# Patient Record
Sex: Female | Born: 1975
Health system: Southern US, Community
[De-identification: ages and names within clinical notes are randomized; demographics above are authoritative.]

## PROBLEM LIST (undated history)

## (undated) DIAGNOSIS — D573 Sickle-cell trait: Secondary | ICD-10-CM

## (undated) DIAGNOSIS — J45909 Unspecified asthma, uncomplicated: Secondary | ICD-10-CM

## (undated) DIAGNOSIS — D500 Iron deficiency anemia secondary to blood loss (chronic): Secondary | ICD-10-CM

## (undated) DIAGNOSIS — D649 Anemia, unspecified: Secondary | ICD-10-CM

## (undated) DIAGNOSIS — M1990 Unspecified osteoarthritis, unspecified site: Secondary | ICD-10-CM

## (undated) DIAGNOSIS — I10 Essential (primary) hypertension: Secondary | ICD-10-CM

## (undated) DIAGNOSIS — T7840XA Allergy, unspecified, initial encounter: Secondary | ICD-10-CM

## (undated) DIAGNOSIS — E669 Obesity, unspecified: Secondary | ICD-10-CM

## (undated) DIAGNOSIS — E88819 Insulin resistance, unspecified: Secondary | ICD-10-CM

## (undated) DIAGNOSIS — D219 Benign neoplasm of connective and other soft tissue, unspecified: Secondary | ICD-10-CM

## (undated) DIAGNOSIS — F32A Depression, unspecified: Secondary | ICD-10-CM

## (undated) DIAGNOSIS — R7303 Prediabetes: Secondary | ICD-10-CM

## (undated) HISTORY — DX: Unspecified osteoarthritis, unspecified site: M19.90

## (undated) HISTORY — DX: Benign neoplasm of connective and other soft tissue, unspecified: D21.9

## (undated) HISTORY — DX: Iron deficiency anemia secondary to blood loss (chronic): D50.0

## (undated) HISTORY — DX: Allergy, unspecified, initial encounter: T78.40XA

## (undated) HISTORY — DX: Unspecified asthma, uncomplicated: J45.909

## (undated) HISTORY — DX: Prediabetes: R73.03

## (undated) HISTORY — DX: Obesity, unspecified: E66.9

## (undated) HISTORY — DX: Anemia, unspecified: D64.9

## (undated) HISTORY — DX: Depression, unspecified: F32.A

## (undated) HISTORY — DX: Essential (primary) hypertension: I10

## (undated) HISTORY — DX: Insulin resistance, unspecified: E88.819

## (undated) HISTORY — PX: OTHER SURGICAL HISTORY: SHX169

## (undated) HISTORY — DX: Sickle-cell trait: D57.3

---

## 1995-11-10 HISTORY — PX: TONSILLECTOMY: SUR1361

## 2000-06-04 ENCOUNTER — Other Ambulatory Visit: Admission: RE | Admit: 2000-06-04 | Discharge: 2000-06-04 | Payer: Self-pay | Admitting: Internal Medicine

## 2001-12-26 ENCOUNTER — Other Ambulatory Visit: Admission: RE | Admit: 2001-12-26 | Discharge: 2001-12-26 | Payer: Self-pay | Admitting: Obstetrics and Gynecology

## 2003-01-12 ENCOUNTER — Other Ambulatory Visit: Admission: RE | Admit: 2003-01-12 | Discharge: 2003-01-12 | Payer: Self-pay | Admitting: Internal Medicine

## 2003-08-10 ENCOUNTER — Other Ambulatory Visit: Admission: RE | Admit: 2003-08-10 | Discharge: 2003-08-10 | Payer: Self-pay | Admitting: Obstetrics and Gynecology

## 2004-01-30 ENCOUNTER — Inpatient Hospital Stay (HOSPITAL_COMMUNITY): Admission: AD | Admit: 2004-01-30 | Discharge: 2004-02-01 | Payer: Self-pay | Admitting: Obstetrics and Gynecology

## 2004-02-02 ENCOUNTER — Encounter: Admission: RE | Admit: 2004-02-02 | Discharge: 2004-03-03 | Payer: Self-pay | Admitting: Obstetrics and Gynecology

## 2004-03-04 ENCOUNTER — Encounter: Admission: RE | Admit: 2004-03-04 | Discharge: 2004-04-03 | Payer: Self-pay | Admitting: Obstetrics and Gynecology

## 2004-03-06 ENCOUNTER — Other Ambulatory Visit: Admission: RE | Admit: 2004-03-06 | Discharge: 2004-03-06 | Payer: Self-pay | Admitting: Obstetrics and Gynecology

## 2009-03-25 ENCOUNTER — Encounter: Payer: Self-pay | Admitting: Gastroenterology

## 2009-11-27 ENCOUNTER — Encounter (INDEPENDENT_AMBULATORY_CARE_PROVIDER_SITE_OTHER): Payer: Self-pay | Admitting: *Deleted

## 2009-11-27 ENCOUNTER — Telehealth: Payer: Self-pay | Admitting: Gastroenterology

## 2009-12-19 ENCOUNTER — Telehealth: Payer: Self-pay | Admitting: Gastroenterology

## 2009-12-19 ENCOUNTER — Ambulatory Visit: Payer: Self-pay | Admitting: Gastroenterology

## 2009-12-19 DIAGNOSIS — R1013 Epigastric pain: Secondary | ICD-10-CM | POA: Insufficient documentation

## 2009-12-20 ENCOUNTER — Ambulatory Visit: Payer: Self-pay | Admitting: Gastroenterology

## 2009-12-25 ENCOUNTER — Encounter: Payer: Self-pay | Admitting: Gastroenterology

## 2009-12-25 ENCOUNTER — Telehealth: Payer: Self-pay | Admitting: Gastroenterology

## 2009-12-25 DIAGNOSIS — R112 Nausea with vomiting, unspecified: Secondary | ICD-10-CM | POA: Insufficient documentation

## 2009-12-25 DIAGNOSIS — R141 Gas pain: Secondary | ICD-10-CM | POA: Insufficient documentation

## 2009-12-25 DIAGNOSIS — R142 Eructation: Secondary | ICD-10-CM

## 2009-12-25 DIAGNOSIS — R143 Flatulence: Secondary | ICD-10-CM

## 2009-12-27 ENCOUNTER — Ambulatory Visit (HOSPITAL_COMMUNITY): Admission: RE | Admit: 2009-12-27 | Discharge: 2009-12-27 | Payer: Self-pay | Admitting: Gastroenterology

## 2009-12-27 ENCOUNTER — Telehealth: Payer: Self-pay | Admitting: Gastroenterology

## 2010-01-14 ENCOUNTER — Ambulatory Visit: Payer: Self-pay | Admitting: Gastroenterology

## 2010-04-16 ENCOUNTER — Emergency Department (HOSPITAL_COMMUNITY): Admission: EM | Admit: 2010-04-16 | Discharge: 2010-04-16 | Payer: Self-pay | Admitting: Emergency Medicine

## 2010-12-09 NOTE — Letter (Signed)
Summary: [facility]  [facility]   Imported By: Sherian Rein 12/23/2009 14:56:38  _____________________________________________________________________  External Attachment:    Type:   Image     Comment:   External Document

## 2010-12-09 NOTE — Procedures (Signed)
Summary: Upper Endoscopy  Patient: Heidi Robinson Note: All result statuses are Final unless otherwise noted.  Tests: (1) Upper Endoscopy (EGD)   EGD Upper Endoscopy       DONE     [facility]     [address]     [place], [place]  [address]           ENDOSCOPY PROCEDURE REPORT           PATIENT:  Heidi, Robinson  MR#:  [id number]     BIRTHDATE:  07/29/1976, 33 yrs. old  GENDER:  female           ENDOSCOPIST:  Barbette Hair. Arlyce Dice, MD     Referred by:           PROCEDURE DATE:  12/20/2009     PROCEDURE:  EGD, diagnostic     ASA CLASS:  Class I     INDICATIONS:  abdominal pain           MEDICATIONS:   Fentanyl 50 mcg IV, Versed 7 mg IV, glycopyrrolate     (Robinal) 0.2 mg IV, 0.6cc simethancone 0.6 cc PO     TOPICAL ANESTHETIC:  Exactacain Spray           DESCRIPTION OF PROCEDURE:   After the risks benefits and     alternatives of the procedure were thoroughly explained, informed     consent was obtained.  The LB GIF-H180 [id number] endoscope was     introduced through the mouth and advanced to the third portion of     the duodenum, without limitations.  The instrument was slowly     withdrawn as the mucosa was fully examined.     <<PROCEDUREIMAGES>>           The upper, middle, and distal third of the esophagus were     carefully inspected and no abnormalities were noted. The z-line     was well seen at the GEJ. The endoscope was pushed into the fundus     which was normal including a retroflexed view. The antrum,gastric     body, first and second part of the duodenum were unremarkable (see     image1, image2, image3, image4, image5, image6, and image7).     Retroflexed views revealed no abnormalities.    The scope was then     withdrawn from the patient and the procedure completed.     COMPLICATIONS:  None           ENDOSCOPIC IMPRESSION:     1) Normal EGD     RECOMMENDATIONS:     1) My office will arrange for you to have an abdominal     ultrasound performed.     2) Call  office next 2-3 days to schedule an office appointment     for 3 weeks     3)  Hyomax as needed for pain           REPEAT EXAM:  No           ______________________________     Barbette Hair. Arlyce Dice, MD           cc: [facility]           n.     eSIGNED:   Barbette Hair. Keela Rubert at 12/20/2009 04:54 PM           Heidi Robinson, [id number]  Note: An exclamation mark (!) indicates a result that was not dispersed into  the flowsheet. Document Creation Date: 12/20/2009 4:54 PM _______________________________________________________________________  (1) Order result status: Final Collection or observation date-time: 12/20/2009 16:50 Requested date-time:  Receipt date-time:  Reported date-time:  Referring Physician:   Ordering Physician: Melvia Heaps [phone]) Specimen Source:  Source: Launa Grill Order Number: [id number] Lab site:

## 2010-12-09 NOTE — Progress Notes (Signed)
Summary: ? about EGD  Phone Note Call from Patient Call back at Home Phone [phone]   Summary of Call: Pt contacted me with concerns of why she has to have an upper endoscopy before the U/S. Explained to her that Dr Arlyce Dice has a concern with her having black stools and her previous hx of asprin use. An EGD will detect if she has an ulcer. If EGD is neg then Dr Arlyce Dice will proceed with the abdominal U/S to rull out gallstones. Told pt to return call if she has any further questions Initial call taken by: Merri Ray CMA Duncan Dull),  December 19, 2009 4:13 PM

## 2010-12-09 NOTE — Letter (Signed)
Summary: Appt Reminder 2  [facility]  [address] [place], [place] [address]   Phone: [phone]  Fax: [phone]        December 25, 2009 MRN: [id number]    [facility] [address] [place], [place]  [address]    Dear Ms. Heidi Robinson,   You have a return appointment with Dr.Robert Arlyce Dice on 01-14-10 at 4:15pm, please arrive by 4pm to register. Please remember to bring a complete list of the medicines you are taking, your insurance card and your co-pay.  If you have to cancel or reschedule this appointment, please call before 5:00 pm the evening before to avoid a cancellation fee.  If you have any questions or concerns, please call [phone].    Sincerely,    Laureen Ochs LPN  Appended Document: Appt Reminder 2 Letter mailed to patient.

## 2010-12-09 NOTE — Letter (Signed)
Summary: EGD Instructions  [facility]  [address] [place], [place] [address]   Phone: [phone]  Fax: [phone]       Heidi Robinson    [birth year]    MRN: [id number]       Procedure Day /Date:FRIDAY 12/20/2009     Arrival Time: 3PM     Procedure Time:4PM     Location of Procedure:                    X  [facility] (4th Floor)    PREPARATION FOR ENDOSCOPY   On 12/20/2009  THE DAY OF THE PROCEDURE:  1.   No solid foods, milk or milk products are allowed after midnight the night before your procedure.  2.   Do not drink anything colored red or purple.  Avoid juices with pulp.  No orange juice.  3.  You may drink clear liquids until 2PM, which is 2 hours before your procedure.                                                                                                CLEAR LIQUIDS INCLUDE: Water Jello Ice Popsicles Tea (sugar ok, no milk/cream) Powdered fruit flavored drinks Coffee (sugar ok, no milk/cream) Gatorade Juice: apple, white grape, white cranberry  Lemonade Clear bullion, consomm, broth Carbonated beverages (any kind) Strained chicken noodle soup Hard Candy   MEDICATION INSTRUCTIONS  Unless otherwise instructed, you should take regular prescription medications with a small sip of water as early as possible the morning of your procedure.            OTHER INSTRUCTIONS  You will need a responsible adult at least 35 years of age to accompany you and drive you home.   This person must remain in the waiting room during your procedure.  Wear loose fitting clothing that is easily removed.  Leave jewelry and other valuables at home.  However, you may wish to bring a book to read or an iPod/MP3 player to listen to music as you wait for your procedure to start.  Remove all body piercing jewelry and leave at home.  Total time from sign-in until discharge is approximately 2-3 hours.  You should go home directly after your  procedure and rest.  You can resume normal activities the day after your procedure.  The day of your procedure you should not:   Drive   Make legal decisions   Operate machinery   Drink alcohol   Return to work  You will receive specific instructions about eating, activities and medications before you leave.    The above instructions have been reviewed and explained to me by   _______________________    I fully understand and can verbalize these instructions _____________________________ Date _________

## 2010-12-09 NOTE — Progress Notes (Signed)
Summary: Ultrasound/REV Scheduled  Phone Note Outgoing Call Call back at [facility] Phone [phone] Call back at [phone]   Call placed by: Laureen Ochs LPN,  December 25, 2009 12:26 PM Call placed to: Patient Summary of Call: Pt. is scheduled for an Ultrasound at [facility] on 12-27-09 at 9am (NPO after 12mn) and an REV on 01-14-10 at 4:15pm. All instructions reviewed w/pt. by phone. Pt. instructed to call back as needed.    Initial call taken by: Laureen Ochs LPN,  December 25, 2009 12:31 PM  New Problems: ABDOMINAL BLOATING (ICD-787.3) NAUSEA WITH VOMITING (ICD-787.01)   New Problems: ABDOMINAL BLOATING (ICD-787.3) NAUSEA WITH VOMITING (ICD-787.01)

## 2010-12-09 NOTE — Letter (Signed)
Summary: New Patient letter  [facility]  [address] [place], [place] [address]   Phone: [phone]  Fax: [phone]       11/27/2009 MRN: [id number]  Heidi Robinson [address] [place], [place]  [address]  Dear Ms. Jerene Pitch,  Welcome to the Gastroenterology Division at [facility].    You are scheduled to see Dr.  Melvia Heaps on December 19, 2009 at 11:00am on the 3rd floor at [facility], [address]  We ask that you try to arrive at our office 15 minutes prior to your appointment time to allow for check-in.  We would like you to complete the enclosed self-administered evaluation form prior to your visit and bring it with you on the day of your appointment.  We will review it with you.  Also, please bring a complete list of all your medications or, if you prefer, bring the medication bottles and we will list them.  Please bring your insurance card so that we may make a copy of it.  If your insurance requires a referral to see a specialist, please bring your referral form from your primary care physician.  Co-payments are due at the time of your visit and may be paid by cash, check or credit card.     Your office visit will consist of a consult with your physician (includes a physical exam), any laboratory testing he/she may order, scheduling of any necessary diagnostic testing (e.g. x-ray, ultrasound, CT-scan), and scheduling of a procedure (e.g. Endoscopy, Colonoscopy) if required.  Please allow enough time on your schedule to allow for any/all of these possibilities.    If you cannot keep your appointment, please call [phone] to cancel or reschedule prior to your appointment date.  This allows [place] the opportunity to schedule an appointment for another patient in need of care.  If you do not cancel or reschedule by 5 p.m. the business day prior to your appointment date, you will be charged a $50.00 late cancellation/no-show fee.    Thank you for choosing  [facility] for your medical needs.  We appreciate the opportunity to care for you.  Please visit [place] at our website  to learn more about our practice.                     Sincerely,                                                             The Gastroenterology Division

## 2010-12-09 NOTE — Progress Notes (Signed)
Summary: Previous GI hx  Phone Note Call from Patient Call back at [facility] Phone [phone]   Summary of Call: Wants appointment, has GI HX with Dr Elnoria Howard. Pt not happy with care at [facility] Initial call taken by: Merri Ray CMA Duncan Dull),  November 27, 2009 3:47 PM  Follow-up for Phone Call        She needs an office appointment  ---- 11/27/2009 12:30 PM, Merri Ray CMA (AAMA) wrote: Dr Arlyce Dice, There is a pt who has not been registered yet. She has seen Dr Elnoria Howard in the office  on May 2010,he wanted to schedule her a EGD with [facility] but she didnt go back. She stated she was not happy with her care there. Do you want her scheduled with you??  Or is it Ok??  Additional Follow-up for Phone Call Additional follow up Details #1::        Ok pt scheduled for 12/19/2009 Additional Follow-up by: Merri Ray CMA Duncan Dull),  November 27, 2009 3:48 PM

## 2010-12-09 NOTE — Progress Notes (Signed)
Summary: Ultrasound Results  ---- Converted from flag ---- ---- 12/27/2009 10:49 AM, Louis Meckel MD wrote: Please tell pt ulrasound is negative.  She should use hyomax aggressively for her abdominal discomfort and ROV in 3-4 weeks unless she's no better. ------------------------------  Phone Note Outgoing Call   Call placed by: Laureen Ochs LPN,  December 27, 2009 11:53 AM Call placed to: Patient Summary of Call: Above MD orders reviewed with patient. Pt. to keep scheduled office visit 01-14-10 at 4:15pm. Pt. instructed to call back as needed.  Initial call taken by: Laureen Ochs LPN,  December 27, 2009 11:54 AM

## 2010-12-09 NOTE — Assessment & Plan Note (Signed)
Summary: F/U FROM ENDO & ULTRASOUND      4:00appt      DEBORAH   History of Present Illness Primary GI MD: Melvia Heaps MD [facility] Primary Provider: [facility] Chief Complaint: Rondell Reams and abd pain still ongoing with constipation. F/u from u/s and EGD. History of Present Illness:   Ms. Offner has returned for followup of her  abdominal complaints including bloating, fullness and excess belching.  Hyomax did not impact on the symptoms.  She tends to be constipated and may have a bowel movement every 3 days.  She has a sense of incomplete evacuation.  With a complete bowel movement her bloating nausea and fullness may subside.  ]Upper endoscopy and ultrasound negative.   GI Review of Systems    Reports abdominal pain and  nausea.     Location of  Abdominal pain: generalized.    Denies acid reflux, belching, bloating, chest pain, dysphagia with liquids, dysphagia with solids, heartburn, loss of appetite, vomiting, vomiting blood, weight loss, and  weight gain.      Reports constipation.     Denies anal fissure, black tarry stools, change in bowel habit, diarrhea, diverticulosis, fecal incontinence, heme positive stool, hemorrhoids, irritable bowel syndrome, jaundice, light color stool, liver problems, rectal bleeding, and  rectal pain.    Current Medications (verified): 1)  Yasmin 28 3-0.03 Mg Tabs (Drospirenone-Ethinyl Estradiol) .... Take One By Mouth Once Daily 2)  Amlodipine Besylate 5 Mg Tabs (Amlodipine Besylate) .... Take One By Mouth Once Daily 3)  Prune X .... Take One By Mouth As Needed 4)  Multivitamins  Tabs (Multiple Vitamin) .... Take One By Mouth Once Daily 5)  Prilosec Otc 20 Mg Tbec (Omeprazole Magnesium) .... Take One By Mouth Once Daily 6)  Hyomax-Sl 0.125 Mg Subl (Hyoscyamine Sulfate) .... Take 2 Tablets Sublingual Q.4 H. P.r.n.  Allergies (verified): 1)  ! Pcn  Past History:  Past Medical History: Reviewed history from 12/19/2009 and no changes  required. GERD migraines Asthma hx of chicken pox Hypertension  Past Surgical History: Reviewed history from 12/17/2009 and no changes required. Tonsillectomy  Family History: Reviewed history from 12/19/2009 and no changes required. Family History of Prostate : Cancer: paternal grandfather Family History of Leukemia: maternal Great Grandmother Family History of Lung Cancer: Maternal Aunt (smoker)  Social History: Reviewed history from 12/19/2009 and no changes required.  married Illicit Drug Use - no 2 children Occupation: Nurse, children's  Patient has never smoked.  Alcohol Use - no Daily Caffeine Use one per day  Review of Systems  The patient denies allergy/sinus, anemia, anxiety-new, arthritis/joint pain, back pain, blood in urine, breast changes/lumps, change in vision, confusion, cough, coughing up blood, depression-new, fainting, fatigue, fever, headaches-new, hearing problems, heart murmur, heart rhythm changes, itching, menstrual pain, muscle pains/cramps, night sweats, nosebleeds, pregnancy symptoms, shortness of breath, skin rash, sleeping problems, sore throat, swelling of feet/legs, swollen lymph glands, thirst - excessive , urination - excessive , urination changes/pain, urine leakage, vision changes, and voice change.    Vital Signs:  Patient profile:   35 year old female Height:      62 inches Weight:      167.25 pounds BMI:     30.70 Pulse rate:   80 / minute Pulse rhythm:   regular BP sitting:   124 / 78  (left arm) Cuff size:   regular  Vitals Entered By: Christie Nottingham CMA Duncan Dull) (January 14, 2010 4:10 PM)   Impression & Recommendations:  Problem #  1:  ABDOMINAL BLOATING (ICD-787.3) Constellation of symptoms including bloating, fullness and nausea may be due to underlying constipation.  GERD maybe operative as well.  Recommendations #1 trial of AcipHex 20 mg a day #2 patient was instructed to take MiraLax every 3 days as needed  Patient  Instructions: 1)  We are giving you Aciphex samples today 2)  The medication list was reviewed and reconciled.  All changed / newly prescribed medications were explained.  A complete medication list was provided to the patient / caregiver.

## 2010-12-09 NOTE — Assessment & Plan Note (Signed)
Summary: nausea,reflux ,vomitting....em   History of Present Illness Visit Type: Initial Visit Primary GI MD: Melvia Heaps MD [facility] Primary Provider: [facility] Chief Complaint: Patient having some epigastric pain and nausea. She does complain of vomiting from time to time. She complains that she has dark tarry stool when she s constipated, she has had them for about a year.  History of Present Illness:   Heidi Robinson is a 35 year old Afro-American female referred from the  urgent care center for evaluation of abdominal pain.  Over the past 2 years she has been suffering from intermittent severe with episodes of pain, nausea and vomiting.  She describes a feeling of an intense desire to belch or pass gas.  She has bloating and fullness in her upper abdomen which can last up to a couple of days.  It is nonspecific to any particular foods.  She has occasional pyrosis.  She takes over-the-counter Prilosec which has decreased the frequency of these episodes though they remain.  She denies dysphagia.  She claims that she has had black solid stools.  She had been taking Excedrin migraine tablets but none in the last couple of weeks.   GI Review of Systems    Reports abdominal pain, acid reflux, belching, bloating, chest pain, nausea, and  vomiting.     Location of  Abdominal pain: generalized.    Denies dysphagia with liquids, dysphagia with solids, heartburn, loss of appetite, vomiting blood, weight loss, and  weight gain.      Reports black tarry stools and  constipation.     Denies anal fissure, change in bowel habit, diarrhea, diverticulosis, fecal incontinence, heme positive stool, hemorrhoids, irritable bowel syndrome, jaundice, light color stool, liver problems, rectal bleeding, and  rectal pain.    Current Medications (verified): 1)  Yasmin 28 3-0.03 Mg Tabs (Drospirenone-Ethinyl Estradiol) .... Take One By Mouth Once Daily 2)  Amlodipine Besylate 5 Mg Tabs (Amlodipine Besylate) .... Take  One By Mouth Once Daily 3)  Prune X .... Take One By Mouth As Needed 4)  Multivitamins  Tabs (Multiple Vitamin) .... Take One By Mouth Once Daily 5)  Prilosec Otc 20 Mg Tbec (Omeprazole Magnesium) .... Take One By Mouth Once Daily  Allergies (verified): 1)  ! Pcn  Past History:  Past Medical History: GERD migraines Asthma hx of chicken pox Hypertension  Past Surgical History: Reviewed history from 12/17/2009 and no changes required. Tonsillectomy  Family History: Family History of Prostate : Cancer: paternal grandfather Family History of Leukemia: maternal Great Grandmother Family History of Lung Cancer: Maternal Aunt (smoker)  Social History:  married Illicit Drug Use - no 2 children Occupation: Nurse, children's  Patient has never smoked.  Alcohol Use - no Daily Caffeine Use one per day  Review of Systems       The patient complains of headaches-new.  The patient denies allergy/sinus, anemia, anxiety-new, arthritis/joint pain, back pain, blood in urine, breast changes/lumps, change in vision, confusion, cough, coughing up blood, depression-new, fainting, fatigue, fever, hearing problems, heart murmur, heart rhythm changes, itching, menstrual pain, muscle pains/cramps, night sweats, nosebleeds, pregnancy symptoms, shortness of breath, skin rash, sleeping problems, sore throat, swelling of feet/legs, swollen lymph glands, thirst - excessive , urination - excessive , urination changes/pain, urine leakage, vision changes, and voice change.         All other systems were reviewed and were negative   Vital Signs:  Patient profile:   35 year old female Height:  62 inches Weight:      166.4 pounds BMI:     30.54 Pulse rate:   84 / minute Pulse rhythm:   regular BP sitting:   130 / 86  (left arm) Cuff size:   regular  Vitals Entered By: Harlow Mares CMA Duncan Dull) (December 19, 2009 11:10 AM)  Physical Exam  Additional Exam:  She is a healthy-appearing female  skin:  anicteric HEENT: normocephalic; PEERLA; no nasal or pharyngeal abnormalities neck: supple nodes: no cervical lymphadenopathy chest: clear to ausculatation and percussion heart: no murmurs, gallops, or rubs abd: soft, nontender; BS normoactive; no abdominal masses, tenderness, organomegaly rectal: deferred ext: no cynanosis, clubbing, edema skeletal: no deformities neuro: oriented x 3; no focal abnormalities    Impression & Recommendations:  Problem # 1:  ABDOMINAL PAIN, EPIGASTRIC (ICD-789.06)  Her episodes of pain could be due to ulcer or nonulcer dyspepsia.  History of black stools raises the question of active ulcer disease, especially in view of her history of aspirin use.  Pain from acute cholecystitis is also a consideration.  Recommendations #1 trial hyomax sublingual #2 upper endoscopy #3 abdominal ultrasound if endoscopy is negative   Risks, alternatives, and complications of the procedure, including bleeding, perforation, and possible need for surgery, were explained to the patient.  Patient's questions were answered.  Orders: EGD (EGD)  Patient Instructions: 1)  Conscious Sedation brochure given.  2)  Upper Endoscopy brochure given.  3)  Your EGD is scheduled for 12/20/2009 at 4pm 4)  Your new rx is being sent to your pharmacy today 5)  The medication list was reviewed and reconciled.  All changed / newly prescribed medications were explained.  A complete medication list was provided to the patient / caregiver. Prescriptions: HYOMAX-SL 0.125 MG SUBL (HYOSCYAMINE SULFATE) take 2 tablets sublingual q.4 h. p.r.n.  #15 x 2   Entered and Authorized by:   Louis Meckel MD   Signed by:   Louis Meckel MD on 12/19/2009   Method used:   Electronically to        [facility] (retail)       [address]       [place], [place]  [address]       Ph: [phone]       Fax: [phone]   RxID:   [id number]

## 2010-12-09 NOTE — Letter (Signed)
Summary: Results Letter  [facility]  [address] [place], [place] [address]   Phone: [phone]  Fax: [phone]        December 19, 2009 MRN: [id number]    [facility] [address] [place], [place]  [address]    Dear Ms. COZBY,  It is my pleasure to have treated you recently as a new patient in my office. I appreciate your confidence and the opportunity to participate in your care.  Since I do have a busy inpatient endoscopy schedule and office schedule, my office hours vary weekly. I am, however, available for emergency calls everyday through my office. If I am not available for an urgent office appointment, another one of our gastroenterologist will be able to assist you.  My well-trained staff are prepared to help you at all times. For emergencies after office hours, a physician from our Gastroenterology section is always available through my 24 hour answering service  Once again I welcome you as a new patient and I look forward to a happy and healthy relationship             Sincerely,  Louis Meckel MD  This letter has been electronically signed by your physician.  Appended Document: Results Letter letter mailed

## 2011-02-24 ENCOUNTER — Encounter: Payer: 59 | Attending: Family Medicine | Admitting: *Deleted

## 2011-02-24 DIAGNOSIS — Z713 Dietary counseling and surveillance: Secondary | ICD-10-CM | POA: Insufficient documentation

## 2011-02-24 DIAGNOSIS — E669 Obesity, unspecified: Secondary | ICD-10-CM | POA: Insufficient documentation
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
## 2012-01-11 ENCOUNTER — Other Ambulatory Visit: Payer: Self-pay | Admitting: Family Medicine

## 2012-02-02 ENCOUNTER — Ambulatory Visit (INDEPENDENT_AMBULATORY_CARE_PROVIDER_SITE_OTHER): Payer: 59 | Admitting: Family Medicine

## 2012-02-02 ENCOUNTER — Encounter: Payer: Self-pay | Admitting: Family Medicine

## 2012-02-02 VITALS — BP 104/78 | HR 70 | Temp 98.1°F | Resp 16 | Ht 62.0 in | Wt 192.2 lb

## 2012-02-02 DIAGNOSIS — E010 Iodine-deficiency related diffuse (endemic) goiter: Secondary | ICD-10-CM

## 2012-02-02 DIAGNOSIS — E669 Obesity, unspecified: Secondary | ICD-10-CM

## 2012-02-02 DIAGNOSIS — E049 Nontoxic goiter, unspecified: Secondary | ICD-10-CM

## 2012-02-02 DIAGNOSIS — Z0000 Encounter for general adult medical examination without abnormal findings: Secondary | ICD-10-CM

## 2012-02-02 DIAGNOSIS — I10 Essential (primary) hypertension: Secondary | ICD-10-CM

## 2012-02-02 HISTORY — DX: Obesity, unspecified: E66.9

## 2012-02-02 LAB — POCT URINALYSIS DIPSTICK
Glucose, UA: NEGATIVE
Urobilinogen, UA: 0.2
pH, UA: 7

## 2012-02-02 MED ORDER — LISINOPRIL-HYDROCHLOROTHIAZIDE 10-12.5 MG PO TABS: 1.0000 | ORAL_TABLET | Freq: Every day | ORAL | Status: DC

## 2012-02-02 NOTE — Progress Notes (Signed)
Subjective:    Patient ID: Heidi Robinson, female    DOB: Feb 03, 1976, 36 y.o.   MRN: [id number]  HPI This 36 y.o. AA female presents for annual CPE; she had her GYN exam with Dr. Rosemary Holms in Oct 2012.  Her main focus is weight reduction;  She is using Herb-A-Life shakes for several months and has had some   weight loss. She exercises twice a week for 1 hour and plans to increase that to 3x weekly. She did receive  [facility] about 1 year ago.      She was last seen in Aug 2012 by Dr. Neva Seat because of left shoulder pain which has not completely resolved  She is managing the discomfort by limiting ROM and taking Ibuprofen as needed. The area is aggravated by   the tension of her bra strap. Topical analgesic [facility]) seems to help.      She has HTN which is well controlled with Lisinopril-HCTZ taken daily.    Review of Systems  Constitutional: Negative.   Eyes: Negative for visual disturbance.  Respiratory: Negative for cough, chest tightness and shortness of breath.   Cardiovascular: Positive for leg swelling. Negative for chest pain and palpitations.  Neurological: Negative.   All other systems reviewed and are negative.       Objective:   Physical Exam  Nursing note and vitals reviewed. Constitutional: She is oriented to person, place, and time. She appears well-developed and well-nourished. No distress.  HENT:  Head: Normocephalic and atraumatic.  Right Ear: External ear normal.  Left Ear: External ear normal.  Nose: Nose normal.  Mouth/Throat: Oropharynx is clear and moist.  Eyes: Conjunctivae and EOM are normal. Pupils are equal, round, and reactive to light. No scleral icterus.  Neck: Normal range of motion. Neck supple. Thyromegaly present.  Cardiovascular: Normal rate, regular rhythm and normal heart sounds.  Exam reveals no gallop and no friction rub.   No murmur heard. Pulmonary/Chest: Effort normal and breath sounds  normal. No respiratory distress.       Diminished BS at bases  Abdominal: Soft. Bowel sounds are normal. She exhibits no mass. There is no tenderness.  Musculoskeletal: Normal range of motion. She exhibits no edema.       Left shoulder tenderness over medial aspect of supraspinatous muscle; normal muscle strength in deltoid muscles  Lymphadenopathy:    She has no cervical adenopathy.  Neurological: She is alert and oriented to person, place, and time. She has normal reflexes. No cranial nerve deficit. Coordination normal.  Skin: Skin is warm and dry.  Psychiatric: She has a normal mood and affect. Her behavior is normal. Judgment and thought content normal.     Results for orders placed in visit on 02/02/12  POCT URINALYSIS DIPSTICK      Component Value Range   Color, UA yellow     Clarity, UA clear     Glucose, UA negative     Bilirubin, UA negative     Ketones, UA negative     Spec Grav, UA 1.015     Blood, UA negative     pH, UA 7.0     Protein, UA negative     Urobilinogen, UA 0.2     Nitrite, UA negative     Leukocytes, UA small (1+)            Assessment & Plan:   1. HTN (hypertension)  RF: Lisinopril-HCTZ  10-12.5 mg  1 tab daily   #90/ 3RF Check BMET  2. Obesity  Encouraged adequate caloric intake (do not skip meals!) and increase exercise to 3x weekly consistently  3. Routine general medical examination at a health care facility    4. Thyromegaly  TSH, Free T4; Vit D level pending

## 2012-02-02 NOTE — Patient Instructions (Signed)
For advise about weight reduction and changing what foods you are consuming- JJ Virgin has some info that may be available through [facility] website.Keeping You Healthy  Get These Tests 1. Blood Pressure- Have your blood pressure checked once a year by your health care provider.  Normal blood pressure is 120/80. 2. Weight- Have your body mass index (BMI) calculated to screen for obesity.  BMI is measure of body fat based on height and weight.  You can also calculate your own BMI at https://www.west-esparza.com/. 3. Cholesterol- Have your cholesterol checked every 5 years starting at age 83 then yearly starting at age 46. 4. Chlamydia, HIV, and other sexually transmitted diseases- Get screened every year until age 68, then within three months of each new sexual provider. 5. Pap Smear- Every 1-3 years; discuss with your health care provider. 6. Mammogram- Every year starting at age 13  Take these medicines  Calcium with Vitamin D-Your body needs 1200 mg of Calcium each day and [phone] IU of Vitamin D daily.  Your body can only absorb 500 mg of Calcium at a time so Calcium must be taken in 2 or 3 divided doses throughout the day.  Multivitamin with folic acid- Once daily if it is possible for you to become pregnant.  Get these Immunizations  Gardasil-Series of three doses; prevents HPV related illness such as genital warts and cervical cancer.  Menactra-Single dose; prevents meningitis.  Tetanus shot- Every 10 years.  Flu shot-Every year.  Take these steps 1. Do not smoke-Your healthcare provider can help you quit.  For tips on how to quit go to www.smokefree.gov or call 1-800 QUITNOW. 2. Be physically active- Exercise 5 days a week for at least 30 minutes.  If you are not already physically active, start slow and gradually work up to 30 minutes of moderate physical activity.  Examples of moderate activity include walking briskly, dancing, swimming, bicycling, etc. 3. Breast Cancer- A self breast  exam every month is important for early detection of breast cancer.  For more information and instruction on self breast exams, ask your healthcare provider or [email or web address].[email or web address]. 4. Eat a healthy diet- Eat a variety of healthy foods such as fruits, vegetables, whole grains, low fat milk, low fat cheeses, yogurt, lean meats, poultry and fish, beans, nuts, tofu, etc.  For more information go to www. Thenutritionsource.org 5. Drink alcohol in moderation- Limit alcohol intake to one drink or less per day. Never drink and drive. 6. Depression- Your emotional health is as important as your physical health.  If you're feeling down or losing interest in things you normally enjoy please talk to your healthcare provider about being screened for depression. 7. Dental visit- Brush and floss your teeth twice daily; visit your dentist twice a year. 8. Eye doctor- Get an eye exam at least every 2 years. 9. Helmet use- Always wear a helmet when riding a bicycle, motorcycle, rollerblading or skateboarding. 10. Safe sex- If you may be exposed to sexually transmitted infections, use a condom. 11. Seat belts- Seat belts can save your live; always wear one. 12. Smoke/Carbon Monoxide detectors- These detectors need to be installed on the appropriate level of your home. Replace batteries at least once a year. 13. Skin cancer- When out in the sun please cover up and use sunscreen 15 SPF or higher. 14. Violence- If anyone is threatening or hurting you, please tell your healthcare provider.

## 2012-02-03 LAB — TSH: TSH: 1.592 u[IU]/mL (ref 0.350–4.500)

## 2012-02-03 LAB — BASIC METABOLIC PANEL
CO2: 26 mEq/L (ref 19–32)
Creat: 0.8 mg/dL (ref 0.50–1.10)
Glucose, Bld: 80 mg/dL (ref 70–99)

## 2012-02-10 NOTE — Progress Notes (Signed)
Quick Note:  Please notify pt that results are normal.   Provide pt with copy of labs. ______ 

## 2012-06-07 ENCOUNTER — Telehealth: Payer: Self-pay

## 2012-06-07 DIAGNOSIS — Z13220 Encounter for screening for lipoid disorders: Secondary | ICD-10-CM

## 2012-06-07 NOTE — Telephone Encounter (Signed)
LMOM for pt to CB about her form (at [employer] desk). Form is complete except it looks like no cholesterol test was done in March. Does pt want [place] to fax it in as is, or does she want to RTC for chol test so that it can be filled in?

## 2012-06-07 NOTE — Telephone Encounter (Signed)
Pt CB and stated that the cholesterol results are needed on her Biometric form, but she was surprised that this was not run at time of PE. Dr Audria Nine, can you order the lab to be done w/out pt being seen?

## 2012-06-08 NOTE — Telephone Encounter (Signed)
Yes, I will order it and pt can come in within next 2 weeks.

## 2012-06-08 NOTE — Telephone Encounter (Signed)
LMOM for pt that Dr Audria Nine has put order in for lab so that she can come in for "lab only" visit and will not have to wait to see provider. Chart w/form is still at [facility] desk so that it can be completed w/cholesterol results after done.

## 2012-06-14 NOTE — Telephone Encounter (Signed)
Pt has not come in yet for lab so will place form in her paper chart [phone] and file back so that it can be easily found when pt comes in.

## 2012-06-19 ENCOUNTER — Other Ambulatory Visit (INDEPENDENT_AMBULATORY_CARE_PROVIDER_SITE_OTHER): Payer: 59 | Admitting: Family Medicine

## 2012-06-19 VITALS — BP 119/78 | HR 78 | Temp 98.7°F | Resp 16 | Ht 62.25 in | Wt 213.6 lb

## 2012-06-19 DIAGNOSIS — Z13220 Encounter for screening for lipoid disorders: Secondary | ICD-10-CM

## 2012-06-19 DIAGNOSIS — E782 Mixed hyperlipidemia: Secondary | ICD-10-CM

## 2012-06-19 LAB — LIPID PANEL
Cholesterol: 142 mg/dL (ref 0–200)
HDL: 42 mg/dL (ref >39)
LDL Cholesterol: 81 mg/dL (ref 0–99)
Total CHOL/HDL Ratio: 3.4 Ratio
Triglycerides: 94 mg/dL (ref <150)
VLDL: 19 mg/dL (ref 0–40)

## 2012-06-22 NOTE — Progress Notes (Signed)
Quick Note:  Please notify pt that results are normal.   Provide pt with copy of labs. ______ 

## 2012-06-23 ENCOUNTER — Encounter: Payer: Self-pay | Admitting: Radiology

## 2012-06-27 ENCOUNTER — Encounter: Payer: Self-pay | Admitting: Family Medicine

## 2012-06-27 ENCOUNTER — Telehealth: Payer: Self-pay

## 2012-06-27 NOTE — Telephone Encounter (Signed)
Britta Mccreedy,  I don't see where form was ever sent in on this patient, correct?  Did we just need to put in LDL?  I put chart on your desk for you to advise.

## 2012-06-27 NOTE — Telephone Encounter (Signed)
Pt asking for status on biometric form. Pt seen in early august and came back for a lab she was told was needed to fill out form. Pt states she asked for it to be faxed and the fax number is on back of the form.  Pt best: [phone]  bf

## 2012-06-28 NOTE — Telephone Encounter (Signed)
Filled in LDL on form so that it is complete, and faxed biometric form. Received confirmation form went through and notified pt.

## 2013-02-03 ENCOUNTER — Other Ambulatory Visit: Payer: Self-pay | Admitting: Family Medicine

## 2013-03-22 ENCOUNTER — Other Ambulatory Visit: Payer: Self-pay | Admitting: Family Medicine

## 2013-03-22 NOTE — Telephone Encounter (Signed)
2nd notice - overdue for OV, labs

## 2013-05-01 ENCOUNTER — Telehealth: Payer: Self-pay

## 2013-05-01 NOTE — Telephone Encounter (Signed)
Pt states that [facility] on [address] sent over a request for lisinopril a few days ago but they have not yet received a response. I did let pt know that she had not been in the office in over a year and may be due for an OV pt states that she has an appt on May 19, 2013.  BEST# [phone]

## 2013-05-02 MED ORDER — LISINOPRIL-HYDROCHLOROTHIAZIDE 10-12.5 MG PO TABS: 1.0000 | ORAL_TABLET | Freq: Every day | ORAL | Status: DC

## 2013-05-02 NOTE — Telephone Encounter (Signed)
faxed

## 2013-05-19 ENCOUNTER — Encounter: Payer: Self-pay | Admitting: Family Medicine

## 2013-05-19 ENCOUNTER — Ambulatory Visit (INDEPENDENT_AMBULATORY_CARE_PROVIDER_SITE_OTHER): Payer: 59 | Admitting: Family Medicine

## 2013-05-19 VITALS — BP 120/84 | HR 82 | Temp 98.8°F | Resp 16 | Ht 62.0 in | Wt 216.8 lb

## 2013-05-19 DIAGNOSIS — Z0000 Encounter for general adult medical examination without abnormal findings: Secondary | ICD-10-CM

## 2013-05-19 DIAGNOSIS — N644 Mastodynia: Secondary | ICD-10-CM

## 2013-05-19 DIAGNOSIS — I10 Essential (primary) hypertension: Secondary | ICD-10-CM

## 2013-05-19 DIAGNOSIS — Z23 Encounter for immunization: Secondary | ICD-10-CM

## 2013-05-19 DIAGNOSIS — E669 Obesity, unspecified: Secondary | ICD-10-CM

## 2013-05-19 DIAGNOSIS — Z79899 Other long term (current) drug therapy: Secondary | ICD-10-CM

## 2013-05-19 LAB — GLUCOSE, POCT (MANUAL RESULT ENTRY): POC Glucose: 86 mg/dl (ref 70–99)

## 2013-05-19 MED ORDER — ALBUTEROL SULFATE HFA 108 (90 BASE) MCG/ACT IN AERS: 2.0000 | INHALATION_SPRAY | RESPIRATORY_TRACT | Status: DC | PRN

## 2013-05-19 MED ORDER — LISINOPRIL-HYDROCHLOROTHIAZIDE 10-12.5 MG PO TABS: 1.0000 | ORAL_TABLET | Freq: Every day | ORAL | Status: DC

## 2013-05-19 NOTE — Patient Instructions (Addendum)
Exercise to Lose Weight Exercise and a healthy diet may help you lose weight. Your doctor may suggest specific exercises. EXERCISE IDEAS AND TIPS  Choose low-cost things you enjoy doing, such as walking, bicycling, or exercising to workout videos.  Take stairs instead of the elevator.  Walk during your lunch break.  Park your car further away from work or school.  Go to a gym or an exercise class.  Start with 5 to 10 minutes of exercise each day. Build up to 30 minutes of exercise 4 to 6 days a week.  Wear shoes with good support and comfortable clothes.  Stretch before and after working out.  Work out until you breathe harder and your heart beats faster.  Drink extra water when you exercise.  Do not do so much that you hurt yourself, feel dizzy, or get very short of breath. Exercises that burn about 150 calories:  Running 1  miles in 15 minutes.  Playing volleyball for 45 to 60 minutes.  Washing and waxing a car for 45 to 60 minutes.  Playing touch football for 45 minutes.  Walking 1  miles in 35 minutes.  Pushing a stroller 1  miles in 30 minutes.  Playing basketball for 30 minutes.  Raking leaves for 30 minutes.  Bicycling 5 miles in 30 minutes.  Walking 2 miles in 30 minutes.  Dancing for 30 minutes.  Shoveling snow for 15 minutes.  Swimming laps for 20 minutes.  Walking up stairs for 15 minutes.  Bicycling 4 miles in 15 minutes.  Gardening for 30 to 45 minutes.  Jumping rope for 15 minutes.  Washing windows or floors for 45 to 60 minutes. Document Released: 11/28/2010 Document Revised: 01/18/2012 Document Reviewed: 11/28/2010 ExitCare Patient Information 2014 [employer]. Calorie Counting Diet A calorie counting diet requires you to eat the number of calories that are right for you in a day. Calories are the measurement of how much energy you get from the food you eat. Eating the right amount of calories is important for staying at a  healthy weight. If you eat too many calories, your body will store them as fat and you may gain weight. If you eat too few calories, you may lose weight. Counting the number of calories you eat during a day will help you know if you are eating the right amount. A Registered Dietitian can determine how many calories you need in a day. The amount of calories needed varies from person to person. If your goal is to lose weight, you will need to eat fewer calories. Losing weight can benefit you if you are overweight or have health problems such as heart disease, high blood pressure, or diabetes. If your goal is to gain weight, you will need to eat more calories. Gaining weight may be necessary if you have a certain health problem that causes your body to need more energy. TIPS Whether you are increasing or decreasing the number of calories you eat during a day, it may be hard to get used to changes in what you eat and drink. The following are tips to help you keep track of the number of calories you eat.  Measure foods at home with measuring cups. This helps you know the amount of food and number of calories you are eating.  Restaurants often serve food in amounts that are larger than 1 serving. While eating out, estimate how many servings of a food you are given. For example, a serving of cooked rice   is  cup or about the size of half of a fist. Knowing serving sizes will help you be aware of how much food you are eating at restaurants.  Ask for smaller portion sizes or child-size portions at restaurants.  Plan to eat half of a meal at a restaurant. Take the rest home or share the other half with a friend.  Read the Nutrition Facts panel on food labels for calorie content and serving size. You can find out how many servings are in a package, the size of a serving, and the number of calories each serving has.  For example, a package might contain 3 cookies. The Nutrition Facts panel on that package says  that 1 serving is 1 cookie. Below that, it will say there are 3 servings in the container. The calories section of the Nutrition Facts label says there are 90 calories. This means there are 90 calories in 1 cookie (1 serving). If you eat 1 cookie you have eaten 90 calories. If you eat all 3 cookies, you have eaten 270 calories (3 servings x 90 calories = 270 calories). The list below tells you how big or small some common portion sizes are.  1 oz.........4 stacked dice.  3 oz.........Deck of cards.  1 tsp........Tip of little finger.  1 tbs........Thumb.  2 tbs........Golf ball.   cup.......Half of a fist.  1 cup........A fist. KEEP A FOOD LOG Write down every food item you eat, the amount you eat, and the number of calories in each food you eat during the day. At the end of the day, you can add up the total number of calories you have eaten. It may help to keep a list like the one below. Find out the calorie information by reading the Nutrition Facts panel on food labels. Breakfast  Bran cereal (1 cup, 110 calories).  Fat-free milk ( cup, 45 calories). Snack  Apple (1 medium, 80 calories). Lunch  Spinach (1 cup, 20 calories).  Tomato ( medium, 20 calories).  Chicken breast strips (3 oz, 165 calories).  Shredded cheddar cheese ( cup, 110 calories).  Light Italian dressing (2 tbs, 60 calories).  Whole-wheat bread (1 slice, 80 calories).  Tub margarine (1 tsp, 35 calories).  Vegetable soup (1 cup, 160 calories). Dinner  Pork chop (3 oz, 190 calories).  Brown rice (1 cup, 215 calories).  Steamed broccoli ( cup, 20 calories).  Strawberries (1  cup, 65 calories).  Whipped cream (1 tbs, 50 calories). Daily Calorie Total: 1425 Document Released: 10/26/2005 Document Revised: 01/18/2012 Document Reviewed: 04/22/2007 ExitCare Patient Information 2014 [employer].  

## 2013-05-19 NOTE — Progress Notes (Signed)
Subjective:    Patient ID: Heidi Robinson, female    DOB: Apr 28, 1976, 37 y.o.   MRN: [id number]  HPI  Will drop off biometric screening form for completion.  She ate breakfast at 8 a.m. But has been fasting since and it is now 5:30 p.m.  Periods regular, not heavy, painful.   Does not check BP outside of the office.  Asthma only flairs during allergy season.   Working out 2-3x/wk 30 min/d - but has gained 50 lbs in the past 3 yrs.  Before was exercising more - is a member at [employer] and watching diet but never w/ any benefit.  Still gains weight. Tried phentermine once from her gynecologist but made her feel really anxious, palpitations - felt horrible.  Breasts keep growing. Occ tender - more often.  No nipple discharge.   Getting back pain - 44 DD.  Dr. Rosemary Holms is gynecologist - had nml pap on Nov 13 - had abnormal about 5 yrs ago but no colpo.   Pt has a 37 yo and a 37 yo.  Doesn't think she wants anymore children but hasn't completely ruled it out yet.  Using condoms for birthcontrol.  Past Medical History  Diagnosis Date  . Asthma    Current Outpatient Prescriptions on File Prior to Visit  Medication Sig Dispense Refill  . ALBUTEROL IN Inhale into the lungs as needed.      [name] lisinopril-hydrochlorothiazide (PRINZIDE,ZESTORETIC) 10-12.5 MG per tablet Take 1 tablet by mouth daily. NEED VISIT, LABS!! - 3rd /final notice  30 tablet  0  . Multiple Vitamin (MULTIVITAMINS PO) Take by mouth.       No current facility-administered medications on file prior to visit.   Allergies  Allergen Reactions  . Penicillins Anaphylaxis   Family History  Problem Relation Age of Onset  . Hypertension Mother   . Kidney disease Brother   . Hypertension Maternal Grandmother    Past Surgical History  Procedure Laterality Date  . Tonsillecomy    . Tonsillectomy     History   Social History  . Marital Status: Married    Spouse Name: N/A    Number of Children: N/A  . Years of Education:  N/A   Occupational History  . Project Mgr    Social History Main Topics  . Smoking status: Former Smoker -- 2 years    Types: Cigarettes    Quit date: 07/11/2003  . Smokeless tobacco: None  . Alcohol Use: No  . Drug Use: No  . Sexually Active: None   Other Topics Concern  . None   Social History Narrative   Married with 2 children. Patient has a Naval architect. Exercise: Cardio 2-3 times a week for 30 minutes.     Review of Systems  Constitutional: Positive for unexpected weight change.  HENT: Negative.   Eyes: Negative.   Respiratory: Negative.   Cardiovascular: Negative.   Gastrointestinal: Negative.   Endocrine: Negative.   Genitourinary: Negative.   Musculoskeletal: Positive for myalgias and back pain.  Skin: Negative.   Allergic/Immunologic: Negative.   Neurological: Negative.   Hematological: Negative.   Psychiatric/Behavioral: Negative.       BP 120/84  Pulse 82  Temp(Src) 98.8 F (37.1 C) (Oral)  Resp 16  Ht 5\' 2"  (1.575 m)  Wt 216 lb 12.8 oz (98.34 kg)  BMI 39.64 kg/m2  SpO2 100%  LMP 05/01/2013  Waist circumference 39 Objective:   Physical Exam  Constitutional: She is oriented to person,  place, and time. She appears well-developed and well-nourished. No distress.  HENT:  Head: Normocephalic and atraumatic.  Right Ear: Tympanic membrane, external ear and ear canal normal.  Left Ear: Tympanic membrane, external ear and ear canal normal.  Nose: Nose normal. No mucosal edema or rhinorrhea.  Mouth/Throat: Uvula is midline, oropharynx is clear and moist and mucous membranes are normal. No posterior oropharyngeal erythema.  Eyes: Conjunctivae and EOM are normal. Pupils are equal, round, and reactive to light. Right eye exhibits no discharge. Left eye exhibits no discharge. No scleral icterus.  Neck: Normal range of motion. Neck supple. No thyromegaly present.  Cardiovascular: Normal rate, regular rhythm, normal heart sounds and intact distal pulses.    Pulmonary/Chest: Effort normal and breath sounds normal. No respiratory distress.  Abdominal: Soft. Bowel sounds are normal. There is no tenderness.  Genitourinary: There is breast tenderness. No breast swelling, discharge or bleeding.  Fibrocystic breasts  Musculoskeletal: She exhibits no edema.  Lymphadenopathy:    She has no cervical adenopathy.  Neurological: She is alert and oriented to person, place, and time. She has normal reflexes.  Skin: Skin is warm and dry. She is not diaphoretic. No erythema.  Psychiatric: She has a normal mood and affect. Her behavior is normal.      Results for orders placed in visit on 05/19/13  GLUCOSE, POCT (MANUAL RESULT ENTRY)      Result Value Range   POC Glucose 86  70 - 99 mg/dl   EKG: NSR, no ischemic changes. Assessment & Plan:  Routine general medical examination at a health care facility - Plan: CBC with Differential, Lipid panel, TSH, Comprehensive metabolic panel, Pneumococcal polysaccharide vaccine 23-valent greater than or equal to 2yo subcutaneous/IM, EKG 12-Lead, POCT glucose (manual entry)  Need for prophylactic vaccination against Streptococcus pneumoniae (pneumococcus) - Plan: Pneumococcal polysaccharide vaccine 23-valent greater than or equal to 2yo subcutaneous/IM- needs x 1 only due to asthma.  Obesity (BMI 35.0-39.9 without comorbidity) - restart diet and exercise - info on bootcamp/groupons given. Discussed metformin or topamax.  Consider surgical options if this doesn't work.  Breast pain - Plan: MM Digital Screening- suspect fibrocystic breasts causing pain - rec decreasing caffeine. Pt would like to go ahead and check a mammogram. Suspect continuing breast enlargement.   HTN - refill lisinopril-hctz - discussed teratogenicity of lisinopril and consider changing med - consider trying on just hctz 25 (but pt thinks she might have failed this in the past - old chart reviewed and no record of pt being on just hctz prior.)  Pt will  stay on lisinopril for now but understands implications if accidental pregnancy occurs. Cont to use condoms everytime. Meds ordered this encounter  Medications  . lisinopril-hydrochlorothiazide (PRINZIDE,ZESTORETIC) 10-12.5 MG per tablet    Sig: Take 1 tablet by mouth daily.    Dispense:  90 tablet    Refill:  3  . albuterol (PROVENTIL HFA;VENTOLIN HFA) 108 (90 BASE) MCG/ACT inhaler    Sig: Inhale 2 puffs into the lungs every 4 (four) hours as needed for wheezing (cough, shortness of breath or wheezing.).    Dispense:  1 Inhaler    Refill:  1

## 2013-05-20 LAB — CBC WITH DIFFERENTIAL/PLATELET
Basophils Relative: 0 % (ref 0–1)
Eosinophils Absolute: 0.3 10*3/uL (ref 0.0–0.7)
Eosinophils Relative: 2 % (ref 0–5)
HCT: 33.9 % — ABNORMAL LOW (ref 36.0–46.0)
Hemoglobin: 11.7 g/dL — ABNORMAL LOW (ref 12.0–15.0)
Lymphocytes Relative: 17 % (ref 12–46)
MCHC: 34.5 g/dL (ref 30.0–36.0)
MCV: 60.8 fL — ABNORMAL LOW (ref 78.0–100.0)
Monocytes Relative: 4 % (ref 3–12)
Platelets: 374 10*3/uL (ref 150–400)
RBC: 5.58 MIL/uL — ABNORMAL HIGH (ref 3.87–5.11)
RDW: 17.1 % — ABNORMAL HIGH (ref 11.5–15.5)

## 2013-05-21 LAB — COMPREHENSIVE METABOLIC PANEL
Albumin: 4.3 g/dL (ref 3.5–5.2)
BUN: 11 mg/dL (ref 6–23)
CO2: 22 mEq/L (ref 19–32)
Calcium: 9.4 mg/dL (ref 8.4–10.5)
Chloride: 100 mEq/L (ref 96–112)
Glucose, Bld: 78 mg/dL (ref 70–99)
Potassium: 4 mEq/L (ref 3.5–5.3)
Sodium: 134 mEq/L — ABNORMAL LOW (ref 135–145)
Total Protein: 7.3 g/dL (ref 6.0–8.3)

## 2013-05-21 LAB — LIPID PANEL
Cholesterol: 180 mg/dL (ref 0–200)
Triglycerides: 82 mg/dL (ref <150)
VLDL: 16 mg/dL (ref 0–40)

## 2013-05-23 DIAGNOSIS — E669 Obesity, unspecified: Secondary | ICD-10-CM | POA: Insufficient documentation

## 2013-05-24 ENCOUNTER — Other Ambulatory Visit: Payer: Self-pay | Admitting: Family Medicine

## 2013-05-24 DIAGNOSIS — N644 Mastodynia: Secondary | ICD-10-CM

## 2013-06-02 ENCOUNTER — Ambulatory Visit
Admission: RE | Admit: 2013-06-02 | Discharge: 2013-06-02 | Disposition: A | Discharge status: Home / Self Care | Payer: 59 | Source: Ambulatory Visit | Attending: Family Medicine | Admitting: Family Medicine

## 2013-06-02 ENCOUNTER — Other Ambulatory Visit: Payer: Self-pay | Admitting: Family Medicine

## 2013-06-02 DIAGNOSIS — N644 Mastodynia: Secondary | ICD-10-CM

## 2013-06-16 ENCOUNTER — Telehealth: Payer: Self-pay

## 2013-06-16 NOTE — Telephone Encounter (Signed)
Pt dropped off biometric screening form. I have put it in Dr Alver Fisher box for completion.

## 2013-06-21 NOTE — Telephone Encounter (Signed)
Form completed and returned for faxing.

## 2014-06-04 ENCOUNTER — Other Ambulatory Visit: Payer: Self-pay | Admitting: Family Medicine

## 2014-06-04 ENCOUNTER — Encounter: Payer: Self-pay | Admitting: Family Medicine

## 2014-06-04 ENCOUNTER — Ambulatory Visit (INDEPENDENT_AMBULATORY_CARE_PROVIDER_SITE_OTHER): Payer: 59 | Admitting: Family Medicine

## 2014-06-04 VITALS — BP 132/74 | HR 70 | Temp 98.2°F | Resp 16 | Ht 62.0 in | Wt 235.0 lb

## 2014-06-04 DIAGNOSIS — D573 Sickle-cell trait: Secondary | ICD-10-CM

## 2014-06-04 DIAGNOSIS — J4521 Mild intermittent asthma with (acute) exacerbation: Secondary | ICD-10-CM

## 2014-06-04 DIAGNOSIS — R635 Abnormal weight gain: Secondary | ICD-10-CM

## 2014-06-04 DIAGNOSIS — M1710 Unilateral primary osteoarthritis, unspecified knee: Secondary | ICD-10-CM

## 2014-06-04 DIAGNOSIS — I10 Essential (primary) hypertension: Secondary | ICD-10-CM

## 2014-06-04 DIAGNOSIS — M1711 Unilateral primary osteoarthritis, right knee: Secondary | ICD-10-CM

## 2014-06-04 DIAGNOSIS — Z0000 Encounter for general adult medical examination without abnormal findings: Secondary | ICD-10-CM

## 2014-06-04 DIAGNOSIS — J45901 Unspecified asthma with (acute) exacerbation: Secondary | ICD-10-CM

## 2014-06-04 DIAGNOSIS — Z13220 Encounter for screening for lipoid disorders: Secondary | ICD-10-CM

## 2014-06-04 HISTORY — DX: Sickle-cell trait: D57.3

## 2014-06-04 LAB — LIPID PANEL
CHOLESTEROL: 159 mg/dL (ref 0–200)
HDL: 48 mg/dL (ref >39)
LDL CALC: 94 mg/dL (ref 0–99)
Total CHOL/HDL Ratio: 3.3 Ratio
Triglycerides: 83 mg/dL (ref <150)
VLDL: 17 mg/dL (ref 0–40)

## 2014-06-04 LAB — TSH: TSH: 1.351 u[IU]/mL (ref 0.350–4.500)

## 2014-06-04 LAB — COMPREHENSIVE METABOLIC PANEL
ALK PHOS: 68 U/L (ref 39–117)
ALT: 12 U/L (ref 0–35)
AST: 14 U/L (ref 0–37)
Albumin: 4.3 g/dL (ref 3.5–5.2)
BUN: 11 mg/dL (ref 6–23)
CALCIUM: 9.4 mg/dL (ref 8.4–10.5)
CHLORIDE: 100 meq/L (ref 96–112)
CO2: 23 meq/L (ref 19–32)
Creat: 0.78 mg/dL (ref 0.50–1.10)
Glucose, Bld: 86 mg/dL (ref 70–99)
POTASSIUM: 4.3 meq/L (ref 3.5–5.3)
Sodium: 134 mEq/L — ABNORMAL LOW (ref 135–145)
Total Bilirubin: 0.4 mg/dL (ref 0.2–1.2)
Total Protein: 7.4 g/dL (ref 6.0–8.3)

## 2014-06-04 LAB — CBC WITH DIFFERENTIAL/PLATELET
BASOS ABS: 0 10*3/uL (ref 0.0–0.1)
Basophils Relative: 0 % (ref 0–1)
Eosinophils Absolute: 0.3 10*3/uL (ref 0.0–0.7)
Eosinophils Relative: 3 % (ref 0–5)
HCT: 37.2 % (ref 36.0–46.0)
Hemoglobin: 12.5 g/dL (ref 12.0–15.0)
Lymphocytes Relative: 21 % (ref 12–46)
Lymphs Abs: 2.2 10*3/uL (ref 0.7–4.0)
MCH: 22.2 pg — AB (ref 26.0–34.0)
MCHC: 33.6 g/dL (ref 30.0–36.0)
MCV: 66 fL — AB (ref 78.0–100.0)
MONOS PCT: 5 % (ref 3–12)
Monocytes Absolute: 0.5 10*3/uL (ref 0.1–1.0)
NEUTROS ABS: 7.4 10*3/uL (ref 1.7–7.7)
NEUTROS PCT: 71 % (ref 43–77)
Platelets: 353 10*3/uL (ref 150–400)
RBC: 5.64 MIL/uL — AB (ref 3.87–5.11)
RDW: 16.6 % — AB (ref 11.5–15.5)
WBC: 10.4 10*3/uL (ref 4.0–10.5)

## 2014-06-04 LAB — POCT URINE PREGNANCY: PREG TEST UR: NEGATIVE

## 2014-06-04 LAB — T4, FREE: Free T4: 1.03 ng/dL (ref 0.80–1.80)

## 2014-06-04 LAB — T3, FREE: T3, Free: 3 pg/mL (ref 2.3–4.2)

## 2014-06-04 MED ORDER — LISINOPRIL-HYDROCHLOROTHIAZIDE 10-12.5 MG PO TABS: 1.0000 | ORAL_TABLET | Freq: Every day | ORAL | Status: DC

## 2014-06-04 MED ORDER — ALBUTEROL SULFATE HFA 108 (90 BASE) MCG/ACT IN AERS: 2.0000 | INHALATION_SPRAY | RESPIRATORY_TRACT | Status: DC | PRN

## 2014-06-04 MED ORDER — MELOXICAM 7.5 MG PO TABS: 7.5000 mg | ORAL_TABLET | Freq: Every day | ORAL | Status: DC

## 2014-06-04 NOTE — Patient Instructions (Signed)
Good to see you today!  I will be in touch with your labs asap; I will send you a copy in the mail.   If you like, look into the bariatric program through [facility].  Sign up for the preliminary seminar to see if you are interested in going further.  If you decide not to go through with surgery, consider Weight Wat[employer] or [employer], and group exercise is also a great way to stay motivated

## 2014-06-04 NOTE — Progress Notes (Signed)
[facility] [address] [place] [address] [phone]  Date:  06/04/2014   Name:  Heidi Robinson   DOB:  1976-01-07   MRN:  [id number]  PCP:  No primary provider on file.    Chief Complaint: Annual Exam   History of Present Illness:  Heidi Robinson is a 38 y.o. very pleasant female patient who presents with the following:  Here today for a CPE without pap (pap and mammogram per her OBG Dr. Ronita Hipps).  Last labs about one week ago. I have not seen her in the past.  She is fasting today for labs.  HTN- controlled Asthma is controlled with albuterol- she uses this every few months.    Wt Readings from Last 3 Encounters:  06/04/14 235 lb (106.595 kg)  05/19/13 216 lb 12.8 oz (98.34 kg)  06/19/12 213 lb 9.6 oz (96.888 kg)   Her menses are regular generally.  She did have some irregular bleeding this month.   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   She uses condoms regularly for contraception.  She has 2 children- ages 52 and 48 years old.  She is married and SA with her husband.    She is frustrated by difficulty with weight loss. She has tried a lot of diets, and might be interested in weight loss surgery.  She was seen at another clinic a few months ago and dx with OA of her right knee; she has noted some pain in this knee.  This also motivates her to lose weight to help preserve her joints    Patient Active Problem List   Diagnosis Date Noted  . Obesity (BMI 35.0-39.9 without comorbidity) 05/23/2013  . HTN (hypertension) 02/02/2012  . Obesity (BMI 30-39.9) 02/02/2012  . NAUSEA WITH VOMITING 12/25/2009  . ABDOMINAL BLOATING 12/25/2009  . ABDOMINAL PAIN, EPIGASTRIC 12/19/2009    Past Medical History  Diagnosis Date  . Asthma     Past Surgical History  Procedure Laterality Date  . Tonsillecomy    . Tonsillectomy      History  Substance Use Topics  . Smoking status: Former Smoker -- 2 years    Types: Cigarettes    Quit date: 07/11/2003   . Smokeless tobacco: Not on file  . Alcohol Use: No    Family History  Problem Relation Age of Onset  . Hypertension Mother   . Kidney disease Brother   . Hypertension Maternal Grandmother     Allergies  Allergen Reactions  . Penicillins Anaphylaxis    Medication list has been reviewed and updated.  Current Outpatient Prescriptions on File Prior to Visit  Medication Sig Dispense Refill  . albuterol (PROVENTIL HFA;VENTOLIN HFA) 108 (90 BASE) MCG/ACT inhaler Inhale 2 puffs into the lungs every 4 (four) hours as needed for wheezing (cough, shortness of breath or wheezing.).  1 Inhaler  1  . lisinopril-hydrochlorothiazide (PRINZIDE,ZESTORETIC) 10-12.5 MG per tablet Take 1 tablet by mouth daily.  90 tablet  3  . Multiple Vitamin (MULTIVITAMINS PO) Take by mouth.       No current facility-administered medications on file prior to visit.    Review of Systems:  As per HPI- otherwise negative.   Physical Examination: Filed Vitals:   06/04/14 0806  BP: 132/74  Pulse: 100  Temp: 98.2 F (36.8 C)  Resp: 16   Filed Vitals:   06/04/14 0806  Height: 5\' 2"  (1.575 m)  Weight: 235 lb (106.595 kg)  Body mass index is 42.97 kg/(m^2). Ideal Body Weight: Weight in (lb) to have BMI = 25: 136.4  GEN: WDWN, NAD, Non-toxic, A & O x 3, obese, looks well HEENT: Atraumatic, Normocephalic. Neck supple. No masses, No LAD.  Bilateral TM wnl, oropharynx normal.  PEERL,EOMI.   Ears and Nose: No external deformity. CV: RRR, No M/G/R. No JVD. No thrill. No extra heart sounds. PULM: CTA B, no wheezes, crackles, rhonchi. No retractions. No resp. distress. No accessory muscle use. ABD: S, NT, ND. No rebound. No HSM. EXTR: No c/c/e NEURO Normal gait.  PSYCH: Normally interactive. Conversant. Not depressed or anxious appearing.  Calm demeanor.  Right knee: mild crepitus, but joint is stable  Results for orders placed in visit on 06/04/14  POCT URINE PREGNANCY      Result Value Ref Range    Preg Test, Ur Negative      Assessment and Plan: Routine general medical examination at a health care facility - Plan: CBC with Differential, Comprehensive metabolic panel  Essential hypertension, benign  Weight gain - Plan: TSH, POCT urine pregnancy, T3, Free, T4, Free  Screening for hyperlipidemia - Plan: Lipid panel  Essential hypertension - Plan: lisinopril-hydrochlorothiazide (PRINZIDE,ZESTORETIC) 10-12.5 MG per tablet  Asthma with acute exacerbation, mild intermittent - Plan: albuterol (PROVENTIL HFA;VENTOLIN HFA) 108 (90 BASE) MCG/ACT inhaler  Primary osteoarthritis of right knee - Plan: meloxicam (MOBIC) 7.5 MG tablet  BP controlled Check labs as above- Will plan further follow- up pending labs. Encouraged exercise and weight loss for her health and also to prevent progression of her OA.   See patient instructions for more details.   Reminded of need to avoid pregnancy as she is on an ACE-I    Signed Lamar Blinks, MD

## 2014-06-05 ENCOUNTER — Encounter: Payer: Self-pay | Admitting: Family Medicine

## 2014-06-05 LAB — FERRITIN: FERRITIN: 29 ng/mL (ref 10–291)

## 2014-06-12 ENCOUNTER — Encounter: Payer: Self-pay | Admitting: Family Medicine

## 2014-06-12 DIAGNOSIS — D573 Sickle-cell trait: Secondary | ICD-10-CM | POA: Insufficient documentation

## 2014-06-12 LAB — HEMOGLOBINOPATHY EVALUATION
HEMOGLOBIN OTHER: 0 %
HGB A2 QUANT: 2.5 % (ref 2.2–3.2)
Hgb A: 64.7 % — ABNORMAL LOW (ref 96.8–97.8)
Hgb F Quant: 0.3 % (ref 0.0–2.0)
Hgb S Quant: 32.5 % — ABNORMAL HIGH

## 2014-08-31 ENCOUNTER — Telehealth: Payer: Self-pay

## 2014-08-31 NOTE — Telephone Encounter (Signed)
Pt is wanting a referral for a diagnostic mammogram from something she has found

## 2014-09-02 NOTE — Telephone Encounter (Signed)
We would not order a diagnostic mammogram without an OV.

## 2014-09-02 NOTE — Telephone Encounter (Signed)
lmom to cb. 

## 2014-09-02 NOTE — Telephone Encounter (Signed)
Does pt have to come in or can we just order for her

## 2014-09-03 NOTE — Telephone Encounter (Signed)
Spoke to pt- she has made an appt with her OB and will be getting evaluated there.

## 2014-09-04 ENCOUNTER — Other Ambulatory Visit: Payer: Self-pay | Admitting: Obstetrics and Gynecology

## 2014-09-04 DIAGNOSIS — N6320 Unspecified lump in the left breast, unspecified quadrant: Secondary | ICD-10-CM

## 2014-09-14 ENCOUNTER — Encounter (INDEPENDENT_AMBULATORY_CARE_PROVIDER_SITE_OTHER): Payer: Self-pay

## 2014-09-14 ENCOUNTER — Ambulatory Visit
Admission: RE | Admit: 2014-09-14 | Discharge: 2014-09-14 | Disposition: A | Discharge status: Home / Self Care | Payer: 59 | Source: Ambulatory Visit | Attending: Obstetrics and Gynecology | Admitting: Obstetrics and Gynecology

## 2014-09-14 DIAGNOSIS — N6320 Unspecified lump in the left breast, unspecified quadrant: Secondary | ICD-10-CM

## 2015-07-31 ENCOUNTER — Ambulatory Visit (INDEPENDENT_AMBULATORY_CARE_PROVIDER_SITE_OTHER): Payer: 59 | Admitting: Family Medicine

## 2015-07-31 ENCOUNTER — Encounter: Payer: Self-pay | Admitting: Family Medicine

## 2015-07-31 VITALS — BP 118/76 | HR 79 | Temp 97.9°F | Resp 16 | Ht 62.0 in | Wt 224.6 lb

## 2015-07-31 DIAGNOSIS — Z0000 Encounter for general adult medical examination without abnormal findings: Secondary | ICD-10-CM | POA: Diagnosis not present

## 2015-07-31 DIAGNOSIS — Z131 Encounter for screening for diabetes mellitus: Secondary | ICD-10-CM | POA: Diagnosis not present

## 2015-07-31 DIAGNOSIS — Z862 Personal history of diseases of the blood and blood-forming organs and certain disorders involving the immune mechanism: Secondary | ICD-10-CM | POA: Diagnosis not present

## 2015-07-31 DIAGNOSIS — R0789 Other chest pain: Secondary | ICD-10-CM | POA: Diagnosis not present

## 2015-07-31 DIAGNOSIS — I10 Essential (primary) hypertension: Secondary | ICD-10-CM | POA: Diagnosis not present

## 2015-07-31 DIAGNOSIS — Z23 Encounter for immunization: Secondary | ICD-10-CM

## 2015-07-31 DIAGNOSIS — M1711 Unilateral primary osteoarthritis, right knee: Secondary | ICD-10-CM | POA: Diagnosis not present

## 2015-07-31 DIAGNOSIS — D72829 Elevated white blood cell count, unspecified: Secondary | ICD-10-CM

## 2015-07-31 DIAGNOSIS — Z13220 Encounter for screening for lipoid disorders: Secondary | ICD-10-CM | POA: Diagnosis not present

## 2015-07-31 DIAGNOSIS — J4521 Mild intermittent asthma with (acute) exacerbation: Secondary | ICD-10-CM | POA: Diagnosis not present

## 2015-07-31 LAB — COMPREHENSIVE METABOLIC PANEL
ALK PHOS: 66 U/L (ref 33–115)
ALT: 10 U/L (ref 6–29)
AST: 13 U/L (ref 10–30)
Albumin: 4.3 g/dL (ref 3.6–5.1)
BILIRUBIN TOTAL: 0.4 mg/dL (ref 0.2–1.2)
BUN: 12 mg/dL (ref 7–25)
CO2: 26 mmol/L (ref 20–31)
Calcium: 9.2 mg/dL (ref 8.6–10.2)
Chloride: 100 mmol/L (ref 98–110)
Creat: 0.75 mg/dL (ref 0.50–1.10)
GLUCOSE: 73 mg/dL (ref 65–99)
POTASSIUM: 4.1 mmol/L (ref 3.5–5.3)
Sodium: 136 mmol/L (ref 135–146)
Total Protein: 7 g/dL (ref 6.1–8.1)

## 2015-07-31 LAB — LIPID PANEL
CHOLESTEROL: 146 mg/dL (ref 125–200)
HDL: 41 mg/dL — AB (ref >=46)
LDL Cholesterol: 89 mg/dL (ref <130)
Total CHOL/HDL Ratio: 3.6 Ratio (ref <=5.0)
Triglycerides: 81 mg/dL (ref <150)
VLDL: 16 mg/dL (ref <30)

## 2015-07-31 LAB — CBC
HCT: 34 % — ABNORMAL LOW (ref 36.0–46.0)
Hemoglobin: 10.9 g/dL — ABNORMAL LOW (ref 12.0–15.0)
MCH: 20 pg — ABNORMAL LOW (ref 26.0–34.0)
MCHC: 32.1 g/dL (ref 30.0–36.0)
MCV: 62.4 fL — ABNORMAL LOW (ref 78.0–100.0)
Platelets: 446 10*3/uL — ABNORMAL HIGH (ref 150–400)
RBC: 5.45 MIL/uL — ABNORMAL HIGH (ref 3.87–5.11)
RDW: 18.8 % — AB (ref 11.5–15.5)
WBC: 12.6 10*3/uL — AB (ref 4.0–10.5)

## 2015-07-31 MED ORDER — MELOXICAM 7.5 MG PO TABS: 7.5000 mg | ORAL_TABLET | Freq: Every day | ORAL | Status: DC

## 2015-07-31 MED ORDER — ALBUTEROL SULFATE HFA 108 (90 BASE) MCG/ACT IN AERS: 2.0000 | INHALATION_SPRAY | RESPIRATORY_TRACT | Status: DC | PRN

## 2015-07-31 MED ORDER — LISINOPRIL-HYDROCHLOROTHIAZIDE 10-12.5 MG PO TABS: 1.0000 | ORAL_TABLET | Freq: Every day | ORAL | Status: DC

## 2015-07-31 NOTE — Patient Instructions (Signed)
I will be in touch with your labs and will send your forms to your insurance company for you Remember if you do decide to become pregnant (or find yourself pregnant!) we need to stop your BP medication nad use something different Your EKG looks fine If your chest pains persist, change or become associated with exercise please let me know right away

## 2015-07-31 NOTE — Progress Notes (Addendum)
[facility] [address], [place] [address] [phone]  Date:  07/31/2015   Name:  Heidi Robinson   DOB:  11/08/1976   MRN:  [id number]  PCP:  No primary care Leeam Cedrone on file.    Chief Complaint: Annual Exam   History of Present Illness:  Heidi Robinson is a 39 y.o. very pleasant female patient who presents with the following:  Last seen by myself about one year ago to follow-up on her HTN and other concerns.  She is fasting today for labs  Asthma continues to be under good control generally  She has lost some weight- she is working with a company called [employer] and is on a meal plan She has noted some pain in her mid chest with twisting her trunk or taking a deep breath for the last 6 weeks or so.  It is a sharp and stabbing pain that lasts for just a moment No SOB No exertional CP She would like a flu shot She knows that her ace- I is contraindicated for pregnancy  Wt Readings from Last 3 Encounters:  07/31/15 224 lb 9.6 oz (101.878 kg)  06/04/14 235 lb (106.595 kg)  05/19/13 216 lb 12.8 oz (98.34 kg)     Patient Active Problem List   Diagnosis Date Noted  . Sickle cell trait 06/12/2014  . Obesity (BMI 35.0-39.9 without comorbidity) 05/23/2013  . HTN (hypertension) 02/02/2012  . Obesity (BMI 30-39.9) 02/02/2012  . NAUSEA WITH VOMITING 12/25/2009  . ABDOMINAL BLOATING 12/25/2009  . ABDOMINAL PAIN, EPIGASTRIC 12/19/2009    Past Medical History  Diagnosis Date  . Asthma   . Allergy   . Anemia   . Hypertension     Past Surgical History  Procedure Laterality Date  . Tonsillecomy    . Tonsillectomy      Social History  Substance Use Topics  . Smoking status: Former Smoker -- 2 years    Types: Cigarettes    Quit date: 07/11/2003  . Smokeless tobacco: None  . Alcohol Use: No    Family History  Problem Relation Age of Onset  . Hypertension Mother   . Kidney disease Brother   . Hypertension Maternal Grandmother   . Cancer  Maternal Grandmother     bladder  . Hyperlipidemia Father   . Cancer Paternal Grandfather     prostate    Allergies  Allergen Reactions  . Penicillins Anaphylaxis    Medication list has been reviewed and updated.  Current Outpatient Prescriptions on File Prior to Visit  Medication Sig Dispense Refill  . albuterol (PROVENTIL HFA;VENTOLIN HFA) 108 (90 BASE) MCG/ACT inhaler Inhale 2 puffs into the lungs every 4 (four) hours as needed for wheezing (cough, shortness of breath or wheezing.). 1 Inhaler 3  . lisinopril-hydrochlorothiazide (PRINZIDE,ZESTORETIC) 10-12.5 MG per tablet Take 1 tablet by mouth daily. 90 tablet 3  . meloxicam (MOBIC) 7.5 MG tablet Take 1 tablet (7.5 mg total) by mouth daily. As needed for knee pain 90 tablet 1  . Multiple Vitamin (MULTIVITAMINS PO) Take by mouth.     No current facility-administered medications on file prior to visit.    Review of Systems:  As per HPI- otherwise negative.   Physical Examination: Filed Vitals:   07/31/15 1342  BP: 118/76  Pulse: 79  Temp: 97.9 F (36.6 C)  Resp: 16   Filed Vitals:   07/31/15 1342  Height: 5\' 2"  (1.575 m)  Weight: 224 lb 9.6 oz (101.878  kg)   Body mass index is 41.07 kg/(m^2). Ideal Body Weight: Weight in (lb) to have BMI = 25: 136.4  GEN: WDWN, NAD, Non-toxic, A & O x 3, obese, looks well HEENT: Atraumatic, Normocephalic. Neck supple. No masses, No LAD.  Bilateral TM wnl, oropharynx normal.  PEERL,EOMI.   Ears and Nose: No external deformity. CV: RRR, No M/G/R. No JVD. No thrill. No extra heart sounds. PULM: CTA B, no wheezes, crackles, rhonchi. No retractions. No resp. distress. No accessory muscle use. ABD: S, NT, ND. No rebound. No HSM. EXTR: No c/c/e NEURO Normal gait.  PSYCH: Normally interactive. Conversant. Not depressed or anxious appearing.  Calm demeanor.   EKG: NSR, no ST changes, normal EKG  Assessment and Plan: Physical exam  Flu vaccine need - Plan: Flu Vaccine QUAD 36+ mos  IM  Essential hypertension - Plan: lisinopril-hydrochlorothiazide (PRINZIDE,ZESTORETIC) 10-12.5 MG per tablet  Asthma with acute exacerbation, mild intermittent - Plan: albuterol (PROVENTIL HFA;VENTOLIN HFA) 108 (90 BASE) MCG/ACT inhaler  Primary osteoarthritis of right knee - Plan: meloxicam (MOBIC) 7.5 MG tablet  History of sickle cell trait - Plan: CBC  Screening for diabetes mellitus - Plan: Comprehensive metabolic panel, Hemoglobin A1c  Screening for hyperlipidemia - Plan: Lipid panel  Other chest pain - Plan: EKG 12-Lead  Labs pending as above Her EKG is reassuring and her CP sounds MSK in origin.  She will take her mobic daily for the next week or so (she is not taking it regularly now) and will let me know if the CP does not resolve Fill out and fax forms for pt when labs come in BP controlled   Signed Lamar Blinks, MD

## 2015-08-01 ENCOUNTER — Encounter: Payer: Self-pay | Admitting: Family Medicine

## 2015-08-01 LAB — HEMOGLOBIN A1C
HEMOGLOBIN A1C: 5.6 % (ref <5.7)
Mean Plasma Glucose: 114 mg/dL (ref <117)

## 2015-08-01 NOTE — Addendum Note (Signed)
Addended by: Darreld Mclean on: 08/01/2015 06:58 AM   Modules accepted: Orders

## 2015-11-26 ENCOUNTER — Telehealth: Payer: Self-pay

## 2015-11-26 NOTE — Telephone Encounter (Signed)
Your cholesterol, metabolic profile, 123456 (diabetes screening) all look good. I would like to see your HDL (good cholesterol) a little higher; exercise will help here, you might also try an omega 3 supplement.   Your blood count is a bit unexpected but likely harmless- your white cells are platelets are up, and your red cells are down.  I would recommend that we recheck this in a month or so to make sure it goes back to normal.  I will leave an order so you can come in for a blood draw only.   If you have any questions or concerns, please don't hesitate to call.  Sincerely,    COPLAND,JESSICA, MD   Future order is placed. Called pt to let her know. Pt understood.

## 2015-11-26 NOTE — Telephone Encounter (Signed)
Patient called to ask about follow-up labwork for her OV in September with Dr Lorelei Pont.  She wants to know if she needs to schedule an appointment or just walk in for the labwork.  There isn't a future order in for labwork, so I am not sure if she needed a repeat OV along with the labwork.  Also, patient wants to know if she will need to fast for the labwork.  Please advise, thank you.  CB#: [phone]

## 2015-11-29 ENCOUNTER — Other Ambulatory Visit (INDEPENDENT_AMBULATORY_CARE_PROVIDER_SITE_OTHER): Payer: 59

## 2015-11-29 ENCOUNTER — Other Ambulatory Visit: Payer: Self-pay | Admitting: Family Medicine

## 2015-11-29 DIAGNOSIS — D72829 Elevated white blood cell count, unspecified: Secondary | ICD-10-CM | POA: Diagnosis not present

## 2015-11-29 DIAGNOSIS — D509 Iron deficiency anemia, unspecified: Secondary | ICD-10-CM

## 2015-11-29 LAB — CBC
HEMATOCRIT: 35.1 % — AB (ref 36.0–46.0)
Hemoglobin: 11.1 g/dL — ABNORMAL LOW (ref 12.0–15.0)
MCH: 19.9 pg — ABNORMAL LOW (ref 26.0–34.0)
MCHC: 31.6 g/dL (ref 30.0–36.0)
MCV: 62.9 fL — AB (ref 78.0–100.0)
Platelets: 422 10*3/uL — ABNORMAL HIGH (ref 150–400)
RBC: 5.58 MIL/uL — AB (ref 3.87–5.11)
RDW: 20.3 % — ABNORMAL HIGH (ref 11.5–15.5)
WBC: 10.1 10*3/uL (ref 4.0–10.5)

## 2015-12-02 LAB — FERRITIN: FERRITIN: 10 ng/mL (ref 10–291)

## 2015-12-03 ENCOUNTER — Encounter: Payer: Self-pay | Admitting: Family Medicine

## 2015-12-23 ENCOUNTER — Ambulatory Visit (INDEPENDENT_AMBULATORY_CARE_PROVIDER_SITE_OTHER): Payer: 59 | Admitting: Family Medicine

## 2015-12-23 VITALS — BP 129/85 | HR 83 | Temp 99.1°F | Resp 17 | Ht 62.0 in | Wt 222.0 lb

## 2015-12-23 DIAGNOSIS — R05 Cough: Secondary | ICD-10-CM | POA: Diagnosis not present

## 2015-12-23 DIAGNOSIS — J4521 Mild intermittent asthma with (acute) exacerbation: Secondary | ICD-10-CM | POA: Diagnosis not present

## 2015-12-23 DIAGNOSIS — R059 Cough, unspecified: Secondary | ICD-10-CM

## 2015-12-23 MED ORDER — PREDNISONE 20 MG PO TABS: 40.0000 mg | ORAL_TABLET | Freq: Every day | ORAL | Status: DC

## 2015-12-23 MED ORDER — AZITHROMYCIN 250 MG PO TABS: ORAL_TABLET | ORAL | Status: DC

## 2015-12-23 MED ORDER — HYDROCOD POLST-CPM POLST ER 10-8 MG/5ML PO SUER: 5.0000 mL | Freq: Two times a day (BID) | ORAL | Status: DC | PRN

## 2015-12-23 NOTE — Patient Instructions (Addendum)
Drink plenty of water. Get adequate amount of rest every night. Start prednisone tomorrow morning Try sudafed once a day for congestion and afrin nasal spray twice a day for maximum 3 days

## 2015-12-23 NOTE — Progress Notes (Signed)
Subjective:    Patient ID: Heidi Robinson, female    DOB: [birth year], 40 y.o.   MRN: [id number]  HPI This is a pleasant female who presents with cough, wheeze and chest tightness since last Wednesday. It is worst in the mornings. She states that she has been having nasal drainage and sputum that is clear in color. She reports a headache and bilateral ears pressure intermittently- took some Zyrtec without relief. She also tried mucinex DM with no relief. She states that she believes this could have started with her asthma flaring up without relief from her albuterol inhaler. Patient states that she has not been running a fever. States today is her worst day. She reports good control of asthma, having symptoms only with upper respiratory infections. She does not smoke.     Past Medical History  Diagnosis Date  . Asthma   . Allergy   . Anemia   . Hypertension    Past Surgical History  Procedure Laterality Date  . Tonsillecomy    . Tonsillectomy     Family History  Problem Relation Age of Onset  . Hypertension Mother   . Kidney disease Brother   . Hypertension Maternal Grandmother   . Cancer Maternal Grandmother     bladder  . Hyperlipidemia Father   . Cancer Paternal Grandfather     prostate   Social History  Substance Use Topics  . Smoking status: Former Smoker -- 2 years    Types: Cigarettes    Quit date: 07/11/2003  . Smokeless tobacco: None  . Alcohol Use: No     Review of Systems  Constitutional: Positive for chills (when illness first started) and fatigue. Negative for fever.  HENT: Positive for congestion (bilateral nose), rhinorrhea (clear drainage), sinus pressure (head) and sneezing (intermittently). Negative for ear pain (pressure bilaterally) and sore throat.   Respiratory: Positive for cough (clear sputum) and chest tightness (intermittently ). Negative for shortness of breath.   Cardiovascular: Negative for chest pain.  Gastrointestinal: Positive for diarrhea  (when the illness first started). Negative for nausea and vomiting.  Musculoskeletal: Negative for myalgias.  Neurological: Positive for headaches (sinus).  Psychiatric/Behavioral: Positive for sleep disturbance (due to illness).       Objective:   Physical Exam  Constitutional: She is oriented to person, place, and time. She appears well-developed and well-nourished. Distressed: looks ill.  HENT:  Head: Normocephalic.  Nose: Mucosal edema (bilaterally) and rhinorrhea (clear) present.  TMs opaque and retracted.   Eyes: Pupils are equal, round, and reactive to light.  Neck: Normal range of motion.  Cardiovascular: Normal rate, regular rhythm and normal heart sounds.   Pulmonary/Chest: Effort normal and breath sounds normal. She has no wheezes.  Neurological: She is alert and oriented to person, place, and time.  Skin: Skin is warm and dry.  Psychiatric: She has a normal mood and affect. Her behavior is normal. Judgment and thought content normal.   BP 129/85 mmHg  Pulse 83  Temp(Src) 99.1 F (37.3 C) (Oral)  Resp 17  Ht 5\' 2"  (1.575 m)  Wt 222 lb (100.699 kg)  BMI 40.59 kg/m2  SpO2 96%  LMP 12/09/2015 Wt Readings from Last 3 Encounters:  12/23/15 222 lb (100.699 kg)  07/31/15 224 lb 9.6 oz (101.878 kg)  06/04/14 235 lb (106.595 kg)   PF- 300, predicted 472    Assessment & Plan:  1. Asthmatic bronchitis, mild intermittent, with acute exacerbation Continue albuterol as needed. Provided what and see antibiotics  if not better in two days. - azithromycin (ZITHROMAX Z-PAK) 250 MG tablet; Take 2 tablets first day then 1 every day  Dispense: 6 each; Refill: 0 - predniSONE (DELTASONE) 20 MG tablet; Take 2 tablets (40 mg total) by mouth daily with breakfast.  Dispense: 10 tablet; Refill: 0 - chlorpheniramine-HYDROcodone (TUSSIONEX PENNKINETIC ER) 10-8 MG/5ML SUER; Take 5 mLs by mouth every 12 (twelve) hours as needed for cough.  Dispense: 115 mL; Refill: 0  2. Cough -  azithromycin (ZITHROMAX Z-PAK) 250 MG tablet; Take 2 tablets first day then 1 every day  Dispense: 6 each; Refill: 0 - predniSONE (DELTASONE) 20 MG tablet; Take 2 tablets (40 mg total) by mouth daily with breakfast.  Dispense: 10 tablet; Refill: 0 - chlorpheniramine-HYDROcodone (TUSSIONEX PENNKINETIC ER) 10-8 MG/5ML SUER; Take 5 mLs by mouth every 12 (twelve) hours as needed for cough.  Dispense: 115 mL; Refill: 0  - RTC precautions reviewed- if no improvement with treatment, if worsening symptoms, if not better in 4-5 days.  Clarene Reamer, FNP-BC  [facility]  12/23/2015 6:58 PM

## 2016-03-31 ENCOUNTER — Encounter: Payer: Self-pay | Admitting: Gastroenterology

## 2016-05-04 ENCOUNTER — Telehealth: Payer: Self-pay

## 2016-05-04 ENCOUNTER — Ambulatory Visit (INDEPENDENT_AMBULATORY_CARE_PROVIDER_SITE_OTHER): Payer: 59

## 2016-05-04 ENCOUNTER — Ambulatory Visit (INDEPENDENT_AMBULATORY_CARE_PROVIDER_SITE_OTHER): Payer: 59 | Admitting: Family Medicine

## 2016-05-04 VITALS — BP 114/70 | HR 98 | Temp 98.9°F | Resp 18 | Ht 62.0 in | Wt 231.0 lb

## 2016-05-04 DIAGNOSIS — I10 Essential (primary) hypertension: Secondary | ICD-10-CM | POA: Diagnosis not present

## 2016-05-04 DIAGNOSIS — D509 Iron deficiency anemia, unspecified: Secondary | ICD-10-CM | POA: Diagnosis not present

## 2016-05-04 DIAGNOSIS — M79641 Pain in right hand: Secondary | ICD-10-CM

## 2016-05-04 DIAGNOSIS — H538 Other visual disturbances: Secondary | ICD-10-CM | POA: Diagnosis not present

## 2016-05-04 DIAGNOSIS — Z5181 Encounter for therapeutic drug level monitoring: Secondary | ICD-10-CM

## 2016-05-04 DIAGNOSIS — K5909 Other constipation: Secondary | ICD-10-CM

## 2016-05-04 DIAGNOSIS — R5383 Other fatigue: Secondary | ICD-10-CM | POA: Diagnosis not present

## 2016-05-04 DIAGNOSIS — F959 Tic disorder, unspecified: Secondary | ICD-10-CM

## 2016-05-04 DIAGNOSIS — K5900 Constipation, unspecified: Secondary | ICD-10-CM

## 2016-05-04 MED ORDER — LISINOPRIL-HYDROCHLOROTHIAZIDE 10-12.5 MG PO TABS: 1.0000 | ORAL_TABLET | Freq: Every day | ORAL | Status: DC

## 2016-05-04 MED ORDER — MELOXICAM 15 MG PO TABS: 15.0000 mg | ORAL_TABLET | Freq: Every day | ORAL | Status: DC

## 2016-05-04 NOTE — Telephone Encounter (Signed)
Pt wants to know  If she can have a referral done for her to see her eye doctor. She had Dr. Lorelei Pont listed as her PCP but found out that Dr. Lorelei Pont left and the practice that she's with now is no longer in network. I advised pt that since she hasn't been seen here in a couple months that may not be possible without her coming in for a visit. She states that she chose Dr. Brigitte Pulse as her PCP now but she's never seen her before. Advised pt to come in for a visit to be seen to have the referral placed. Pt understood.

## 2016-05-04 NOTE — Patient Instructions (Addendum)
Do not use the meloxicam with any other otc pain medication other than tylenol/acetaminophen - so no aleve, ibuprofen, motrin, advil, etc.  Ice the hand for 10 to 15 minutes twice a day.  If no improvement or getting worse, please call so we can take the next steps in evaluation.  Meds ordered this encounter  Medications  . lisinopril-hydrochlorothiazide (PRINZIDE,ZESTORETIC) 10-12.5 MG tablet    Sig: Take 1 tablet by mouth daily.    Dispense:  90 tablet    Refill:  3  . meloxicam (MOBIC) 15 MG tablet    Sig: Take 1 tablet (15 mg total) by mouth daily.    Dispense:  30 tablet    Refill:  1    IF you received an x-ray today, you will receive an invoice from [facility]. Please contact [facility] at [phone] with questions or concerns regarding your invoice.   IF you received labwork today, you will receive an invoice from [employer]. Please contact [facility] at [phone] with questions or concerns regarding your invoice.   Our billing staff will not be able to assist you with questions regarding bills from these companies.  You will be contacted with the lab results as soon as they are available. The fastest way to get your results is to activate your My Chart account. Instructions are located on the last page of this paperwork. If you have not heard from [place] regarding the results in 2 weeks, please contact this office.     Blepharospasm Blepharospasm is a sudden tightening (spasm) of the muscles around your eyes (orbicularis oculi). It causes attacks of abnormal and uncontrollable blinking that come and go without warning. This type of abnormal muscle movement is called dystonia. Dystonia usually affects both eyes, but it does not affect other facial muscles or other parts of the body. Blepharospasm does not cause vision loss or lead to other serious physical problems. CAUSES Blepharospasm is caused by an abnormality in a part of your  brain that is called the basal ganglion. The exact cause of the abnormality is not known. Stress, fatigue, eye irritation, or bright light may trigger attacks of blepharospasm. RISK FACTORS You may be at greater risk for blepharospasm if you:  Have a family history of blepharospasm.  Are female.  Are 6-62 years old. SIGNS AND SYMPTOMS  Eye irritation and dryness are the first symptoms of blepharospasm. Your eyes may also feel tired or irritated when they are exposed to bright lights. As blepharospasm gets worse, uncontrollable blinking becomes more frequent. Other eye symptoms may include:  Uncontrolled and tight closing.  Watering.  Dryness.  Eye muscle pain.  Gritty sensation.  Sensitivity to light. DIAGNOSIS Your health care provider may diagnose blepharospasm based on your symptoms and your medical history. Your health care provider may also do a physical exam to confirm the diagnosis. There are no blood tests or other tests to help diagnose blepharospasm. TREATMENT There is no cure for blepharospasm. Treatments that are used to manage the condition may include:  Botulinum toxin injections. Botulinum toxin is a substance that is produced by bacteria that cause muscle paralysis. Injecting this toxin into the muscles around the eye may control blepharospasm for up to three months. Injections are done with a tiny needle. They can be repeated as needed.  Medicines. These include muscle relaxants, sedatives, and medicines that are called anticholinergics. Treatment with medicine is less successful than using injections.  Surgery. If other treatments have not worked, you  may need surgery to remove part of the orbicularis oculi muscles (myectomy). HOME CARE INSTRUCTIONS Several home care management strategies may help. You may have to try different techniques to find what works best for you. These may include:  Avoiding triggers that may bring on your attacks.  Resting and  avoiding stress as much as possible.  Applying gentle pressure to the side of your eye or face. Sometimes this can stop an attack.  Doing a specific activity that can halt an attack. Examples include laughing, singing, yawning, and chewing.  Wearing dark glasses and a sun visor to protect your eyes from bright light.  Trying alternative treatments such as acupuncture, biofeedback, hypnosis, or meditation.  Going to sleep or taking a nap. Blepharospasm usually stops during sleep. SEEK MEDICAL CARE IF:  Your attacks get worse or happen more often.  You are anxious or depressed because of your attacks.   This information is not intended to replace advice given to you by your health care provider. Make sure you discuss any questions you have with your health care provider.   Document Released: 10/29/2003 Document Revised: 07/17/2015 Document Reviewed: 06/19/2014 Elsevier Interactive Patient Education [employer].

## 2016-05-04 NOTE — Progress Notes (Signed)
Subjective:  By signing my name below, I, Heidi Robinson, attest that this documentation has been prepared under the direction and in the presence of Heidi Cheadle, MD. Electronically Signed: [name], [place]. 05/04/2016 , 6:01 PM .  Patient was seen in Room 10 .   Patient ID: Heidi Robinson, female    DOB: [birth year], 40 y.o.   MRN: [id number] Chief Complaint  Patient presents with  . Establish Care  . Other    facial twitching, x 1 week  . Hand Pain    right   HPI Heidi Robinson is a 40 y.o. female who presents to [facility] complaining of facial twitching that started 1 week ago.  She was previously being cared for by Dr. Lorelei Pont. Patient has sickle cell trait as well as iron deficiency anemia, last checked in Jan. She also developed a mild thrombocytosis. Electrolytes were done 9 months prior which were normal, thyroid 2 years prior was normal, vitamin D 4 years prior was normal, and A1c 9 months prior was 5.6.   Patient states having right hand soreness between the 2nd and 3rd metacarpals ongoing for a few months. She notes feeling more soreness when opening bottles and containers when making a fist. She also notes area being a little swollen. She's been taking ibuprofen when the soreness occurs and applying aspercreme. She denies numbness or tingling.   She also reports right-sided facial twitching (longest twitch going for about 15 seconds) noticed a week ago. She notes having spasm over whole right side of her face and has been radiating towards her left eye. She denies this being pointed out to her by others, but she feels these spasms. She felt better yesterday but today, while at work, the twitching started up again. She believes this could be due to stress as she started a new job in January. She denies taking medications for cough or cold. She denies any changes at home. She denies headaches.   She has noticed a change in her vision. She previously wore glasses but noticed her vision  suddenly became 20/20 without glasses. She had 2 sets of glasses: usual glasses and computer glasses. She stopped using her glasses and was suggested to continue using her computer glasses; however, her computer glasses gave her headaches. She hasn't seen eye doctor since. But recently, she noticed she was straining her eyes again. She would like to be referred to [place].   Past Medical History  Diagnosis Date  . Asthma   . Allergy   . Anemia   . Hypertension    Prior to Admission medications   Medication Sig Start Date End Date Taking? Authorizing Provider  albuterol (PROVENTIL HFA;VENTOLIN HFA) 108 (90 BASE) MCG/ACT inhaler Inhale 2 puffs into the lungs every 4 (four) hours as needed for wheezing (cough, shortness of breath or wheezing.). 07/31/15   Darreld Mclean, MD  azithromycin (ZITHROMAX Z-PAK) 250 MG tablet Take 2 tablets first day then 1 every day 12/23/15   Elby Beck, FNP  chlorpheniramine-HYDROcodone (TUSSIONEX PENNKINETIC ER) 10-8 MG/5ML SUER Take 5 mLs by mouth every 12 (twelve) hours as needed for cough. 12/23/15   Elby Beck, FNP  lisinopril-hydrochlorothiazide (PRINZIDE,ZESTORETIC) 10-12.5 MG per tablet Take 1 tablet by mouth daily. 07/31/15   Gay Filler Copland, MD  meloxicam (MOBIC) 7.5 MG tablet Take 1 tablet (7.5 mg total) by mouth daily. As needed for knee pain 07/31/15   Darreld Mclean, MD  Multiple Vitamin (MULTIVITAMINS PO) Take  by mouth.    Historical Provider, MD  predniSONE (DELTASONE) 20 MG tablet Take 2 tablets (40 mg total) by mouth daily with breakfast. 12/23/15   Elby Beck, FNP   Allergies  Allergen Reactions  . Penicillins Anaphylaxis    Review of Systems  Constitutional: Negative for fever, chills, diaphoresis and fatigue.  HENT: Negative for facial swelling.   Eyes: Negative for pain and visual disturbance.  Musculoskeletal: Positive for myalgias and arthralgias. Negative for joint swelling.  Neurological: Negative for  weakness, numbness and headaches.       Objective:   Physical Exam  Constitutional: She is oriented to person, place, and time. She appears well-developed and well-nourished. No distress.  HENT:  Head: Normocephalic and atraumatic.  Eyes: EOM are normal. Pupils are equal, round, and reactive to light.  Neck: Neck supple.  Cardiovascular: Normal rate, regular rhythm and normal heart sounds.   No murmur heard. Pulmonary/Chest: Effort normal and breath sounds normal. No respiratory distress.  Musculoskeletal: Normal range of motion.  Right hand: painful more between 2nd and 3rd MCP's, joints feel normal, no effusion, normal mobility, no neuromas palpable; normal opposition, but pain with stress of lumbricals on the right  Neurological: She is alert and oriented to person, place, and time.  Skin: Skin is warm and dry.  Psychiatric: She has a normal mood and affect. Her behavior is normal.  Nursing note and vitals reviewed.   BP 114/70 mmHg  Pulse 98  Temp(Src) 98.9 F (37.2 C)  Resp 18  Ht 5\' 2"  (1.575 m)  Wt 231 lb (104.781 kg)  BMI 42.24 kg/m2  SpO2 99%  LMP 04/20/2016 (Approximate)   Dg Hand Complete Right  05/04/2016  CLINICAL DATA:  Right hand pain. EXAM: RIGHT HAND - COMPLETE 3+ VIEW COMPARISON:  None. FINDINGS: There is no evidence of fracture or dislocation. There is no evidence of arthropathy or other focal bone abnormality. Soft tissues are unremarkable. IMPRESSION: Negative. Electronically Signed   By: Lorriane Shire M.D.   On: 05/04/2016 18:31      Assessment & Plan:   1. Essential hypertension   2. Facial tic   3. Right hand pain   4. Anemia, iron deficiency   5. Chronic constipation   6. Medication monitoring encounter   7. Blurred vision   8. Other fatigue     Orders Placed This Encounter  Procedures  . DG Hand Complete Right    Standing Status: Future     Number of Occurrences: 1     Standing Expiration Date: 05/04/2017    Order Specific Question:   Reason for Exam (SYMPTOM  OR DIAGNOSIS REQUIRED)    Answer:  pain between 2nd and 3rd MCPs    Order Specific Question:  Is the patient pregnant?    Answer:  No    Order Specific Question:  Preferred imaging location?    Answer:  External  . CBC  . Comprehensive metabolic panel  . TSH  . Vitamin B12  . Sedimentation Rate  . C-reactive protein  . Ferritin  . CK  . Ambulatory referral to Ophthalmology    Referral Priority:  Routine    Referral Type:  Consultation    Referral Reason:  Specialty Services Required    Requested Specialty:  Ophthalmology    Number of Visits Requested:  1  . Care order/instruction    AVS and GO after xray done and labs drawn - please let pt know she will be called with results xray,  labs will be mailed unless abnormal - then call    Scheduling Instructions:     AVS and GO    Meds ordered this encounter  Medications  . lisinopril-hydrochlorothiazide (PRINZIDE,ZESTORETIC) 10-12.5 MG tablet    Sig: Take 1 tablet by mouth daily.    Dispense:  90 tablet    Refill:  3  . meloxicam (MOBIC) 15 MG tablet    Sig: Take 1 tablet (15 mg total) by mouth daily.    Dispense:  30 tablet    Refill:  1    I personally performed the services described in this documentation, which was scribed in my presence. The recorded information has been reviewed and considered, and addended by me as needed.   Heidi Robinson, M.D.  [facility] [place], [place] [address] [phone] phone [phone] fax  05/11/2016 7:04 AM  Results for orders placed or performed in visit on 05/04/16  CBC  Result Value Ref Range   WBC 13.0 (H) 3.8 - 10.8 K/uL   RBC 5.35 (H) 3.80 - 5.10 MIL/uL   Hemoglobin 11.4 (L) 11.7 - 15.5 g/dL   HCT 35.1 35.0 - 45.0 %   MCV 65.6 (L) 80.0 - 100.0 fL   MCH 21.3 (L) 27.0 - 33.0 pg   MCHC 32.5 32.0 - 36.0 g/dL   RDW 17.2 (H) 11.0 - 15.0 %   Platelets 423 (H) 140 - 400 K/uL   MPV Not Performed 7.5 - 12.5 fL    Comprehensive metabolic panel  Result Value Ref Range   Sodium 138 135 - 146 mmol/L   Potassium 3.7 3.5 - 5.3 mmol/L   Chloride 101 98 - 110 mmol/L   CO2 25 20 - 31 mmol/L   Glucose, Bld 81 65 - 99 mg/dL   BUN 12 7 - 25 mg/dL   Creat 0.85 0.50 - 1.10 mg/dL   Total Bilirubin 0.3 0.2 - 1.2 mg/dL   Alkaline Phosphatase 62 33 - 115 U/L   AST 13 10 - 30 U/L   ALT 11 6 - 29 U/L   Total Protein 7.3 6.1 - 8.1 g/dL   Albumin 4.5 3.6 - 5.1 g/dL   Calcium 9.4 8.6 - 10.2 mg/dL  TSH  Result Value Ref Range   TSH 1.62 mIU/L  Vitamin B12  Result Value Ref Range   Vitamin B-12 628 200 - 1100 pg/mL  Sedimentation Rate  Result Value Ref Range   Sed Rate 20 0 - 20 mm/hr  C-reactive protein  Result Value Ref Range   CRP 1.6 (H) <0.60 mg/dL  Ferritin  Result Value Ref Range   Ferritin 10 10 - 154 ng/mL  CK  Result Value Ref Range   Total CK 89 7 - 177 U/L

## 2016-05-05 LAB — COMPREHENSIVE METABOLIC PANEL
ALBUMIN: 4.5 g/dL (ref 3.6–5.1)
ALT: 11 U/L (ref 6–29)
AST: 13 U/L (ref 10–30)
Alkaline Phosphatase: 62 U/L (ref 33–115)
BUN: 12 mg/dL (ref 7–25)
CO2: 25 mmol/L (ref 20–31)
Calcium: 9.4 mg/dL (ref 8.6–10.2)
Chloride: 101 mmol/L (ref 98–110)
Creat: 0.85 mg/dL (ref 0.50–1.10)
Glucose, Bld: 81 mg/dL (ref 65–99)
Potassium: 3.7 mmol/L (ref 3.5–5.3)
SODIUM: 138 mmol/L (ref 135–146)
TOTAL PROTEIN: 7.3 g/dL (ref 6.1–8.1)
Total Bilirubin: 0.3 mg/dL (ref 0.2–1.2)

## 2016-05-05 LAB — CBC
HCT: 35.1 % (ref 35.0–45.0)
HEMOGLOBIN: 11.4 g/dL — AB (ref 11.7–15.5)
MCH: 21.3 pg — ABNORMAL LOW (ref 27.0–33.0)
MCHC: 32.5 g/dL (ref 32.0–36.0)
MCV: 65.6 fL — ABNORMAL LOW (ref 80.0–100.0)
Platelets: 423 10*3/uL — ABNORMAL HIGH (ref 140–400)
RBC: 5.35 MIL/uL — AB (ref 3.80–5.10)
RDW: 17.2 % — ABNORMAL HIGH (ref 11.0–15.0)
WBC: 13 10*3/uL — AB (ref 3.8–10.8)

## 2016-05-05 LAB — FERRITIN: FERRITIN: 10 ng/mL (ref 10–154)

## 2016-05-05 LAB — CK: CK TOTAL: 89 U/L (ref 7–177)

## 2016-05-05 LAB — SEDIMENTATION RATE: Sed Rate: 20 mm/hr (ref 0–20)

## 2016-05-05 LAB — TSH: TSH: 1.62 m[IU]/L

## 2016-05-05 LAB — C-REACTIVE PROTEIN: CRP: 1.6 mg/dL — AB (ref <0.60)

## 2016-05-05 LAB — VITAMIN B12: Vitamin B-12: 628 pg/mL (ref 200–1100)

## 2016-05-05 NOTE — Telephone Encounter (Signed)
Thank you :)

## 2016-05-14 ENCOUNTER — Telehealth: Payer: Self-pay

## 2016-05-14 ENCOUNTER — Encounter: Payer: Self-pay | Admitting: Family Medicine

## 2016-05-14 NOTE — Telephone Encounter (Signed)
Pt calling about lab results  

## 2016-05-14 NOTE — Telephone Encounter (Signed)
Labs commented on and letter mailed. Thanks

## 2016-07-24 ENCOUNTER — Ambulatory Visit (INDEPENDENT_AMBULATORY_CARE_PROVIDER_SITE_OTHER): Payer: 59 | Admitting: Family Medicine

## 2016-07-24 VITALS — BP 118/80 | HR 88 | Temp 97.9°F | Resp 18 | Ht 62.0 in | Wt 240.2 lb

## 2016-07-24 DIAGNOSIS — M170 Bilateral primary osteoarthritis of knee: Secondary | ICD-10-CM | POA: Diagnosis not present

## 2016-07-24 DIAGNOSIS — Z1389 Encounter for screening for other disorder: Secondary | ICD-10-CM

## 2016-07-24 DIAGNOSIS — N62 Hypertrophy of breast: Secondary | ICD-10-CM | POA: Diagnosis not present

## 2016-07-24 DIAGNOSIS — Z23 Encounter for immunization: Secondary | ICD-10-CM

## 2016-07-24 DIAGNOSIS — Z136 Encounter for screening for cardiovascular disorders: Secondary | ICD-10-CM

## 2016-07-24 DIAGNOSIS — Z0000 Encounter for general adult medical examination without abnormal findings: Secondary | ICD-10-CM

## 2016-07-24 DIAGNOSIS — D509 Iron deficiency anemia, unspecified: Secondary | ICD-10-CM | POA: Diagnosis not present

## 2016-07-24 DIAGNOSIS — J4521 Mild intermittent asthma with (acute) exacerbation: Secondary | ICD-10-CM | POA: Diagnosis not present

## 2016-07-24 DIAGNOSIS — Z1329 Encounter for screening for other suspected endocrine disorder: Secondary | ICD-10-CM | POA: Diagnosis not present

## 2016-07-24 DIAGNOSIS — Z1383 Encounter for screening for respiratory disorder NEC: Secondary | ICD-10-CM | POA: Diagnosis not present

## 2016-07-24 DIAGNOSIS — M549 Dorsalgia, unspecified: Secondary | ICD-10-CM

## 2016-07-24 LAB — COMPREHENSIVE METABOLIC PANEL
ALBUMIN: 4.3 g/dL (ref 3.6–5.1)
ALK PHOS: 56 U/L (ref 33–115)
ALT: 9 U/L (ref 6–29)
AST: 13 U/L (ref 10–30)
BUN: 11 mg/dL (ref 7–25)
CO2: 25 mmol/L (ref 20–31)
CREATININE: 0.8 mg/dL (ref 0.50–1.10)
Calcium: 9.4 mg/dL (ref 8.6–10.2)
Chloride: 100 mmol/L (ref 98–110)
Glucose, Bld: 82 mg/dL (ref 65–99)
POTASSIUM: 4.3 mmol/L (ref 3.5–5.3)
SODIUM: 138 mmol/L (ref 135–146)
TOTAL PROTEIN: 7 g/dL (ref 6.1–8.1)
Total Bilirubin: 0.4 mg/dL (ref 0.2–1.2)

## 2016-07-24 LAB — POCT URINALYSIS DIP (MANUAL ENTRY)
BILIRUBIN UA: NEGATIVE
BILIRUBIN UA: NEGATIVE
Blood, UA: NEGATIVE
GLUCOSE UA: NEGATIVE
LEUKOCYTES UA: NEGATIVE
NITRITE UA: NEGATIVE
PH UA: 6.5
Protein Ur, POC: NEGATIVE
Spec Grav, UA: 1.01
Urobilinogen, UA: 0.2

## 2016-07-24 LAB — LIPID PANEL
Cholesterol: 182 mg/dL (ref 125–200)
HDL: 50 mg/dL (ref >=46)
LDL Cholesterol: 105 mg/dL (ref <130)
Total CHOL/HDL Ratio: 3.6 Ratio (ref <=5.0)
Triglycerides: 135 mg/dL (ref <150)
VLDL: 27 mg/dL (ref <30)

## 2016-07-24 LAB — CBC
HCT: 37.2 % (ref 35.0–45.0)
HEMOGLOBIN: 12.5 g/dL (ref 11.7–15.5)
MCH: 23.1 pg — ABNORMAL LOW (ref 27.0–33.0)
MCHC: 33.6 g/dL (ref 32.0–36.0)
MCV: 68.6 fL — AB (ref 80.0–100.0)
MPV: 10.3 fL (ref 7.5–12.5)
Platelets: 380 10*3/uL (ref 140–400)
RBC: 5.42 MIL/uL — AB (ref 3.80–5.10)
RDW: 16.8 % — ABNORMAL HIGH (ref 11.0–15.0)
WBC: 11.5 10*3/uL — ABNORMAL HIGH (ref 3.8–10.8)

## 2016-07-24 LAB — POCT GLYCOSYLATED HEMOGLOBIN (HGB A1C): HEMOGLOBIN A1C: 5.3

## 2016-07-24 LAB — FERRITIN: FERRITIN: 24 ng/mL (ref 10–154)

## 2016-07-24 MED ORDER — MELOXICAM 15 MG PO TABS: 15.0000 mg | ORAL_TABLET | Freq: Every day | ORAL | 5 refills | Status: DC | PRN

## 2016-07-24 MED ORDER — ALBUTEROL SULFATE HFA 108 (90 BASE) MCG/ACT IN AERS: 2.0000 | INHALATION_SPRAY | RESPIRATORY_TRACT | 3 refills | Status: DC | PRN

## 2016-07-24 MED ORDER — METFORMIN HCL 500 MG PO TABS: 500.0000 mg | ORAL_TABLET | Freq: Two times a day (BID) | ORAL | 1 refills | Status: DC

## 2016-07-24 NOTE — Progress Notes (Signed)
Subjective:  By signing my name below, I, Heidi Robinson, attest that this documentation has been prepared under the direction and in the presence of [name], MD. Electronically Signed: [name], [place]. 07/24/2016 , 12:17 PM .  Patient was seen in Room 14 .   Patient ID: Heidi ETO, female    DOB: 11-26-1975, 40 y.o.   MRN: [id number] Chief Complaint  Patient presents with  . Annual Exam    no pap smear   HPI Heidi Robinson is a 40 y.o. female who presents to [facility] for annual physical. She usually has CPE annually, was previously seeing Dr. Lorelei Pont for last several years. She needs to be on family planning due to ace use. She has a history of sickle cell trait and iron deficiency anemia. She had mammogram done in Nov 2015 which was normal; repeat when she's 40 years old. She is fasting today. She is followed by her GYN Dr. Ronita Hipps for pap and mammogram.   Medications and supplements She denies taking vitamin D supplements. She takes excedrin tension occasionally when she has headaches.   HTN EKG was normal last year. She's on lisinopril-HCTZ 10-12.5mg .   Asthma She uses prn albuterol.   Obesity She was on NuBody last year. She was doing a lot of detox, but she was still gaining weight. She believes her obesity is through genetics as her mother was obese as well. She was doing a strict diet limiting carbs with supplements. She's tried phentermine but caused her to have palpitations. Her GYN Dr. Ronita Hipps recommended her trying metformin. She tried hormonal assistance in the past but caused her to break out in acne. last year was on NuBody  Exercise She started exercising with walking this week but it causes her knees to be in pain. She walks with a friend from church. She's currently out of her mobic.   Breast reduction She noticed her breasts growing and causing it difficult to breathe, and back pain. She has to wear 2 bras in order for her to exercise.   OA She takes meloxicam.    Immunizations Immunization History  Administered Date(s) Administered  . Influenza,inj,Quad PF,36+ Mos 07/31/2015  . Pneumococcal Polysaccharide-23 05/19/2013  . Tdap 11/09/2008   She agrees to flu shot today.   Past Medical History:  Diagnosis Date  . Allergy   . Anemia   . Asthma   . Hypertension    Prior to Admission medications   Medication Sig Start Date End Date Taking? Authorizing Provider  albuterol (PROVENTIL HFA;VENTOLIN HFA) 108 (90 BASE) MCG/ACT inhaler Inhale 2 puffs into the lungs every 4 (four) hours as needed for wheezing (cough, shortness of breath or wheezing.). 07/31/15  Yes Gay Filler Copland, MD  lisinopril-hydrochlorothiazide (PRINZIDE,ZESTORETIC) 10-12.5 MG tablet Take 1 tablet by mouth daily. 05/04/16  Yes [name], MD  meloxicam (MOBIC) 15 MG tablet Take 1 tablet (15 mg total) by mouth daily. 05/04/16  Yes [name], MD  Multiple Vitamin (MULTIVITAMINS PO) Take by mouth.   Yes Historical Provider, MD   Allergies  Allergen Reactions  . Penicillins Anaphylaxis   Past Surgical History:  Procedure Laterality Date  . tonsillecomy    . TONSILLECTOMY     Family History  Problem Relation Age of Onset  . Hypertension Mother   . Kidney disease Brother   . Hypertension Maternal Grandmother   . Cancer Maternal Grandmother     bladder  . Hyperlipidemia Father   . Cancer Paternal Grandfather  prostate   Social History   Social History  . Marital status: Married    Spouse name: N/A  . Number of children: N/A  . Years of education: N/A   Occupational History  . [employer]    Social History Main Topics  . Smoking status: Former Smoker    Years: 2.00    Types: Cigarettes    Quit date: 07/11/2003  . Smokeless tobacco: None  . Alcohol use No  . Drug use: No  . Sexual activity: Yes   Other Topics Concern  . None   Social History Narrative   Married with 2 children. Patient has a Financial risk analyst. Exercise: Cardio 2-3 times a week for  30 minutes.   Depression screen [facility] 2/9 07/24/2016 05/04/2016 12/23/2015 06/04/2014  Decreased Interest 0 0 0 0  Down, Depressed, Hopeless 0 1 0 0  PHQ - 2 Score 0 1 0 0    Review of Systems  Constitutional: Negative for fatigue and unexpected weight change.  Respiratory: Positive for shortness of breath. Negative for chest tightness.   Cardiovascular: Negative for chest pain, palpitations and leg swelling.  Gastrointestinal: Negative for abdominal pain and Robinson in stool.  Musculoskeletal: Positive for arthralgias and back pain.  Neurological: Negative for dizziness, syncope, light-headedness and headaches.  All other systems reviewed and are negative.      Objective:   Physical Exam  Constitutional: She is oriented to person, place, and time. She appears well-developed and well-nourished. No distress.  HENT:  Head: Normocephalic and atraumatic.  Right Ear: Tympanic membrane is erythematous.  Left Ear: Tympanic membrane is erythematous.  Nares with erythema,   Eyes: EOM are normal. Pupils are equal, round, and reactive to light.  Neck: Neck supple.  Cardiovascular: Normal rate, regular rhythm, S1 normal, S2 normal and normal heart sounds.   No murmur heard. Pulmonary/Chest: Effort normal and breath sounds normal. No respiratory distress.  Abdominal: Bowel sounds are normal.  Musculoskeletal: Normal range of motion.  Neurological: She is alert and oriented to person, place, and time.  Reflex Scores:      Patellar reflexes are 2+ on the right side and 2+ on the left side. Skin: Skin is warm and dry.  Psychiatric: She has a normal mood and affect. Her behavior is normal.  Nursing note and vitals reviewed.   BP 118/80   Pulse 88   Temp 97.9 F (36.6 C) (Oral)   Resp 18   Ht 5\' 2"  (1.575 m)   Wt 240 lb 3.2 oz (109 kg)   LMP 07/17/2016   SpO2 98%   BMI 43.93 kg/m     Assessment & Plan:  Patient needs total cholesterol, triglycerides, LDL, HDL and A1c on her form. Okay to  refill lisinopril hctz for 6 months when due spring 2018  1. Annual physical exam   2. Morbid obesity, unspecified obesity type ([place]) - look into [facility] program vs [place]-type program. Also rec pt attend bariatric surg info seminar.  Increase exercise.  Start trial of metformin to hopefully help with weightloss  3. Screening for cardiovascular, respiratory, and genitourinary diseases   4. Screening for thyroid disorder   5. Anemia, iron deficiency   6. Macromastia - I do think pt would have much improvement in her ability to exercise and loose weight with a breast reduction. Currently has chronic upper back pain from macromastia and knee pain from weight. Refer to plastics  7. Bilateral back pain, unspecified location   8. Primary  osteoarthritis of both knees   9. Asthma with acute exacerbation, mild intermittent   10. Need for prophylactic vaccination and inoculation against influenza     Orders Placed This Encounter  Procedures  . Flu Vaccine QUAD 36+ mos IM  . CBC  . Comprehensive metabolic panel    Order Specific Question:   Has the patient fasted?    Answer:   Yes  . Lipid panel    Order Specific Question:   Has the patient fasted?    Answer:   Yes  . Thyroid Panel With TSH  . Ferritin  . Ambulatory referral to Plastic Surgery    Referral Priority:   Routine    Referral Type:   Surgical    Referral Reason:   Specialty Services Required    Requested Specialty:   Plastic Surgery    Number of Visits Requested:   1  . Care order/instruction:    AVS printed - please let pt go after vaccine is given and labs are drawn  . POCT glycosylated hemoglobin (Hb A1C)  . POCT urinalysis dipstick    Meds ordered this encounter  Medications  . meloxicam (MOBIC) 15 MG tablet    Sig: Take 1 tablet (15 mg total) by mouth daily as needed for pain.    Dispense:  30 tablet    Refill:  5  . albuterol (PROVENTIL HFA;VENTOLIN HFA) 108 (90 Base) MCG/ACT inhaler    Sig: Inhale 2  puffs into the lungs every 4 (four) hours as needed for wheezing (cough, shortness of breath or wheezing.).    Dispense:  1 Inhaler    Refill:  3  . metFORMIN (GLUCOPHAGE) 500 MG tablet    Sig: Take 1 tablet (500 mg total) by mouth 2 (two) times daily with a meal.    Dispense:  180 tablet    Refill:  1    I personally performed the services described in this documentation, which was scribed in my presence. The recorded information has been reviewed and considered, and addended by me as needed.   [name], M.D.  [facility] [name] [place],  [address] [phone] phone [phone] fax  08/08/16 11:23 PM  Results for orders placed or performed in visit on 07/24/16  CBC  Result Value Ref Range   WBC 11.5 (H) 3.8 - 10.8 K/uL   RBC 5.42 (H) 3.80 - 5.10 MIL/uL   Hemoglobin 12.5 11.7 - 15.5 g/dL   HCT 37.2 35.0 - 45.0 %   MCV 68.6 (L) 80.0 - 100.0 fL   MCH 23.1 (L) 27.0 - 33.0 pg   MCHC 33.6 32.0 - 36.0 g/dL   RDW 16.8 (H) 11.0 - 15.0 %   Platelets 380 140 - 400 K/uL   MPV 10.3 7.5 - 12.5 fL  Comprehensive metabolic panel  Result Value Ref Range   Sodium 138 135 - 146 mmol/L   Potassium 4.3 3.5 - 5.3 mmol/L   Chloride 100 98 - 110 mmol/L   CO2 25 20 - 31 mmol/L   Glucose, Bld 82 65 - 99 mg/dL   BUN 11 7 - 25 mg/dL   Creat 0.80 0.50 - 1.10 mg/dL   Total Bilirubin 0.4 0.2 - 1.2 mg/dL   Alkaline Phosphatase 56 33 - 115 U/L   AST 13 10 - 30 U/L   ALT 9 6 - 29 U/L   Total Protein 7.0 6.1 - 8.1 g/dL   Albumin 4.3 3.6 -  5.1 g/dL   Calcium 9.4 8.6 - 10.2 mg/dL  Lipid panel  Result Value Ref Range   Cholesterol 182 125 - 200 mg/dL   Triglycerides 135 <150 mg/dL   HDL 50 >=46 mg/dL   Total CHOL/HDL Ratio 3.6 <=5.0 Ratio   VLDL 27 <30 mg/dL   LDL Cholesterol 105 <130 mg/dL  Thyroid Panel With TSH  Result Value Ref Range   T4, Total 9.1 4.5 - 12.0 ug/dL   T3 Uptake 26 22 - 35 %   Free Thyroxine Index 2.4 1.4 - 3.8   TSH 0.96  mIU/L  Ferritin  Result Value Ref Range   Ferritin 24 10 - 154 ng/mL  POCT glycosylated hemoglobin (Hb A1C)  Result Value Ref Range   Hemoglobin A1C 5.3   POCT urinalysis dipstick  Result Value Ref Range   Color, UA yellow yellow   Clarity, UA clear clear   Glucose, UA negative negative   Bilirubin, UA negative negative   Ketones, POC UA negative negative   Spec Grav, UA 1.010    Robinson, UA negative negative   pH, UA 6.5    Protein Ur, POC negative negative   Urobilinogen, UA 0.2    Nitrite, UA Negative Negative   Leukocytes, UA Negative Negative

## 2016-07-24 NOTE — Progress Notes (Signed)
Gets CPE annually Was seeing Dr. Lorelei Pont for last sev years nneds to be on family pl;anning due to ace use.  HTN:  EKG nml last year, on lisinopril-hctz 10-12.5 Asthma: prn albuterol Obesity - last year was on NuBody OA: meloxicam

## 2016-07-24 NOTE — Patient Instructions (Addendum)
[facility] has a new weight loss clinic in [place] on [address]. So far, they seem to be getting good results and since it is medically based your insurance might provide better coverage.  It is probably worth checking to see what your ins coverage is. It is called [employer] and you can call [phone] option 1 for more info.   IF you received an x-ray today, you will receive an invoice from [facility]. Please contact [facility] at [phone] with questions or concerns regarding your invoice.   IF you received labwork today, you will receive an invoice from [employer]. Please contact [facility] at [phone] with questions or concerns regarding your invoice.   Our billing staff will not be able to assist you with questions regarding bills from these companies.  You will be contacted with the lab results as soon as they are available. The fastest way to get your results is to activate your My Chart account. Instructions are located on the last page of this paperwork. If you have not heard from [place] regarding the results in 2 weeks, please contact this office.      Exercising to Stay Healthy Exercising regularly is important. It has many health benefits, such as:  Improving your overall fitness, flexibility, and endurance.  Increasing your bone density.  Helping with weight control.  Decreasing your body fat.  Increasing your muscle strength.  Reducing stress and tension.  Improving your overall health. In order to become healthy and stay healthy, it is recommended that you do moderate-intensity and vigorous-intensity exercise. You can tell that you are exercising at a moderate intensity if you have a higher heart rate and faster breathing, but you are still able to hold a conversation. You can tell that you are exercising at a vigorous intensity if you are breathing much harder and faster and cannot hold a  conversation while exercising. HOW OFTEN SHOULD I EXERCISE? Choose an activity that you enjoy and set realistic goals. Your health care provider can help you to make an activity plan that works for you. Exercise regularly as directed by your health care provider. This may include:   Doing resistance training twice each week, such as:  Push-ups.  Sit-ups.  Lifting weights.  Using resistance bands.  Doing a given intensity of exercise for a given amount of time. Choose from these options:  150 minutes of moderate-intensity exercise every week.  75 minutes of vigorous-intensity exercise every week.  A mix of moderate-intensity and vigorous-intensity exercise every week. Children, pregnant women, people who are out of shape, people who are overweight, and older adults may need to consult a health care provider for individual recommendations. If you have any sort of medical condition, be sure to consult your health care provider before starting a new exercise program.  WHAT ARE SOME EXERCISE IDEAS? Some moderate-intensity exercise ideas include:   Walking at a rate of 1 mile in 15 minutes.  Biking.  Hiking.  Golfing.  Dancing. Some vigorous-intensity exercise ideas include:   Walking at a rate of at least 4.5 miles per hour.  Jogging or running at a rate of 5 miles per hour.  Biking at a rate of at least 10 miles per hour.  Lap swimming.  Roller-skating or in-line skating.  Cross-country skiing.  Vigorous competitive sports, such as football, basketball, and soccer.  Jumping rope.  Aerobic dancing. WHAT ARE SOME EVERYDAY ACTIVITIES THAT CAN HELP ME TO GET EXERCISE?  Yard work, such as:  Psychologist, educational.  Raking and bagging leaves.  Washing and waxing your car.  Pushing a stroller.  Shoveling snow.  Gardening.  Washing windows or floors. HOW CAN I BE MORE ACTIVE IN MY DAY-TO-DAY ACTIVITIES?  Use the stairs instead of the elevator.  Take a walk  during your lunch break.  If you drive, park your car farther away from work or school.  If you take public transportation, get off one stop early and walk the rest of the way.  Make all of your phone calls while standing up and walking around.  Get up, stretch, and walk around every 30 minutes throughout the day. WHAT GUIDELINES SHOULD I FOLLOW WHILE EXERCISING?  Do not exercise so much that you hurt yourself, feel dizzy, or get very short of breath.  Consult your health care provider before starting a new exercise program.  Wear comfortable clothes and shoes with good support.  Drink plenty of water while you exercise to prevent dehydration or heat stroke. Body water is lost during exercise and must be replaced.  Work out until you breathe faster and your heart beats faster.   This information is not intended to replace advice given to you by your health care provider. Make sure you discuss any questions you have with your health care provider.   Document Released: 11/28/2010 Document Revised: 11/16/2014 Document Reviewed: 03/29/2014 Elsevier Interactive Patient Education [employer].

## 2016-07-25 LAB — THYROID PANEL WITH TSH
Free Thyroxine Index: 2.4 (ref 1.4–3.8)
T3 UPTAKE: 26 % (ref 22–35)
T4, Total: 9.1 ug/dL (ref 4.5–12.0)
TSH: 0.96 mIU/L

## 2016-07-27 ENCOUNTER — Ambulatory Visit: Payer: 59

## 2016-08-03 ENCOUNTER — Encounter: Payer: Self-pay | Admitting: Family Medicine

## 2016-08-27 ENCOUNTER — Encounter: Payer: Self-pay | Admitting: Plastic Surgery

## 2016-09-11 ENCOUNTER — Other Ambulatory Visit: Payer: Self-pay | Admitting: Family Medicine

## 2016-09-11 DIAGNOSIS — Z1231 Encounter for screening mammogram for malignant neoplasm of breast: Secondary | ICD-10-CM

## 2016-10-12 ENCOUNTER — Ambulatory Visit
Admission: RE | Admit: 2016-10-12 | Discharge: 2016-10-12 | Disposition: A | Discharge status: Home / Self Care | Payer: 59 | Source: Ambulatory Visit | Attending: Family Medicine | Admitting: Family Medicine

## 2016-10-12 DIAGNOSIS — Z1231 Encounter for screening mammogram for malignant neoplasm of breast: Secondary | ICD-10-CM

## 2016-10-14 ENCOUNTER — Other Ambulatory Visit: Payer: Self-pay | Admitting: Family Medicine

## 2016-10-14 DIAGNOSIS — R928 Other abnormal and inconclusive findings on diagnostic imaging of breast: Secondary | ICD-10-CM

## 2016-10-16 ENCOUNTER — Ambulatory Visit
Admission: RE | Admit: 2016-10-16 | Discharge: 2016-10-16 | Disposition: A | Discharge status: Home / Self Care | Payer: 59 | Source: Ambulatory Visit | Attending: Family Medicine | Admitting: Family Medicine

## 2016-10-16 DIAGNOSIS — R928 Other abnormal and inconclusive findings on diagnostic imaging of breast: Secondary | ICD-10-CM

## 2017-05-07 ENCOUNTER — Other Ambulatory Visit: Payer: Self-pay | Admitting: Family Medicine

## 2017-05-07 ENCOUNTER — Telehealth: Payer: Self-pay | Admitting: Family Medicine

## 2017-05-07 DIAGNOSIS — I10 Essential (primary) hypertension: Secondary | ICD-10-CM

## 2017-05-07 NOTE — Telephone Encounter (Signed)
Pt needs a refill on her lisinopril.  Pt thought she had another refill, but she doesn't.  She is about to leave town and needs this asap.  [phone]

## 2017-05-07 NOTE — Telephone Encounter (Signed)
Pt notified a Rx was sent to [facility] at 7:08 this morning. I advised her to call and check and if they didn't get it to call me back. Pt verbalized understanding

## 2017-08-03 ENCOUNTER — Encounter: Payer: Self-pay | Admitting: Family Medicine

## 2017-08-03 DIAGNOSIS — D500 Iron deficiency anemia secondary to blood loss (chronic): Secondary | ICD-10-CM | POA: Insufficient documentation

## 2017-08-03 DIAGNOSIS — E7800 Pure hypercholesterolemia, unspecified: Secondary | ICD-10-CM | POA: Insufficient documentation

## 2017-08-03 NOTE — Progress Notes (Deleted)
Subjective:    Patient ID: Heidi Robinson, female    DOB: [birth year], 41 y.o.   MRN: [id number]   No chief complaint on file.  HPI Heidi Robinson is a 41 y.o. female who presents to [facility] for her annual physical.   Primary Preventative Screenings: Cervical Cancer: sees gyn Dr. Ronita Hipps Family Planning: NECESSARY due to acei use. STI screening: by gyn Dr. Ronita Hipps Breast Cancer: nml mam 09/2014, done by gyn Dr. Ronita Hipps Colorectal Cancer: NA 2/2 age Tobacco use/EtOH/substances: Bone Density: NA 2/2 age Cardiac: EKG 07/31/15 nml Weight/Blood sugar/Diet/Exercise: see below under obesity. a1c last yr nml at 5.3 OTC/Vit/Supp/Herbal: prn excedrin for HAs, on iron supp? (h/o IDA) Dentist/Optho: Immunizations:  Immunization History  Administered Date(s) Administered  . Influenza,inj,Quad PF,6+ Mos 07/31/2015, 07/24/2016  . Pneumococcal Polysaccharide-23 05/19/2013  . Tdap 11/09/2008     Chronic Medical Conditions: HTN: lisinopril-HCTZ 10-12.5mg .   Asthma: prn albuterol.   Obesity: Failed NuBody in 2016. Still gained weight doing different "detoxes". She believes her obesity is through genetics as her mother was obese as well. She was doing a strict diet limiting carbs with supplements. She's tried phentermine but caused her to have palpitations. tried hormonal assistance in the past but caused her to break out in acne.  We tried on her metformin 500 bid last yr though a1c was nml (her gyn also rec this) but pt never returned for recheck. I had recommended pt look into the [facility] [employer] program or cons Jennie Craig/Weight[employer], even cons attending a bariatric surg info session.  Macromastia: Pt felt this was worsening and limiting her ability to exercise and causing chronic back/neck pain as well as [facility] so referred to plastic surg last yr.   OA: prn meloxicam for knee pain when exercising   Past Medical History:  Diagnosis Date  . Allergy   . Anemia   . Asthma   . Hypertension    Prior  to Admission medications   Medication Sig Start Date End Date Taking? Authorizing Provider  albuterol (PROVENTIL HFA;VENTOLIN HFA) 108 (90 BASE) MCG/ACT inhaler Inhale 2 puffs into the lungs every 4 (four) hours as needed for wheezing (cough, shortness of breath or wheezing.). 07/31/15  Yes Gay Filler Copland, MD  lisinopril-hydrochlorothiazide (PRINZIDE,ZESTORETIC) 10-12.5 MG tablet Take 1 tablet by mouth daily. 05/04/16  Yes [name], MD  meloxicam (MOBIC) 15 MG tablet Take 1 tablet (15 mg total) by mouth daily. 05/04/16  Yes [name], MD  Multiple Vitamin (MULTIVITAMINS PO) Take by mouth.   Yes Historical Provider, MD   Allergies  Allergen Reactions  . Penicillins Anaphylaxis   Past Surgical History:  Procedure Laterality Date  . tonsillecomy    . TONSILLECTOMY     Family History  Problem Relation Age of Onset  . Hypertension Mother   . Kidney disease Brother   . Hypertension Maternal Grandmother   . Cancer Maternal Grandmother        bladder  . Hyperlipidemia Father   . Cancer Paternal Grandfather        prostate   Social History   Social History  . Marital status: Married    Spouse name: N/A  . Number of children: N/A  . Years of education: N/A   Occupational History  . [employer]    Social History Main Topics  . Smoking status: Former Smoker    Years: 2.00    Types: Cigarettes    Quit date: 07/11/2003  . Smokeless tobacco: Not on  file  . Alcohol use No  . Drug use: No  . Sexual activity: Yes   Other Topics Concern  . Not on file   Social History Narrative   Married with 2 children. Patient has a Financial risk analyst. Exercise: Cardio 2-3 times a week for 30 minutes.   Depression screen [facility] 2/9 07/24/2016 05/04/2016 12/23/2015 06/04/2014  Decreased Interest 0 0 0 0  Down, Depressed, Hopeless 0 1 0 0  PHQ - 2 Score 0 1 0 0    Review of Systems  Constitutional: Negative for fatigue and unexpected weight change.  Respiratory: Positive for shortness of  breath. Negative for chest tightness.   Cardiovascular: Negative for chest pain, palpitations and leg swelling.  Gastrointestinal: Negative for abdominal pain and blood in stool.  Musculoskeletal: Positive for arthralgias and back pain.  Neurological: Negative for dizziness, syncope, light-headedness and headaches.  All other systems reviewed and are negative.      Objective:   Physical Exam  Constitutional: She is oriented to person, place, and time. She appears well-developed and well-nourished. No distress.  HENT:  Head: Normocephalic and atraumatic.  Right Ear: Tympanic membrane is erythematous.  Left Ear: Tympanic membrane is erythematous.  Nares with erythema,   Eyes: Pupils are equal, round, and reactive to light. EOM are normal.  Neck: Neck supple.  Cardiovascular: Normal rate, regular rhythm, S1 normal, S2 normal and normal heart sounds.   No murmur heard. Pulmonary/Chest: Effort normal and breath sounds normal. No respiratory distress.  Abdominal: Bowel sounds are normal.  Musculoskeletal: Normal range of motion.  Neurological: She is alert and oriented to person, place, and time.  Reflex Scores:      Patellar reflexes are 2+ on the right side and 2+ on the left side. Skin: Skin is warm and dry.  Psychiatric: She has a normal mood and affect. Her behavior is normal.  Nursing note and vitals reviewed.   There were no vitals taken for this visit.    nml D 2013, nml B12 2017 Assessment & Plan:  Ua, cbc, ferritin (low last yr), lipids (inc last yr), cmp, tsh Flu shot Add on HIV???  [name], M.D.  [facility] [address] [place],  [id number] [phone] phone [phone] fax  08/03/17 5:58 PM

## 2017-08-04 ENCOUNTER — Ambulatory Visit (INDEPENDENT_AMBULATORY_CARE_PROVIDER_SITE_OTHER): Payer: 59 | Admitting: Family Medicine

## 2017-08-04 ENCOUNTER — Encounter: Payer: Self-pay | Admitting: Family Medicine

## 2017-08-04 VITALS — BP 128/78 | HR 93 | Temp 98.3°F | Resp 18 | Ht 62.0 in | Wt 253.6 lb

## 2017-08-04 DIAGNOSIS — Z113 Encounter for screening for infections with a predominantly sexual mode of transmission: Secondary | ICD-10-CM | POA: Diagnosis not present

## 2017-08-04 DIAGNOSIS — E669 Obesity, unspecified: Secondary | ICD-10-CM

## 2017-08-04 DIAGNOSIS — Z0000 Encounter for general adult medical examination without abnormal findings: Secondary | ICD-10-CM

## 2017-08-04 DIAGNOSIS — Z1329 Encounter for screening for other suspected endocrine disorder: Secondary | ICD-10-CM | POA: Diagnosis not present

## 2017-08-04 DIAGNOSIS — Z136 Encounter for screening for cardiovascular disorders: Secondary | ICD-10-CM | POA: Diagnosis not present

## 2017-08-04 DIAGNOSIS — D500 Iron deficiency anemia secondary to blood loss (chronic): Secondary | ICD-10-CM | POA: Diagnosis not present

## 2017-08-04 DIAGNOSIS — Z23 Encounter for immunization: Secondary | ICD-10-CM | POA: Diagnosis not present

## 2017-08-04 DIAGNOSIS — Z5181 Encounter for therapeutic drug level monitoring: Secondary | ICD-10-CM

## 2017-08-04 DIAGNOSIS — N62 Hypertrophy of breast: Secondary | ICD-10-CM

## 2017-08-04 DIAGNOSIS — Z1383 Encounter for screening for respiratory disorder NEC: Secondary | ICD-10-CM

## 2017-08-04 DIAGNOSIS — I10 Essential (primary) hypertension: Secondary | ICD-10-CM

## 2017-08-04 DIAGNOSIS — E7800 Pure hypercholesterolemia, unspecified: Secondary | ICD-10-CM

## 2017-08-04 DIAGNOSIS — Z1231 Encounter for screening mammogram for malignant neoplasm of breast: Secondary | ICD-10-CM

## 2017-08-04 DIAGNOSIS — Z1389 Encounter for screening for other disorder: Secondary | ICD-10-CM

## 2017-08-04 LAB — POCT URINALYSIS DIP (MANUAL ENTRY)
BILIRUBIN UA: NEGATIVE
Glucose, UA: NEGATIVE mg/dL
Ketones, POC UA: NEGATIVE mg/dL
NITRITE UA: NEGATIVE
Protein Ur, POC: 30 mg/dL — AB
SPEC GRAV UA: 1.01 (ref 1.010–1.025)
UROBILINOGEN UA: 0.2 U/dL
pH, UA: 5.5 (ref 5.0–8.0)

## 2017-08-04 MED ORDER — MELOXICAM 15 MG PO TABS: 15.0000 mg | ORAL_TABLET | Freq: Every day | ORAL | 5 refills | Status: DC | PRN

## 2017-08-04 MED ORDER — NORETHIN ACE-ETH ESTRAD-FE 1.5-30 MG-MCG PO TABS: 1.0000 | ORAL_TABLET | Freq: Every day | ORAL | 3 refills | Status: DC

## 2017-08-04 MED ORDER — ALBUTEROL SULFATE HFA 108 (90 BASE) MCG/ACT IN AERS: 2.0000 | INHALATION_SPRAY | RESPIRATORY_TRACT | 3 refills | Status: DC | PRN

## 2017-08-04 MED ORDER — LISINOPRIL-HYDROCHLOROTHIAZIDE 10-12.5 MG PO TABS: 1.0000 | ORAL_TABLET | Freq: Every day | ORAL | 3 refills | Status: DC

## 2017-08-04 NOTE — Progress Notes (Signed)
Subjective:  By signing my name below, I, Heidi Robinson, attest that this documentation has been prepared under the direction and in the presence of [name], MD Electronically Signed: Ladene Artist, ED Scribe 08/04/2017 at 8:21 AM.   Patient ID: Heidi Robinson, female    DOB: 01/12/1976, 41 y.o.   MRN: [id number]   Chief Complaint  Patient presents with  . Annual Exam   HPI Heidi Robinson is a 41 y.o. female who presents to [facility] for her annual physical. Pt is fasting at this visit.  Primary Preventative Screenings: Cervical Cancer: sees gyn Dr. Ronita Hipps Family Planning: NECESSARY due to acei use. Pt is currently using condoms but considering partial hysterectomy due to fibroids. Agrees to trying BC pills at this visit. Reports long and painful menstrual periods.  STI screening: by gyn Dr. Ronita Hipps. Declines STI screening at this visit.  Breast Cancer: nml mam 09/2014, done by gyn Dr. Ronita Hipps. Mam done at [place] December 2017; repeat in 1 year. Colorectal Cancer: NA 2/2 age Tobacco use/EtOH/substances: denies tobacco use or second-hand smoke exposure  Bone Density: NA 2/2 age Cardiac: EKG 07/31/15 nml Weight/Blood sugar/Diet/Exercise: see below under obesity. a1c last yr nml at 5.3.  OTC/Vit/Supp/Herbal: prn excedrin for HAs, on iron supp once daily (h/o IDA) Dentist/Optho: sees regularly  Immunizations: Immunization History  Administered Date(s) Administered  . Influenza,inj,Quad PF,6+ Mos 07/31/2015, 07/24/2016  . Pneumococcal Polysaccharide-23 05/19/2013  . Tdap 11/09/2008   Chronic Medical Conditions: HTN: lisinopril-HCTZ 10-12.5mg . She does not have a home cuff or check her BP outside of the office.  Asthma: prn albuterol.  Obesity: Failed NuBody in 2016. Still gained weight doing different "detoxes". She believes her obesity is through genetics as her mother was obese as well. She was doing a strict diet limiting carbs with supplements. She's tried phentermine but caused  her to have palpitations. tried hormonal assistance in the past but caused her to break out in acne. We tried on her metformin 500 bid last yr though a1c was nml (her gyn also rec this) but pt never returned for recheck. Pt stopped metformin; did not have side-effects but states she did not notice weight loss although she does not believe she took it long enough to notice a difference. I had recommended pt look into the [facility] By[employer] program or cons Jennie Craig/WeightWatchers, even cons attending a bariatric surg info session.  Macromastia: Pt felt this was worsening and limiting her ability to exercise and causing chronic back/neck pain as well as [facility] so referred to plastic surg last yr.   OA: prn meloxicam for knee pain when exercising   Pt's daughter is expecting a daughter in November.   Past Medical History:  Diagnosis Date  . Allergy   . Anemia   . Asthma   . Hypertension   . Iron deficiency anemia due to chronic blood loss   . Sickle cell trait ([place]) 06/04/2014   hemoglobin electrophoresis confirmed   Prior to Admission medications   Medication Sig Start Date End Date Taking? Authorizing Provider  albuterol (PROVENTIL HFA;VENTOLIN HFA) 108 (90 BASE) MCG/ACT inhaler Inhale 2 puffs into the lungs every 4 (four) hours as needed for wheezing (cough, shortness of breath or wheezing.). 07/31/15  Yes Gay Filler Copland, MD  lisinopril-hydrochlorothiazide (PRINZIDE,ZESTORETIC) 10-12.5 MG tablet Take 1 tablet by mouth daily. 05/04/16  Yes [name], MD  meloxicam (MOBIC) 15 MG tablet Take 1 tablet (15 mg total) by mouth daily. 05/04/16  Yes [name]  Brigitte Pulse, MD  Multiple Vitamin (MULTIVITAMINS PO) Take by mouth.   Yes Historical Provider, MD   Allergies  Allergen Reactions  . Penicillins Anaphylaxis   Past Surgical History:  Procedure Laterality Date  . tonsillecomy    . TONSILLECTOMY     Family History  Problem Relation Age of Onset  . Hypertension Mother   . Kidney disease Brother   .  Hypertension Maternal Grandmother   . Cancer Maternal Grandmother        bladder  . Hyperlipidemia Father   . Cancer Paternal Grandfather        prostate   Social History   Social History  . Marital status: Married    Spouse name: N/A  . Number of children: N/A  . Years of education: N/A   Occupational History  . [employer]    Social History Main Topics  . Smoking status: Former Smoker    Years: 2.00    Types: Cigarettes    Quit date: 07/11/2003  . Smokeless tobacco: Never Used  . Alcohol use No  . Drug use: No  . Sexual activity: Yes   Other Topics Concern  . None   Social History Narrative   Married with 2 children. Patient has a Financial risk analyst. Exercise: Cardio 2-3 times a week for 30 minutes.   Depression screen [facility] 2/9 08/04/2017 07/24/2016 05/04/2016 12/23/2015 06/04/2014  Decreased Interest 0 0 0 0 0  Down, Depressed, Hopeless 0 0 1 0 0  PHQ - 2 Score 0 0 1 0 0    Review of Systems  Constitutional: Negative for fatigue and unexpected weight change.  Respiratory: Negative for chest tightness.   Cardiovascular: Negative for chest pain, palpitations and leg swelling.  Gastrointestinal: Negative for abdominal pain and blood in stool.  Musculoskeletal: Positive for back pain.  Neurological: Negative for dizziness, syncope, light-headedness and headaches.  All other systems reviewed and are negative.     Objective:   Physical Exam  Constitutional: She is oriented to person, place, and time. She appears well-developed and well-nourished. No distress.  HENT:  Head: Normocephalic and atraumatic.  Right Ear: Tympanic membrane is erythematous.  Left Ear: Tympanic membrane is erythematous.  Nares with erythema,   Eyes: Pupils are equal, round, and reactive to light. EOM are normal.  Neck: Neck supple.  Cardiovascular: Normal rate, regular rhythm, S1 normal, S2 normal and normal heart sounds.   No murmur heard. Pulmonary/Chest: Effort normal and breath  sounds normal. No respiratory distress.  Abdominal: Bowel sounds are normal.  Musculoskeletal: Normal range of motion.  Neurological: She is alert and oriented to person, place, and time.  Reflex Scores:      Patellar reflexes are 2+ on the right side and 2+ on the left side. Skin: Skin is warm and dry.  Psychiatric: She has a normal mood and affect. Her behavior is normal.  Nursing note and vitals reviewed.  BP 128/78   Pulse 93   Temp 98.3 F (36.8 C) (Oral)   Resp 18   Ht 5\' 2"  (1.575 m)   Wt 253 lb 9.6 oz (115 kg)   LMP 08/03/2017   SpO2 98%   BMI 46.38 kg/m     nml D 2013, nml B12 2017 Assessment & Plan:  Ua, cbc, ferritin (low last yr), lipids (inc last yr), cmp, tsh Flu shot Add on HIV  1. Annual physical exam   2. Routine screening for STI (sexually transmitted infection)   3. Encounter for screening mammogram  for breast cancer   4. Screening for cardiovascular, respiratory, and genitourinary diseases   5. Screening for thyroid disorder   6. Essential hypertension   7. Obesity (BMI 35.0-39.9 without comorbidity)   8. Iron deficiency anemia due to chronic blood loss   9. Medication monitoring encounter   10. Pure hypercholesterolemia   11. Need for prophylactic vaccination and inoculation against influenza   12. Macromastia     Orders Placed This Encounter  Procedures  . MM SCREENING BREAST TOMO BILATERAL    Ins uhc  Annual / pf 10/12/16 bcg/ no  Needs/ no problems / tomo/fs pt      Standing Status:   Future    Standing Expiration Date:   10/04/2018    Order Specific Question:   Reason for Exam (SYMPTOM  OR DIAGNOSIS REQUIRED)    Answer:   screening for breast cancer    Order Specific Question:   Is the patient pregnant?    Answer:   No    Order Specific Question:   Preferred imaging location?    Answer:   [facility]  . Flu Vaccine QUAD 36+ mos IM  . CBC with Differential/Platelet  . Comprehensive metabolic panel    Order Specific Question:   Has  the patient fasted?    Answer:   Yes  . Lipid panel    Order Specific Question:   Has the patient fasted?    Answer:   Yes  . TSH  . Hemoglobin A1c  . Ferritin  . HIV antibody  . Ambulatory referral to Plastic Surgery    Referral Priority:   Routine    Referral Type:   Surgical    Referral Reason:   Specialty Services Required    Requested Specialty:   Plastic Surgery    Number of Visits Requested:   1  . Amb Ref to Medical Weight Management    Referral Priority:   Routine    Referral Type:   Consultation    Referred to Provider:   Starlyn Skeans, MD    Number of Visits Requested:   1  . POCT urinalysis dipstick    Meds ordered this encounter  Medications  . meloxicam (MOBIC) 15 MG tablet    Sig: Take 1 tablet (15 mg total) by mouth daily as needed for pain.    Dispense:  30 tablet    Refill:  5  . lisinopril-hydrochlorothiazide (PRINZIDE,ZESTORETIC) 10-12.5 MG tablet    Sig: Take 1 tablet by mouth daily.    Dispense:  90 tablet    Refill:  3    Please consider 90 day supplies to promote better adherence  . albuterol (PROVENTIL HFA;VENTOLIN HFA) 108 (90 Base) MCG/ACT inhaler    Sig: Inhale 2 puffs into the lungs every 4 (four) hours as needed for wheezing (cough, shortness of breath or wheezing.).    Dispense:  1 Inhaler    Refill:  3  . norethindrone-ethinyl estradiol-iron (MICROGESTIN FE,GILDESS FE,LOESTRIN FE) 1.5-30 MG-MCG tablet    Sig: Take 1 tablet by mouth daily.    Dispense:  1 Package    Refill:  3    I personally performed the services described in this documentation, which was scribed in my presence. The recorded information has been reviewed and considered, and addended by me as needed.   [name], M.D.  [facility] [address] [place], [place] [address] [phone] phone [phone] fax  08/03/17 8:21 AM

## 2017-08-04 NOTE — Patient Instructions (Addendum)
IF you received an x-ray today, you will receive an invoice from [facility]. Please contact [facility] at [phone] with questions or concerns regarding your invoice.   IF you received labwork today, you will receive an invoice from [place]. Please contact [facility] at [phone] with questions or concerns regarding your invoice.   Our billing staff will not be able to assist you with questions regarding bills from these companies.  You will be contacted with the lab results as soon as they are available. The fastest way to get your results is to activate your My Chart account. Instructions are located on the last page of this paperwork. If you have not heard from [place] regarding the results in 2 weeks, please contact this office.     Contraception Choices Contraception (birth control) is the use of any methods or devices to prevent pregnancy. Below are some methods to help avoid pregnancy. Hormonal methods  Contraceptive implant. This is a thin, plastic tube containing progesterone hormone. It does not contain estrogen hormone. Your health care provider inserts the tube in the inner part of the upper arm. The tube can remain in place for up to 3 years. After 3 years, the implant must be removed. The implant prevents the ovaries from releasing an egg (ovulation), thickens the cervical mucus to prevent sperm from entering the uterus, and thins the lining of the inside of the uterus.  Progesterone-only injections. These injections are given every 3 months by your health care provider to prevent pregnancy. This synthetic progesterone hormone stops the ovaries from releasing eggs. It also thickens cervical mucus and changes the uterine lining. This makes it harder for sperm to survive in the uterus.  Birth control pills. These pills contain estrogen and progesterone hormone. They work by preventing the ovaries from releasing eggs (ovulation). They also cause the cervical  mucus to thicken, preventing the sperm from entering the uterus. Birth control pills are prescribed by a health care provider.Birth control pills can also be used to treat heavy periods.  Minipill. This type of birth control pill contains only the progesterone hormone. They are taken every day of each month and must be prescribed by your health care provider.  Birth control patch. The patch contains hormones similar to those in birth control pills. It must be changed once a week and is prescribed by a health care provider.  Vaginal ring. The ring contains hormones similar to those in birth control pills. It is left in the vagina for 3 weeks, removed for 1 week, and then a new one is put back in place. The patient must be comfortable inserting and removing the ring from the vagina.A health care provider's prescription is necessary.  Emergency contraception. Emergency contraceptives prevent pregnancy after unprotected sexual intercourse. This pill can be taken right after sex or up to 5 days after unprotected sex. It is most effective the sooner you take the pills after having sexual intercourse. Most emergency contraceptive pills are available without a prescription. Check with your pharmacist. Do not use emergency contraception as your only form of birth control. Barrier methods  Female condom. This is a thin sheath (latex or rubber) that is worn over the penis during sexual intercourse. It can be used with spermicide to increase effectiveness.  Female condom. This is a soft, loose-fitting sheath that is put into the vagina before sexual intercourse.  Diaphragm. This is a soft, latex, dome-shaped barrier that must be fitted by a health care provider. It  is inserted into the vagina, along with a spermicidal jelly. It is inserted before intercourse. The diaphragm should be left in the vagina for 6 to 8 hours after intercourse.  Cervical cap. This is a round, soft, latex or plastic cup that fits over  the cervix and must be fitted by a health care provider. The cap can be left in place for up to 48 hours after intercourse.  Sponge. This is a soft, circular piece of polyurethane foam. The sponge has spermicide in it. It is inserted into the vagina after wetting it and before sexual intercourse.  Spermicides. These are chemicals that kill or block sperm from entering the cervix and uterus. They come in the form of creams, jellies, suppositories, foam, or tablets. They do not require a prescription. They are inserted into the vagina with an applicator before having sexual intercourse. The process must be repeated every time you have sexual intercourse. Intrauterine contraception  Intrauterine device (IUD). This is a T-shaped device that is put in a woman's uterus during a menstrual period to prevent pregnancy. There are 2 types: ? Copper IUD. This type of IUD is wrapped in copper wire and is placed inside the uterus. Copper makes the uterus and fallopian tubes produce a fluid that kills sperm. It can stay in place for 10 years. ? Hormone IUD. This type of IUD contains the hormone progestin (synthetic progesterone). The hormone thickens the cervical mucus and prevents sperm from entering the uterus, and it also thins the uterine lining to prevent implantation of a fertilized egg. The hormone can weaken or kill the sperm that get into the uterus. It can stay in place for 3-5 years, depending on which type of IUD is used. Permanent methods of contraception  Female tubal ligation. This is when the woman's fallopian tubes are surgically sealed, tied, or blocked to prevent the egg from traveling to the uterus.  Hysteroscopic sterilization. This involves placing a small coil or insert into each fallopian tube. Your doctor uses a technique called hysteroscopy to do the procedure. The device causes scar tissue to form. This results in permanent blockage of the fallopian tubes, so the sperm cannot fertilize the  egg. It takes about 3 months after the procedure for the tubes to become blocked. You must use another form of birth control for these 3 months.  Female sterilization. This is when the female has the tubes that carry sperm tied off (vasectomy).This blocks sperm from entering the vagina during sexual intercourse. After the procedure, the man can still ejaculate fluid (semen). Natural planning methods  Natural family planning. This is not having sexual intercourse or using a barrier method (condom, diaphragm, cervical cap) on days the woman could become pregnant.  Calendar method. This is keeping track of the length of each menstrual cycle and identifying when you are fertile.  Ovulation method. This is avoiding sexual intercourse during ovulation.  Symptothermal method. This is avoiding sexual intercourse during ovulation, using a thermometer and ovulation symptoms.  Post-ovulation method. This is timing sexual intercourse after you have ovulated. Regardless of which type or method of contraception you choose, it is important that you use condoms to protect against the transmission of sexually transmitted infections (STIs). Talk with your health care provider about which form of contraception is most appropriate for you. This information is not intended to replace advice given to you by your health care provider. Make sure you discuss any questions you have with your health care provider. Document Released: 10/26/2005  Document Revised: 04/02/2016 Document Reviewed: 04/20/2013 Elsevier Interactive Patient Education  2017 [employer].   Oral Contraception Information Oral contraceptive pills (OCPs) are medicines taken to prevent pregnancy. OCPs work by preventing the ovaries from releasing eggs. The hormones in OCPs also cause the cervical mucus to thicken, preventing the sperm from entering the uterus. The hormones also cause the uterine lining to become thin, not allowing a fertilized egg to  attach to the inside of the uterus. OCPs are highly effective when taken exactly as prescribed. However, OCPs do not prevent sexually transmitted diseases (STDs). Safe sex practices, such as using condoms along with the pill, can help prevent STDs. Before taking the pill, you may have a physical exam and Pap test. Your health care provider may order blood tests. The health care provider will make sure you are a good candidate for oral contraception. Discuss with your health care provider the possible side effects of the OCP you may be prescribed. When starting an OCP, it can take 2 to 3 months for the body to adjust to the changes in hormone levels in your body. Types of oral contraception  The combination pill-This pill contains estrogen and progestin (synthetic progesterone) hormones. The combination pill comes in 21-day, 28-day, or 91-day packs. Some types of combination pills are meant to be taken continuously (365-day pills). With 21-day packs, you do not take pills for 7 days after the last pill. With 28-day packs, the pill is taken every day. The last 7 pills are without hormones. Certain types of pills have more than 21 hormone-containing pills. With 91-day packs, the first 84 pills contain both hormones, and the last 7 pills contain no hormones or contain estrogen only.  The minipill-This pill contains the progesterone hormone only. The pill is taken every day continuously. It is very important to take the pill at the same time each day. The minipill comes in packs of 28 pills. All 28 pills contain the hormone. Advantages of oral contraceptive pills  Decreases premenstrual symptoms.  Treats menstrual period cramps.  Regulates the menstrual cycle.  Decreases a heavy menstrual flow.  May treatacne, depending on the type of pill.  Treats abnormal uterine bleeding.  Treats polycystic ovarian syndrome.  Treats endometriosis.  Can be used as emergency contraception. Things that can make  oral contraceptive pills less effective OCPs can be less effective if:  You forget to take the pill at the same time every day.  You have a stomach or intestinal disease that lessens the absorption of the pill.  You take OCPs with other medicines that make OCPs less effective, such as antibiotics, certain HIV medicines, and some seizure medicines.  You take expired OCPs.  You forget to restart the pill on day 7, when using the packs of 21 pills.  Risks associated with oral contraceptive pills Oral contraceptive pills can sometimes cause side effects, such as:  Headache.  Nausea.  Breast tenderness.  Irregular bleeding or spotting.  Combination pills are also associated with a small increased risk of:  Blood clots.  Heart attack.  Stroke.  This information is not intended to replace advice given to you by your health care provider. Make sure you discuss any questions you have with your health care provider. Document Released: 01/16/2003 Document Revised: 04/02/2016 Document Reviewed: 04/16/2013 Elsevier Interactive Patient Education  2018 [employer].  Oral Contraception Use Oral contraceptive pills (OCPs) are medicines taken to prevent pregnancy. OCPs work by preventing the ovaries from releasing eggs. The hormones  in OCPs also cause the cervical mucus to thicken, preventing the sperm from entering the uterus. The hormones also cause the uterine lining to become thin, not allowing a fertilized egg to attach to the inside of the uterus. OCPs are highly effective when taken exactly as prescribed. However, OCPs do not prevent sexually transmitted diseases (STDs). Safe sex practices, such as using condoms along with an OCP, can help prevent STDs. Before taking OCPs, you may have a physical exam and Pap test. Your health care provider may also order blood tests if necessary. Your health care provider will make sure you are a good candidate for oral contraception. Discuss with your  health care provider the possible side effects of the OCP you may be prescribed. When starting an OCP, it can take 2 to 3 months for the body to adjust to the changes in hormone levels in your body. How to take oral contraceptive pills Your health care provider may advise you on how to start taking the first cycle of OCPs. Otherwise, you can:  Start on day 1 of your menstrual period. You will not need any backup contraceptive protection with this start time.  Start on the first Sunday after your menstrual period or the day you get your prescription. In these cases, you will need to use backup contraceptive protection for the first week.  Start the pill at any time of your cycle. If you take the pill within 5 days of the start of your period, you are protected against pregnancy right away. In this case, you will not need a backup form of birth control. If you start at any other time of your menstrual cycle, you will need to use another form of birth control for 7 days. If your OCP is the type called a minipill, it will protect you from pregnancy after taking it for 2 days (48 hours).  After you have started taking OCPs:  If you forget to take 1 pill, take it as soon as you remember. Take the next pill at the regular time.  If you miss 2 or more pills, call your health care provider because different pills have different instructions for missed doses. Use backup birth control until your next menstrual period starts.  If you use a 28-day pack that contains inactive pills and you miss 1 of the last 7 pills (pills with no hormones), it will not matter. Throw away the rest of the non-hormone pills and start a new pill pack.  No matter which day you start the OCP, you will always start a new pack on that same day of the week. Have an extra pack of OCPs and a backup contraceptive method available in case you miss some pills or lose your OCP pack. Follow these instructions at home:  Do not  smoke.  Always use a condom to protect against STDs. OCPs do not protect against STDs.  Use a calendar to mark your menstrual period days.  Read the information and directions that came with your OCP. Talk to your health care provider if you have questions. Contact a health care provider if:  You develop nausea and vomiting.  You have abnormal vaginal discharge or bleeding.  You develop a rash.  You miss your menstrual period.  You are losing your hair.  You need treatment for mood swings or depression.  You get dizzy when taking the OCP.  You develop acne from taking the OCP.  You become pregnant. Get help right away if:  You develop chest pain.  You develop shortness of breath.  You have an uncontrolled or severe headache.  You develop numbness or slurred speech.  You develop visual problems.  You develop pain, redness, and swelling in the legs. This information is not intended to replace advice given to you by your health care provider. Make sure you discuss any questions you have with your health care provider. Document Released: 10/15/2011 Document Revised: 04/02/2016 Document Reviewed: 04/16/2013 Elsevier Interactive Patient Education  2017 [employer].

## 2017-08-05 LAB — COMPREHENSIVE METABOLIC PANEL
ALT: 9 IU/L (ref 0–32)
AST: 13 IU/L (ref 0–40)
Albumin/Globulin Ratio: 1.5 (ref 1.2–2.2)
Albumin: 4.4 g/dL (ref 3.5–5.5)
Alkaline Phosphatase: 77 IU/L (ref 39–117)
BUN/Creatinine Ratio: 11 (ref 9–23)
BUN: 9 mg/dL (ref 6–24)
Bilirubin Total: 0.2 mg/dL (ref 0.0–1.2)
CALCIUM: 9.5 mg/dL (ref 8.7–10.2)
CO2: 23 mmol/L (ref 20–29)
Chloride: 101 mmol/L (ref 96–106)
Creatinine, Ser: 0.81 mg/dL (ref 0.57–1.00)
GFR calc non Af Amer: 91 mL/min/{1.73_m2} (ref >59)
GFR, EST AFRICAN AMERICAN: 105 mL/min/{1.73_m2} (ref >59)
GLUCOSE: 90 mg/dL (ref 65–99)
Globulin, Total: 3 g/dL (ref 1.5–4.5)
Potassium: 4 mmol/L (ref 3.5–5.2)
Sodium: 140 mmol/L (ref 134–144)
TOTAL PROTEIN: 7.4 g/dL (ref 6.0–8.5)

## 2017-08-05 LAB — HEMOGLOBIN A1C
Est. average glucose Bld gHb Est-mCnc: 103 mg/dL
Hgb A1c MFr Bld: 5.2 % (ref 4.8–5.6)

## 2017-08-05 LAB — CBC WITH DIFFERENTIAL/PLATELET
BASOS ABS: 0 10*3/uL (ref 0.0–0.2)
BASOS: 0 %
EOS (ABSOLUTE): 0.3 10*3/uL (ref 0.0–0.4)
Eos: 2 %
HEMOGLOBIN: 11 g/dL — AB (ref 11.1–15.9)
Hematocrit: 34.5 % (ref 34.0–46.6)
IMMATURE GRANS (ABS): 0.1 10*3/uL (ref 0.0–0.1)
IMMATURE GRANULOCYTES: 1 %
LYMPHS: 13 %
Lymphocytes Absolute: 1.6 10*3/uL (ref 0.7–3.1)
MCH: 21.3 pg — ABNORMAL LOW (ref 26.6–33.0)
MCHC: 31.9 g/dL (ref 31.5–35.7)
MCV: 67 fL — AB (ref 79–97)
MONOCYTES: 5 %
Monocytes Absolute: 0.6 10*3/uL (ref 0.1–0.9)
NEUTROS PCT: 79 %
Neutrophils Absolute: 9.7 10*3/uL — ABNORMAL HIGH (ref 1.4–7.0)
PLATELETS: 443 10*3/uL — AB (ref 150–379)
RBC: 5.17 x10E6/uL (ref 3.77–5.28)
RDW: 16.6 % — ABNORMAL HIGH (ref 12.3–15.4)
WBC: 12.3 10*3/uL — ABNORMAL HIGH (ref 3.4–10.8)

## 2017-08-05 LAB — LIPID PANEL
CHOL/HDL RATIO: 3.8 ratio (ref 0.0–4.4)
Cholesterol, Total: 169 mg/dL (ref 100–199)
HDL: 45 mg/dL (ref >39)
LDL CALC: 107 mg/dL — AB (ref 0–99)
Triglycerides: 85 mg/dL (ref 0–149)
VLDL CHOLESTEROL CAL: 17 mg/dL (ref 5–40)

## 2017-08-05 LAB — HIV ANTIBODY (ROUTINE TESTING W REFLEX): HIV SCREEN 4TH GENERATION: NONREACTIVE

## 2017-08-05 LAB — FERRITIN: FERRITIN: 19 ng/mL (ref 15–150)

## 2017-08-05 LAB — TSH: TSH: 1.14 u[IU]/mL (ref 0.450–4.500)

## 2017-08-10 ENCOUNTER — Telehealth: Payer: Self-pay | Admitting: Family Medicine

## 2017-08-10 NOTE — Telephone Encounter (Signed)
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

## 2017-08-11 NOTE — Telephone Encounter (Signed)
Her labs have all been released from [facility] so she can print them off herself or copy the results onto the form.

## 2017-08-11 NOTE — Telephone Encounter (Signed)
Form was in my box. Need Dr Brigitte Pulse to sign. Spoke with patient and advised that I would fax it once the provider signs it. Patient verbalized understanding.

## 2017-08-13 ENCOUNTER — Encounter: Payer: Self-pay | Admitting: Family Medicine

## 2017-08-13 NOTE — Telephone Encounter (Signed)
Was signed 10/3

## 2017-10-06 ENCOUNTER — Telehealth: Payer: Self-pay | Admitting: Family Medicine

## 2017-10-06 NOTE — Telephone Encounter (Signed)
Received CRM that Santiago Glad from [facility] called requesting a referral be back dated for pt's visit with them on 09/29/17. Pt has [employer] navigate and needs referrals to see specialists. A referral was made from 08/27/16-02/25/17 but had expired and there was not an up to date referral to cover the visit from 09/29/17. I called [employer] navigate to see if they could back date. They said typically they only will up to 5 days but they were having online issues last week with the referral auth page so they were helping offices out. They said if the claim has not yet been filed, they may could help backdate. If it has been filed, there is nothing they could do. I left a message for [facility] to give me a call back to let me know if this has already been filed so I would know how to proceed.  If this has not been filed, I will call [place] back and speak with a supervisor to get this backdated.

## 2017-10-06 NOTE — Telephone Encounter (Signed)
Received authorization from [facility]. Referral ID is [id number] and is valid from 09/29/17 for 6 months or 6 visits to see Dr. Floreen Comber at [facility].

## 2017-10-18 ENCOUNTER — Ambulatory Visit: Payer: 59

## 2017-10-20 ENCOUNTER — Ambulatory Visit
Admission: RE | Admit: 2017-10-20 | Discharge: 2017-10-20 | Disposition: A | Discharge status: Home / Self Care | Payer: 59 | Source: Ambulatory Visit | Attending: Family Medicine | Admitting: Family Medicine

## 2017-10-20 DIAGNOSIS — Z1231 Encounter for screening mammogram for malignant neoplasm of breast: Secondary | ICD-10-CM

## 2017-11-06 ENCOUNTER — Ambulatory Visit (INDEPENDENT_AMBULATORY_CARE_PROVIDER_SITE_OTHER): Payer: 59 | Admitting: Emergency Medicine

## 2017-11-06 ENCOUNTER — Encounter: Payer: Self-pay | Admitting: Emergency Medicine

## 2017-11-06 ENCOUNTER — Other Ambulatory Visit: Payer: Self-pay

## 2017-11-06 VITALS — BP 118/78 | HR 110 | Temp 98.4°F | Resp 20 | Ht 62.0 in | Wt 256.1 lb

## 2017-11-06 DIAGNOSIS — R059 Cough, unspecified: Secondary | ICD-10-CM | POA: Insufficient documentation

## 2017-11-06 DIAGNOSIS — J4520 Mild intermittent asthma, uncomplicated: Secondary | ICD-10-CM | POA: Diagnosis not present

## 2017-11-06 DIAGNOSIS — R05 Cough: Secondary | ICD-10-CM

## 2017-11-06 DIAGNOSIS — J22 Unspecified acute lower respiratory infection: Secondary | ICD-10-CM

## 2017-11-06 MED ORDER — PREDNISONE 20 MG PO TABS: 40.0000 mg | ORAL_TABLET | Freq: Every day | ORAL | 0 refills | Status: AC

## 2017-11-06 MED ORDER — DM-GUAIFENESIN ER 30-600 MG PO TB12: 1.0000 | ORAL_TABLET | Freq: Two times a day (BID) | ORAL | 1 refills | Status: DC

## 2017-11-06 MED ORDER — AZITHROMYCIN 250 MG PO TABS: ORAL_TABLET | ORAL | 0 refills | Status: DC

## 2017-11-06 NOTE — Patient Instructions (Signed)
Acute Bronchitis, Adult Acute bronchitis is when air tubes (bronchi) in the lungs suddenly get swollen. The condition can make it hard to breathe. It can also cause these symptoms:  A cough.  Coughing up clear, yellow, or green mucus.  Wheezing.  Chest congestion.  Shortness of breath.  A fever.  Body aches.  Chills.  A sore throat.  Follow these instructions at home: Medicines  Take over-the-counter and prescription medicines only as told by your doctor.  If you were prescribed an antibiotic medicine, take it as told by your doctor. Do not stop taking the antibiotic even if you start to feel better. General instructions  Rest.  Drink enough fluids to keep your pee (urine) clear or pale yellow.  Avoid smoking and secondhand smoke. If you smoke and you need help quitting, ask your doctor. Quitting will help your lungs heal faster.  Use an inhaler, cool mist vaporizer, or humidifier as told by your doctor.  Keep all follow-up visits as told by your doctor. This is important. How is this prevented? To lower your risk of getting this condition again:  Wash your hands often with soap and water. If you cannot use soap and water, use hand sanitizer.  Avoid contact with people who have cold symptoms.  Try not to touch your hands to your mouth, nose, or eyes.  Make sure to get the flu shot every year.  Contact a doctor if:  Your symptoms do not get better in 2 weeks. Get help right away if:  You cough up blood.  You have chest pain.  You have very bad shortness of breath.  You become dehydrated.  You faint (pass out) or keep feeling like you are going to pass out.  You keep throwing up (vomiting).  You have a very bad headache.  Your fever or chills gets worse. This information is not intended to replace advice given to you by your health care provider. Make sure you discuss any questions you have with your health care provider. Document Released:  04/13/2008 Document Revised: 06/03/2016 Document Reviewed: 04/15/2016 Elsevier Interactive Patient Education  2018 [employer] Inc[employer] Asthma, Adult Asthma is a condition of the lungs in which the airways tighten and narrow. Asthma can make it hard to breathe. Asthma cannot be cured, but medicine and lifestyle changes can help control it. Asthma may be started (triggered) by:  Animal skin flakes (dander).  Dust.  Cockroaches.  Pollen.  Mold.  Smoke.  Cleaning products.  Hair sprays or aerosol sprays.  Paint fumes or strong smells.  Cold air, weather changes, and winds.  Crying or laughing hard.  Stress.  Certain medicines or drugs.  Foods, such as dried fruit, potato chips, and sparkling grape juice.  Infections or conditions (colds, flu).  Exercise.  Certain medical conditions or diseases.  Exercise or tiring activities.  Follow these instructions at home:  Take medicine as told by your doctor.  Use a peak flow meter as told by your doctor. A peak flow meter is a tool that measures how well the lungs are working.  Record and keep track of the peak flow meter's readings.  Understand and use the asthma action plan. An asthma action plan is a written plan for taking care of your asthma and treating your attacks.  To help prevent asthma attacks: ? Do not smoke. Stay away from secondhand smoke. ? Change your heating and air conditioning filter often. ? Limit your use of fireplaces and wood stoves. ? Get rid   of pests (such as roaches and mice) and their droppings. ? Throw away plants if you see mold on them. ? Clean your floors. Dust regularly. Use cleaning products that do not smell. ? Have someone vacuum when you are not home. Use a vacuum cleaner with a HEPA filter if possible. ? Replace carpet with wood, tile, or vinyl flooring. Carpet can trap animal skin flakes and dust. ? Use allergy-proof pillows, mattress covers, and box spring covers. ? Wash bed sheets and  blankets every week in hot water and dry them in a dryer. ? Use blankets that are made of polyester or cotton. ? Clean bathrooms and kitchens with bleach. If possible, have someone repaint the walls in these rooms with mold-resistant paint. Keep out of the rooms that are being cleaned and painted. ? Wash hands often. Contact a doctor if:  You have make a whistling sound when breaking (wheeze), have shortness of breath, or have a cough even if taking medicine to prevent attacks.  The colored mucus you cough up (sputum) is thicker than usual.  The colored mucus you cough up changes from clear or white to yellow, green, gray, or bloody.  You have problems from the medicine you are taking such as: ? A rash. ? Itching. ? Swelling. ? Trouble breathing.  You need reliever medicines more than 2-3 times a week.  Your peak flow measurement is still at 50-79% of your personal best after following the action plan for 1 hour.  You have a fever. Get help right away if:  You seem to be worse and are not responding to medicine during an asthma attack.  You are short of breath even at rest.  You get short of breath when doing very little activity.  You have trouble eating, drinking, or talking.  You have chest pain.  You have a fast heartbeat.  Your lips or fingernails start to turn blue.  You are light-headed, dizzy, or faint.  Your peak flow is less than 50% of your personal best. This information is not intended to replace advice given to you by your health care provider. Make sure you discuss any questions you have with your health care provider. Document Released: 04/13/2008 Document Revised: 04/02/2016 Document Reviewed: 05/25/2013 Elsevier Interactive Patient Education  2017 [employer] Inc.  

## 2017-11-06 NOTE — Progress Notes (Signed)
Heidi Robinson 41 y.o.   Chief Complaint  Patient presents with  . Scratchy Throat  . Nasal Congestion  . Cough    with yellow phelgm    HISTORY OF PRESENT ILLNESS: This is a 41 y.o. female complaining of cough and congestion since 25th evening.  URI   This is a new problem. The current episode started in the past 7 days. The problem has been gradually worsening. There has been no fever. Associated symptoms include congestion, coughing, rhinorrhea, a sore throat and wheezing. Pertinent negatives include no abdominal pain, chest pain, diarrhea, dysuria, ear pain, headaches, joint pain, nausea, neck pain, rash or vomiting. She has tried antihistamine, decongestant and inhaler use for the symptoms. The treatment provided mild relief.     Prior to Admission medications   Medication Sig Start Date End Date Taking? Authorizing Provider  albuterol (PROVENTIL HFA;VENTOLIN HFA) 108 (90 Base) MCG/ACT inhaler Inhale 2 puffs into the lungs every 4 (four) hours as needed for wheezing (cough, shortness of breath or wheezing.). 08/04/17  Yes [name], MD  lisinopril-hydrochlorothiazide (PRINZIDE,ZESTORETIC) 10-12.5 MG tablet Take 1 tablet by mouth daily. 08/04/17  Yes [name], MD  meloxicam (MOBIC) 15 MG tablet Take 1 tablet (15 mg total) by mouth daily as needed for pain. 08/04/17  Yes [name], MD  Multiple Vitamin (MULTIVITAMINS PO) Take by mouth.   Yes [provider]  norethindrone-ethinyl estradiol-iron (MICROGESTIN FE,GILDESS FE,LOESTRIN FE) 1.5-30 MG-MCG tablet Take 1 tablet by mouth daily. 08/04/17  Yes [name], MD    Allergies  Allergen Reactions  . Penicillins Anaphylaxis    Patient Active Problem List   Diagnosis Date Noted  . Iron deficiency anemia due to chronic blood loss 08/03/2017  . Pure hypercholesterolemia 08/03/2017  . Sickle cell trait ([place]) 06/12/2014  . Obesity (BMI 35.0-39.9 without comorbidity) 05/23/2013  . HTN (hypertension) 02/02/2012  . ABDOMINAL  BLOATING 12/25/2009  . ABDOMINAL PAIN, EPIGASTRIC 12/19/2009    Past Medical History:  Diagnosis Date  . Allergy   . Anemia   . Asthma   . Hypertension   . Iron deficiency anemia due to chronic blood loss   . Obesity (BMI 30-39.9) 02/02/2012  . Sickle cell trait ([place]) 06/04/2014   hemoglobin electrophoresis confirmed    Past Surgical History:  Procedure Laterality Date  . tonsillecomy    . TONSILLECTOMY      Social History   Socioeconomic History  . Marital status: Married    Spouse name: Not on file  . Number of children: Not on file  . Years of education: Not on file  . Highest education level: Not on file  Social Needs  . Financial resource strain: Not on file  . Food insecurity - worry: Not on file  . Food insecurity - inability: Not on file  . Transportation needs - medical: Not on file  . Transportation needs - non-medical: Not on file  Occupational History  . Occupation: [employer]  . Smoking status: Former Smoker    Years: 2.00    Types: Cigarettes    Last attempt to quit: 07/11/2003    Years since quitting: 14.3  . Smokeless tobacco: Never Used  Substance and Sexual Activity  . Alcohol use: No    Alcohol/week: 0.0 oz  . Drug use: No  . Sexual activity: Yes  Other Topics Concern  . Not on file  Social History Narrative   Married with 2 children. Patient has a  College education. Exercise: Cardio 2-3 times a week for 30 minutes.    Family History  Problem Relation Age of Onset  . Hypertension Mother   . Kidney disease Brother   . Hypertension Maternal Grandmother   . Cancer Maternal Grandmother        bladder  . Hyperlipidemia Father   . Cancer Paternal Grandfather        prostate  . Breast cancer Neg Hx      Review of Systems  Constitutional: Negative.  Negative for chills and fever.  HENT: Positive for congestion, rhinorrhea and sore throat. Negative for ear pain.   Eyes: Negative.   Respiratory: Positive for cough,  sputum production and wheezing. Negative for shortness of breath.   Cardiovascular: Negative for chest pain and palpitations.  Gastrointestinal: Negative for abdominal pain, diarrhea, nausea and vomiting.  Genitourinary: Negative for dysuria and hematuria.  Musculoskeletal: Negative for joint pain and neck pain.  Skin: Negative.  Negative for rash.  Neurological: Negative for headaches.  Endo/Heme/Allergies: Negative.   All other systems reviewed and are negative.  Vitals:   11/06/17 0939  BP: 118/78  Pulse: (!) 110  Resp: 20  Temp: 98.4 F (36.9 C)  SpO2: 98%     Physical Exam  Constitutional: She is oriented to person, place, and time. She appears well-developed and well-nourished.  HENT:  Head: Normocephalic and atraumatic.  Right Ear: External ear normal.  Left Ear: External ear normal.  Nose: Nose normal.  Mouth/Throat: Oropharynx is clear and moist.  Eyes: Conjunctivae and EOM are normal. Pupils are equal, round, and reactive to light.  Neck: Normal range of motion. Neck supple. No thyromegaly present.  Cardiovascular: Normal rate, regular rhythm and normal heart sounds.  Pulmonary/Chest: Effort normal and breath sounds normal. No respiratory distress. She has no wheezes. She has no rales.  Abdominal: Soft. Bowel sounds are normal. She exhibits no distension. There is no tenderness.  Musculoskeletal: Normal range of motion.  Lymphadenopathy:    She has no cervical adenopathy.  Neurological: She is alert and oriented to person, place, and time. No sensory deficit. She exhibits normal muscle tone. Coordination normal.  Skin: Skin is warm and dry. Capillary refill takes less than 2 seconds. No rash noted.  Psychiatric: She has a normal mood and affect. Her behavior is normal.  Vitals reviewed.    ASSESSMENT & PLAN: Heidi Robinson was seen today for scratchy throat, nasal congestion and cough.  Diagnoses and all orders for this visit:  Cough -      dextromethorphan-guaiFENesin (MUCINEX DM) 30-600 MG 12hr tablet; Take 1 tablet by mouth 2 (two) times daily.  Mild intermittent asthma without complication -     predniSONE (DELTASONE) 20 MG tablet; Take 2 tablets (40 mg total) by mouth daily with breakfast for 5 days.  Lower resp. tract infection -     azithromycin (ZITHROMAX) 250 MG tablet; Sig as indicated    Patient Instructions  Acute Bronchitis, Adult Acute bronchitis is when air tubes (bronchi) in the lungs suddenly get swollen. The condition can make it hard to breathe. It can also cause these symptoms:  A cough.  Coughing up clear, yellow, or green mucus.  Wheezing.  Chest congestion.  Shortness of breath.  A fever.  Body aches.  Chills.  A sore throat.  Follow these instructions at home: Medicines  Take over-the-counter and prescription medicines only as told by your doctor.  If you were prescribed an antibiotic medicine, take it as told by your  doctor. Do not stop taking the antibiotic even if you start to feel better. General instructions  Rest.  Drink enough fluids to keep your pee (urine) clear or pale yellow.  Avoid smoking and secondhand smoke. If you smoke and you need help quitting, ask your doctor. Quitting will help your lungs heal faster.  Use an inhaler, cool mist vaporizer, or humidifier as told by your doctor.  Keep all follow-up visits as told by your doctor. This is important. How is this prevented? To lower your risk of getting this condition again:  Wash your hands often with soap and water. If you cannot use soap and water, use hand sanitizer.  Avoid contact with people who have cold symptoms.  Try not to touch your hands to your mouth, nose, or eyes.  Make sure to get the flu shot every year.  Contact a doctor if:  Your symptoms do not get better in 2 weeks. Get help right away if:  You cough up blood.  You have chest pain.  You have very bad shortness of  breath.  You become dehydrated.  You faint (pass out) or keep feeling like you are going to pass out.  You keep throwing up (vomiting).  You have a very bad headache.  Your fever or chills gets worse. This information is not intended to replace advice given to you by your health care provider. Make sure you discuss any questions you have with your health care provider. Document Released: 04/13/2008 Document Revised: 06/03/2016 Document Reviewed: 04/15/2016 Elsevier Interactive Patient Education  2018 [employer]. Asthma, Adult Asthma is a condition of the lungs in which the airways tighten and narrow. Asthma can make it hard to breathe. Asthma cannot be cured, but medicine and lifestyle changes can help control it. Asthma may be started (triggered) by:  Animal skin flakes (dander).  Dust.  Cockroaches.  Pollen.  Mold.  Smoke.  Cleaning products.  Hair sprays or aerosol sprays.  Paint fumes or strong smells.  Cold air, weather changes, and winds.  Crying or laughing hard.  Stress.  Certain medicines or drugs.  Foods, such as dried fruit, potato chips, and sparkling grape juice.  Infections or conditions (colds, flu).  Exercise.  Certain medical conditions or diseases.  Exercise or tiring activities.  Follow these instructions at home:  Take medicine as told by your doctor.  Use a peak flow meter as told by your doctor. A peak flow meter is a tool that measures how well the lungs are working.  Record and keep track of the peak flow meter's readings.  Understand and use the asthma action plan. An asthma action plan is a written plan for taking care of your asthma and treating your attacks.  To help prevent asthma attacks: ? Do not smoke. Stay away from secondhand smoke. ? Change your heating and air conditioning filter often. ? Limit your use of fireplaces and wood stoves. ? Get rid of pests (such as roaches and mice) and their droppings. ? Throw  away plants if you see mold on them. ? Clean your floors. Dust regularly. Use cleaning products that do not smell. ? Have someone vacuum when you are not home. Use a vacuum cleaner with a HEPA filter if possible. ? Replace carpet with wood, tile, or vinyl flooring. Carpet can trap animal skin flakes and dust. ? Use allergy-proof pillows, mattress covers, and box spring covers. ? Wash bed sheets and blankets every week in hot water and dry them in  a dryer. ? Use blankets that are made of polyester or cotton. ? Clean bathrooms and kitchens with bleach. If possible, have someone repaint the walls in these rooms with mold-resistant paint. Keep out of the rooms that are being cleaned and painted. ? Wash hands often. Contact a doctor if:  You have make a whistling sound when breaking (wheeze), have shortness of breath, or have a cough even if taking medicine to prevent attacks.  The colored mucus you cough up (sputum) is thicker than usual.  The colored mucus you cough up changes from clear or white to yellow, green, gray, or bloody.  You have problems from the medicine you are taking such as: ? A rash. ? Itching. ? Swelling. ? Trouble breathing.  You need reliever medicines more than 2-3 times a week.  Your peak flow measurement is still at 50-79% of your personal best after following the action plan for 1 hour.  You have a fever. Get help right away if:  You seem to be worse and are not responding to medicine during an asthma attack.  You are short of breath even at rest.  You get short of breath when doing very little activity.  You have trouble eating, drinking, or talking.  You have chest pain.  You have a fast heartbeat.  Your lips or fingernails start to turn blue.  You are light-headed, dizzy, or faint.  Your peak flow is less than 50% of your personal best. This information is not intended to replace advice given to you by your health care provider. Make sure you  discuss any questions you have with your health care provider. Document Released: 04/13/2008 Document Revised: 04/02/2016 Document Reviewed: 05/25/2013 Elsevier Interactive Patient Education  2017 [employer]      Agustina Caroli, MD [facility]

## 2017-11-09 HISTORY — PX: REDUCTION MAMMAPLASTY: SUR839

## 2017-12-09 ENCOUNTER — Encounter (INDEPENDENT_AMBULATORY_CARE_PROVIDER_SITE_OTHER): Payer: 59

## 2017-12-21 ENCOUNTER — Ambulatory Visit (INDEPENDENT_AMBULATORY_CARE_PROVIDER_SITE_OTHER): Payer: 59 | Admitting: Family Medicine

## 2017-12-21 ENCOUNTER — Encounter (INDEPENDENT_AMBULATORY_CARE_PROVIDER_SITE_OTHER): Payer: Self-pay | Admitting: Family Medicine

## 2017-12-21 VITALS — BP 121/82 | HR 78 | Temp 97.4°F | Ht 62.0 in | Wt 258.0 lb

## 2017-12-21 DIAGNOSIS — Z6841 Body Mass Index (BMI) 40.0 and over, adult: Secondary | ICD-10-CM | POA: Diagnosis not present

## 2017-12-21 DIAGNOSIS — Z9189 Other specified personal risk factors, not elsewhere classified: Secondary | ICD-10-CM

## 2017-12-21 DIAGNOSIS — Z1331 Encounter for screening for depression: Secondary | ICD-10-CM | POA: Diagnosis not present

## 2017-12-21 DIAGNOSIS — I10 Essential (primary) hypertension: Secondary | ICD-10-CM | POA: Diagnosis not present

## 2017-12-21 DIAGNOSIS — R5383 Other fatigue: Secondary | ICD-10-CM | POA: Diagnosis not present

## 2017-12-21 DIAGNOSIS — R0602 Shortness of breath: Secondary | ICD-10-CM | POA: Diagnosis not present

## 2017-12-21 DIAGNOSIS — Z0289 Encounter for other administrative examinations: Secondary | ICD-10-CM

## 2017-12-21 NOTE — Progress Notes (Signed)
Office: [phone]  /  Fax: [phone]   Dear Dr. Brigitte Pulse,   Thank you for referring Heidi Robinson to our clinic. The following note includes my evaluation and treatment recommendations.  HPI:   Chief Complaint: OBESITY    Heidi Robinson has been referred by Heidi Robinson. Brigitte Pulse, MD for consultation regarding her obesity and obesity related comorbidities.    Heidi Robinson (MR# [id number]) is a 42 y.o. female who presents on 12/21/2017 for obesity evaluation and treatment. Current BMI is Body mass index is 47.19 kg/m.[name] Heidi Robinson has been struggling with her weight for many years and has been unsuccessful in either losing weight, maintaining weight loss, or reaching her healthy weight goal.     Heidi Robinson attended our information session and states she is currently in the action stage of change and ready to dedicate time achieving and maintaining a healthier weight. Heidi Robinson is interested in becoming our patient and working on intensive lifestyle modifications including (but not limited to) diet, exercise and weight loss.     Heidi Robinson eats out almost every meal. She has multiple food allergies, mostly related to nuts.    Heidi Robinson states her family eats meals together she thinks her family will eat healthier with  her she struggles with family and or coworkers weight loss sabotage her desired weight loss is 106 to 111 lbs she started gaining weight in 2011 after starting blood pressure medications her heaviest weight ever was 256 lbs. she has significant food cravings issues  she snacks frequently in the evenings she skips meals frequently she is frequently drinking liquids with calories she frequently makes poor food choices she has problems with excessive hunger  she frequently eats larger portions than normal  she has binge eating behaviors she struggles with emotional eating    Heidi Robinson Heidi Robinson feels her energy is lower than it should be. This has worsened with weight gain and has not worsened recently. Heidi Robinson admits to  daytime somnolence and  admits to waking up still tired. Patient is at risk for obstructive sleep apnea. Patent has a history of symptoms of daytime Heidi Robinson, morning Heidi Robinson and hypertension. Patient generally gets about 6 hours of sleep per night, and states they generally have restful sleep. Snoring is present. Apneic episodes are not present. Epworth Sleepiness Score is 7  Dyspnea on exertion Heidi Robinson notes increasing shortness of breath with exercising and seems to be worsening over time with weight gain. She notes getting out of breath sooner with activity than she used to. This has not gotten worse recently. Heidi Robinson denies orthopnea.  Hypertension Heidi Robinson is a 42 y.o. female with hypertension. Heidi Robinson denies chest pain. She would like to control her blood pressure with weight loss to help control her blood pressure with the goal of decreasing her risk of heart attack and stroke. Heidi Robinson blood pressure is well controlled on Lisinopril/HCTZ.  At risk for cardiovascular disease Heidi Robinson is at a higher than average risk for cardiovascular disease due to obesity and hypertension. She currently denies any chest pain.  Depression Screen Heidi Robinson's Food and Mood (modified PHQ-9) score was  Depression screen PHQ 2/9 12/21/2017  Decreased Interest 3  Down, Depressed, Hopeless 3  PHQ - 2 Score 6  Altered sleeping 2  Tired, decreased energy 3  Change in appetite 2  Feeling bad or failure about yourself  2  Trouble concentrating 3  Moving slowly or fidgety/restless 2  Suicidal thoughts 0  PHQ-9 Score 20  Difficult doing work/chores  Somewhat difficult    ALLERGIES: Allergies  Allergen Reactions  . Penicillins Anaphylaxis  . Food     Fish, Walnuts, Cashews, Almonds, Pecans, Blueberries, Pine nuts, Pistachios, Macadamia Nuts, Coconuts, Chestnuts    MEDICATIONS: Current Outpatient Medications on File Prior to Visit  Medication Sig Dispense Refill  . lisinopril-hydrochlorothiazide  (PRINZIDE,ZESTORETIC) 10-12.5 MG tablet Take 1 tablet by mouth daily. 90 tablet 3  . meloxicam (MOBIC) 15 MG tablet Take 1 tablet (15 mg total) by mouth daily as needed for pain. 30 tablet 5  . Multiple Vitamin (MULTIVITAMINS PO) Take by mouth.     No current facility-administered medications on file prior to visit.     PAST MEDICAL HISTORY: Past Medical History:  Diagnosis Date  . Allergy   . Anemia   . Arthritis   . Asthma   . Fibroids   . Hypertension   . Iron deficiency anemia due to chronic blood loss   . Obesity (BMI 30-39.9) 02/02/2012  . Sickle cell trait ([name]) 06/04/2014   hemoglobin electrophoresis confirmed    PAST SURGICAL HISTORY: Past Surgical History:  Procedure Laterality Date  . tonsillecomy    . TONSILLECTOMY  1997    SOCIAL HISTORY: Social History   Tobacco Use  . Smoking status: Former Smoker    Years: 2.00    Types: Cigarettes    Last attempt to quit: 07/11/2003    Years since quitting: 14.4  . Smokeless tobacco: Never Used  Substance Use Topics  . Alcohol use: No    Alcohol/week: 0.0 oz  . Drug use: No    FAMILY HISTORY: Family History  Problem Relation Age of Onset  . Hypertension Mother   . Anxiety disorder Mother   . Obesity Mother   . Kidney disease Brother   . Hypertension Maternal Grandmother   . Cancer Maternal Grandmother        bladder  . Hyperlipidemia Father   . Cancer Paternal Grandfather        prostate  . Breast cancer Neg Hx     ROS: Review of Systems  Constitutional: Positive for malaise/Heidi Robinson.  Respiratory: Positive for shortness of breath (on exertion).   Cardiovascular: Negative for chest pain and orthopnea.  Gastrointestinal: Positive for constipation.  Musculoskeletal: Positive for back pain.       Muscle or Joint Pain    PHYSICAL EXAM: Blood pressure 121/82, pulse 78, temperature (!) 97.4 F (36.3 C), temperature source Oral, height 5\' 2"  (1.575 m), weight 258 lb (117 kg), last menstrual period  12/19/2017, SpO2 99 %. Body mass index is 47.19 kg/m. Physical Exam  Constitutional: She is oriented to person, place, and time. She appears well-developed and well-nourished.  HENT:  Head: Normocephalic and atraumatic.  Nose: Nose normal.  Eyes: EOM are normal. No scleral icterus.  Neck: Normal range of motion. Neck supple. Thyromegaly (mild diffuse) present.  Cardiovascular: Normal rate and regular rhythm.  Pulmonary/Chest: Effort normal. No respiratory distress.  Abdominal: Soft. There is no tenderness.  + obesity  Musculoskeletal: Normal range of motion.  Range of Motion normal in all 4 extremities  Neurological: She is alert and oriented to person, place, and time. Coordination normal.  Skin: Skin is warm and dry.  Positive for acanthosis nigricans  Psychiatric: She has a normal mood and affect. Her behavior is normal.  Vitals reviewed.   RECENT LABS AND TESTS: BMET    Component Value Date/Time   NA 140 08/04/2017 1006   K 4.0 08/04/2017 1006   CL  101 08/04/2017 1006   CO2 23 08/04/2017 1006   GLUCOSE 90 08/04/2017 1006   GLUCOSE 82 07/24/2016 1238   BUN 9 08/04/2017 1006   CREATININE 0.81 08/04/2017 1006   CREATININE 0.80 07/24/2016 1238   CALCIUM 9.5 08/04/2017 1006   GFRNONAA 91 08/04/2017 1006   GFRAA 105 08/04/2017 1006   Lab Results  Component Value Date   HGBA1C 5.2 08/04/2017   No results found for: INSULIN CBC    Component Value Date/Time   WBC 12.3 (H) 08/04/2017 1006   WBC 11.5 (H) 07/24/2016 1238   RBC 5.17 08/04/2017 1006   RBC 5.42 (H) 07/24/2016 1238   HGB 11.0 (L) 08/04/2017 1006   HCT 34.5 08/04/2017 1006   PLT 443 (H) 08/04/2017 1006   MCV 67 (L) 08/04/2017 1006   MCH 21.3 (L) 08/04/2017 1006   MCH 23.1 (L) 07/24/2016 1238   MCHC 31.9 08/04/2017 1006   MCHC 33.6 07/24/2016 1238   RDW 16.6 (H) 08/04/2017 1006   LYMPHSABS 1.6 08/04/2017 1006   MONOABS 0.5 06/04/2014 0828   EOSABS 0.3 08/04/2017 1006   BASOSABS 0.0 08/04/2017 1006    Iron/TIBC/Ferritin/ %Sat    Component Value Date/Time   FERRITIN 19 08/04/2017 1006   Lipid Panel     Component Value Date/Time   CHOL 169 08/04/2017 1006   TRIG 85 08/04/2017 1006   HDL 45 08/04/2017 1006   CHOLHDL 3.8 08/04/2017 1006   CHOLHDL 3.6 07/24/2016 1238   VLDL 27 07/24/2016 1238   LDLCALC 107 (H) 08/04/2017 1006   Hepatic Function Panel     Component Value Date/Time   PROT 7.4 08/04/2017 1006   ALBUMIN 4.4 08/04/2017 1006   AST 13 08/04/2017 1006   ALT 9 08/04/2017 1006   ALKPHOS 77 08/04/2017 1006   BILITOT 0.2 08/04/2017 1006      Component Value Date/Time   TSH 1.140 08/04/2017 1006   TSH 0.96 07/24/2016 1238   TSH 1.62 05/04/2016 1824    ECG  shows NSR with a rate of 81 BPM INDIRECT CALORIMETER done today shows a VO2 of 232 and a REE of 1622.  Her calculated basal metabolic rate is 7989 thus her basal metabolic rate is worse than expected.    ASSESSMENT AND PLAN: Other Heidi Robinson - Plan: EKG 12-Lead, Vitamin B12, CBC With Differential, Comprehensive metabolic panel, Folate, Hemoglobin A1c, Insulin, random, Lipid Panel With LDL/HDL Ratio, T3, T4, free, TSH, VITAMIN D 25 Hydroxy (Vit-D Deficiency, Fractures)  Shortness of breath on exertion - Plan: CBC With Differential  Essential hypertension - Plan: Comprehensive metabolic panel  Depression screening  At risk for heart disease  Class 3 severe obesity with serious comorbidity and body mass index (BMI) of 45.0 to 49.9 in adult, unspecified obesity type (HCC)  PLAN: Heidi Robinson Heidi Robinson was informed that her Heidi Robinson may be related to obesity, depression or many other causes. Labs will be ordered, and in the meanwhile Heidi Robinson has agreed to work on diet, exercise and weight loss to help with Heidi Robinson. Proper sleep hygiene was discussed including the need for 7-8 hours of quality sleep each night. A sleep study was not ordered based on symptoms and Epworth score.  Dyspnea on exertion Heidi Robinson's shortness of breath  appears to be obesity related and exercise induced. She has agreed to work on weight loss and gradually increase exercise to treat her exercise induced shortness of breath. If Jaskirat follows our instructions and loses weight without improvement of her shortness of breath, we will  plan to refer to pulmonology. We will monitor this condition regularly. Arika agrees to this plan.  Hypertension We discussed sodium restriction, working on healthy weight loss, and a regular exercise program as the means to achieve improved blood pressure control. Lannette agreed with this plan and agreed to follow up as directed. We will check labs and continue to monitor her blood pressure as well as her progress with the above lifestyle modifications. She will continue her medications as prescribed and will watch for signs of hypotension as she continues her lifestyle modifications.  Cardiovascular risk counseling Kimyata was given extended (15 minutes) coronary artery disease prevention counseling today. She is 42 y.o. female and has risk factors for heart disease including obesity and hypertension. We discussed intensive lifestyle modifications today with an emphasis on specific weight loss instructions and strategies. Pt was also informed of the importance of increasing exercise and decreasing saturated fats to help prevent heart disease.  Depression Screen Heidi Robinson had a strongly positive depression screening. Depression is commonly associated with obesity and often results in emotional eating behaviors. We will monitor this closely and work on CBT to help improve the non-hunger eating patterns. Referral to Psychology may be required if no improvement is seen as she continues in our clinic.  Obesity Beza is currently in the action stage of change and her goal is to continue with weight loss efforts. I recommend Karleigh begin the structured treatment plan as follows:  She has agreed to follow the Category 2 plan +100 calories Karmen has  been instructed to eventually work up to a goal of 150 minutes of combined cardio and strengthening exercise per week for weight loss and overall health benefits. We discussed the following Behavioral Modification Strategies today: increasing lean protein intake, decrease eating out, work on meal planning and easy cooking plans and dealing with family or coworker sabotage   She was informed of the importance of frequent follow up visits to maximize her success with intensive lifestyle modifications for her multiple health conditions. She was informed we would discuss her lab results at her next visit unless there is a critical issue that needs to be addressed sooner. Sharyah agreed to keep her next visit at the agreed upon time to discuss these results.    OBESITY BEHAVIORAL INTERVENTION VISIT  Today's visit was # 1 out of 22.  Starting weight: 258 lbs Starting date: 12/21/17 Today's weight : 258 lbs Today's date: 12/21/2017 Total lbs lost to date: 0 (Patients must lose 7 lbs in the first 6 months to continue with counseling)   ASK: We discussed the diagnosis of obesity with Cristie Hem today and Armiyah agreed to give [place] permission to discuss obesity behavioral modification therapy today.  ASSESS: Valicia has the diagnosis of obesity and her BMI today is 47.18 Tynesha is in the action stage of change   ADVISE: Katriana was educated on the multiple health risks of obesity as well as the benefit of weight loss to improve her health. She was advised of the need for long term treatment and the importance of lifestyle modifications.  AGREE: Multiple dietary modification options and treatment options were discussed and  Baylyn agreed to the above obesity treatment plan.   I, Heidi Robinson, am acting as transcriptionist for  Dennard Nip, MD   I have reviewed the above documentation for accuracy and completeness, and I agree with the above. -Dennard Nip, MD

## 2017-12-22 LAB — COMPREHENSIVE METABOLIC PANEL
A/G RATIO: 1.6 (ref 1.2–2.2)
ALBUMIN: 4.1 g/dL (ref 3.5–5.5)
ALK PHOS: 68 IU/L (ref 39–117)
ALT: 13 IU/L (ref 0–32)
AST: 16 IU/L (ref 0–40)
BILIRUBIN TOTAL: 0.2 mg/dL (ref 0.0–1.2)
BUN/Creatinine Ratio: 14 (ref 9–23)
BUN: 10 mg/dL (ref 6–24)
CHLORIDE: 100 mmol/L (ref 96–106)
CO2: 22 mmol/L (ref 20–29)
Calcium: 9.1 mg/dL (ref 8.7–10.2)
Creatinine, Ser: 0.73 mg/dL (ref 0.57–1.00)
GFR calc Af Amer: 118 mL/min/{1.73_m2} (ref >59)
GFR calc non Af Amer: 103 mL/min/{1.73_m2} (ref >59)
GLUCOSE: 86 mg/dL (ref 65–99)
Globulin, Total: 2.5 g/dL (ref 1.5–4.5)
POTASSIUM: 4 mmol/L (ref 3.5–5.2)
SODIUM: 138 mmol/L (ref 134–144)
Total Protein: 6.6 g/dL (ref 6.0–8.5)

## 2017-12-22 LAB — LIPID PANEL WITH LDL/HDL RATIO
Cholesterol, Total: 161 mg/dL (ref 100–199)
HDL: 43 mg/dL (ref >39)
LDL CALC: 100 mg/dL — AB (ref 0–99)
LDL/HDL RATIO: 2.3 ratio (ref 0.0–3.2)
Triglycerides: 90 mg/dL (ref 0–149)
VLDL CHOLESTEROL CAL: 18 mg/dL (ref 5–40)

## 2017-12-22 LAB — INSULIN, RANDOM: INSULIN: 23.4 u[IU]/mL (ref 2.6–24.9)

## 2017-12-22 LAB — CBC WITH DIFFERENTIAL
BASOS ABS: 0 10*3/uL (ref 0.0–0.2)
Basos: 0 %
EOS (ABSOLUTE): 0.2 10*3/uL (ref 0.0–0.4)
Eos: 2 %
Hematocrit: 33.5 % — ABNORMAL LOW (ref 34.0–46.6)
Hemoglobin: 10.8 g/dL — ABNORMAL LOW (ref 11.1–15.9)
Immature Grans (Abs): 0.1 10*3/uL (ref 0.0–0.1)
Immature Granulocytes: 1 %
LYMPHS ABS: 2.2 10*3/uL (ref 0.7–3.1)
Lymphs: 22 %
MCH: 21.6 pg — AB (ref 26.6–33.0)
MCHC: 32.2 g/dL (ref 31.5–35.7)
MCV: 67 fL — AB (ref 79–97)
Monocytes Absolute: 0.5 10*3/uL (ref 0.1–0.9)
Monocytes: 5 %
NEUTROS ABS: 7.1 10*3/uL — AB (ref 1.4–7.0)
Neutrophils: 70 %
RBC: 5 x10E6/uL (ref 3.77–5.28)
RDW: 15.7 % — ABNORMAL HIGH (ref 12.3–15.4)
WBC: 10.1 10*3/uL (ref 3.4–10.8)

## 2017-12-22 LAB — VITAMIN B12: Vitamin B-12: 585 pg/mL (ref 232–1245)

## 2017-12-22 LAB — HEMOGLOBIN A1C
Est. average glucose Bld gHb Est-mCnc: 117 mg/dL
Hgb A1c MFr Bld: 5.7 % — ABNORMAL HIGH (ref 4.8–5.6)

## 2017-12-22 LAB — VITAMIN D 25 HYDROXY (VIT D DEFICIENCY, FRACTURES): Vit D, 25-Hydroxy: 45.5 ng/mL (ref 30.0–100.0)

## 2017-12-22 LAB — TSH: TSH: 1.27 u[IU]/mL (ref 0.450–4.500)

## 2017-12-22 LAB — T3: T3 TOTAL: 149 ng/dL (ref 71–180)

## 2017-12-22 LAB — FOLATE: FOLATE: 19.1 ng/mL (ref >3.0)

## 2017-12-22 LAB — T4, FREE: FREE T4: 1.13 ng/dL (ref 0.82–1.77)

## 2018-01-04 ENCOUNTER — Ambulatory Visit (INDEPENDENT_AMBULATORY_CARE_PROVIDER_SITE_OTHER): Payer: 59 | Admitting: Family Medicine

## 2018-01-04 VITALS — BP 122/85 | HR 115 | Temp 97.5°F | Ht 62.0 in | Wt 254.0 lb

## 2018-01-04 DIAGNOSIS — Z6841 Body Mass Index (BMI) 40.0 and over, adult: Secondary | ICD-10-CM | POA: Diagnosis not present

## 2018-01-04 DIAGNOSIS — Z9189 Other specified personal risk factors, not elsewhere classified: Secondary | ICD-10-CM | POA: Diagnosis not present

## 2018-01-04 DIAGNOSIS — D649 Anemia, unspecified: Secondary | ICD-10-CM | POA: Insufficient documentation

## 2018-01-04 DIAGNOSIS — D6489 Other specified anemias: Secondary | ICD-10-CM

## 2018-01-04 DIAGNOSIS — R7303 Prediabetes: Secondary | ICD-10-CM | POA: Insufficient documentation

## 2018-01-04 DIAGNOSIS — E559 Vitamin D deficiency, unspecified: Secondary | ICD-10-CM

## 2018-01-04 MED ORDER — DOCUSATE SODIUM 100 MG PO CAPS: 100.0000 mg | ORAL_CAPSULE | Freq: Every day | ORAL | 0 refills | Status: DC

## 2018-01-04 MED ORDER — METFORMIN HCL 500 MG PO TABS: 500.0000 mg | ORAL_TABLET | Freq: Every day | ORAL | 0 refills | Status: DC

## 2018-01-04 MED ORDER — FERROUS SULFATE 325 (65 FE) MG PO TABS: 325.0000 mg | ORAL_TABLET | Freq: Every day | ORAL | 0 refills | Status: DC

## 2018-01-04 MED ORDER — VITAMIN D (ERGOCALCIFEROL) 1.25 MG (50000 UNIT) PO CAPS: 50000.0000 [IU] | ORAL_CAPSULE | ORAL | 0 refills | Status: DC

## 2018-01-05 NOTE — Progress Notes (Signed)
Office: [phone]  /  Fax: [phone]   HPI:   Chief Complaint: OBESITY Heidi Robinson is here to discuss her progress with her obesity treatment plan. She is on the Category 2 plan +100 calories and is following her eating plan approximately 95 % of the time. She states she is exercising 0 minutes 0 times per week. Heidi Robinson did well with weight loss, but she had significant between meals hunger. Heidi Robinson did well with meal planning and she  tried to decrease eating out and not consume liquid calories. Her weight is 254 lb (115.2 kg) today and has had a weight loss of 4 pounds over a period of 2 weeks since her last visit. She has lost 4 lbs since starting treatment with [place].  Vitamin D deficiency Heidi Robinson has a new diagnosis of vitamin D deficiency. She is currently taking a multi vitamin and is not yet at goal. She admits fatigue and denies nausea, vomiting or muscle weakness.   Ref. Range 12/21/2017 09:59  Vitamin D, 25-Hydroxy Latest Ref Range: 30.0 - 100.0 ng/mL 45.5   Pre-Diabetes Heidi Robinson has a new diagnosis of pre-diabetes based on her elevated Hgb A1c and was informed this puts her at greater risk of developing diabetes. Heidi Robinson admits severe polyphagia most of the day, even one hour after a full meal. She is not taking metformin currently and continues to work on diet and exercise to decrease risk of diabetes. She denies nausea or hypoglycemia.  At risk for diabetes Heidi Robinson is at higher than average risk for developing diabetes due to her obesity and pre-diabetes. She currently denies polyuria or polydipsia.  Anemia Heidi Robinson has a diagnosis of anemia. She has a history of menorrhagia and sickle cell trait. She has been prescribed iron, but she didn't take it due to constipation.   ALLERGIES: Allergies  Allergen Reactions  . Penicillins Anaphylaxis  . Food     Fish, Walnuts, Cashews, Almonds, Pecans, Blueberries, Pine nuts, Pistachios, Macadamia Nuts, Coconuts, Chestnuts    MEDICATIONS: Current  Outpatient Medications on File Prior to Visit  Medication Sig Dispense Refill  . lisinopril-hydrochlorothiazide (PRINZIDE,ZESTORETIC) 10-12.5 MG tablet Take 1 tablet by mouth daily. 90 tablet 3  . meloxicam (MOBIC) 15 MG tablet Take 1 tablet (15 mg total) by mouth daily as needed for pain. 30 tablet 5  . Multiple Vitamin (MULTIVITAMINS PO) Take by mouth.     No current facility-administered medications on file prior to visit.     PAST MEDICAL HISTORY: Past Medical History:  Diagnosis Date  . Allergy   . Anemia   . Arthritis   . Asthma   . Fibroids   . Hypertension   . Iron deficiency anemia due to chronic blood loss   . Obesity (BMI 30-39.9) 02/02/2012  . Sickle cell trait (Heidi Robinson) 06/04/2014   hemoglobin electrophoresis confirmed    PAST SURGICAL HISTORY: Past Surgical History:  Procedure Laterality Date  . tonsillecomy    . TONSILLECTOMY  1997    SOCIAL HISTORY: Social History   Tobacco Use  . Smoking status: Former Smoker    Years: 2.00    Types: Cigarettes    Last attempt to quit: 07/11/2003    Years since quitting: 14.4  . Smokeless tobacco: Never Used  Substance Use Topics  . Alcohol use: No    Alcohol/week: 0.0 oz  . Drug use: No    FAMILY HISTORY: Family History  Problem Relation Age of Onset  . Hypertension Mother   . Anxiety disorder Mother   .  Obesity Mother   . Kidney disease Brother   . Hypertension Maternal Grandmother   . Cancer Maternal Grandmother        bladder  . Hyperlipidemia Father   . Cancer Paternal Grandfather        prostate  . Breast cancer Neg Hx     ROS: Review of Systems  Constitutional: Positive for malaise/fatigue and weight loss.  Gastrointestinal: Negative for nausea and vomiting.  Genitourinary: Negative for frequency.  Musculoskeletal:       Negative for muscle weakness  Endo/Heme/Allergies: Negative for polydipsia.       Positive for polyphagia Negative for hypoglycemia    PHYSICAL EXAM: Blood pressure 122/85,  pulse (!) 115, temperature (!) 97.5 F (36.4 C), temperature source Oral, height 5\' 2"  (1.575 m), weight 254 lb (115.2 kg), last menstrual period 12/19/2017, SpO2 98 %. Body mass index is 46.46 kg/m. Physical Exam  Constitutional: She is oriented to person, place, and time. She appears well-developed and well-nourished.  Cardiovascular: Normal rate.  Pulmonary/Chest: Effort normal.  Musculoskeletal: Normal range of motion.  Neurological: She is oriented to person, place, and time.  Skin: Skin is warm and dry.  Psychiatric: She has a normal mood and affect. Her behavior is normal.  Vitals reviewed.   RECENT LABS AND TESTS: BMET    Component Value Date/Time   NA 138 12/21/2017 0959   K 4.0 12/21/2017 0959   CL 100 12/21/2017 0959   CO2 22 12/21/2017 0959   GLUCOSE 86 12/21/2017 0959   GLUCOSE 82 07/24/2016 1238   BUN 10 12/21/2017 0959   CREATININE 0.73 12/21/2017 0959   CREATININE 0.80 07/24/2016 1238   CALCIUM 9.1 12/21/2017 0959   GFRNONAA 103 12/21/2017 0959   GFRAA 118 12/21/2017 0959   Lab Results  Component Value Date   HGBA1C 5.7 (H) 12/21/2017   HGBA1C 5.2 08/04/2017   HGBA1C 5.3 07/24/2016   HGBA1C 5.6 07/31/2015   Lab Results  Component Value Date   INSULIN 23.4 12/21/2017   CBC    Component Value Date/Time   WBC 10.1 12/21/2017 0959   WBC 11.5 (H) 07/24/2016 1238   RBC 5.00 12/21/2017 0959   RBC 5.42 (H) 07/24/2016 1238   HGB 10.8 (L) 12/21/2017 0959   HCT 33.5 (L) 12/21/2017 0959   PLT 443 (H) 08/04/2017 1006   MCV 67 (L) 12/21/2017 0959   MCH 21.6 (L) 12/21/2017 0959   MCH 23.1 (L) 07/24/2016 1238   MCHC 32.2 12/21/2017 0959   MCHC 33.6 07/24/2016 1238   RDW 15.7 (H) 12/21/2017 0959   LYMPHSABS 2.2 12/21/2017 0959   MONOABS 0.5 06/04/2014 0828   EOSABS 0.2 12/21/2017 0959   BASOSABS 0.0 12/21/2017 0959   Iron/TIBC/Ferritin/ %Sat    Component Value Date/Time   FERRITIN 19 08/04/2017 1006   Lipid Panel     Component Value Date/Time    CHOL 161 12/21/2017 0959   TRIG 90 12/21/2017 0959   HDL 43 12/21/2017 0959   CHOLHDL 3.8 08/04/2017 1006   CHOLHDL 3.6 07/24/2016 1238   VLDL 27 07/24/2016 1238   LDLCALC 100 (H) 12/21/2017 0959   Hepatic Function Panel     Component Value Date/Time   PROT 6.6 12/21/2017 0959   ALBUMIN 4.1 12/21/2017 0959   AST 16 12/21/2017 0959   ALT 13 12/21/2017 0959   ALKPHOS 68 12/21/2017 0959   BILITOT 0.2 12/21/2017 0959      Component Value Date/Time   TSH 1.270 12/21/2017 0959   TSH  1.140 08/04/2017 1006   TSH 0.96 07/24/2016 1238     Ref. Range 12/21/2017 09:59  Vitamin D, 25-Hydroxy Latest Ref Range: 30.0 - 100.0 ng/mL 45.5   ASSESSMENT AND PLAN: Vitamin D deficiency - Plan: Vitamin D, Ergocalciferol, (DRISDOL) 50000 units CAPS capsule  Anemia due to other cause, not classified - Plan: ferrous sulfate 325 (65 FE) MG tablet, docusate sodium (COLACE) 100 MG capsule  Prediabetes - Plan: metFORMIN (GLUCOPHAGE) 500 MG tablet  At risk for diabetes mellitus  Class 3 severe obesity with serious comorbidity and body mass index (BMI) of 45.0 to 49.9 in adult, unspecified obesity type ([place])  PLAN:  Vitamin D Deficiency Nayda was informed that low vitamin D levels contributes to fatigue and are associated with obesity, breast, and colon cancer. She agrees to continue multi vitamin and start to take prescription Vit D @50 ,000 IU every week #4 with no refills. We will recheck labs in 3 months and she will follow up for routine testing of vitamin D, at least 2-3 times per year. She was informed of the risk of over-replacement of vitamin D and agrees to not increase her dose unless she discusses this with [place] first. Ketra agrees to follow up with our clinic in 2 weeks.  Pre-Diabetes Esly will continue to work on weight loss, exercise, and decreasing simple carbohydrates in her diet to help decrease the risk of diabetes. We dicussed metformin including benefits and risks. She was informed that  eating too many simple carbohydrates or too many calories at one sitting increases the likelihood of GI side effects. Lacosta agrees to start metformin 500 mg qam #30 with no refills. We will recheck labs in 3 months and Ashawna agreed to follow up with [place] as directed to monitor her progress.  Diabetes risk counseling Junie was given extended (30 minutes) diabetes prevention counseling today. She is 42 y.o. female and has risk factors for diabetes including obesity and pre-diabetes. We discussed intensive lifestyle modifications today with an emphasis on weight loss as well as increasing exercise and decreasing simple carbohydrates in her diet.  Anemia The diagnosis of anemia was discussed with Nary and was explained in detail. She was given suggestions of iron rich foods and iron supplement was prescribed. Viviana agrees to start FeSO4 325 mg qd #30 with no refills and Colace 100 mg qd #30 with no refills. Labs will be rechecked in 3 months.Vicci agrees to follow up with our clinic in 2 weeks.  Obesity Laruen is currently in the action stage of change. As such, her goal is to continue with weight loss efforts She has agreed to follow the Category 2 plan +100 calories Elain has been instructed to work up to a goal of 150 minutes of combined cardio and strengthening exercise per week for weight loss and overall health benefits. We discussed the following Behavioral Modification Strategies today: increasing lean protein intake, decreasing simple carbohydrates  and work on meal planning and easy cooking plans  Matalyn has agreed to follow up with our clinic in 2 weeks. She was informed of the importance of frequent follow up visits to maximize her success with intensive lifestyle modifications for her multiple health conditions.   OBESITY BEHAVIORAL INTERVENTION VISIT  Today's visit was # 2 out of 22.  Starting weight: 258 lbs Starting date: 12/21/17 Today's weight : 254 lbs Today's date: 01/04/2018 Total lbs  lost to date: 4 (Patients must lose 7 lbs in the first 6 months to continue with counseling)  ASK: We discussed the diagnosis of obesity with Cristie Hem today and Wanell agreed to give [place] permission to discuss obesity behavioral modification therapy today.  ASSESS: Cartier has the diagnosis of obesity and her BMI today is 46.45 Yesly is in the action stage of change   ADVISE: Jamar was educated on the multiple health risks of obesity as well as the benefit of weight loss to improve her health. She was advised of the need for long term treatment and the importance of lifestyle modifications.  AGREE: Multiple dietary modification options and treatment options were discussed and  Katherinne agreed to the above obesity treatment plan.  I, Doreene Nest, am acting as transcriptionist for Dennard Nip, MD  I have reviewed the above documentation for accuracy and completeness, and I agree with the above. -Dennard Nip, MD

## 2018-01-18 ENCOUNTER — Ambulatory Visit (INDEPENDENT_AMBULATORY_CARE_PROVIDER_SITE_OTHER): Payer: 59 | Admitting: Family Medicine

## 2018-01-18 VITALS — BP 110/74 | HR 89 | Temp 98.2°F | Ht 62.0 in | Wt 246.0 lb

## 2018-01-18 DIAGNOSIS — R7303 Prediabetes: Secondary | ICD-10-CM | POA: Diagnosis not present

## 2018-01-18 DIAGNOSIS — K5909 Other constipation: Secondary | ICD-10-CM | POA: Diagnosis not present

## 2018-01-18 DIAGNOSIS — Z6841 Body Mass Index (BMI) 40.0 and over, adult: Secondary | ICD-10-CM

## 2018-01-18 DIAGNOSIS — Z9189 Other specified personal risk factors, not elsewhere classified: Secondary | ICD-10-CM | POA: Diagnosis not present

## 2018-01-18 DIAGNOSIS — E559 Vitamin D deficiency, unspecified: Secondary | ICD-10-CM | POA: Diagnosis not present

## 2018-01-18 DIAGNOSIS — D508 Other iron deficiency anemias: Secondary | ICD-10-CM

## 2018-01-18 MED ORDER — FERROUS SULFATE 325 (65 FE) MG PO TABS: 325.0000 mg | ORAL_TABLET | Freq: Every day | ORAL | 0 refills | Status: DC

## 2018-01-18 MED ORDER — VITAMIN D (ERGOCALCIFEROL) 1.25 MG (50000 UNIT) PO CAPS
50000.0000 [IU] | ORAL_CAPSULE | ORAL | 0 refills | Status: DC
Start: 2018-01-18 — End: 2018-03-16

## 2018-01-18 MED ORDER — POLYETHYLENE GLYCOL 3350 17 GM/SCOOP PO POWD: 17.0000 g | Freq: Every day | ORAL | 0 refills | Status: DC

## 2018-01-18 MED ORDER — METFORMIN HCL 500 MG PO TABS: 500.0000 mg | ORAL_TABLET | Freq: Every day | ORAL | 0 refills | Status: DC

## 2018-01-18 NOTE — Progress Notes (Signed)
Office: [phone]  /  Fax: [phone]   HPI:   Chief Complaint: OBESITY Heidi Robinson is here to discuss her progress with her obesity treatment plan. She is on the Category 2 plan + 100 calories and is following her eating plan approximately 95 % of the time. She states she is exercising 0 minutes 0 times per week. Heidi Robinson continues to do well with weight loss. Her hunger has been mostly controlled but she has had some increased evening cravings. Her 42 year old son is making eating healthier more difficult.  Her weight is 246 lb (111.6 kg) today and has had a weight loss of 8 pounds over a period of 2 weeks since her last visit. She has lost 12 lbs since starting treatment with [place].  Pre-Diabetes Heidi Robinson has a diagnosis of pre-diabetes based on her elevated Hgb A1c and was informed this puts her at greater risk of developing diabetes. She is stable on metformin, no GI upset, doing well on diet prescription and continues to work on diet and exercise to decrease risk of diabetes. She denies nausea or hypoglycemia.  At risk for diabetes Heidi Robinson is at higher than average risk for developing diabetes due to her obesity and pre-diabetes. She currently denies polyuria or polydipsia.  Vitamin D Deficiency Heidi Robinson has a diagnosis of vitamin D deficiency. She is on Vit D prescription, not yet at goal. She denies nausea, vomiting or muscle weakness and notes no change in fatigue.  Iron Deficiency Anemia Heidi Robinson has a diagnosis of anemia. She started iron and colace but still struggling with constipation and notes no change in fatigue yet.  Constipation Heidi Robinson notes constipation for the last few weeks, worse since starting iron approximately 3 weeks ago. She notes no improvement with colace, states BM are less frequent and she is straining more. She denies hematochezia or melena. She denies drinking less H20 recently.  ALLERGIES: Allergies  Allergen Reactions  . Penicillins Anaphylaxis  . Food     Fish, Walnuts,  Cashews, Almonds, Pecans, Blueberries, Pine nuts, Pistachios, Macadamia Nuts, Coconuts, Chestnuts    MEDICATIONS: Current Outpatient Medications on File Prior to Visit  Medication Sig Dispense Refill  . ferrous sulfate 325 (65 FE) MG tablet Take 1 tablet (325 mg total) by mouth daily with breakfast. 30 tablet 0  . lisinopril-hydrochlorothiazide (PRINZIDE,ZESTORETIC) 10-12.5 MG tablet Take 1 tablet by mouth daily. 90 tablet 3  . meloxicam (MOBIC) 15 MG tablet Take 1 tablet (15 mg total) by mouth daily as needed for pain. 30 tablet 5  . metFORMIN (GLUCOPHAGE) 500 MG tablet Take 1 tablet (500 mg total) by mouth daily with breakfast. 30 tablet 0  . Multiple Vitamin (MULTIVITAMINS PO) Take by mouth.    . Vitamin D, Ergocalciferol, (DRISDOL) 50000 units CAPS capsule Take 1 capsule (50,000 Units total) by mouth every 7 (seven) days. 4 capsule 0   No current facility-administered medications on file prior to visit.     PAST MEDICAL HISTORY: Past Medical History:  Diagnosis Date  . Allergy   . Anemia   . Arthritis   . Asthma   . Fibroids   . Hypertension   . Iron deficiency anemia due to chronic blood loss   . Obesity (BMI 30-39.9) 02/02/2012  . Sickle cell trait ([place]) 06/04/2014   hemoglobin electrophoresis confirmed    PAST SURGICAL HISTORY: Past Surgical History:  Procedure Laterality Date  . tonsillecomy    . TONSILLECTOMY  1997    SOCIAL HISTORY: Social History   Tobacco  Use  . Smoking status: Former Smoker    Years: 2.00    Types: Cigarettes    Last attempt to quit: 07/11/2003    Years since quitting: 14.5  . Smokeless tobacco: Never Used  Substance Use Topics  . Alcohol use: No    Alcohol/week: 0.0 oz  . Drug use: No    FAMILY HISTORY: Family History  Problem Relation Age of Onset  . Hypertension Mother   . Anxiety disorder Mother   . Obesity Mother   . Kidney disease Brother   . Hypertension Maternal Grandmother   . Cancer Maternal Grandmother        bladder   . Hyperlipidemia Father   . Cancer Paternal Grandfather        prostate  . Breast cancer Neg Hx     ROS: Review of Systems  Constitutional: Positive for malaise/fatigue and weight loss.  Gastrointestinal: Positive for constipation. Negative for melena, nausea and vomiting.       Negative hematochezia  Genitourinary: Negative for frequency.  Musculoskeletal:       Negative muscle weakness  Endo/Heme/Allergies: Negative for polydipsia.       Negative hypoglycemia    PHYSICAL EXAM: Blood pressure 110/74, pulse 89, temperature 98.2 F (36.8 C), temperature source Oral, height 5\' 2"  (1.575 m), weight 246 lb (111.6 kg), last menstrual period 01/15/2018, SpO2 100 %. Body mass index is 44.99 kg/m. Physical Exam  Constitutional: She is oriented to person, place, and time. She appears well-developed and well-nourished.  Cardiovascular: Normal rate.  Pulmonary/Chest: Effort normal.  Musculoskeletal: Normal range of motion.  Neurological: She is oriented to person, place, and time.  Skin: Skin is warm and dry.  Psychiatric: She has a normal mood and affect. Her behavior is normal.  Vitals reviewed.   RECENT LABS AND TESTS: BMET    Component Value Date/Time   NA 138 12/21/2017 0959   K 4.0 12/21/2017 0959   CL 100 12/21/2017 0959   CO2 22 12/21/2017 0959   GLUCOSE 86 12/21/2017 0959   GLUCOSE 82 07/24/2016 1238   BUN 10 12/21/2017 0959   CREATININE 0.73 12/21/2017 0959   CREATININE 0.80 07/24/2016 1238   CALCIUM 9.1 12/21/2017 0959   GFRNONAA 103 12/21/2017 0959   GFRAA 118 12/21/2017 0959   Lab Results  Component Value Date   HGBA1C 5.7 (H) 12/21/2017   HGBA1C 5.2 08/04/2017   HGBA1C 5.3 07/24/2016   HGBA1C 5.6 07/31/2015   Lab Results  Component Value Date   INSULIN 23.4 12/21/2017   CBC    Component Value Date/Time   WBC 10.1 12/21/2017 0959   WBC 11.5 (H) 07/24/2016 1238   RBC 5.00 12/21/2017 0959   RBC 5.42 (H) 07/24/2016 1238   HGB 10.8 (L) 12/21/2017  0959   HCT 33.5 (L) 12/21/2017 0959   PLT 443 (H) 08/04/2017 1006   MCV 67 (L) 12/21/2017 0959   MCH 21.6 (L) 12/21/2017 0959   MCH 23.1 (L) 07/24/2016 1238   MCHC 32.2 12/21/2017 0959   MCHC 33.6 07/24/2016 1238   RDW 15.7 (H) 12/21/2017 0959   LYMPHSABS 2.2 12/21/2017 0959   MONOABS 0.5 06/04/2014 0828   EOSABS 0.2 12/21/2017 0959   BASOSABS 0.0 12/21/2017 0959   Iron/TIBC/Ferritin/ %Sat    Component Value Date/Time   FERRITIN 19 08/04/2017 1006   Lipid Panel     Component Value Date/Time   CHOL 161 12/21/2017 0959   TRIG 90 12/21/2017 0959   HDL 43 12/21/2017 0959  CHOLHDL 3.8 08/04/2017 1006   CHOLHDL 3.6 07/24/2016 1238   VLDL 27 07/24/2016 1238   LDLCALC 100 (H) 12/21/2017 0959   Hepatic Function Panel     Component Value Date/Time   PROT 6.6 12/21/2017 0959   ALBUMIN 4.1 12/21/2017 0959   AST 16 12/21/2017 0959   ALT 13 12/21/2017 0959   ALKPHOS 68 12/21/2017 0959   BILITOT 0.2 12/21/2017 0959      Component Value Date/Time   TSH 1.270 12/21/2017 0959   TSH 1.140 08/04/2017 1006   TSH 0.96 07/24/2016 1238  Results for ARADHANA, GIN (MRN [id number]) as of 01/18/2018 15:08  Ref. Range 12/21/2017 09:59  Vitamin D, 25-Hydroxy Latest Ref Range: 30.0 - 100.0 ng/mL 45.5    ASSESSMENT AND PLAN: Prediabetes - Plan: metFORMIN (GLUCOPHAGE) 500 MG tablet  Vitamin D deficiency - Plan: Vitamin D, Ergocalciferol, (DRISDOL) 50000 units CAPS capsule  Other iron deficiency anemia - Plan: ferrous sulfate 325 (65 FE) MG tablet  Other constipation - Plan: polyethylene glycol powder (GLYCOLAX/MIRALAX) powder  At risk for diabetes mellitus  Class 3 severe obesity with serious comorbidity and body mass index (BMI) of 45.0 to 49.9 in adult, unspecified obesity type ([place])  PLAN:  Pre-Diabetes Heidi Robinson will continue to work on weight loss, exercise, and decreasing simple carbohydrates in her diet to help decrease the risk of diabetes. We dicussed metformin including benefits and  risks. She was informed that eating too many simple carbohydrates or too many calories at one sitting increases the likelihood of GI side effects. Heidi Robinson agrees to continue taking metformin 500 mg q AM #30 and we will refill for 1 month. Leanore agreed to follow up with our clinic in 3 weeks as directed to monitor her progress.  Diabetes risk counselling Heidi Robinson was given extended (15 minutes) diabetes prevention counseling today. She is 42 y.o. female and has risk factors for diabetes including obesity and pre-diabetes. We discussed intensive lifestyle modifications today with an emphasis on weight loss as well as increasing exercise and decreasing simple carbohydrates in her diet.  Vitamin D Deficiency Heidi Robinson was informed that low vitamin D levels contributes to fatigue and are associated with obesity, breast, and colon cancer. Heidi Robinson agrees to continue taking prescription Vit D @50 ,000 IU every week #4 and we will refill for 1 month. She will follow up for routine testing of vitamin D, at least 2-3 times per year. She was informed of the risk of over-replacement of vitamin D and agrees to not increase her dose unless she discusses this with [place] first. Heidi Robinson agrees to follow up with our clinic in 3 weeks.  Iron Deficiency Anemia The diagnosis of Iron deficiency anemia was discussed with Heidi Robinson and was explained in detail. She was given suggestions of iron rich foods. Heidi Robinson agrees to continue taking ferrous sulfate 325 mg (65 FE) q AM #30 and we will refill for 1 month. Heidi Robinson agrees to follow up with our clinic in 3 weeks.  Constipation Heidi Robinson was informed decrease bowel movement frequency is normal while losing weight, but stools should not be hard or painful. She was advised to increase her H20 intake and work on increasing her fiber intake. High fiber foods were discussed today. Heidi Robinson agrees to start miralax 17 g 1 time daily and discontinue colace. Heidi Robinson agrees to follow up with our clinic in 3 weeks.  Obesity Heidi Robinson is  currently in the action stage of change. As such, her goal is to continue with weight loss efforts She has  agreed to follow the Category 2 plan + 100 calories Heidi Robinson has been instructed to work up to a goal of 150 minutes of combined cardio and strengthening exercise per week for weight loss and overall health benefits. We discussed the following Behavioral Modification Strategies today: increasing lean protein intake, decreasing simple carbohydrates, work on meal planning and easy cooking plans, and keeping healthy foods in the home   Heidi Robinson has agreed to follow up with our clinic in 3 weeks. She was informed of the importance of frequent follow up visits to maximize her success with intensive lifestyle modifications for her multiple health conditions.   OBESITY BEHAVIORAL INTERVENTION VISIT  Today's visit was # 3 out of 22.  Starting weight: 258 lbs Starting date: 12/21/17 Today's weight : 246 lbs  Today's date: 01/18/2018 Total lbs lost to date: 12 (Patients must lose 7 lbs in the first 6 months to continue with counseling)   ASK: We discussed the diagnosis of obesity with Heidi Robinson today and Heidi Robinson agreed to give [place] permission to discuss obesity behavioral modification therapy today.  ASSESS: Heidi Robinson has the diagnosis of obesity and her BMI today is 44.98 Heidi Robinson is in the action stage of change   ADVISE: Heidi Robinson was educated on the multiple health risks of obesity as well as the benefit of weight loss to improve her health. She was advised of the need for long term treatment and the importance of lifestyle modifications.  AGREE: Multiple dietary modification options and treatment options were discussed and  Brieonna agreed to the above obesity treatment plan.  I, Trixie Dredge, am acting as transcriptionist for Dennard Nip, MD  I have reviewed the above documentation for accuracy and completeness, and I agree with the above. -Dennard Nip, MD

## 2018-02-10 ENCOUNTER — Encounter (INDEPENDENT_AMBULATORY_CARE_PROVIDER_SITE_OTHER): Payer: Self-pay | Admitting: Family Medicine

## 2018-02-10 ENCOUNTER — Ambulatory Visit (INDEPENDENT_AMBULATORY_CARE_PROVIDER_SITE_OTHER): Payer: 59 | Admitting: Family Medicine

## 2018-02-10 VITALS — BP 115/79 | HR 87 | Temp 97.9°F | Ht 62.0 in | Wt 241.0 lb

## 2018-02-10 DIAGNOSIS — Z9189 Other specified personal risk factors, not elsewhere classified: Secondary | ICD-10-CM | POA: Diagnosis not present

## 2018-02-10 DIAGNOSIS — Z6841 Body Mass Index (BMI) 40.0 and over, adult: Secondary | ICD-10-CM

## 2018-02-10 DIAGNOSIS — R7303 Prediabetes: Secondary | ICD-10-CM | POA: Diagnosis not present

## 2018-02-10 MED ORDER — METFORMIN HCL 500 MG PO TABS: 500.0000 mg | ORAL_TABLET | Freq: Two times a day (BID) | ORAL | 0 refills | Status: DC

## 2018-02-10 NOTE — Progress Notes (Signed)
Office: [phone]  /  Fax: [phone]   HPI:   Chief Complaint: OBESITY Heidi Robinson is here to discuss her progress with her obesity treatment plan. She is on the Category 2 plan +100 calories and is following her eating plan approximately 80 % of the time. She states she is exercising 0 minutes 0 times per week. Madisyn continues to do well with weight loss, but she is struggling to plan for weeks with kids playing sports and traveling. Her weight is 241 lb (109.3 kg) today and has had a weight loss of 5 pounds over a period of 3 weeks since her last visit. She has lost 17 lbs since starting treatment with [place].  Pre-Diabetes Shalen has a diagnosis of pre-diabetes based on her elevated Hgb A1c and was informed this puts her at greater risk of developing diabetes. She started metformin and had mild GI upset, but this has resolved. She still feels polyphagia in the afternoons. Gearldene continues to work on diet and exercise to decrease risk of diabetes.  At risk for diabetes Hlee is at higher than average risk for developing diabetes due to her obesity and pre-diabetes. She currently denies polyuria or polydipsia.  ALLERGIES: Allergies  Allergen Reactions  . Penicillins Anaphylaxis  . Food     Fish, Walnuts, Cashews, Almonds, Pecans, Blueberries, Pine nuts, Pistachios, Macadamia Nuts, Coconuts, Chestnuts    MEDICATIONS: Current Outpatient Medications on File Prior to Visit  Medication Sig Dispense Refill  . ferrous sulfate 325 (65 FE) MG tablet Take 1 tablet (325 mg total) by mouth daily with breakfast. 30 tablet 0  . lisinopril-hydrochlorothiazide (PRINZIDE,ZESTORETIC) 10-12.5 MG tablet Take 1 tablet by mouth daily. 90 tablet 3  . meloxicam (MOBIC) 15 MG tablet Take 1 tablet (15 mg total) by mouth daily as needed for pain. 30 tablet 5  . metFORMIN (GLUCOPHAGE) 500 MG tablet Take 1 tablet (500 mg total) by mouth daily with breakfast. 30 tablet 0  . Multiple Vitamin (MULTIVITAMINS PO) Take by  mouth.    . polyethylene glycol powder (GLYCOLAX/MIRALAX) powder Take 17 g by mouth daily. 3350 g 0  . Vitamin D, Ergocalciferol, (DRISDOL) 50000 units CAPS capsule Take 1 capsule (50,000 Units total) by mouth every 7 (seven) days. 4 capsule 0   No current facility-administered medications on file prior to visit.     PAST MEDICAL HISTORY: Past Medical History:  Diagnosis Date  . Allergy   . Anemia   . Arthritis   . Asthma   . Fibroids   . Hypertension   . Iron deficiency anemia due to chronic blood loss   . Obesity (BMI 30-39.9) 02/02/2012  . Sickle cell trait ([place]) 06/04/2014   hemoglobin electrophoresis confirmed    PAST SURGICAL HISTORY: Past Surgical History:  Procedure Laterality Date  . tonsillecomy    . TONSILLECTOMY  1997    SOCIAL HISTORY: Social History   Tobacco Use  . Smoking status: Former Smoker    Years: 2.00    Types: Cigarettes    Last attempt to quit: 07/11/2003    Years since quitting: 14.5  . Smokeless tobacco: Never Used  Substance Use Topics  . Alcohol use: No    Alcohol/week: 0.0 oz  . Drug use: No    FAMILY HISTORY: Family History  Problem Relation Age of Onset  . Hypertension Mother   . Anxiety disorder Mother   . Obesity Mother   . Kidney disease Brother   . Hypertension Maternal Grandmother   . Cancer Maternal  Grandmother        bladder  . Hyperlipidemia Father   . Cancer Paternal Grandfather        prostate  . Breast cancer Neg Hx     ROS: Review of Systems  Constitutional: Positive for weight loss.  Gastrointestinal: Negative for diarrhea and nausea.  Genitourinary: Negative for frequency.  Endo/Heme/Allergies: Negative for polydipsia.       Positive for polyphagia    PHYSICAL EXAM: Blood pressure 115/79, pulse 87, temperature 97.9 F (36.6 C), temperature source Oral, height 5\' 2"  (1.575 m), weight 241 lb (109.3 kg), last menstrual period 01/15/2018, SpO2 97 %. Body mass index is 44.08 kg/m. Physical Exam    Constitutional: She is oriented to person, place, and time. She appears well-developed and well-nourished.  Cardiovascular: Normal rate.  Pulmonary/Chest: Effort normal.  Musculoskeletal: Normal range of motion.  Neurological: She is oriented to person, place, and time.  Skin: Skin is warm and dry.  Psychiatric: She has a normal mood and affect. Her behavior is normal.  Vitals reviewed.   RECENT LABS AND TESTS: BMET    Component Value Date/Time   NA 138 12/21/2017 0959   K 4.0 12/21/2017 0959   CL 100 12/21/2017 0959   CO2 22 12/21/2017 0959   GLUCOSE 86 12/21/2017 0959   GLUCOSE 82 07/24/2016 1238   BUN 10 12/21/2017 0959   CREATININE 0.73 12/21/2017 0959   CREATININE 0.80 07/24/2016 1238   CALCIUM 9.1 12/21/2017 0959   GFRNONAA 103 12/21/2017 0959   GFRAA 118 12/21/2017 0959   Lab Results  Component Value Date   HGBA1C 5.7 (H) 12/21/2017   HGBA1C 5.2 08/04/2017   HGBA1C 5.3 07/24/2016   HGBA1C 5.6 07/31/2015   Lab Results  Component Value Date   INSULIN 23.4 12/21/2017   CBC    Component Value Date/Time   WBC 10.1 12/21/2017 0959   WBC 11.5 (H) 07/24/2016 1238   RBC 5.00 12/21/2017 0959   RBC 5.42 (H) 07/24/2016 1238   HGB 10.8 (L) 12/21/2017 0959   HCT 33.5 (L) 12/21/2017 0959   PLT 443 (H) 08/04/2017 1006   MCV 67 (L) 12/21/2017 0959   MCH 21.6 (L) 12/21/2017 0959   MCH 23.1 (L) 07/24/2016 1238   MCHC 32.2 12/21/2017 0959   MCHC 33.6 07/24/2016 1238   RDW 15.7 (H) 12/21/2017 0959   LYMPHSABS 2.2 12/21/2017 0959   MONOABS 0.5 06/04/2014 0828   EOSABS 0.2 12/21/2017 0959   BASOSABS 0.0 12/21/2017 0959   Iron/TIBC/Ferritin/ %Sat    Component Value Date/Time   FERRITIN 19 08/04/2017 1006   Lipid Panel     Component Value Date/Time   CHOL 161 12/21/2017 0959   TRIG 90 12/21/2017 0959   HDL 43 12/21/2017 0959   CHOLHDL 3.8 08/04/2017 1006   CHOLHDL 3.6 07/24/2016 1238   VLDL 27 07/24/2016 1238   LDLCALC 100 (H) 12/21/2017 0959   Hepatic  Function Panel     Component Value Date/Time   PROT 6.6 12/21/2017 0959   ALBUMIN 4.1 12/21/2017 0959   AST 16 12/21/2017 0959   ALT 13 12/21/2017 0959   ALKPHOS 68 12/21/2017 0959   BILITOT 0.2 12/21/2017 0959      Component Value Date/Time   TSH 1.270 12/21/2017 0959   TSH 1.140 08/04/2017 1006   TSH 0.96 07/24/2016 1238   Results for DEVAEH, AMADI (MRN [id number]) as of 02/10/2018 14:55  Ref. Range 12/21/2017 09:59  Vitamin D, 25-Hydroxy Latest Ref Range: 30.0 - 100.0  ng/mL 45.5   ASSESSMENT AND PLAN: Prediabetes - Plan: metFORMIN (GLUCOPHAGE) 500 MG tablet  At risk for diabetes mellitus  Class 3 severe obesity with serious comorbidity and body mass index (BMI) of 45.0 to 49.9 in adult, unspecified obesity type ([place])  PLAN:  Pre-Diabetes Renarda will continue to work on weight loss, exercise, and decreasing simple carbohydrates in her diet to help decrease the risk of diabetes. We dicussed metformin including benefits and risks. She was informed that eating too many simple carbohydrates or too many calories at one sitting increases the likelihood of GI side effects. Aila agreed to increase metformin to 500 mg bid #60 with no refills and follow up with [place] as directed to monitor her progress.  Diabetes risk counseling Eiley was given extended (15 minutes) diabetes prevention counseling today. She is 41 y.o. female and has risk factors for diabetes including obesity and pre-diabetes. We discussed intensive lifestyle modifications today with an emphasis on weight loss as well as increasing exercise and decreasing simple carbohydrates in her diet.  Obesity Emmaly is currently in the action stage of change. As such, her goal is to continue with weight loss efforts She has agreed to follow the Category 2 plan Telitha has been instructed to work up to a goal of 150 minutes of combined cardio and strengthening exercise per week or start walking  For 30 minutes 3 times per week for weight loss and  overall health benefits. We discussed the following Behavioral Modification Strategies today: increasing lean protein intake and decreasing simple carbohydrates   Anberlyn has agreed to follow up with our clinic in 2 weeks. She was informed of the importance of frequent follow up visits to maximize her success with intensive lifestyle modifications for her multiple health conditions.   OBESITY BEHAVIORAL INTERVENTION VISIT  Today's visit was # 4 out of 22.  Starting weight: 258 lbs Starting date: 12/21/17 Today's weight : 241 lbs Today's date: 02/10/2018 Total lbs lost to date: 17 (Patients must lose 7 lbs in the first 6 months to continue with counseling)   ASK: We discussed the diagnosis of obesity with Cristie Hem today and Vivika agreed to give [place] permission to discuss obesity behavioral modification therapy today.  ASSESS: Shayden has the diagnosis of obesity and her BMI today is 44.07 Delila is in the action stage of change   ADVISE: Aastha was educated on the multiple health risks of obesity as well as the benefit of weight loss to improve her health. She was advised of the need for long term treatment and the importance of lifestyle modifications.  AGREE: Multiple dietary modification options and treatment options were discussed and  Treesa agreed to the above obesity treatment plan.  I, Doreene Nest, am acting as transcriptionist for Dennard Nip, MD  I have reviewed the above documentation for accuracy and completeness, and I agree with the above. -Dennard Nip, MD

## 2018-02-23 ENCOUNTER — Ambulatory Visit (INDEPENDENT_AMBULATORY_CARE_PROVIDER_SITE_OTHER): Payer: 59 | Admitting: Family Medicine

## 2018-02-23 VITALS — BP 112/75 | HR 86 | Temp 98.3°F | Ht 62.0 in | Wt 236.0 lb

## 2018-02-23 DIAGNOSIS — K5909 Other constipation: Secondary | ICD-10-CM | POA: Diagnosis not present

## 2018-02-23 DIAGNOSIS — Z6841 Body Mass Index (BMI) 40.0 and over, adult: Secondary | ICD-10-CM | POA: Diagnosis not present

## 2018-02-23 DIAGNOSIS — Z9189 Other specified personal risk factors, not elsewhere classified: Secondary | ICD-10-CM | POA: Diagnosis not present

## 2018-02-23 DIAGNOSIS — D508 Other iron deficiency anemias: Secondary | ICD-10-CM

## 2018-02-23 DIAGNOSIS — R7303 Prediabetes: Secondary | ICD-10-CM

## 2018-02-23 MED ORDER — METFORMIN HCL 500 MG PO TABS: 500.0000 mg | ORAL_TABLET | Freq: Two times a day (BID) | ORAL | 0 refills | Status: DC

## 2018-02-23 MED ORDER — POLYETHYLENE GLYCOL 3350 17 GM/SCOOP PO POWD: 17.0000 g | Freq: Every day | ORAL | 0 refills | Status: DC

## 2018-02-23 MED ORDER — FERROUS SULFATE 325 (65 FE) MG PO TABS: 325.0000 mg | ORAL_TABLET | Freq: Every day | ORAL | 0 refills | Status: DC

## 2018-02-23 NOTE — Progress Notes (Signed)
Office: [phone]  /  Fax: [phone]   HPI:   Chief Complaint: OBESITY Heidi Robinson is here to discuss her progress with her obesity treatment plan. She is on the Category 2 plan and is following her eating plan approximately 85 % of the time. She states she is walking for 30 minutes 2 times per week. Heidi Robinson continues to do well with weight loss on Category 2 plan. She is working on planning meals ahead of time. She is working on traveling strategies and lean protein and vegetables.  Her weight is 236 lb (107 kg) today and has had a weight loss of 5 pounds over a period of 2 weeks since her last visit. She has lost 22 lbs since starting treatment with [place].  Constipation [name] notes constipation for the last few weeks, worse since attempting weight loss. She states BM are less frequent and are not hard and painful. She denies abdominal pain or bleeding. She denies drinking less H20 recently.  Iron Deficiency Anemia Heidi Robinson has a diagnosis of iron deficiency anemia. She is stable on iron rich diet and Fesoy, no GI upset.    Pre-Diabetes Heidi Robinson has a diagnosis of pre-diabetes based on her elevated Hgb A1c and was informed this puts her at greater risk of developing diabetes. She is stable on metformin and she denies nausea, vomiting, or hypoglycemia, doing well on diet prescription.  At risk for diabetes Heidi Robinson is at higher than average risk for developing diabetes due to her obesity and pre-diabetes. She currently denies polyuria or polydipsia.  ALLERGIES: Allergies  Allergen Reactions  . Penicillins Anaphylaxis  . Food     Fish, Walnuts, Cashews, Almonds, Pecans, Blueberries, Pine nuts, Pistachios, Macadamia Nuts, Coconuts, Chestnuts    MEDICATIONS: Current Outpatient Medications on File Prior to Visit  Medication Sig Dispense Refill  . lisinopril-hydrochlorothiazide (PRINZIDE,ZESTORETIC) 10-12.5 MG tablet Take 1 tablet by mouth daily. 90 tablet 3  . meloxicam (MOBIC) 15 MG tablet Take 1  tablet (15 mg total) by mouth daily as needed for pain. 30 tablet 5  . Multiple Vitamin (MULTIVITAMINS PO) Take by mouth.    . Vitamin D, Ergocalciferol, (DRISDOL) 50000 units CAPS capsule Take 1 capsule (50,000 Units total) by mouth every 7 (seven) days. 4 capsule 0   No current facility-administered medications on file prior to visit.     PAST MEDICAL HISTORY: Past Medical History:  Diagnosis Date  . Allergy   . Anemia   . Arthritis   . Asthma   . Fibroids   . Hypertension   . Iron deficiency anemia due to chronic blood loss   . Obesity (BMI 30-39.9) 02/02/2012  . Sickle cell trait ([place]) 06/04/2014   hemoglobin electrophoresis confirmed    PAST SURGICAL HISTORY: Past Surgical History:  Procedure Laterality Date  . tonsillecomy    . TONSILLECTOMY  1997    SOCIAL HISTORY: Social History   Tobacco Use  . Smoking status: Former Smoker    Years: 2.00    Types: Cigarettes    Last attempt to quit: 07/11/2003    Years since quitting: 14.6  . Smokeless tobacco: Never Used  Substance Use Topics  . Alcohol use: No    Alcohol/week: 0.0 oz  . Drug use: No    FAMILY HISTORY: Family History  Problem Relation Age of Onset  . Hypertension Mother   . Anxiety disorder Mother   . Obesity Mother   . Kidney disease Brother   . Hypertension Maternal Grandmother   . Cancer Maternal  Grandmother        bladder  . Hyperlipidemia Father   . Cancer Paternal Grandfather        prostate  . Breast cancer Neg Hx     ROS: Review of Systems  Constitutional: Positive for weight loss.  Cardiovascular: Positive for claudication.  Gastrointestinal: Positive for constipation. Negative for abdominal pain, blood in stool, nausea and vomiting.  Genitourinary: Negative for frequency.  Endo/Heme/Allergies: Negative for polydipsia.       Negative hypoglycemia    PHYSICAL EXAM: Blood pressure 112/75, pulse 86, temperature 98.3 F (36.8 C), temperature source Oral, height 5\' 2"  (1.575 m),  weight 236 lb (107 kg), last menstrual period 02/09/2018, SpO2 99 %. Body mass index is 43.16 kg/m. Physical Exam  Constitutional: She is oriented to person, place, and time. She appears well-developed and well-nourished.  Cardiovascular: Normal rate.  Pulmonary/Chest: Effort normal.  Musculoskeletal: Normal range of motion.  Neurological: She is oriented to person, place, and time.  Skin: Skin is warm and dry.  Psychiatric: She has a normal mood and affect. Her behavior is normal.  Vitals reviewed.   RECENT LABS AND TESTS: BMET    Component Value Date/Time   NA 138 12/21/2017 0959   K 4.0 12/21/2017 0959   CL 100 12/21/2017 0959   CO2 22 12/21/2017 0959   GLUCOSE 86 12/21/2017 0959   GLUCOSE 82 07/24/2016 1238   BUN 10 12/21/2017 0959   CREATININE 0.73 12/21/2017 0959   CREATININE 0.80 07/24/2016 1238   CALCIUM 9.1 12/21/2017 0959   GFRNONAA 103 12/21/2017 0959   GFRAA 118 12/21/2017 0959   Lab Results  Component Value Date   HGBA1C 5.7 (H) 12/21/2017   HGBA1C 5.2 08/04/2017   HGBA1C 5.3 07/24/2016   HGBA1C 5.6 07/31/2015   Lab Results  Component Value Date   INSULIN 23.4 12/21/2017   CBC    Component Value Date/Time   WBC 10.1 12/21/2017 0959   WBC 11.5 (H) 07/24/2016 1238   RBC 5.00 12/21/2017 0959   RBC 5.42 (H) 07/24/2016 1238   HGB 10.8 (L) 12/21/2017 0959   HCT 33.5 (L) 12/21/2017 0959   PLT 443 (H) 08/04/2017 1006   MCV 67 (L) 12/21/2017 0959   MCH 21.6 (L) 12/21/2017 0959   MCH 23.1 (L) 07/24/2016 1238   MCHC 32.2 12/21/2017 0959   MCHC 33.6 07/24/2016 1238   RDW 15.7 (H) 12/21/2017 0959   LYMPHSABS 2.2 12/21/2017 0959   MONOABS 0.5 06/04/2014 0828   EOSABS 0.2 12/21/2017 0959   BASOSABS 0.0 12/21/2017 0959   Iron/TIBC/Ferritin/ %Sat    Component Value Date/Time   FERRITIN 19 08/04/2017 1006   Lipid Panel     Component Value Date/Time   CHOL 161 12/21/2017 0959   TRIG 90 12/21/2017 0959   HDL 43 12/21/2017 0959   CHOLHDL 3.8  08/04/2017 1006   CHOLHDL 3.6 07/24/2016 1238   VLDL 27 07/24/2016 1238   LDLCALC 100 (H) 12/21/2017 0959   Hepatic Function Panel     Component Value Date/Time   PROT 6.6 12/21/2017 0959   ALBUMIN 4.1 12/21/2017 0959   AST 16 12/21/2017 0959   ALT 13 12/21/2017 0959   ALKPHOS 68 12/21/2017 0959   BILITOT 0.2 12/21/2017 0959      Component Value Date/Time   TSH 1.270 12/21/2017 0959   TSH 1.140 08/04/2017 1006   TSH 0.96 07/24/2016 1238    ASSESSMENT AND PLAN: Other constipation - Plan: polyethylene glycol powder (GLYCOLAX/MIRALAX) powder  Other  iron deficiency anemia - Plan: ferrous sulfate 325 (65 FE) MG tablet  Prediabetes - Plan: metFORMIN (GLUCOPHAGE) 500 MG tablet  At risk for diabetes mellitus  Class 3 severe obesity with serious comorbidity and body mass index (BMI) of 40.0 to 44.9 in adult, unspecified obesity type ([facility])  PLAN:  Constipation Madelina was informed decrease bowel movement frequency is normal while losing weight, but stools should not be hard or painful. She was advised to increase her H20 intake and work on increasing her fiber intake. High fiber foods were discussed today. Caterine agrees to continue taking miralax 17 g 1 capful daily and we will refill for 1 month. Ninel agrees to follow up with our clinic in 2 to 3 weeks.  Iron Deficiency Anemia The diagnosis of Iron deficiency anemia was discussed with [place] and was explained in detail. She was given suggestions of iron rich foods. Rishita agrees to continue taking Fesoy 325 mg q AM #30 and we will refill for 1 month. Wauneta agrees to follow up with our clinic in 2 to 3 weeks.  Pre-Diabetes Tyyne will continue to work on weight loss, diet, exercise, and decreasing simple carbohydrates in her diet to help decrease the risk of diabetes. We dicussed metformin including benefits and risks. She was informed that eating too many simple carbohydrates or too many calories at one sitting increases the likelihood of GI  side effects. Sarabella agrees to continue taking metformin 500 mg BID #60 and we will refill for 1 month. Gerda agrees to follow up with our clinic in 2 to 3 weeks as directed to monitor her progress.  Diabetes risk counselling Joslyne was given extended (15 minutes) diabetes prevention counseling today. She is 42 y.o. female and has risk factors for diabetes including obesity and pre-diabetes. We discussed intensive lifestyle modifications today with an emphasis on weight loss as well as increasing exercise and decreasing simple carbohydrates in her diet.  Obesity Treva is currently in the action stage of change. As such, her goal is to continue with weight loss efforts She has agreed to follow the Category 2 plan Raiden has been instructed to work up to a goal of 150 minutes of combined cardio and strengthening exercise per week for weight loss and overall health benefits. We discussed the following Behavioral Modification Strategies today: increasing lean protein intake, decreasing simple carbohydrates, travel eating strategies     Aron has agreed to follow up with our clinic in 2 to 3 weeks. She was informed of the importance of frequent follow up visits to maximize her success with intensive lifestyle modifications for her multiple health conditions.   OBESITY BEHAVIORAL INTERVENTION VISIT  Today's visit was # 5 out of 22.  Starting weight: 258 lbs Starting date: 12/21/17 Today's weight : 236 lbs Today's date: 02/23/2018 Total lbs lost to date: 32 (Patients must lose 7 lbs in the first 6 months to continue with counseling)   ASK: We discussed the diagnosis of obesity with Cristie Hem today and Elivia agreed to give [place] permission to discuss obesity behavioral modification therapy today.  ASSESS: Ilisa has the diagnosis of obesity and her BMI today is 43.15 Cherylyn is in the action stage of change   ADVISE: Shamel was educated on the multiple health risks of obesity as well as the benefit of weight  loss to improve her health. She was advised of the need for long term treatment and the importance of lifestyle modifications.  AGREE: Multiple dietary modification options and treatment  options were discussed and  Saralee agreed to the above obesity treatment plan.  I, Trixie Dredge, am acting as transcriptionist for Dennard Nip, MD  I have reviewed the above documentation for accuracy and completeness, and I agree with the above. -Dennard Nip, MD

## 2018-02-24 ENCOUNTER — Ambulatory Visit (INDEPENDENT_AMBULATORY_CARE_PROVIDER_SITE_OTHER): Payer: 59 | Admitting: Family Medicine

## 2018-03-16 ENCOUNTER — Ambulatory Visit (INDEPENDENT_AMBULATORY_CARE_PROVIDER_SITE_OTHER): Payer: 59 | Admitting: Family Medicine

## 2018-03-16 VITALS — BP 125/80 | HR 91 | Temp 97.4°F | Ht 62.0 in | Wt 236.0 lb

## 2018-03-16 DIAGNOSIS — D508 Other iron deficiency anemias: Secondary | ICD-10-CM | POA: Diagnosis not present

## 2018-03-16 DIAGNOSIS — R7303 Prediabetes: Secondary | ICD-10-CM | POA: Diagnosis not present

## 2018-03-16 DIAGNOSIS — E559 Vitamin D deficiency, unspecified: Secondary | ICD-10-CM | POA: Diagnosis not present

## 2018-03-16 DIAGNOSIS — Z6841 Body Mass Index (BMI) 40.0 and over, adult: Secondary | ICD-10-CM

## 2018-03-16 DIAGNOSIS — Z9189 Other specified personal risk factors, not elsewhere classified: Secondary | ICD-10-CM

## 2018-03-16 MED ORDER — METFORMIN HCL 500 MG PO TABS: 500.0000 mg | ORAL_TABLET | Freq: Two times a day (BID) | ORAL | 0 refills | Status: DC

## 2018-03-16 MED ORDER — VITAMIN D (ERGOCALCIFEROL) 1.25 MG (50000 UNIT) PO CAPS: 50000.0000 [IU] | ORAL_CAPSULE | ORAL | 0 refills | Status: DC

## 2018-03-16 MED ORDER — FERROUS SULFATE 325 (65 FE) MG PO TABS: 325.0000 mg | ORAL_TABLET | Freq: Every day | ORAL | 0 refills | Status: DC
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
## 2018-03-29 ENCOUNTER — Ambulatory Visit (INDEPENDENT_AMBULATORY_CARE_PROVIDER_SITE_OTHER): Payer: 59 | Admitting: Family Medicine

## 2018-03-29 VITALS — BP 120/73 | HR 80 | Temp 98.5°F | Ht 62.0 in | Wt 236.0 lb

## 2018-03-29 DIAGNOSIS — Z6841 Body Mass Index (BMI) 40.0 and over, adult: Secondary | ICD-10-CM

## 2018-03-29 DIAGNOSIS — R7303 Prediabetes: Secondary | ICD-10-CM | POA: Diagnosis not present

## 2018-03-30 NOTE — Progress Notes (Signed)
Office: [phone]  /  Fax: [phone]   HPI:   Chief Complaint: OBESITY Heidi Robinson is here to discuss her progress with her obesity treatment plan. She is on the Category 2 plan and is following her eating plan approximately 80 % of the time. She states she is walking for 30 to 40 minutes 3 times per week. Heidi Robinson continues to do well maintaining weight, but is discouraged as she seems to be plateauing. Her weight is 236 lb (107 kg) today and she has maintained weight over a period of 2 weeks since her last visit. She has lost 22 lbs since starting treatment with [place].  Pre-Diabetes Heidi Robinson has a diagnosis of prediabetes based on her elevated Hgb A1c and was informed this puts her at greater risk of developing diabetes. She is stable on metformin two times daily, but she still struggles with polyphagia. Heidi Robinson continues to work on diet and exercise to decrease risk of diabetes. She denies nausea, vomiting or hypoglycemia.    ALLERGIES: Allergies  Allergen Reactions  . Penicillins Anaphylaxis  . Food     Fish, Walnuts, Cashews, Almonds, Pecans, Blueberries, Pine nuts, Pistachios, Macadamia Nuts, Coconuts, Chestnuts    MEDICATIONS: Current Outpatient Medications on File Prior to Visit  Medication Sig Dispense Refill  . ferrous sulfate 325 (65 FE) MG tablet Take 1 tablet (325 mg total) by mouth daily with breakfast. 30 tablet 0  . lisinopril-hydrochlorothiazide (PRINZIDE,ZESTORETIC) 10-12.5 MG tablet Take 1 tablet by mouth daily. 90 tablet 3  . meloxicam (MOBIC) 15 MG tablet Take 1 tablet (15 mg total) by mouth daily as needed for pain. 30 tablet 5  . metFORMIN (GLUCOPHAGE) 500 MG tablet Take 1 tablet (500 mg total) by mouth 2 (two) times daily with a meal. 60 tablet 0  . Multiple Vitamin (MULTIVITAMINS PO) Take by mouth.    . polyethylene glycol powder (GLYCOLAX/MIRALAX) powder Take 17 g by mouth daily. 3350 g 0  . Vitamin D, Ergocalciferol, (DRISDOL) 50000 units CAPS capsule Take 1 capsule  (50,000 Units total) by mouth every 7 (seven) days. 4 capsule 0   No current facility-administered medications on file prior to visit.     PAST MEDICAL HISTORY: Past Medical History:  Diagnosis Date  . Allergy   . Anemia   . Arthritis   . Asthma   . Fibroids   . Hypertension   . Iron deficiency anemia due to chronic blood loss   . Obesity (BMI 30-39.9) 02/02/2012  . Sickle cell trait ([place]) 06/04/2014   hemoglobin electrophoresis confirmed    PAST SURGICAL HISTORY: Past Surgical History:  Procedure Laterality Date  . tonsillecomy    . TONSILLECTOMY  1997    SOCIAL HISTORY: Social History   Tobacco Use  . Smoking status: Former Smoker    Years: 2.00    Types: Cigarettes    Last attempt to quit: 07/11/2003    Years since quitting: 14.7  . Smokeless tobacco: Never Used  Substance Use Topics  . Alcohol use: No    Alcohol/week: 0.0 oz  . Drug use: No    FAMILY HISTORY: Family History  Problem Relation Age of Onset  . Hypertension Mother   . Anxiety disorder Mother   . Obesity Mother   . Kidney disease Brother   . Hypertension Maternal Grandmother   . Cancer Maternal Grandmother        bladder  . Hyperlipidemia Father   . Cancer Paternal Grandfather        prostate  .  Breast cancer Neg Hx     ROS: Review of Systems  Constitutional: Negative for weight loss.  Gastrointestinal: Negative for nausea and vomiting.  Endo/Heme/Allergies:       Positive for polyphagia Negative for hypoglycemia    PHYSICAL EXAM: Blood pressure 120/73, pulse 80, temperature 98.5 F (36.9 C), temperature source Oral, height 5\' 2"  (1.575 m), weight 236 lb (107 kg), last menstrual period 03/13/2018, SpO2 97 %. Body mass index is 43.16 kg/m. Physical Exam  Constitutional: She is oriented to person, place, and time. She appears well-developed and well-nourished.  Cardiovascular: Normal rate.  Pulmonary/Chest: Effort normal.  Musculoskeletal: Normal range of motion.  Neurological:  She is oriented to person, place, and time.  Skin: Skin is warm and dry.  Psychiatric: She has a normal mood and affect. Her behavior is normal.  Vitals reviewed.   RECENT LABS AND TESTS: BMET    Component Value Date/Time   NA 138 12/21/2017 0959   K 4.0 12/21/2017 0959   CL 100 12/21/2017 0959   CO2 22 12/21/2017 0959   GLUCOSE 86 12/21/2017 0959   GLUCOSE 82 07/24/2016 1238   BUN 10 12/21/2017 0959   CREATININE 0.73 12/21/2017 0959   CREATININE 0.80 07/24/2016 1238   CALCIUM 9.1 12/21/2017 0959   GFRNONAA 103 12/21/2017 0959   GFRAA 118 12/21/2017 0959   Lab Results  Component Value Date   HGBA1C 5.7 (H) 12/21/2017   HGBA1C 5.2 08/04/2017   HGBA1C 5.3 07/24/2016   HGBA1C 5.6 07/31/2015   Lab Results  Component Value Date   INSULIN 23.4 12/21/2017   CBC    Component Value Date/Time   WBC 10.1 12/21/2017 0959   WBC 11.5 (H) 07/24/2016 1238   RBC 5.00 12/21/2017 0959   RBC 5.42 (H) 07/24/2016 1238   HGB 10.8 (L) 12/21/2017 0959   HCT 33.5 (L) 12/21/2017 0959   PLT 443 (H) 08/04/2017 1006   MCV 67 (L) 12/21/2017 0959   MCH 21.6 (L) 12/21/2017 0959   MCH 23.1 (L) 07/24/2016 1238   MCHC 32.2 12/21/2017 0959   MCHC 33.6 07/24/2016 1238   RDW 15.7 (H) 12/21/2017 0959   LYMPHSABS 2.2 12/21/2017 0959   MONOABS 0.5 06/04/2014 0828   EOSABS 0.2 12/21/2017 0959   BASOSABS 0.0 12/21/2017 0959   Iron/TIBC/Ferritin/ %Sat    Component Value Date/Time   FERRITIN 19 08/04/2017 1006   Lipid Panel     Component Value Date/Time   CHOL 161 12/21/2017 0959   TRIG 90 12/21/2017 0959   HDL 43 12/21/2017 0959   CHOLHDL 3.8 08/04/2017 1006   CHOLHDL 3.6 07/24/2016 1238   VLDL 27 07/24/2016 1238   LDLCALC 100 (H) 12/21/2017 0959   Hepatic Function Panel     Component Value Date/Time   PROT 6.6 12/21/2017 0959   ALBUMIN 4.1 12/21/2017 0959   AST 16 12/21/2017 0959   ALT 13 12/21/2017 0959   ALKPHOS 68 12/21/2017 0959   BILITOT 0.2 12/21/2017 0959      Component  Value Date/Time   TSH 1.270 12/21/2017 0959   TSH 1.140 08/04/2017 1006   TSH 0.96 07/24/2016 1238   Results for ELIZABELLA, NOLET (MRN [id number]) as of 03/30/2018 15:41  Ref. Range 12/21/2017 09:59  Vitamin D, 25-Hydroxy Latest Ref Range: 30.0 - 100.0 ng/mL 45.5   ASSESSMENT AND PLAN: Prediabetes  Class 3 severe obesity with serious comorbidity and body mass index (BMI) of 40.0 to 44.9 in adult, unspecified obesity type ([place])  PLAN:  Pre-Diabetes  Heidi Robinson will continue to work on weight loss, exercise, and decreasing simple carbohydrates in her diet to help decrease the risk of diabetes. We dicussed metformin including benefits and risks. She was informed that eating too many simple carbohydrates or too many calories at one sitting increases the likelihood of GI side effects. Heidi Robinson  Agrees to continue metformin and work on increasing her protein. We will recheck labs in 1 month and Heidi Robinson agreed to follow up with [place] as directed to monitor her progress.  We spent > than 50% of the 30 minute visit on the counseling as documented in the note.  Obesity Heidi Robinson is currently in the action stage of change. As such, her goal is to continue with weight loss efforts She has agreed to follow the Category 2 plan Heidi Robinson has been instructed to work up to a goal of 150 minutes of combined cardio and strengthening exercise per week for weight loss and overall health benefits. We discussed the following Behavioral Modification Strategies today: increasing lean protein intake, decreasing simple carbohydrates  and work on meal planning and easy cooking plans  Patient was given encouragement, that she is doing well overall.  Heidi Robinson has agreed to follow up with our clinic in 3 weeks. She was informed of the importance of frequent follow up visits to maximize her success with intensive lifestyle modifications for her multiple health conditions.   OBESITY BEHAVIORAL INTERVENTION VISIT  Today's visit was # 7 out of  22.  Starting weight: 258 lbs Starting date: 12/21/17 Today's weight : 236 lbs  Today's date: 03/29/2018 Total lbs lost to date: 8 (Patients must lose 7 lbs in the first 6 months to continue with counseling)   ASK: We discussed the diagnosis of obesity with Heidi Robinson today and Hien agreed to give [place] permission to discuss obesity behavioral modification therapy today.  ASSESS: Heidi Robinson has the diagnosis of obesity and her BMI today is 43.15 Heidi Robinson is in the action stage of change   ADVISE: Heidi Robinson was educated on the multiple health risks of obesity as well as the benefit of weight loss to improve her health. She was advised of the need for long term treatment and the importance of lifestyle modifications.  AGREE: Multiple dietary modification options and treatment options were discussed and  Heidi Robinson agreed to the above obesity treatment plan.  I, Doreene Nest, am acting as transcriptionist for Dennard Nip, MD  I have reviewed the above documentation for accuracy and completeness, and I agree with the above. -Dennard Nip, MD

## 2018-04-20 ENCOUNTER — Ambulatory Visit (INDEPENDENT_AMBULATORY_CARE_PROVIDER_SITE_OTHER): Payer: 59 | Admitting: Family Medicine

## 2018-04-20 ENCOUNTER — Telehealth (INDEPENDENT_AMBULATORY_CARE_PROVIDER_SITE_OTHER): Payer: Self-pay | Admitting: Family Medicine

## 2018-04-20 VITALS — BP 115/73 | HR 83 | Temp 98.2°F | Ht 62.0 in | Wt 231.0 lb

## 2018-04-20 DIAGNOSIS — E559 Vitamin D deficiency, unspecified: Secondary | ICD-10-CM | POA: Diagnosis not present

## 2018-04-20 DIAGNOSIS — D508 Other iron deficiency anemias: Secondary | ICD-10-CM

## 2018-04-20 DIAGNOSIS — Z9189 Other specified personal risk factors, not elsewhere classified: Secondary | ICD-10-CM | POA: Diagnosis not present

## 2018-04-20 DIAGNOSIS — F3289 Other specified depressive episodes: Secondary | ICD-10-CM

## 2018-04-20 DIAGNOSIS — Z6841 Body Mass Index (BMI) 40.0 and over, adult: Secondary | ICD-10-CM

## 2018-04-20 DIAGNOSIS — R7303 Prediabetes: Secondary | ICD-10-CM | POA: Diagnosis not present

## 2018-04-20 DIAGNOSIS — K5909 Other constipation: Secondary | ICD-10-CM

## 2018-04-20 MED ORDER — FERROUS SULFATE 325 (65 FE) MG PO TABS: 325.0000 mg | ORAL_TABLET | Freq: Every day | ORAL | 0 refills | Status: DC

## 2018-04-20 MED ORDER — VITAMIN D (ERGOCALCIFEROL) 1.25 MG (50000 UNIT) PO CAPS: 50000.0000 [IU] | ORAL_CAPSULE | ORAL | 0 refills | Status: DC

## 2018-04-20 MED ORDER — METFORMIN HCL 500 MG PO TABS: 500.0000 mg | ORAL_TABLET | Freq: Two times a day (BID) | ORAL | 0 refills | Status: DC

## 2018-04-20 MED ORDER — POLYETHYLENE GLYCOL 3350 17 GM/SCOOP PO POWD: 17.0000 g | Freq: Every day | ORAL | 0 refills | Status: DC

## 2018-04-20 NOTE — Telephone Encounter (Signed)
Due to time & cost patient stated she may not be able to schedule an appointment to see Dr. Mallie Mussel as instructed by Dr. Robynn Pane.

## 2018-04-21 NOTE — Progress Notes (Signed)
Office: [phone]  /  Fax: [phone]   HPI:   Chief Complaint: OBESITY Heidi Robinson is here to discuss her progress with her obesity treatment plan. She is on the Category 2 plan and is following her eating plan approximately 50 % of the time. She states she is walking for 20 to 30 minutes 2 times per week. Heidi Robinson has increased stressors at home and at work. She notes increased emotional eating some days and she is not eating at all on other days. Her weight is 231 lb (104.8 kg) today and has had a weight loss of 5 pounds over a period of 3 weeks since her last visit. She has lost 27 lbs since starting treatment with [place].  Vitamin D deficiency Heidi Robinson has a diagnosis of vitamin D deficiency. She is stable on vit D and denies nausea, vomiting or muscle weakness.  Pre-Diabetes Heidi Robinson has a diagnosis of prediabetes based on her elevated Hgb A1c and was informed this puts her at greater risk of developing diabetes. She is stable on metformin currently and continues to work on diet and exercise to decrease risk of diabetes. Heidi Robinson has decreased polyphagia and she denies nausea, vomiting or hypoglycemia.  At risk for diabetes Heidi Robinson is at higher than average risk for developing diabetes due to her obesity and pre-diabetes. She currently denies polyuria or polydipsia.  Iron Deficiency Anemia Heidi Robinson has a diagnosis of anemia.  She still notes fatigue. Heidi Robinson is stable on FeSO4 and she is due for labs soon.  Constipation Heidi Robinson is stable on Miralax and bowel movements are more comfortable. Gabrella denies abdominal pain, hematochezia or melena.  Depression with emotional eating behaviors Heidi Robinson notes increased emotional eating and she is frustrated that she is struggling so much. Heidi Robinson struggles with emotional eating and using food for comfort to the extent that it is negatively impacting her health. She often snacks when she is not hungry. Heidi Robinson sometimes feels she is out of control and then feels guilty that she made  poor food choices. She has been working on behavior modification techniques to help reduce her emotional eating and has been somewhat successful. She shows no sign of suicidal or homicidal ideations.  Depression screen [facility] 2/9 12/21/2017 11/06/2017 08/04/2017 07/24/2016 05/04/2016  Decreased Interest 3 0 0 0 0  Down, Depressed, Hopeless 3 0 0 0 1  PHQ - 2 Score 6 0 0 0 1  Altered sleeping 2 - - - -  Tired, decreased energy 3 - - - -  Change in appetite 2 - - - -  Feeling bad or failure about yourself  2 - - - -  Trouble concentrating 3 - - - -  Moving slowly or fidgety/restless 2 - - - -  Suicidal thoughts 0 - - - -  PHQ-9 Score 20 - - - -  Difficult doing work/chores Somewhat difficult - - - -     ALLERGIES: Allergies  Allergen Reactions  . Penicillins Anaphylaxis  . Food     Fish, Walnuts, Cashews, Almonds, Pecans, Blueberries, Pine nuts, Pistachios, Macadamia Nuts, Coconuts, Chestnuts    MEDICATIONS: Current Outpatient Medications on File Prior to Visit  Medication Sig Dispense Refill  . lisinopril-hydrochlorothiazide (PRINZIDE,ZESTORETIC) 10-12.5 MG tablet Take 1 tablet by mouth daily. 90 tablet 3  . meloxicam (MOBIC) 15 MG tablet Take 1 tablet (15 mg total) by mouth daily as needed for pain. 30 tablet 5  . Multiple Vitamin (MULTIVITAMINS PO) Take by mouth.     No current facility-administered  medications on file prior to visit.     PAST MEDICAL HISTORY: Past Medical History:  Diagnosis Date  . Allergy   . Anemia   . Arthritis   . Asthma   . Fibroids   . Hypertension   . Iron deficiency anemia due to chronic blood loss   . Obesity (BMI 30-39.9) 02/02/2012  . Sickle cell trait ([place]) 06/04/2014   hemoglobin electrophoresis confirmed    PAST SURGICAL HISTORY: Past Surgical History:  Procedure Laterality Date  . tonsillecomy    . TONSILLECTOMY  1997    SOCIAL HISTORY: Social History   Tobacco Use  . Smoking status: Former Smoker    Years: 2.00    Types:  Cigarettes    Last attempt to quit: 07/11/2003    Years since quitting: 14.7  . Smokeless tobacco: Never Used  Substance Use Topics  . Alcohol use: No    Alcohol/week: 0.0 oz  . Drug use: No    FAMILY HISTORY: Family History  Problem Relation Age of Onset  . Hypertension Mother   . Anxiety disorder Mother   . Obesity Mother   . Kidney disease Brother   . Hypertension Maternal Grandmother   . Cancer Maternal Grandmother        bladder  . Hyperlipidemia Father   . Cancer Paternal Grandfather        prostate  . Breast cancer Neg Hx     ROS: Review of Systems  Constitutional: Positive for malaise/fatigue and weight loss.  Gastrointestinal: Positive for constipation. Negative for abdominal pain, melena, nausea and vomiting.       Negative for hematochezia  Genitourinary: Negative for frequency.  Musculoskeletal:       Negative for muscle weakness  Endo/Heme/Allergies: Negative for polydipsia.       Positive for polyphagia Negative for hypoglycemia  Psychiatric/Behavioral: Positive for depression. Negative for suicidal ideas.    PHYSICAL EXAM: Blood pressure 115/73, pulse 83, temperature 98.2 F (36.8 C), temperature source Oral, height 5\' 2"  (1.575 m), weight 231 lb (104.8 kg), last menstrual period 03/29/2018, SpO2 99 %. Body mass index is 42.25 kg/m. Physical Exam  Constitutional: She is oriented to person, place, and time. She appears well-developed and well-nourished.  Cardiovascular: Normal rate.  Pulmonary/Chest: Effort normal.  Musculoskeletal: Normal range of motion.  Neurological: She is oriented to person, place, and time.  Skin: Skin is warm and dry.  Psychiatric: Her behavior is normal.  Vitals reviewed.   RECENT LABS AND TESTS: BMET    Component Value Date/Time   NA 138 12/21/2017 0959   K 4.0 12/21/2017 0959   CL 100 12/21/2017 0959   CO2 22 12/21/2017 0959   GLUCOSE 86 12/21/2017 0959   GLUCOSE 82 07/24/2016 1238   BUN 10 12/21/2017 0959    CREATININE 0.73 12/21/2017 0959   CREATININE 0.80 07/24/2016 1238   CALCIUM 9.1 12/21/2017 0959   GFRNONAA 103 12/21/2017 0959   GFRAA 118 12/21/2017 0959   Lab Results  Component Value Date   HGBA1C 5.7 (H) 12/21/2017   HGBA1C 5.2 08/04/2017   HGBA1C 5.3 07/24/2016   HGBA1C 5.6 07/31/2015   Lab Results  Component Value Date   INSULIN 23.4 12/21/2017   CBC    Component Value Date/Time   WBC 10.1 12/21/2017 0959   WBC 11.5 (H) 07/24/2016 1238   RBC 5.00 12/21/2017 0959   RBC 5.42 (H) 07/24/2016 1238   HGB 10.8 (L) 12/21/2017 0959   HCT 33.5 (L) 12/21/2017 9892  PLT 443 (H) 08/04/2017 1006   MCV 67 (L) 12/21/2017 0959   MCH 21.6 (L) 12/21/2017 0959   MCH 23.1 (L) 07/24/2016 1238   MCHC 32.2 12/21/2017 0959   MCHC 33.6 07/24/2016 1238   RDW 15.7 (H) 12/21/2017 0959   LYMPHSABS 2.2 12/21/2017 0959   MONOABS 0.5 06/04/2014 0828   EOSABS 0.2 12/21/2017 0959   BASOSABS 0.0 12/21/2017 0959   Iron/TIBC/Ferritin/ %Sat    Component Value Date/Time   FERRITIN 19 08/04/2017 1006   Lipid Panel     Component Value Date/Time   CHOL 161 12/21/2017 0959   TRIG 90 12/21/2017 0959   HDL 43 12/21/2017 0959   CHOLHDL 3.8 08/04/2017 1006   CHOLHDL 3.6 07/24/2016 1238   VLDL 27 07/24/2016 1238   LDLCALC 100 (H) 12/21/2017 0959   Hepatic Function Panel     Component Value Date/Time   PROT 6.6 12/21/2017 0959   ALBUMIN 4.1 12/21/2017 0959   AST 16 12/21/2017 0959   ALT 13 12/21/2017 0959   ALKPHOS 68 12/21/2017 0959   BILITOT 0.2 12/21/2017 0959      Component Value Date/Time   TSH 1.270 12/21/2017 0959   TSH 1.140 08/04/2017 1006   TSH 0.96 07/24/2016 1238   Results for ALEXARAE, OLIVA (MRN [id number]) as of 04/21/2018 08:51  Ref. Range 12/21/2017 09:59  Vitamin D, 25-Hydroxy Latest Ref Range: 30.0 - 100.0 ng/mL 45.5   ASSESSMENT AND PLAN: Other iron deficiency anemia - Plan: ferrous sulfate 325 (65 FE) MG tablet  Other constipation - Plan: polyethylene glycol powder  (GLYCOLAX/MIRALAX) powder  Vitamin D deficiency - Plan: Vitamin D, Ergocalciferol, (DRISDOL) 50000 units CAPS capsule  Prediabetes - Plan: metFORMIN (GLUCOPHAGE) 500 MG tablet  Other depression - with emotional eating  At risk for diabetes mellitus  Class 3 severe obesity with serious comorbidity and body mass index (BMI) of 40.0 to 44.9 in adult, unspecified obesity type ([place])  PLAN:  Vitamin D Deficiency Heidi Robinson was informed that low vitamin D levels contributes to fatigue and are associated with obesity, breast, and colon cancer. She agrees to continue to take prescription Vit D @50 ,000 IU every week #4 with no refills. We will recheck labs in 2 to 3 weeks and she will follow up for routine testing of vitamin D, at least 2-3 times per year. She was informed of the risk of over-replacement of vitamin D and agrees to not increase her dose unless she discusses this with [place] first. [place] agrees to follow up as directed.  Pre-Diabetes Heidi Robinson will continue to work on weight loss, exercise, and decreasing simple carbohydrates in her diet to help decrease the risk of diabetes. We dicussed metformin including benefits and risks. She was informed that eating too many simple carbohydrates or too many calories at one sitting increases the likelihood of GI side effects. Heidi Robinson requested metformin for now and a prescription was written today for 1 month refill. Heidi Robinson agreed to follow up with [place] as directed to monitor her progress. We will recheck labs in 2 to 3 weeks.  Diabetes risk counseling Heidi Robinson was given extended (15 minutes) diabetes prevention counseling today. She is 42 y.o. female and has risk factors for diabetes including obesity and pre-diabetes. We discussed intensive lifestyle modifications today with an emphasis on weight loss as well as increasing exercise and decreasing simple carbohydrates in her diet.  Iron Deficiency Anemia The diagnosis of Iron deficiency anemia was discussed with Heidi Robinson and was  explained in detail. She was given  suggestions of iron rich foods. Heidi Robinson agreed to continue FeSO4 325 mg qd #30 with no refills and follow up as directed.   Constipation Heidi Robinson was informed decrease bowel movement frequency is normal while losing weight, but stools should not be hard or painful. She was advised to increase her H20 intake and work on increasing her fiber intake. High fiber foods were discussed today. Heidi Robinson agreed to continue Miralax 17 g by mouth daily and increase H2O intake. Heidi Robinson agrees to follow up as directed.  Depression with Emotional Eating Behaviors We discussed behavior modification techniques today to help Heidi Robinson deal with her emotional eating and depression. We will refer to Dr. Mallie Mussel for evaluation and therapy. Heidi Robinson agreed to follow up as directed.  Obesity Heidi Robinson is currently in the action stage of change. As such, her goal is to continue with weight loss efforts She has agreed to follow the Category 2 plan Heidi Robinson has been instructed to work up to a goal of 150 minutes of combined cardio and strengthening exercise per week for weight loss and overall health benefits. We discussed the following Behavioral Modification Strategies today: increase H2O intake, increasing lean protein intake and emotional eating strategies  Heidi Robinson has agreed to follow up with our clinic in 2 to 3 weeks fasting. She was informed of the importance of frequent follow up visits to maximize her success with intensive lifestyle modifications for her multiple health conditions.   OBESITY BEHAVIORAL INTERVENTION VISIT  Today's visit was # 8 out of 22.  Starting weight: 258 lbs Starting date: 12/21/17 Today's weight : 231 lbs Today's date: 04/20/2018 Total lbs lost to date: 38 (Patients must lose 7 lbs in the first 6 months to continue with counseling)   ASK: We discussed the diagnosis of obesity with Cristie Hem today and [place] agreed to give [place] permission to discuss obesity behavioral modification  therapy today.  ASSESS: Charlie has the diagnosis of obesity and her BMI today is 42.24 Anorah is in the action stage of change   ADVISE: Natayah was educated on the multiple health risks of obesity as well as the benefit of weight loss to improve her health. She was advised of the need for long term treatment and the importance of lifestyle modifications.  AGREE: Multiple dietary modification options and treatment options were discussed and  Jamee agreed to the above obesity treatment plan.  I, Doreene Nest, am acting as transcriptionist for Dennard Nip, MD  I have reviewed the above documentation for accuracy and completeness, and I agree with the above. -Dennard Nip, MD

## 2018-05-11 ENCOUNTER — Ambulatory Visit (INDEPENDENT_AMBULATORY_CARE_PROVIDER_SITE_OTHER): Payer: 59 | Admitting: Family Medicine

## 2018-05-11 VITALS — BP 111/77 | HR 84 | Temp 97.7°F | Ht 62.0 in | Wt 233.0 lb

## 2018-05-11 DIAGNOSIS — E559 Vitamin D deficiency, unspecified: Secondary | ICD-10-CM

## 2018-05-11 DIAGNOSIS — R7303 Prediabetes: Secondary | ICD-10-CM | POA: Diagnosis not present

## 2018-05-11 DIAGNOSIS — D508 Other iron deficiency anemias: Secondary | ICD-10-CM | POA: Diagnosis not present

## 2018-05-11 DIAGNOSIS — Z9189 Other specified personal risk factors, not elsewhere classified: Secondary | ICD-10-CM

## 2018-05-11 DIAGNOSIS — Z6841 Body Mass Index (BMI) 40.0 and over, adult: Secondary | ICD-10-CM

## 2018-05-11 MED ORDER — METFORMIN HCL 500 MG PO TABS: 500.0000 mg | ORAL_TABLET | Freq: Two times a day (BID) | ORAL | 0 refills | Status: DC

## 2018-05-11 MED ORDER — VITAMIN D (ERGOCALCIFEROL) 1.25 MG (50000 UNIT) PO CAPS: 50000.0000 [IU] | ORAL_CAPSULE | ORAL | 0 refills | Status: DC

## 2018-05-11 MED ORDER — FERROUS SULFATE 325 (65 FE) MG PO TABS: 325.0000 mg | ORAL_TABLET | Freq: Every day | ORAL | 0 refills | Status: DC

## 2018-05-11 NOTE — Progress Notes (Signed)
Office: [phone]  /  Fax: [phone]   HPI:   Chief Complaint: OBESITY Heidi Robinson is here to discuss her progress with her obesity treatment plan. She is on the Category 2 plan and is following her eating plan approximately 25 % of the time. She states she is exercising 0 minutes 0 times per week. Heidi Robinson off track over last few weeks since son is out of school and her routine has changed. She is doing more grab and go eating and not meal planning as well.  Her weight is 233 lb (105.7 kg) today and has gained 2 pounds since her last visit. She has lost 25 lbs since starting treatment with [place].  Iron Deficiency Anemia Heidi Robinson has a diagnosis of anemia. She is on iron rich diet and iron supplementation. She denies nausea, vomiting, or constipation. She is due for labs and still notes fatigue.   Pre-Diabetes Heidi Robinson has a diagnosis of pre-diabetes based on her elevated Hgb A1c and was informed this puts her at greater risk of developing diabetes. She is attempting to improve with diet and metformin, and she is due for labs. She denies nausea or hypoglycemia.  At risk for diabetes Heidi Robinson is at higher than average risk for developing diabetes due to her obesity and pre-diabetes. She currently denies polyuria or polydipsia.  Vitamin D Deficiency Heidi Robinson has a diagnosis of vitamin D deficiency. She is on Vit D prescription and denies nausea, vomiting or muscle weakness. She is due for labs.  ALLERGIES: Allergies  Allergen Reactions  . Penicillins Anaphylaxis  . Food     Fish, Walnuts, Cashews, Almonds, Pecans, Blueberries, Pine nuts, Pistachios, Macadamia Nuts, Coconuts, Chestnuts    MEDICATIONS: Current Outpatient Medications on File Prior to Visit  Medication Sig Dispense Refill  . lisinopril-hydrochlorothiazide (PRINZIDE,ZESTORETIC) 10-12.5 MG tablet Take 1 tablet by mouth daily. 90 tablet 3  . meloxicam (MOBIC) 15 MG tablet Take 1 tablet (15 mg total) by mouth daily as needed for pain. 30 tablet  5  . Multiple Vitamin (MULTIVITAMINS PO) Take by mouth.    . polyethylene glycol powder (GLYCOLAX/MIRALAX) powder Take 17 g by mouth daily. 3350 g 0   No current facility-administered medications on file prior to visit.     PAST MEDICAL HISTORY: Past Medical History:  Diagnosis Date  . Allergy   . Anemia   . Arthritis   . Asthma   . Fibroids   . Hypertension   . Iron deficiency anemia due to chronic blood loss   . Obesity (BMI 30-39.9) 02/02/2012  . Sickle cell trait ([place]) 06/04/2014   hemoglobin electrophoresis confirmed    PAST SURGICAL HISTORY: Past Surgical History:  Procedure Laterality Date  . tonsillecomy    . TONSILLECTOMY  1997    SOCIAL HISTORY: Social History   Tobacco Use  . Smoking status: Former Smoker    Years: 2.00    Types: Cigarettes    Last attempt to quit: 07/11/2003    Years since quitting: 14.8  . Smokeless tobacco: Never Used  Substance Use Topics  . Alcohol use: No    Alcohol/week: 0.0 oz  . Drug use: No    FAMILY HISTORY: Family History  Problem Relation Age of Onset  . Hypertension Mother   . Anxiety disorder Mother   . Obesity Mother   . Kidney disease Brother   . Hypertension Maternal Grandmother   . Cancer Maternal Grandmother        bladder  . Hyperlipidemia Father   . Cancer  Paternal Grandfather        prostate  . Breast cancer Neg Hx     ROS: Review of Systems  Constitutional: Positive for malaise/fatigue. Negative for weight loss.  Gastrointestinal: Negative for constipation, nausea and vomiting.  Genitourinary: Negative for frequency.  Musculoskeletal:       Negative muscle weakness  Endo/Heme/Allergies: Negative for polydipsia.       Negative hypoglycemia    PHYSICAL EXAM: Blood pressure 111/77, pulse 84, temperature 97.7 F (36.5 C), temperature source Oral, height 5\' 2"  (1.575 m), weight 233 lb (105.7 kg), SpO2 100 %. Body mass index is 42.62 kg/m. Physical Exam  Constitutional: She is oriented to person,  place, and time. She appears well-developed and well-nourished.  Cardiovascular: Normal rate.  Pulmonary/Chest: Effort normal.  Musculoskeletal: Normal range of motion.  Neurological: She is oriented to person, place, and time.  Skin: Skin is warm and dry.  Psychiatric: She has a normal mood and affect. Her behavior is normal.  Vitals reviewed.   RECENT LABS AND TESTS: BMET    Component Value Date/Time   NA 138 12/21/2017 0959   K 4.0 12/21/2017 0959   CL 100 12/21/2017 0959   CO2 22 12/21/2017 0959   GLUCOSE 86 12/21/2017 0959   GLUCOSE 82 07/24/2016 1238   BUN 10 12/21/2017 0959   CREATININE 0.73 12/21/2017 0959   CREATININE 0.80 07/24/2016 1238   CALCIUM 9.1 12/21/2017 0959   GFRNONAA 103 12/21/2017 0959   GFRAA 118 12/21/2017 0959   Lab Results  Component Value Date   HGBA1C 5.7 (H) 12/21/2017   HGBA1C 5.2 08/04/2017   HGBA1C 5.3 07/24/2016   HGBA1C 5.6 07/31/2015   Lab Results  Component Value Date   INSULIN 23.4 12/21/2017   CBC    Component Value Date/Time   WBC 10.1 12/21/2017 0959   WBC 11.5 (H) 07/24/2016 1238   RBC 5.00 12/21/2017 0959   RBC 5.42 (H) 07/24/2016 1238   HGB 10.8 (L) 12/21/2017 0959   HCT 33.5 (L) 12/21/2017 0959   PLT 443 (H) 08/04/2017 1006   MCV 67 (L) 12/21/2017 0959   MCH 21.6 (L) 12/21/2017 0959   MCH 23.1 (L) 07/24/2016 1238   MCHC 32.2 12/21/2017 0959   MCHC 33.6 07/24/2016 1238   RDW 15.7 (H) 12/21/2017 0959   LYMPHSABS 2.2 12/21/2017 0959   MONOABS 0.5 06/04/2014 0828   EOSABS 0.2 12/21/2017 0959   BASOSABS 0.0 12/21/2017 0959   Iron/TIBC/Ferritin/ %Sat    Component Value Date/Time   FERRITIN 19 08/04/2017 1006   Lipid Panel     Component Value Date/Time   CHOL 161 12/21/2017 0959   TRIG 90 12/21/2017 0959   HDL 43 12/21/2017 0959   CHOLHDL 3.8 08/04/2017 1006   CHOLHDL 3.6 07/24/2016 1238   VLDL 27 07/24/2016 1238   LDLCALC 100 (H) 12/21/2017 0959   Hepatic Function Panel     Component Value Date/Time    PROT 6.6 12/21/2017 0959   ALBUMIN 4.1 12/21/2017 0959   AST 16 12/21/2017 0959   ALT 13 12/21/2017 0959   ALKPHOS 68 12/21/2017 0959   BILITOT 0.2 12/21/2017 0959      Component Value Date/Time   TSH 1.270 12/21/2017 0959   TSH 1.140 08/04/2017 1006   TSH 0.96 07/24/2016 1238  Results for WHITLEY, PATCHEN (MRN [id number]) as of 05/11/2018 09:57  Ref. Range 12/21/2017 09:59  Vitamin D, 25-Hydroxy Latest Ref Range: 30.0 - 100.0 ng/mL 45.5    ASSESSMENT AND PLAN: Other  iron deficiency anemia - Plan: CBC With Differential, ferrous sulfate 325 (65 FE) MG tablet  Prediabetes - Plan: Comprehensive metabolic panel, Hemoglobin A1c, Insulin, random, Lipid Panel With LDL/HDL Ratio, metFORMIN (GLUCOPHAGE) 500 MG tablet  Vitamin D deficiency - Plan: VITAMIN D 25 Hydroxy (Vit-D Deficiency, Fractures), Vitamin D, Ergocalciferol, (DRISDOL) 50000 units CAPS capsule  At risk for diabetes mellitus  Class 3 severe obesity with serious comorbidity and body mass index (BMI) of 40.0 to 44.9 in adult, unspecified obesity type ([facility])  PLAN:  Iron Deficiency Anemia The diagnosis of Iron deficiency anemia was discussed with Heidi Robinson and was explained in detail. She was given suggestions of iron rich foods. Heidi Robinson agrees to continue taking FeSoy 325 mg q AM #30 and we will refill for 1 month. We will check labs and Heidi Robinson agrees to follow up with our clinic in 3 weeks.  Pre-Diabetes Heidi Robinson will continue to work on weight loss, exercise, and decreasing simple carbohydrates in her diet to help decrease the risk of diabetes. We dicussed metformin including benefits and risks. She was informed that eating too many simple carbohydrates or too many calories at one sitting increases the likelihood of GI side effects. Heidi Robinson agrees to continue taking metformin 500 mg BID #60 and we will refill for 1 month. We will check labs and Heidi Robinson agrees to follow up with our clinic in 3 weeks as directed to monitor her progress.  Diabetes risk  counselling Heidi Robinson was given extended (15 minutes) diabetes prevention counseling today. She is 42 y.o. female and has risk factors for diabetes including obesity and pre-diabetes. We discussed intensive lifestyle modifications today with an emphasis on weight loss as well as increasing exercise and decreasing simple carbohydrates in her diet.  Vitamin D Deficiency Heidi Robinson was informed that low vitamin D levels contributes to fatigue and are associated with obesity, breast, and colon cancer. Heidi Robinson agrees to continue taking prescription Vit D @50 ,000 IU every week #4 and we will refill for 1 month. She will follow up for routine testing of vitamin D, at least 2-3 times per year. She was informed of the risk of over-replacement of vitamin D and agrees to not increase her dose unless she discusses this with [place] first. We will check labs and Heidi Robinson agrees to follow up with our clinic in 3 weeks.  Obesity Heidi Robinson is currently in the action stage of change. As such, her goal is to continue with weight loss efforts She has agreed to follow the Category 2 plan Heidi Robinson has been instructed to work up to a goal of 150 minutes of combined cardio and strengthening exercise per week for weight loss and overall health benefits. We discussed the following Behavioral Modification Strategies today: increasing lean protein intake, decreasing simple carbohydrates, decrease eating out and work on meal planning and easy cooking plans We discussed [employer] free grocery shopping app to help with meal prep.  Heidi Robinson has agreed to follow up with our clinic in 3 weeks. She was informed of the importance of frequent follow up visits to maximize her success with intensive lifestyle modifications for her multiple health conditions.   OBESITY BEHAVIORAL INTERVENTION VISIT  Today's visit was # 9 out of 22.  Starting weight: 258 lbs Starting date: 12/21/17 Today's weight : 233 lbs  Today's date: 05/11/2018 Total lbs lost to date: 25 (Patients  must lose 7 lbs in the first 6 months to continue with counseling)   ASK: We discussed the diagnosis of obesity with Heidi Robinson today  and Heidi Robinson agreed to give [place] permission to discuss obesity behavioral modification therapy today.  ASSESS: Heidi Robinson has the diagnosis of obesity and her BMI today is 42.61 Heidi Robinson is in the action stage of change   ADVISE: Heidi Robinson was educated on the multiple health risks of obesity as well as the benefit of weight loss to improve her health. She was advised of the need for long term treatment and the importance of lifestyle modifications.  AGREE: Multiple dietary modification options and treatment options were discussed and  Heidi Robinson agreed to the above obesity treatment plan.  I, Trixie Dredge, am acting as transcriptionist for Dennard Nip, MD  I have reviewed the above documentation for accuracy and completeness, and I agree with the above. -Dennard Nip, MD

## 2018-05-12 LAB — COMPREHENSIVE METABOLIC PANEL
ALK PHOS: 63 IU/L (ref 39–117)
ALT: 8 IU/L (ref 0–32)
AST: 12 IU/L (ref 0–40)
Albumin/Globulin Ratio: 1.9 (ref 1.2–2.2)
Albumin: 4.4 g/dL (ref 3.5–5.5)
BUN/Creatinine Ratio: 17 (ref 9–23)
BUN: 13 mg/dL (ref 6–24)
Bilirubin Total: <0.2 mg/dL (ref 0.0–1.2)
CHLORIDE: 102 mmol/L (ref 96–106)
CO2: 22 mmol/L (ref 20–29)
CREATININE: 0.78 mg/dL (ref 0.57–1.00)
Calcium: 9.7 mg/dL (ref 8.7–10.2)
GFR calc Af Amer: 109 mL/min/{1.73_m2} (ref >59)
GFR calc non Af Amer: 95 mL/min/{1.73_m2} (ref >59)
GLOBULIN, TOTAL: 2.3 g/dL (ref 1.5–4.5)
GLUCOSE: 87 mg/dL (ref 65–99)
Potassium: 4.3 mmol/L (ref 3.5–5.2)
SODIUM: 139 mmol/L (ref 134–144)
Total Protein: 6.7 g/dL (ref 6.0–8.5)

## 2018-05-12 LAB — CBC WITH DIFFERENTIAL
BASOS: 0 %
Basophils Absolute: 0 10*3/uL (ref 0.0–0.2)
EOS (ABSOLUTE): 0.3 10*3/uL (ref 0.0–0.4)
Eos: 3 %
Hematocrit: 34.5 % (ref 34.0–46.6)
Hemoglobin: 10.7 g/dL — ABNORMAL LOW (ref 11.1–15.9)
IMMATURE GRANS (ABS): 0.1 10*3/uL (ref 0.0–0.1)
Immature Granulocytes: 1 %
LYMPHS ABS: 2 10*3/uL (ref 0.7–3.1)
LYMPHS: 21 %
MCH: 21.4 pg — AB (ref 26.6–33.0)
MCHC: 31 g/dL — ABNORMAL LOW (ref 31.5–35.7)
MCV: 69 fL — ABNORMAL LOW (ref 79–97)
MONOS ABS: 0.6 10*3/uL (ref 0.1–0.9)
Monocytes: 6 %
NEUTROS PCT: 69 %
Neutrophils Absolute: 6.9 10*3/uL (ref 1.4–7.0)
RBC: 5 x10E6/uL (ref 3.77–5.28)
RDW: 16.8 % — AB (ref 12.3–15.4)
WBC: 9.9 10*3/uL (ref 3.4–10.8)

## 2018-05-12 LAB — LIPID PANEL WITH LDL/HDL RATIO
Cholesterol, Total: 173 mg/dL (ref 100–199)
HDL: 44 mg/dL (ref >39)
LDL CALC: 103 mg/dL — AB (ref 0–99)
LDL/HDL RATIO: 2.3 ratio (ref 0.0–3.2)
TRIGLYCERIDES: 128 mg/dL (ref 0–149)
VLDL Cholesterol Cal: 26 mg/dL (ref 5–40)

## 2018-05-12 LAB — HEMOGLOBIN A1C
Est. average glucose Bld gHb Est-mCnc: 103 mg/dL
Hgb A1c MFr Bld: 5.2 % (ref 4.8–5.6)

## 2018-05-12 LAB — VITAMIN D 25 HYDROXY (VIT D DEFICIENCY, FRACTURES): VIT D 25 HYDROXY: 46.7 ng/mL (ref 30.0–100.0)

## 2018-05-12 LAB — INSULIN, RANDOM: INSULIN: 16.7 u[IU]/mL (ref 2.6–24.9)

## 2018-06-01 ENCOUNTER — Ambulatory Visit (INDEPENDENT_AMBULATORY_CARE_PROVIDER_SITE_OTHER): Payer: 59 | Admitting: Family Medicine

## 2018-06-01 VITALS — BP 113/76 | HR 99 | Temp 98.2°F | Ht 62.0 in | Wt 230.0 lb

## 2018-06-01 DIAGNOSIS — Z6841 Body Mass Index (BMI) 40.0 and over, adult: Secondary | ICD-10-CM

## 2018-06-01 DIAGNOSIS — R7303 Prediabetes: Secondary | ICD-10-CM | POA: Diagnosis not present

## 2018-06-02 NOTE — Progress Notes (Signed)
Office: [phone]  /  Fax: [phone]   HPI:   Chief Complaint: OBESITY Heidi Robinson is here to discuss her progress with her obesity treatment plan. She is on the Category 2 plan and is following her eating plan approximately 50 % of the time. She states she is exercising 0 minutes 0 times per week. Heidi Robinson did very well with weight loss. She reports drinking more water and is eating more protein and vegetables. Her weight is 230 lb (104.3 kg) today and has had a weight loss of 3 pounds over a period of 3 weeks since her last visit. She has lost 28 lbs since starting treatment with [place].  Pre-Diabetes Heidi Robinson has a diagnosis of prediabetes based on her elevated Hgb A1c and was informed this puts her at greater risk of developing diabetes. She is taking metformin currently and continues to work on diet and exercise to decrease risk of diabetes. Her last A1c was at goal. She denies nausea, vomiting or hypoglycemia.  ALLERGIES: Allergies  Allergen Reactions  . Penicillins Anaphylaxis  . Food     Fish, Walnuts, Cashews, Almonds, Pecans, Blueberries, Pine nuts, Pistachios, Macadamia Nuts, Coconuts, Chestnuts    MEDICATIONS: Current Outpatient Medications on File Prior to Visit  Medication Sig Dispense Refill  . ferrous sulfate 325 (65 FE) MG tablet Take 1 tablet (325 mg total) by mouth daily with breakfast. 30 tablet 0  . lisinopril-hydrochlorothiazide (PRINZIDE,ZESTORETIC) 10-12.5 MG tablet Take 1 tablet by mouth daily. 90 tablet 3  . meloxicam (MOBIC) 15 MG tablet Take 1 tablet (15 mg total) by mouth daily as needed for pain. 30 tablet 5  . metFORMIN (GLUCOPHAGE) 500 MG tablet Take 1 tablet (500 mg total) by mouth 2 (two) times daily with a meal. 60 tablet 0  . Multiple Vitamin (MULTIVITAMINS PO) Take by mouth.    . polyethylene glycol powder (GLYCOLAX/MIRALAX) powder Take 17 g by mouth daily. 3350 g 0  . Vitamin D, Ergocalciferol, (DRISDOL) 50000 units CAPS capsule Take 1 capsule (50,000 Units  total) by mouth every 7 (seven) days. 4 capsule 0   No current facility-administered medications on file prior to visit.     PAST MEDICAL HISTORY: Past Medical History:  Diagnosis Date  . Allergy   . Anemia   . Arthritis   . Asthma   . Fibroids   . Hypertension   . Iron deficiency anemia due to chronic blood loss   . Obesity (BMI 30-39.9) 02/02/2012  . Sickle cell trait ([place]) 06/04/2014   hemoglobin electrophoresis confirmed    PAST SURGICAL HISTORY: Past Surgical History:  Procedure Laterality Date  . tonsillecomy    . TONSILLECTOMY  1997    SOCIAL HISTORY: Social History   Tobacco Use  . Smoking status: Former Smoker    Years: 2.00    Types: Cigarettes    Last attempt to quit: 07/11/2003    Years since quitting: 14.9  . Smokeless tobacco: Never Used  Substance Use Topics  . Alcohol use: No    Alcohol/week: 0.0 oz  . Drug use: No    FAMILY HISTORY: Family History  Problem Relation Age of Onset  . Hypertension Mother   . Anxiety disorder Mother   . Obesity Mother   . Kidney disease Brother   . Hypertension Maternal Grandmother   . Cancer Maternal Grandmother        bladder  . Hyperlipidemia Father   . Cancer Paternal Grandfather        prostate  . Breast  cancer Neg Hx     ROS: Review of Systems  Constitutional: Positive for weight loss.  Gastrointestinal: Negative for nausea and vomiting.  Endo/Heme/Allergies:       Negative for hypoglycemia    PHYSICAL EXAM: Blood pressure 113/76, pulse 99, temperature 98.2 F (36.8 C), temperature source Oral, height 5\' 2"  (1.575 m), weight 230 lb (104.3 kg), last menstrual period 05/30/2018, SpO2 98 %. Body mass index is 42.07 kg/m. Physical Exam  Constitutional: She is oriented to person, place, and time. She appears well-developed and well-nourished.  Cardiovascular: Normal rate.  Pulmonary/Chest: Effort normal.  Musculoskeletal: Normal range of motion.  Neurological: She is oriented to person, place, and  time.  Skin: Skin is warm and dry.  Psychiatric: She has a normal mood and affect. Her behavior is normal.  Vitals reviewed.   RECENT LABS AND TESTS: BMET    Component Value Date/Time   NA 139 05/11/2018 0745   K 4.3 05/11/2018 0745   CL 102 05/11/2018 0745   CO2 22 05/11/2018 0745   GLUCOSE 87 05/11/2018 0745   GLUCOSE 82 07/24/2016 1238   BUN 13 05/11/2018 0745   CREATININE 0.78 05/11/2018 0745   CREATININE 0.80 07/24/2016 1238   CALCIUM 9.7 05/11/2018 0745   GFRNONAA 95 05/11/2018 0745   GFRAA 109 05/11/2018 0745   Lab Results  Component Value Date   HGBA1C 5.2 05/11/2018   HGBA1C 5.7 (H) 12/21/2017   HGBA1C 5.2 08/04/2017   HGBA1C 5.3 07/24/2016   HGBA1C 5.6 07/31/2015   Lab Results  Component Value Date   INSULIN 16.7 05/11/2018   INSULIN 23.4 12/21/2017   CBC    Component Value Date/Time   WBC 9.9 05/11/2018 0745   WBC 11.5 (H) 07/24/2016 1238   RBC 5.00 05/11/2018 0745   RBC 5.42 (H) 07/24/2016 1238   HGB 10.7 (L) 05/11/2018 0745   HCT 34.5 05/11/2018 0745   PLT 443 (H) 08/04/2017 1006   MCV 69 (L) 05/11/2018 0745   MCH 21.4 (L) 05/11/2018 0745   MCH 23.1 (L) 07/24/2016 1238   MCHC 31.0 (L) 05/11/2018 0745   MCHC 33.6 07/24/2016 1238   RDW 16.8 (H) 05/11/2018 0745   LYMPHSABS 2.0 05/11/2018 0745   MONOABS 0.5 06/04/2014 0828   EOSABS 0.3 05/11/2018 0745   BASOSABS 0.0 05/11/2018 0745   Iron/TIBC/Ferritin/ %Sat    Component Value Date/Time   FERRITIN 19 08/04/2017 1006   Lipid Panel     Component Value Date/Time   CHOL 173 05/11/2018 0745   TRIG 128 05/11/2018 0745   HDL 44 05/11/2018 0745   CHOLHDL 3.8 08/04/2017 1006   CHOLHDL 3.6 07/24/2016 1238   VLDL 27 07/24/2016 1238   LDLCALC 103 (H) 05/11/2018 0745   Hepatic Function Panel     Component Value Date/Time   PROT 6.7 05/11/2018 0745   ALBUMIN 4.4 05/11/2018 0745   AST 12 05/11/2018 0745   ALT 8 05/11/2018 0745   ALKPHOS 63 05/11/2018 0745   BILITOT <0.2 05/11/2018 0745        Component Value Date/Time   TSH 1.270 12/21/2017 0959   TSH 1.140 08/04/2017 1006   TSH 0.96 07/24/2016 1238   Results for Heidi Robinson, Heidi Robinson (MRN [id number]) as of 06/02/2018 09:59  Ref. Range 05/11/2018 07:45  Vitamin D, 25-Hydroxy Latest Ref Range: 30.0 - 100.0 ng/mL 46.7   ASSESSMENT AND PLAN: Prediabetes  Class 3 severe obesity with serious comorbidity and body mass index (BMI) of 40.0 to 44.9 in adult, unspecified  obesity type [facility])  PLAN:  Pre-Diabetes Heidi Robinson will continue to work on weight loss, exercise, and decreasing simple carbohydrates in her diet to help decrease the risk of diabetes. We dicussed metformin including benefits and risks. She was informed that eating too many simple carbohydrates or too many calories at one sitting increases the likelihood of GI side effects. Heidi Robinson will continue metformin for now and a prescription was not written today. Heidi Robinson agreed to follow up with [place] as directed to monitor her progress.  We spent > than 50% of the 15 minute visit on the counseling as documented in the note.  Obesity Heidi Robinson is currently in the action stage of change. As such, her goal is to continue with weight loss efforts She has agreed to follow the Category 2 plan Heidi Robinson has been instructed to work up to a goal of 150 minutes of combined cardio and strengthening exercise per week for weight loss and overall health benefits. We discussed the following Behavioral Modification Strategies today: planning for success, work on meal planning and easy cooking plans  Heidi Robinson has agreed to follow up with our clinic in 3 weeks. She was informed of the importance of frequent follow up visits to maximize her success with intensive lifestyle modifications for her multiple health conditions.   OBESITY BEHAVIORAL INTERVENTION VISIT  Today's visit was # 10 out of 22.  Starting weight: 258 lbs Starting date: 12/21/17 Today's weight : 230 lbs Today's date: 06/01/2018 Total lbs lost to date:  67    ASK: We discussed the diagnosis of obesity with Heidi Robinson today and Heidi Robinson agreed to give [place] permission to discuss obesity behavioral modification therapy today.  ASSESS: Heidi Robinson has the diagnosis of obesity and her BMI today is 42.06 Heidi Robinson is in the action stage of change   ADVISE: Heidi Robinson was educated on the multiple health risks of obesity as well as the benefit of weight loss to improve her health. She was advised of the need for long term treatment and the importance of lifestyle modifications.  AGREE: Multiple dietary modification options and treatment options were discussed and  Heidi Robinson agreed to the above obesity treatment plan.  I, Doreene Nest, am acting as transcriptionist for Dennard Nip, MD  I have reviewed the above documentation for accuracy and completeness, and I agree with the above. -Dennard Nip, MD

## 2018-06-22 ENCOUNTER — Ambulatory Visit (INDEPENDENT_AMBULATORY_CARE_PROVIDER_SITE_OTHER): Payer: 59 | Admitting: Family Medicine

## 2018-06-22 VITALS — BP 109/76 | HR 83 | Temp 98.2°F | Ht 62.0 in | Wt 235.0 lb

## 2018-06-22 DIAGNOSIS — E559 Vitamin D deficiency, unspecified: Secondary | ICD-10-CM

## 2018-06-22 DIAGNOSIS — D508 Other iron deficiency anemias: Secondary | ICD-10-CM

## 2018-06-22 DIAGNOSIS — F3289 Other specified depressive episodes: Secondary | ICD-10-CM

## 2018-06-22 DIAGNOSIS — R7303 Prediabetes: Secondary | ICD-10-CM

## 2018-06-22 DIAGNOSIS — Z6841 Body Mass Index (BMI) 40.0 and over, adult: Secondary | ICD-10-CM

## 2018-06-22 DIAGNOSIS — Z9189 Other specified personal risk factors, not elsewhere classified: Secondary | ICD-10-CM | POA: Diagnosis not present

## 2018-06-22 MED ORDER — VITAMIN D (ERGOCALCIFEROL) 1.25 MG (50000 UNIT) PO CAPS: 50000.0000 [IU] | ORAL_CAPSULE | ORAL | 0 refills | Status: DC

## 2018-06-22 MED ORDER — FERROUS SULFATE 325 (65 FE) MG PO TABS: 325.0000 mg | ORAL_TABLET | Freq: Every day | ORAL | 0 refills | Status: DC

## 2018-06-22 MED ORDER — BUPROPION HCL ER (SR) 150 MG PO TB12: 150.0000 mg | ORAL_TABLET | Freq: Every day | ORAL | 0 refills | Status: DC

## 2018-06-22 MED ORDER — METFORMIN HCL 500 MG PO TABS: 500.0000 mg | ORAL_TABLET | Freq: Two times a day (BID) | ORAL | 0 refills | Status: DC

## 2018-06-22 NOTE — Progress Notes (Signed)
Office: [phone]  /  Fax: [phone]   HPI:   Chief Complaint: OBESITY Heidi Robinson is here to discuss her progress with her obesity treatment plan. She is on the Category 2 plan and is following her eating plan approximately 30 % of the time. She states she is swimming for 30 minutes 2 times per week. Heidi Robinson has been on vacation and has increased celebration eating. She is frustrated she got off track and she is struggling so much.  Her weight is 235 lb (106.6 kg) today and has gained 5 pounds since her last visit. She has lost 23 lbs since starting treatment with [place].  Pre-Diabetes Heidi Robinson has a diagnosis of pre-diabetes based on her elevated Hgb A1c and was informed this puts her at greater risk of developing diabetes. She is stable on metformin and continues to work on diet and exercise to decrease risk of diabetes. She denies nausea, vomiting, or hypoglycemia.  At risk for diabetes Heidi Robinson is at higher than average risk for developing diabetes due to her obesity and pre-diabetes. She currently denies polyuria or polydipsia.  Iron Deficiency Anemia Heidi Robinson has a diagnosis of anemia. She is doing well taking iron supplementation and increasing iron in diet, constipation is controlled with miralax as needed.  Vitamin D Deficiency Heidi Robinson has a diagnosis of vitamin D deficiency. She is stable on prescription Vit D, not yet at goal. She denies nausea, vomiting or muscle weakness.  Depression with emotional eating behaviors Heidi Robinson notes increased frustration and increased cravings. Heidi Robinson struggles with emotional eating and using food for comfort to the extent that it is negatively impacting her health. She often snacks when she is not hungry. Heidi Robinson sometimes feels she is out of control and then feels guilty that she made poor food choices. She has been working on behavior modification techniques to help reduce her emotional eating and has been somewhat successful. She shows no sign of suicidal or homicidal  ideations.  Depression screen [name] 2/9 12/21/2017 11/06/2017 08/04/2017 07/24/2016 05/04/2016  Decreased Interest 3 0 0 0 0  Down, Depressed, Hopeless 3 0 0 0 1  PHQ - 2 Score 6 0 0 0 1  Altered sleeping 2 - - - -  Tired, decreased energy 3 - - - -  Change in appetite 2 - - - -  Feeling bad or failure about yourself  2 - - - -  Trouble concentrating 3 - - - -  Moving slowly or fidgety/restless 2 - - - -  Suicidal thoughts 0 - - - -  PHQ-9 Score 20 - - - -  Difficult doing work/chores Somewhat difficult - - - -    ALLERGIES: Allergies  Allergen Reactions  . Penicillins Anaphylaxis  . Food     Fish, Walnuts, Cashews, Almonds, Pecans, Blueberries, Pine nuts, Pistachios, Macadamia Nuts, Coconuts, Chestnuts    MEDICATIONS: Current Outpatient Medications on File Prior to Visit  Medication Sig Dispense Refill  . ferrous sulfate 325 (65 FE) MG tablet Take 1 tablet (325 mg total) by mouth daily with breakfast. 30 tablet 0  . lisinopril-hydrochlorothiazide (PRINZIDE,ZESTORETIC) 10-12.5 MG tablet Take 1 tablet by mouth daily. 90 tablet 3  . meloxicam (MOBIC) 15 MG tablet Take 1 tablet (15 mg total) by mouth daily as needed for pain. 30 tablet 5  . metFORMIN (GLUCOPHAGE) 500 MG tablet Take 1 tablet (500 mg total) by mouth 2 (two) times daily with a meal. 60 tablet 0  . Multiple Vitamin (MULTIVITAMINS PO) Take by mouth.    [name]  polyethylene glycol powder (GLYCOLAX/MIRALAX) powder Take 17 g by mouth daily. 3350 g 0  . Vitamin D, Ergocalciferol, (DRISDOL) 50000 units CAPS capsule Take 1 capsule (50,000 Units total) by mouth every 7 (seven) days. 4 capsule 0   No current facility-administered medications on file prior to visit.     PAST MEDICAL HISTORY: Past Medical History:  Diagnosis Date  . Allergy   . Anemia   . Arthritis   . Asthma   . Fibroids   . Hypertension   . Iron deficiency anemia due to chronic blood loss   . Obesity (BMI 30-39.9) 02/02/2012  . Sickle cell trait ([place]) 06/04/2014    hemoglobin electrophoresis confirmed    PAST SURGICAL HISTORY: Past Surgical History:  Procedure Laterality Date  . tonsillecomy    . TONSILLECTOMY  1997    SOCIAL HISTORY: Social History   Tobacco Use  . Smoking status: Former Smoker    Years: 2.00    Types: Cigarettes    Last attempt to quit: 07/11/2003    Years since quitting: 14.9  . Smokeless tobacco: Never Used  Substance Use Topics  . Alcohol use: No    Alcohol/week: 0.0 standard drinks  . Drug use: No    FAMILY HISTORY: Family History  Problem Relation Age of Onset  . Hypertension Mother   . Anxiety disorder Mother   . Obesity Mother   . Kidney disease Brother   . Hypertension Maternal Grandmother   . Cancer Maternal Grandmother        bladder  . Hyperlipidemia Father   . Cancer Paternal Grandfather        prostate  . Breast cancer Neg Hx     ROS: Review of Systems  Constitutional: Negative for weight loss.  Gastrointestinal: Negative for nausea and vomiting.  Genitourinary: Negative for frequency.  Musculoskeletal:       Negative muscle weakness  Endo/Heme/Allergies: Negative for polydipsia.       Negative hypoglycemia  Psychiatric/Behavioral: Positive for depression. Negative for suicidal ideas.    PHYSICAL EXAM: Blood pressure 109/76, pulse 83, temperature 98.2 F (36.8 C), temperature source Oral, height 5\' 2"  (1.575 m), weight 235 lb (106.6 kg), last menstrual period 05/30/2018, SpO2 100 %. Body mass index is 42.98 kg/m. Physical Exam  Constitutional: She is oriented to person, place, and time. She appears well-developed and well-nourished.  Cardiovascular: Normal rate.  Pulmonary/Chest: Effort normal.  Musculoskeletal: Normal range of motion.  Neurological: She is oriented to person, place, and time.  Skin: Skin is warm and dry.  Psychiatric: She has a normal mood and affect. Her behavior is normal.  Vitals reviewed.   RECENT LABS AND TESTS: BMET    Component Value Date/Time    NA 139 05/11/2018 0745   K 4.3 05/11/2018 0745   CL 102 05/11/2018 0745   CO2 22 05/11/2018 0745   GLUCOSE 87 05/11/2018 0745   GLUCOSE 82 07/24/2016 1238   BUN 13 05/11/2018 0745   CREATININE 0.78 05/11/2018 0745   CREATININE 0.80 07/24/2016 1238   CALCIUM 9.7 05/11/2018 0745   GFRNONAA 95 05/11/2018 0745   GFRAA 109 05/11/2018 0745   Lab Results  Component Value Date   HGBA1C 5.2 05/11/2018   HGBA1C 5.7 (H) 12/21/2017   HGBA1C 5.2 08/04/2017   HGBA1C 5.3 07/24/2016   HGBA1C 5.6 07/31/2015   Lab Results  Component Value Date   INSULIN 16.7 05/11/2018   INSULIN 23.4 12/21/2017   CBC    Component Value Date/Time  WBC 9.9 05/11/2018 0745   WBC 11.5 (H) 07/24/2016 1238   RBC 5.00 05/11/2018 0745   RBC 5.42 (H) 07/24/2016 1238   HGB 10.7 (L) 05/11/2018 0745   HCT 34.5 05/11/2018 0745   PLT 443 (H) 08/04/2017 1006   MCV 69 (L) 05/11/2018 0745   MCH 21.4 (L) 05/11/2018 0745   MCH 23.1 (L) 07/24/2016 1238   MCHC 31.0 (L) 05/11/2018 0745   MCHC 33.6 07/24/2016 1238   RDW 16.8 (H) 05/11/2018 0745   LYMPHSABS 2.0 05/11/2018 0745   MONOABS 0.5 06/04/2014 0828   EOSABS 0.3 05/11/2018 0745   BASOSABS 0.0 05/11/2018 0745   Iron/TIBC/Ferritin/ %Sat    Component Value Date/Time   FERRITIN 19 08/04/2017 1006   Lipid Panel     Component Value Date/Time   CHOL 173 05/11/2018 0745   TRIG 128 05/11/2018 0745   HDL 44 05/11/2018 0745   CHOLHDL 3.8 08/04/2017 1006   CHOLHDL 3.6 07/24/2016 1238   VLDL 27 07/24/2016 1238   LDLCALC 103 (H) 05/11/2018 0745   Hepatic Function Panel     Component Value Date/Time   PROT 6.7 05/11/2018 0745   ALBUMIN 4.4 05/11/2018 0745   AST 12 05/11/2018 0745   ALT 8 05/11/2018 0745   ALKPHOS 63 05/11/2018 0745   BILITOT <0.2 05/11/2018 0745      Component Value Date/Time   TSH 1.270 12/21/2017 0959   TSH 1.140 08/04/2017 1006   TSH 0.96 07/24/2016 1238  Results for KHYLI, SWAIM (MRN [id number]) as of 06/22/2018 17:55  Ref. Range  05/11/2018 07:45  Vitamin D, 25-Hydroxy Latest Ref Range: 30.0 - 100.0 ng/mL 46.7    ASSESSMENT AND PLAN: No diagnosis found.  PLAN:  Pre-Diabetes Accalia will continue to work on weight loss, exercise, and decreasing simple carbohydrates in her diet to help decrease the risk of diabetes. We dicussed metformin including benefits and risks. She was informed that eating too many simple carbohydrates or too many calories at one sitting increases the likelihood of GI side effects. Pang agrees to continue taking metformin 500 mg BID #60 and we will refill for 1 month. Jordanna agrees to follow up with our clinic in 2 to 3 weeks as directed to monitor her progress.  Diabetes risk counselling Sharisse was given extended (15 minutes) diabetes prevention counseling today. She is 42 y.o. female and has risk factors for diabetes including obesity and pre-diabetes. We discussed intensive lifestyle modifications today with an emphasis on weight loss as well as increasing exercise and decreasing simple carbohydrates in her diet.  Iron Deficiency Anemia The diagnosis of Iron deficiency anemia was discussed with Sherece and was explained in detail. She was given suggestions of iron rich foods. Deriana agrees to continue taking FeSoy 325 mg q AM #30 and we will refill for 1 month. Davelyn agrees to follow up with our clinic in 2 to 3 weeks.  Vitamin D Deficiency Niomi was informed that low vitamin D levels contributes to fatigue and are associated with obesity, breast, and colon cancer. [name] agrees to continue taking prescription Vit D @50 ,000 IU every week #4 and we will refill for 1 month. She will follow up for routine testing of vitamin D, at least 2-3 times per year. She was informed of the risk of over-replacement of vitamin D and agrees to not increase her dose unless she discusses this with [place] first. Maila agrees to follow up with our clinic in 2 to 3 weeks.  Depression with Emotional Eating Behaviors  We discussed behavior  modification techniques today to help Evah deal with her emotional eating and depression. Rayvon agrees to start Wellbutrin SR 150 mg q AM #30 with no refills. Demi agrees to follow up with our clinic in 2 to 3 week.   Obesity Shalamar is currently in the action stage of change. As such, her goal is to continue with weight loss efforts She has agreed to follow the Category 2 plan Enslie has been instructed to work up to a goal of 150 minutes of combined cardio and strengthening exercise per week for weight loss and overall health benefits. We discussed the following Behavioral Modification Strategies today: increasing lean protein intake, decreasing simple carbohydrates , work on meal planning and easy cooking plans and emotional eating strategies Eliora was encouraged that the struggles are normal and she is doing fine.  Mackinzie has agreed to follow up with our clinic in 2 to 3 weeks. She was informed of the importance of frequent follow up visits to maximize her success with intensive lifestyle modifications for her multiple health conditions.   OBESITY BEHAVIORAL INTERVENTION VISIT  Today's visit was # 11 out of 22.  Starting weight: 258 lbs Starting date: 12/21/17 Today's weight : 235 lbs  Today's date: 06/22/2018 Total lbs lost to date: 52    ASK: We discussed the diagnosis of obesity with Cristie Hem today and Bina agreed to give [place] permission to discuss obesity behavioral modification therapy today.  ASSESS: Twyla has the diagnosis of obesity and her BMI today is 42.97 Trinitey is in the action stage of change   ADVISE: Katura was educated on the multiple health risks of obesity as well as the benefit of weight loss to improve her health. She was advised of the need for long term treatment and the importance of lifestyle modifications.  AGREE: Multiple dietary modification options and treatment options were discussed and  Genecis agreed to the above obesity treatment plan.  I, Trixie Dredge, am  acting as transcriptionist for Dennard Nip, MD  I have reviewed the above documentation for accuracy and completeness, and I agree with the above. -Dennard Nip, MD

## 2018-07-14 ENCOUNTER — Ambulatory Visit (INDEPENDENT_AMBULATORY_CARE_PROVIDER_SITE_OTHER): Payer: 59 | Admitting: Family Medicine

## 2018-07-14 VITALS — BP 113/78 | HR 92 | Temp 97.9°F | Ht 62.0 in | Wt 229.0 lb

## 2018-07-14 DIAGNOSIS — Z9189 Other specified personal risk factors, not elsewhere classified: Secondary | ICD-10-CM

## 2018-07-14 DIAGNOSIS — R7303 Prediabetes: Secondary | ICD-10-CM

## 2018-07-14 DIAGNOSIS — E559 Vitamin D deficiency, unspecified: Secondary | ICD-10-CM | POA: Diagnosis not present

## 2018-07-14 DIAGNOSIS — F3289 Other specified depressive episodes: Secondary | ICD-10-CM

## 2018-07-14 DIAGNOSIS — D508 Other iron deficiency anemias: Secondary | ICD-10-CM | POA: Diagnosis not present

## 2018-07-14 DIAGNOSIS — Z6841 Body Mass Index (BMI) 40.0 and over, adult: Secondary | ICD-10-CM

## 2018-07-14 MED ORDER — FERROUS SULFATE 325 (65 FE) MG PO TABS: 325.0000 mg | ORAL_TABLET | Freq: Every day | ORAL | 0 refills | Status: DC

## 2018-07-14 MED ORDER — METFORMIN HCL 500 MG PO TABS: 500.0000 mg | ORAL_TABLET | Freq: Two times a day (BID) | ORAL | 0 refills | Status: DC

## 2018-07-14 MED ORDER — BUPROPION HCL ER (SR) 150 MG PO TB12: 150.0000 mg | ORAL_TABLET | Freq: Every day | ORAL | 0 refills | Status: DC

## 2018-07-14 MED ORDER — VITAMIN D (ERGOCALCIFEROL) 1.25 MG (50000 UNIT) PO CAPS: 50000.0000 [IU] | ORAL_CAPSULE | ORAL | 0 refills | Status: DC

## 2018-07-15 NOTE — Progress Notes (Signed)
Office: [phone]  /  Fax: [phone]   HPI:   Chief Complaint: OBESITY Heidi Robinson is here to discuss her progress with her obesity treatment plan. She is on the  follow the Category 2 plan and is following her eating plan approximately 50 % of the time. She states she is exercising 0 minutes 0 times per week. Heidi Robinson continues to do well with weight loss. She is going through some job stress but her hunger is controlled.  Her weight is 229 lb (103.9 kg) today and has had a weight loss of 6 pounds over a period of 3 weeks since her last visit. She has lost 29 lbs since starting treatment with [place].  Iron Deficiency Anemia Heidi Robinson has a diagnosis of anemia. She is tolerating iron supplement well. Denies constipation and lightheadedness.   Pre-Diabetes Heidi Robinson has a diagnosis of prediabetes based on her elevated HgA1c and was informed this puts her at greater risk of developing diabetes. She is taking metformin currently and continues to work on diet and exercise to decrease risk of diabetes. She denies nausea, vomiting or hypoglycemia.  Vitamin D deficiency Heidi Robinson has a diagnosis of vitamin D deficiency. She is currently taking vit D and denies nausea, vomiting or muscle weakness.   Ref. Range 05/11/2018 07:45  Vitamin D, 25-Hydroxy Latest Ref Range: 30.0 - 100.0 ng/mL 46.7   Depression with emotional eating behaviors Heidi Robinson is struggling with emotional eating and using food for comfort to the extent that it is negatively impacting her health. She often snacks when she is not hungry. Heidi Robinson sometimes feels she is out of control and then feels guilty that she made poor food choices. She has been working on behavior modification techniques to help reduce her emotional eating and has been somewhat successful. She notes her mood has improved on Wellbutrin and a decrease in emotional eating. Her blood pressure is stable and denies insomnia. She shows no sign of suicidal or homicidal ideations.  Depression screen  [facility] 2/9 12/21/2017 11/06/2017 08/04/2017 07/24/2016 05/04/2016  Decreased Interest 3 0 0 0 0  Down, Depressed, Hopeless 3 0 0 0 1  PHQ - 2 Score 6 0 0 0 1  Altered sleeping 2 - - - -  Tired, decreased energy 3 - - - -  Change in appetite 2 - - - -  Feeling bad or failure about yourself  2 - - - -  Trouble concentrating 3 - - - -  Moving slowly or fidgety/restless 2 - - - -  Suicidal thoughts 0 - - - -  PHQ-9 Score 20 - - - -  Difficult doing work/chores Somewhat difficult - - - -   At risk for diabetes Heidi Robinson is at higher than averagerisk for developing diabetes due to her obesity. She currently denies polyuria or polydipsia.   ALLERGIES: Allergies  Allergen Reactions  . Penicillins Anaphylaxis  . Food     Fish, Walnuts, Cashews, Almonds, Pecans, Blueberries, Pine nuts, Pistachios, Macadamia Nuts, Coconuts, Chestnuts    MEDICATIONS: Current Outpatient Medications on File Prior to Visit  Medication Sig Dispense Refill  . lisinopril-hydrochlorothiazide (PRINZIDE,ZESTORETIC) 10-12.5 MG tablet Take 1 tablet by mouth daily. 90 tablet 3  . meloxicam (MOBIC) 15 MG tablet Take 1 tablet (15 mg total) by mouth daily as needed for pain. 30 tablet 5  . Multiple Vitamin (MULTIVITAMINS PO) Take by mouth.    . polyethylene glycol powder (GLYCOLAX/MIRALAX) powder Take 17 g by mouth daily. 3350 g 0   No current  facility-administered medications on file prior to visit.     PAST MEDICAL HISTORY: Past Medical History:  Diagnosis Date  . Allergy   . Anemia   . Arthritis   . Asthma   . Fibroids   . Hypertension   . Iron deficiency anemia due to chronic blood loss   . Obesity (BMI 30-39.9) 02/02/2012  . Sickle cell trait ([place]) 06/04/2014   hemoglobin electrophoresis confirmed    PAST SURGICAL HISTORY: Past Surgical History:  Procedure Laterality Date  . tonsillecomy    . TONSILLECTOMY  1997    SOCIAL HISTORY: Social History   Tobacco Use  . Smoking status: Former Smoker    Years:  2.00    Types: Cigarettes    Last attempt to quit: 07/11/2003    Years since quitting: 15.0  . Smokeless tobacco: Never Used  Substance Use Topics  . Alcohol use: No    Alcohol/week: 0.0 standard drinks  . Drug use: No    FAMILY HISTORY: Family History  Problem Relation Age of Onset  . Hypertension Mother   . Anxiety disorder Mother   . Obesity Mother   . Kidney disease Brother   . Hypertension Maternal Grandmother   . Cancer Maternal Grandmother        bladder  . Hyperlipidemia Father   . Cancer Paternal Grandfather        prostate  . Breast cancer Neg Hx     ROS: Review of Systems  Constitutional: Positive for weight loss.  Gastrointestinal: Negative for constipation, nausea and vomiting.  Musculoskeletal:       Denies muscle weakness  Neurological:       Negative for lightheadedness Negative for insomnia  Endo/Heme/Allergies: Negative for polydipsia.       Negative for hypoglycemia Negative for polyuria  Psychiatric/Behavioral: Positive for depression. Negative for suicidal ideas.       Negative for homicidal ideations     PHYSICAL EXAM: Blood pressure 113/78, pulse 92, temperature 97.9 F (36.6 C), temperature source Oral, height 5\' 2"  (1.575 m), weight 229 lb (103.9 kg), SpO2 98 %. Body mass index is 41.88 kg/m. Physical Exam  Constitutional: She is oriented to person, place, and time. She appears well-developed and well-nourished.  HENT:  Head: Normocephalic.  Eyes: Pupils are equal, round, and reactive to light. EOM are normal.  Neck: Normal range of motion.  Cardiovascular: Normal rate.  Pulmonary/Chest: Effort normal.  Musculoskeletal: Normal range of motion.  Neurological: She is alert and oriented to person, place, and time.  Skin: Skin is warm and dry.  Psychiatric: She has a normal mood and affect. Her behavior is normal.  Vitals reviewed.   RECENT LABS AND TESTS: BMET    Component Value Date/Time   NA 139 05/11/2018 0745   K 4.3  05/11/2018 0745   CL 102 05/11/2018 0745   CO2 22 05/11/2018 0745   GLUCOSE 87 05/11/2018 0745   GLUCOSE 82 07/24/2016 1238   BUN 13 05/11/2018 0745   CREATININE 0.78 05/11/2018 0745   CREATININE 0.80 07/24/2016 1238   CALCIUM 9.7 05/11/2018 0745   GFRNONAA 95 05/11/2018 0745   GFRAA 109 05/11/2018 0745   Lab Results  Component Value Date   HGBA1C 5.2 05/11/2018   HGBA1C 5.7 (H) 12/21/2017   HGBA1C 5.2 08/04/2017   HGBA1C 5.3 07/24/2016   HGBA1C 5.6 07/31/2015   Lab Results  Component Value Date   INSULIN 16.7 05/11/2018   INSULIN 23.4 12/21/2017   CBC    Component  Value Date/Time   WBC 9.9 05/11/2018 0745   WBC 11.5 (H) 07/24/2016 1238   RBC 5.00 05/11/2018 0745   RBC 5.42 (H) 07/24/2016 1238   HGB 10.7 (L) 05/11/2018 0745   HCT 34.5 05/11/2018 0745   PLT 443 (H) 08/04/2017 1006   MCV 69 (L) 05/11/2018 0745   MCH 21.4 (L) 05/11/2018 0745   MCH 23.1 (L) 07/24/2016 1238   MCHC 31.0 (L) 05/11/2018 0745   MCHC 33.6 07/24/2016 1238   RDW 16.8 (H) 05/11/2018 0745   LYMPHSABS 2.0 05/11/2018 0745   MONOABS 0.5 06/04/2014 0828   EOSABS 0.3 05/11/2018 0745   BASOSABS 0.0 05/11/2018 0745   Iron/TIBC/Ferritin/ %Sat    Component Value Date/Time   FERRITIN 19 08/04/2017 1006   Lipid Panel     Component Value Date/Time   CHOL 173 05/11/2018 0745   TRIG 128 05/11/2018 0745   HDL 44 05/11/2018 0745   CHOLHDL 3.8 08/04/2017 1006   CHOLHDL 3.6 07/24/2016 1238   VLDL 27 07/24/2016 1238   LDLCALC 103 (H) 05/11/2018 0745   Hepatic Function Panel     Component Value Date/Time   PROT 6.7 05/11/2018 0745   ALBUMIN 4.4 05/11/2018 0745   AST 12 05/11/2018 0745   ALT 8 05/11/2018 0745   ALKPHOS 63 05/11/2018 0745   BILITOT <0.2 05/11/2018 0745      Component Value Date/Time   TSH 1.270 12/21/2017 0959   TSH 1.140 08/04/2017 1006   TSH 0.96 07/24/2016 1238     Ref. Range 05/11/2018 07:45  Vitamin D, 25-Hydroxy Latest Ref Range: 30.0 - 100.0 ng/mL 46.7   ASSESSMENT  AND PLAN: Prediabetes - Plan: metFORMIN (GLUCOPHAGE) 500 MG tablet  Vitamin D deficiency - Plan: Vitamin D, Ergocalciferol, (DRISDOL) 50000 units CAPS capsule  Other iron deficiency anemia - Plan: ferrous sulfate 325 (65 FE) MG tablet  Other depression - with emotional eating - Plan: buPROPion (WELLBUTRIN SR) 150 MG 12 hr tablet  At risk for diabetes mellitus  Class 3 severe obesity with serious comorbidity and body mass index (BMI) of 40.0 to 44.9 in adult, unspecified obesity type (HCC)  PLAN:  Iron Deficiency Anemia The diagnosis of Iron deficiency anemia was discussed with Heidi Robinson and was explained in detail. She was given suggestions of iron rich foods and and iron supplement was prescribed x 30 with no refills. She agrees to follow up with our clinic as directed.   Pre-Diabetes Heidi Robinson will continue to work on weight loss, exercise, and decreasing simple carbohydrates in her diet to help decrease the risk of diabetes. We dicussed metformin including benefits and risks. She was informed that eating too many simple carbohydrates or too many calories at one sitting increases the likelihood of GI side effects. Heidi Robinson agrees to continue Metformin 500 mg BID x60 with no refills. Heidi Robinson agreed to follow up with [place] as directed to monitor her progress.  Vitamin D Deficiency Heidi Robinson was informed that low vitamin D levels contributes to fatigue and are associated with obesity, breast, and colon cancer. She agrees to continue to take prescription Vit D @50 ,000 IU every week #4 with no refills and will follow up for routine testing of vitamin D, at least 2-3 times per year. She was informed of the risk of over-replacement of vitamin D and agrees to not increase her dose unless she discusses this with [place] first. She agrees to follow up in our clinic as directed.   Depression with Emotional Eating Behaviors We discussed behavior modification techniques  today to help Heidi Robinson deal with her emotional eating and  depression. She has agreed to continue Wellbutrin SR 150 mg qd #30 with no refills and agreed to follow up as directed.  Diabetes risk counseling Heidi Robinson was given extended (15 minutes) diabetes prevention counseling today. She is 42 y.o. female and has risk factors for diabetes including obesity. We discussed intensive lifestyle modifications today with an emphasis on weight loss as well as increasing exercise and decreasing simple carbohydrates in her diet.  Obesity Aalyiah is currently in the action stage of change. As such, her goal is to continue with weight loss efforts She has agreed to follow the Category 2 plan Gabriana has been instructed to work up to a goal of 150 minutes of combined cardio and strengthening exercise per week for weight loss and overall health benefits. We discussed the following Behavioral Modification Stratagies today: increasing lean protein intake, decreasing simple carbohydrates  and work on meal planning and easy cooking plans   Dalani has agreed to follow up with our clinic in 2-3 weeks. She was informed of the importance of frequent follow up visits to maximize her success with intensive lifestyle modifications for her multiple health conditions.   OBESITY BEHAVIORAL INTERVENTION VISIT  Today's visit was # 12   Starting weight: 258 lb Starting date: 12/21/17 Today's weight : 229 lb Today's date: 07/15/2018 Total lbs lost to date: 29 lb    ASK: We discussed the diagnosis of obesity with Cristie Hem today and Clare agreed to give [place] permission to discuss obesity behavioral modification therapy today.  ASSESS: Jeannine has the diagnosis of obesity and her BMI today is 41.87 Maylene is in the action stage of change   ADVISE: Carmita was educated on the multiple health risks of obesity as well as the benefit of weight loss to improve her health. She was advised of the need for long term treatment and the importance of lifestyle modifications to improve her current health and  to decrease her risk of future health problems.  AGREE: Multiple dietary modification options and treatment options were discussed and  Mckenzey agreed to follow the recommendations documented in the above note.  ARRANGE: Cherish was educated on the importance of frequent visits to treat obesity as outlined per CMS and USPSTF guidelines and agreed to schedule her next follow up appointment today.  I, Renee Ramus, am acting as transcriptionist for Dennard Nip, MD  I have reviewed the above documentation for accuracy and completeness, and I agree with the above. -Dennard Nip, MD

## 2018-08-06 ENCOUNTER — Other Ambulatory Visit: Payer: Self-pay

## 2018-08-06 ENCOUNTER — Encounter: Payer: Self-pay | Admitting: Family Medicine

## 2018-08-06 ENCOUNTER — Ambulatory Visit (INDEPENDENT_AMBULATORY_CARE_PROVIDER_SITE_OTHER): Payer: 59 | Admitting: Family Medicine

## 2018-08-06 VITALS — BP 118/78 | HR 81 | Temp 98.5°F | Resp 17 | Ht 62.25 in | Wt 233.0 lb

## 2018-08-06 DIAGNOSIS — Z23 Encounter for immunization: Secondary | ICD-10-CM

## 2018-08-06 DIAGNOSIS — Z1231 Encounter for screening mammogram for malignant neoplasm of breast: Secondary | ICD-10-CM

## 2018-08-06 DIAGNOSIS — Z0000 Encounter for general adult medical examination without abnormal findings: Secondary | ICD-10-CM | POA: Diagnosis not present

## 2018-08-06 DIAGNOSIS — Z1239 Encounter for other screening for malignant neoplasm of breast: Secondary | ICD-10-CM

## 2018-08-06 NOTE — Patient Instructions (Signed)
     If you have lab work done today you will be contacted with your lab results within the next 2 weeks.  If you have not heard from us then please contact us. The fastest way to get your results is to register for My Chart.   IF you received an x-ray today, you will receive an invoice from [facility]. Please contact [facility] at [phone] with questions or concerns regarding your invoice.   IF you received labwork today, you will receive an invoice from [facility]. Please contact [facility] at [phone] with questions or concerns regarding your invoice.   Our billing staff will not be able to assist you with questions regarding bills from these companies.  You will be contacted with the lab results as soon as they are available. The fastest way to get your results is to activate your My Chart account. Instructions are located on the last page of this paperwork. If you have not heard from us regarding the results in 2 weeks, please contact this office.    We recommend that you schedule a mammogram for breast cancer screening. Typically, you do not need a referral to do this. Please contact a local imaging center to schedule your mammogram.  [facility] - [phone]  *ask for the Radiology Department [facility]) - [phone] or [phone]  [facility] - [phone] [facility] - [phone] [facility] - [phone]  *ask for the Radiology Department [facility] - [phone]  *ask for the Radiology Department [facility] - [phone]  *ask for the Mammography Department [facility] - [phone] 

## 2018-08-06 NOTE — Progress Notes (Signed)
Chief Complaint  Patient presents with  . Annual Exam    cpe no pap for work and has form needing to be completed (Physician's result form)    Subjective:  Heidi Robinson is a 42 y.o. female here for a health maintenance visit.  Patient is established pt  Patient Active Problem List   Diagnosis Date Noted  . Other constipation 01/18/2018  . Vitamin D deficiency 01/04/2018  . Anemia 01/04/2018  . Prediabetes 01/04/2018  . Other fatigue 12/21/2017  . Shortness of breath on exertion 12/21/2017  . Essential hypertension 12/21/2017  . Cough 11/06/2017  . Mild intermittent asthma without complication 63/87/5643  . Lower resp. tract infection 11/06/2017  . Iron deficiency anemia due to chronic blood loss 08/03/2017  . Pure hypercholesterolemia 08/03/2017  . Sickle cell trait ([place]) 06/12/2014  . Obesity (BMI 35.0-39.9 without comorbidity) 05/23/2013  . HTN (hypertension) 02/02/2012    Past Medical History:  Diagnosis Date  . Allergy   . Anemia   . Arthritis   . Asthma   . Fibroids   . Hypertension   . Iron deficiency anemia due to chronic blood loss   . Obesity (BMI 30-39.9) 02/02/2012  . Sickle cell trait ([name]) 06/04/2014   hemoglobin electrophoresis confirmed    Past Surgical History:  Procedure Laterality Date  . tonsillecomy    . TONSILLECTOMY  1997     Outpatient Medications Prior to Visit  Medication Sig Dispense Refill  . buPROPion (WELLBUTRIN SR) 150 MG 12 hr tablet Take 1 tablet (150 mg total) by mouth daily. 30 tablet 0  . ferrous sulfate 325 (65 FE) MG tablet Take 1 tablet (325 mg total) by mouth daily with breakfast. 30 tablet 0  . lisinopril-hydrochlorothiazide (PRINZIDE,ZESTORETIC) 10-12.5 MG tablet Take 1 tablet by mouth daily. 90 tablet 3  . meloxicam (MOBIC) 15 MG tablet Take 1 tablet (15 mg total) by mouth daily as needed for pain. 30 tablet 5  . metFORMIN (GLUCOPHAGE) 500 MG tablet Take 1 tablet (500 mg total) by mouth 2 (two) times daily with a meal. 60  tablet 0  . Multiple Vitamin (MULTIVITAMINS PO) Take by mouth.    . polyethylene glycol powder (GLYCOLAX/MIRALAX) powder Take 17 g by mouth daily. 3350 g 0  . Vitamin D, Ergocalciferol, (DRISDOL) 50000 units CAPS capsule Take 1 capsule (50,000 Units total) by mouth every 7 (seven) days. 4 capsule 0   No facility-administered medications prior to visit.     Allergies  Allergen Reactions  . Penicillins Anaphylaxis  . Food     Fish, Walnuts, Cashews, Almonds, Pecans, Blueberries, Pine nuts, Pistachios, Macadamia Nuts, Coconuts, Chestnuts     Family History  Problem Relation Age of Onset  . Hypertension Mother   . Anxiety disorder Mother   . Obesity Mother   . Kidney disease Brother   . Hypertension Maternal Grandmother   . Cancer Maternal Grandmother        bladder  . Hyperlipidemia Father   . Cancer Paternal Grandfather        prostate  . Breast cancer Neg Hx      Health Habits: Dental Exam: up to date Eye Exam: up to date Exercise: 3 times/week on average Current exercise activities: walking/running Diet: balanced   Social History   Socioeconomic History  . Marital status: Married    Spouse name: Marya Amsler  . Number of children: Not on file  . Years of education: Not on file  . Highest education level: Not on file  Occupational History  . Occupation: Energy manager   Social Needs  . Financial resource strain: Not on file  . Food insecurity:    Worry: Not on file    Inability: Not on file  . Transportation needs:    Medical: Not on file    Non-medical: Not on file  Tobacco Use  . Smoking status: Former Smoker    Years: 2.00    Types: Cigarettes    Last attempt to quit: 07/11/2003    Years since quitting: 15.0  . Smokeless tobacco: Never Used  Substance and Sexual Activity  . Alcohol use: No    Alcohol/week: 0.0 standard drinks  . Drug use: No  . Sexual activity: Yes    Partners: Male  Lifestyle  . Physical activity:    Days per week: Not on file     Minutes per session: Not on file  . Stress: Not on file  Relationships  . Social connections:    Talks on phone: Not on file    Gets together: Not on file    Attends religious service: Not on file    Active member of club or organization: Not on file    Attends meetings of clubs or organizations: Not on file    Relationship status: Not on file  . Intimate partner violence:    Fear of current or ex partner: Not on file    Emotionally abused: Not on file    Physically abused: Not on file    Forced sexual activity: Not on file  Other Topics Concern  . Not on file  Social History Narrative   Married with 2 children. Patient has a Financial risk analyst. Exercise: Cardio 2-3 times a week for 30 minutes.   Social History   Substance and Sexual Activity  Alcohol Use No  . Alcohol/week: 0.0 standard drinks   Social History   Tobacco Use  Smoking Status Former Smoker  . Years: 2.00  . Types: Cigarettes  . Last attempt to quit: 07/11/2003  . Years since quitting: 15.0  Smokeless Tobacco Never Used   Social History   Substance and Sexual Activity  Drug Use No    GYN: Sexual Health Menstrual status: regular menses LMP: Patient's last menstrual period was 07/18/2018. Last pap smear: see HM section History of abnormal pap smears:  Sexually active: with female partner Current contraception: none  Health Maintenance: See under health Maintenance activity for review of completion dates as well. Immunization History  Administered Date(s) Administered  . Influenza,inj,Quad PF,6+ Mos 07/31/2015, 07/24/2016, 08/04/2017, 08/06/2018  . Pneumococcal Polysaccharide-23 05/19/2013  . Tdap 11/09/2008      Depression Screen-PHQ2/9 Depression screen [facility] 2/9 08/06/2018 12/21/2017 11/06/2017 08/04/2017 07/24/2016  Decreased Interest 0 3 0 0 0  Down, Depressed, Hopeless 0 3 0 0 0  PHQ - 2 Score 0 6 0 0 0  Altered sleeping - 2 - - -  Tired, decreased energy - 3 - - -  Change in appetite - 2  - - -  Feeling bad or failure about yourself  - 2 - - -  Trouble concentrating - 3 - - -  Moving slowly or fidgety/restless - 2 - - -  Suicidal thoughts - 0 - - -  PHQ-9 Score - 20 - - -  Difficult doing work/chores - Somewhat difficult - - -       Depression Severity and Treatment Recommendations:  0-4= None  5-9= Mild / Treatment: Support, educate to call if worse; return  in one month  10-14= Moderate / Treatment: Support, watchful waiting; Antidepressant or Psycotherapy  15-19= Moderately severe / Treatment: Antidepressant OR Psychotherapy  >= 20 = Major depression, severe / Antidepressant AND Psychotherapy    Review of Systems   Review of Systems  Constitutional: Negative for chills and fever.  HENT: Negative for congestion, nosebleeds and sinus pain.   Eyes: Negative for blurred vision and double vision.  Respiratory: Negative for cough, shortness of breath and wheezing.   Cardiovascular: Negative for chest pain and palpitations.  Gastrointestinal: Negative for abdominal pain, constipation, diarrhea, nausea and vomiting.  Musculoskeletal: Negative for back pain, myalgias and neck pain.  Skin: Negative for itching and rash.  Neurological: Negative for dizziness and tremors.  Psychiatric/Behavioral: Negative for depression. The patient is not nervous/anxious and does not have insomnia.      Objective:   Vitals:   08/06/18 1014  BP: 118/78  Pulse: 81  Resp: 17  Temp: 98.5 F (36.9 C)  TempSrc: Oral  SpO2: 98%  Weight: 233 lb (105.7 kg)  Height: 5' 2.25" (1.581 m)    Body mass index is 42.27 kg/m.  Physical Exam  BP 118/78 (BP Location: Right Arm, Patient Position: Sitting, Cuff Size: Large)   Pulse 81   Temp 98.5 F (36.9 C) (Oral)   Resp 17   Ht 5' 2.25" (1.581 m)   Wt 233 lb (105.7 kg)   LMP 07/18/2018   SpO2 98%   BMI 42.27 kg/m   General Appearance:    Alert, cooperative, no distress, appears stated age  Head:    Normocephalic, without  obvious abnormality, atraumatic  Eyes:    PERRL, conjunctiva/corneas clear, EOM's intact, fundi    benign, both eyes  Ears:    Normal TM's and external ear canals, both ears  Nose:   Nares normal, septum midline, mucosa normal, no drainage    or sinus tenderness  Throat:   Lips, mucosa, and tongue normal; teeth and gums normal  Neck:   Supple, symmetrical, trachea midline, no adenopathy;    thyroid:  no enlargement/tenderness/nodules; no carotid   bruit or JVD  Back:     Symmetric, no curvature, ROM normal, no CVA tenderness  Lungs:     Clear to auscultation bilaterally, respirations unlabored  Chest Wall:    No tenderness or deformity   Heart:    Regular rate and rhythm, S1 and S2 normal, no murmur, rub   or gallop  Breast Exam:    Large, dense tissue, No tenderness, masses, or nipple abnormality  Abdomen:     Soft, non-tender, bowel sounds active all four quadrants,    no masses, no organomegaly  Extremities:   Extremities normal, atraumatic, no cyanosis or edema  Pulses:   2+ and symmetric all extremities  Skin:   Skin color, texture, turgor normal, no rashes or lesions  Lymph nodes:   Cervical, supraclavicular, and axillary nodes normal  Neurologic:   CNII-XII intact, normal strength, sensation and reflexes    throughout      Assessment/Plan:   Patient was seen for a health maintenance exam.  Counseled the patient on health maintenance issues. Reviewed her health mainteance schedule and ordered appropriate tests (see orders.) Counseled on regular exercise and weight management. Recommend regular eye exams and dental cleaning.   The following issues were addressed today for health maintenance:   Heidi Robinson was seen today for annual exam.  Diagnoses and all orders for this visit:  Encounter for health maintenance examination in  adult Women's Health Maintenance Plan Advised monthly breast exam and annual mammogram Advised dental exam every six months Discussed stress  management Discussed pap smear screening guidelines Pap and mammogram utd  Other orders -     Flu Vaccine QUAD 36+ mos IM    Return in about 1 year (around 08/07/2019) for physical exam .    Body mass index is 42.27 kg/m.:  Discussed the patient's BMI with patient. The BMI body mass index is 42.27 kg/m.     Future Appointments  Date Time Provider [place]  08/08/2018  5:40 PM Starlyn Skeans, MD [facility] None    Patient Instructions       If you have lab work done today you will be contacted with your lab results within the next 2 weeks.  If you have not heard from [place] then please contact us. The fastest way to get your results is to register for My Chart.   IF you received an x-ray today, you will receive an invoice from [facility] Rad[facility]. Please contact [facility] Radiology at [phone] with questions or concerns regarding your invoice.   IF you received labwork today, you will receive an invoice from [facility]. Please contact [facility] at [phone] with questions or concerns regarding your invoice.   Our billing staff will not be able to assist you with questions regarding bills from these companies.  You will be contacted with the lab results as soon as they are available. The fastest way to get your results is to activate your My Chart account. Instructions are located on the last page of this paperwork. If you have not heard from [place] regarding the results in 2 weeks, please contact this office.    We recommend that you schedule a mammogram for breast cancer screening. Typically, you do not need a referral to do this. Please contact a local imaging center to schedule your mammogram.  [facility] - [phone]  *ask for the Radiology Department [facility] - [phone] or [phone]  [facility] - [phone] [place] [phone] [facility] - [phone]  *ask for the  [facility] - [phone]  *ask for the Radiology Department [facility] - [phone]  *ask for the [place] - [phone]

## 2018-08-08 ENCOUNTER — Encounter (INDEPENDENT_AMBULATORY_CARE_PROVIDER_SITE_OTHER): Payer: Self-pay | Admitting: Family Medicine

## 2018-08-08 ENCOUNTER — Ambulatory Visit (INDEPENDENT_AMBULATORY_CARE_PROVIDER_SITE_OTHER): Payer: 59 | Admitting: Family Medicine

## 2018-08-08 VITALS — BP 122/84 | HR 104 | Temp 98.2°F | Ht 62.0 in | Wt 232.0 lb

## 2018-08-08 DIAGNOSIS — E559 Vitamin D deficiency, unspecified: Secondary | ICD-10-CM

## 2018-08-08 DIAGNOSIS — F3289 Other specified depressive episodes: Secondary | ICD-10-CM

## 2018-08-08 DIAGNOSIS — Z9189 Other specified personal risk factors, not elsewhere classified: Secondary | ICD-10-CM

## 2018-08-08 DIAGNOSIS — Z6841 Body Mass Index (BMI) 40.0 and over, adult: Secondary | ICD-10-CM

## 2018-08-08 MED ORDER — VITAMIN D (ERGOCALCIFEROL) 1.25 MG (50000 UNIT) PO CAPS: 50000.0000 [IU] | ORAL_CAPSULE | ORAL | 0 refills | Status: DC

## 2018-08-08 MED ORDER — BUPROPION HCL ER (SR) 200 MG PO TB12: 200.0000 mg | ORAL_TABLET | Freq: Every day | ORAL | 0 refills | Status: DC

## 2018-08-09 NOTE — Progress Notes (Signed)
Office: [phone]  /  Fax: [phone]   HPI:   Chief Complaint: OBESITY Heidi Robinson is here to discuss her progress with her obesity treatment plan. She is on the Category 2 plan and is following her eating plan approximately 50 % of the time. She states she is doing cardio and weights 30 minutes 3 times per week. Heidi Robinson has had increased stress at work and is unhappy in her job. She notes more emotional eating, especially on the weekends. Although most of her weight gain is water weight.  Her weight is 232 lb (105.2 kg) today and has not lost weight since her last visit. She has lost 26 lbs since starting treatment with [place].  Depression with emotional eating behaviors Heidi Robinson is struggling with emotional eating and using food for comfort to the extent that it is negatively impacting her health. She often snacks when she is not hungry. Heidi Robinson sometimes feels she is out of control and then feels guilty that she made poor food choices. She notes a decrease in her mood with increased work stress. She has increased comfort eating.   Vitamin D deficiency Heidi Robinson has a diagnosis of vitamin D deficiency. Her vitamin D level is still not yet at goal. She is still currently taking vit D and denies nausea, vomiting or muscle weakness.  At risk for cardiovascular disease Heidi Robinson is at a higher than average risk for cardiovascular disease due to obesity.   ALLERGIES: Allergies  Allergen Reactions  . Penicillins Anaphylaxis  . Food     Fish, Walnuts, Cashews, Almonds, Pecans, Blueberries, Pine nuts, Pistachios, Macadamia Nuts, Coconuts, Chestnuts    MEDICATIONS: Current Outpatient Medications on File Prior to Visit  Medication Sig Dispense Refill  . ferrous sulfate 325 (65 FE) MG tablet Take 1 tablet (325 mg total) by mouth daily with breakfast. 30 tablet 0  . lisinopril-hydrochlorothiazide (PRINZIDE,ZESTORETIC) 10-12.5 MG tablet Take 1 tablet by mouth daily. 90 tablet 3  . meloxicam (MOBIC) 15 MG tablet Take  1 tablet (15 mg total) by mouth daily as needed for pain. 30 tablet 5  . metFORMIN (GLUCOPHAGE) 500 MG tablet Take 1 tablet (500 mg total) by mouth 2 (two) times daily with a meal. 60 tablet 0  . Multiple Vitamin (MULTIVITAMINS PO) Take by mouth.    . polyethylene glycol powder (GLYCOLAX/MIRALAX) powder Take 17 g by mouth daily. 3350 g 0   No current facility-administered medications on file prior to visit.     PAST MEDICAL HISTORY: Past Medical History:  Diagnosis Date  . Allergy   . Anemia   . Arthritis   . Asthma   . Fibroids   . Hypertension   . Iron deficiency anemia due to chronic blood loss   . Obesity (BMI 30-39.9) 02/02/2012  . Sickle cell trait ([place]) 06/04/2014   hemoglobin electrophoresis confirmed    PAST SURGICAL HISTORY: Past Surgical History:  Procedure Laterality Date  . tonsillecomy    . TONSILLECTOMY  1997    SOCIAL HISTORY: Social History   Tobacco Use  . Smoking status: Former Smoker    Years: 2.00    Types: Cigarettes    Last attempt to quit: 07/11/2003    Years since quitting: 15.0  . Smokeless tobacco: Never Used  Substance Use Topics  . Alcohol use: No    Alcohol/week: 0.0 standard drinks  . Drug use: No    FAMILY HISTORY: Family History  Problem Relation Age of Onset  . Hypertension Mother   . Anxiety disorder  Mother   . Obesity Mother   . Kidney disease Brother   . Hypertension Maternal Grandmother   . Cancer Maternal Grandmother        bladder  . Hyperlipidemia Father   . Cancer Paternal Grandfather        prostate  . Breast cancer Neg Hx     ROS: Review of Systems  Constitutional: Negative for weight loss.  Gastrointestinal: Negative for nausea and vomiting.  Musculoskeletal:       Negative for muscle weakness.  Psychiatric/Behavioral: Positive for depression.    PHYSICAL EXAM: Blood pressure 122/84, pulse (!) 104, temperature 98.2 F (36.8 C), temperature source Oral, height 5\' 2"  (1.575 m), weight 232 lb (105.2 kg),  last menstrual period 07/18/2018, SpO2 98 %. Body mass index is 42.43 kg/m. Physical Exam  Constitutional: She is oriented to person, place, and time. She appears well-developed and well-nourished.  Cardiovascular: Normal rate.  Pulmonary/Chest: Effort normal.  Musculoskeletal: Normal range of motion.  Neurological: She is oriented to person, place, and time.  Skin: Skin is warm and dry.  Psychiatric: She has a normal mood and affect. Her behavior is normal.  Vitals reviewed.   RECENT LABS AND TESTS: BMET    Component Value Date/Time   NA 139 05/11/2018 0745   K 4.3 05/11/2018 0745   CL 102 05/11/2018 0745   CO2 22 05/11/2018 0745   GLUCOSE 87 05/11/2018 0745   GLUCOSE 82 07/24/2016 1238   BUN 13 05/11/2018 0745   CREATININE 0.78 05/11/2018 0745   CREATININE 0.80 07/24/2016 1238   CALCIUM 9.7 05/11/2018 0745   GFRNONAA 95 05/11/2018 0745   GFRAA 109 05/11/2018 0745   Lab Results  Component Value Date   HGBA1C 5.2 05/11/2018   HGBA1C 5.7 (H) 12/21/2017   HGBA1C 5.2 08/04/2017   HGBA1C 5.3 07/24/2016   HGBA1C 5.6 07/31/2015   Lab Results  Component Value Date   INSULIN 16.7 05/11/2018   INSULIN 23.4 12/21/2017   CBC    Component Value Date/Time   WBC 9.9 05/11/2018 0745   WBC 11.5 (H) 07/24/2016 1238   RBC 5.00 05/11/2018 0745   RBC 5.42 (H) 07/24/2016 1238   HGB 10.7 (L) 05/11/2018 0745   HCT 34.5 05/11/2018 0745   PLT 443 (H) 08/04/2017 1006   MCV 69 (L) 05/11/2018 0745   MCH 21.4 (L) 05/11/2018 0745   MCH 23.1 (L) 07/24/2016 1238   MCHC 31.0 (L) 05/11/2018 0745   MCHC 33.6 07/24/2016 1238   RDW 16.8 (H) 05/11/2018 0745   LYMPHSABS 2.0 05/11/2018 0745   MONOABS 0.5 06/04/2014 0828   EOSABS 0.3 05/11/2018 0745   BASOSABS 0.0 05/11/2018 0745   Iron/TIBC/Ferritin/ %Sat    Component Value Date/Time   FERRITIN 19 08/04/2017 1006   Lipid Panel     Component Value Date/Time   CHOL 173 05/11/2018 0745   TRIG 128 05/11/2018 0745   HDL 44 05/11/2018  0745   CHOLHDL 3.8 08/04/2017 1006   CHOLHDL 3.6 07/24/2016 1238   VLDL 27 07/24/2016 1238   LDLCALC 103 (H) 05/11/2018 0745   Hepatic Function Panel     Component Value Date/Time   PROT 6.7 05/11/2018 0745   ALBUMIN 4.4 05/11/2018 0745   AST 12 05/11/2018 0745   ALT 8 05/11/2018 0745   ALKPHOS 63 05/11/2018 0745   BILITOT <0.2 05/11/2018 0745      Component Value Date/Time   TSH 1.270 12/21/2017 0959   TSH 1.140 08/04/2017 1006   TSH  0.96 07/24/2016 1238   Results for DESHANDA, MOLITOR (MRN [id number]) as of 08/09/2018 08:24  Ref. Range 05/11/2018 07:45  Vitamin D, 25-Hydroxy Latest Ref Range: 30.0 - 100.0 ng/mL 46.7    ASSESSMENT AND PLAN: Vitamin D deficiency - Plan: Vitamin D, Ergocalciferol, (DRISDOL) 50000 units CAPS capsule  Other depression - with emotional eating - Plan: buPROPion (WELLBUTRIN SR) 200 MG 12 hr tablet  At risk for heart disease  Class 3 severe obesity with serious comorbidity and body mass index (BMI) of 40.0 to 44.9 in adult, unspecified obesity type (HCC)  PLAN:  Vitamin D Deficiency Heidi Robinson was informed that low vitamin D levels contributes to fatigue and are associated with obesity, breast, and colon cancer. She agrees to continue to take prescription Vit D @50 ,000 IU every week # 4 with no refills and will follow up for routine testing of vitamin D, at least 2-3 times per year. She was informed of the risk of over-replacement of vitamin D and agrees to not increase her dose unless she discusses this with [place] first. Heidi Robinson agreed to return for an office visit in 2 to 3 weeks.  Depression with Emotional Eating Behaviors We discussed behavior modification techniques today to help Heidi Robinson deal with her emotional eating and depression. She has agreed to increase her Wellbutrin SR to 200 mg qd qAM #30 with no refills and agreed to follow up as directed in 2 to 3 weeks.  Cardiovascular risk counseling Heidi Robinson was given extended (15 minutes) coronary artery disease  prevention counseling today. She is 42 y.o. female and has risk factors for heart disease including obesity. We discussed intensive lifestyle modifications today with an emphasis on specific weight loss instructions and strategies. She was also informed of the importance of increasing exercise and decreasing saturated fats to help prevent heart disease.  Obesity Heidi Robinson is currently in the action stage of change. As such, her goal is to continue with weight loss efforts. She has agreed to follow the Category 2 plan. Heidi Robinson has been instructed to work up to a goal of 150 minutes of combined cardio and strengthening exercise per week for weight loss and overall health benefits. We discussed the following Behavioral Modification Strategies today: increasing lean protein intake, decreasing simple carbohydrates,  and emotional eating strategies.  Heidi Robinson has agreed to follow up with our clinic in 2 to 3 weeks. She was informed of the importance of frequent follow up visits to maximize her success with intensive lifestyle modifications for her multiple health conditions.   OBESITY BEHAVIORAL INTERVENTION VISIT  Today's visit was # 13   Starting weight: 258 lbs Starting date: 12/21/17 Today's weight : Weight: 232 lb (105.2 kg)  Today's date: 08/08/18 Total lbs lost to date: 18  ASK: We discussed the diagnosis of obesity with Heidi Robinson today and Heidi Robinson agreed to give [place] permission to discuss obesity behavioral modification therapy today.  ASSESS: Heidi Robinson has the diagnosis of obesity and her BMI today is 42.42. Heidi Robinson is in the action stage of change.   ADVISE: Heidi Robinson was educated on the multiple health risks of obesity as well as the benefit of weight loss to improve her health. She was advised of the need for long term treatment and the importance of lifestyle modifications to improve her current health and to decrease her risk of future health problems.  AGREE: Multiple dietary modification options and  treatment options were discussed and Heidi Robinson agreed to follow the recommendations documented in the above note.  ARRANGE:  Heidi Robinson was educated on the importance of frequent visits to treat obesity as outlined per CMS and USPSTF guidelines and agreed to schedule her next follow up appointment today.  I, Marcille Blanco, am acting as transcriptionist for Starlyn Skeans, MD  I have reviewed the above documentation for accuracy and completeness, and I agree with the above. -Dennard Nip, MD

## 2018-08-13 ENCOUNTER — Other Ambulatory Visit: Payer: Self-pay | Admitting: Family Medicine

## 2018-08-13 DIAGNOSIS — I10 Essential (primary) hypertension: Secondary | ICD-10-CM

## 2018-08-15 NOTE — Telephone Encounter (Signed)
Requested Prescriptions  Pending Prescriptions Disp Refills  . lisinopril-hydrochlorothiazide (PRINZIDE,ZESTORETIC) 10-12.5 MG tablet [Pharmacy Med Name: LISINOPRIL/HCTZ 10-12.5MG  TAB] 30 tablet 11    Sig: TAKE 1 TABLET BY MOUTH ONCE DAILY     Cardiovascular:  ACEI + Diuretic Combos Passed - 08/13/2018  1:30 PM      Passed - Na in normal range and within 180 days    Sodium  Date Value Ref Range Status  05/11/2018 139 134 - 144 mmol/L Final         Passed - K in normal range and within 180 days    Potassium  Date Value Ref Range Status  05/11/2018 4.3 3.5 - 5.2 mmol/L Final         Passed - Cr in normal range and within 180 days    Creat  Date Value Ref Range Status  07/24/2016 0.80 0.50 - 1.10 mg/dL Final   Creatinine, Ser  Date Value Ref Range Status  05/11/2018 0.78 0.57 - 1.00 mg/dL Final         Passed - Ca in normal range and within 180 days    Calcium  Date Value Ref Range Status  05/11/2018 9.7 8.7 - 10.2 mg/dL Final         Passed - Patient is not pregnant      Passed - Last BP in normal range    BP Readings from Last 1 Encounters:  08/08/18 122/84         Passed - Valid encounter within last 6 months    Recent Outpatient Visits          1 week ago Encounter for health maintenance examination in adult   [facility] at [facility], Arlie Solomons, MD   9 months ago Cough   [facility] at [place], Ines Bloomer, MD   1 year ago Annual physical exam   [facility], [name], MD   2 years ago Annual physical exam   [facility], [name], MD   2 years ago Essential hypertension   [facility], [name], MD

## 2018-08-22 ENCOUNTER — Telehealth (INDEPENDENT_AMBULATORY_CARE_PROVIDER_SITE_OTHER): Payer: Self-pay | Admitting: Family Medicine

## 2018-08-22 NOTE — Telephone Encounter (Signed)
Heidi Robinson was last seen 9/30 by Dr. Leafy Ro.  She stated that a refill of her Metformin was to be sent to [facility] on [address].  [facility] has told her they don't have it.  Please advise. Thank you. [name]

## 2018-08-24 ENCOUNTER — Other Ambulatory Visit (INDEPENDENT_AMBULATORY_CARE_PROVIDER_SITE_OTHER): Payer: Self-pay

## 2018-08-24 DIAGNOSIS — R7303 Prediabetes: Secondary | ICD-10-CM

## 2018-08-24 MED ORDER — METFORMIN HCL 500 MG PO TABS: 500.0000 mg | ORAL_TABLET | Freq: Two times a day (BID) | ORAL | 0 refills | Status: DC

## 2018-08-24 NOTE — Telephone Encounter (Signed)
The prescription was sent in to the pharmacy. [name] Mcglory, CMA

## 2018-08-30 ENCOUNTER — Ambulatory Visit (INDEPENDENT_AMBULATORY_CARE_PROVIDER_SITE_OTHER): Payer: 59 | Admitting: Family Medicine

## 2018-08-30 VITALS — BP 115/81 | HR 86 | Temp 97.9°F | Ht 62.0 in | Wt 226.0 lb

## 2018-08-30 DIAGNOSIS — E559 Vitamin D deficiency, unspecified: Secondary | ICD-10-CM | POA: Diagnosis not present

## 2018-08-30 DIAGNOSIS — D508 Other iron deficiency anemias: Secondary | ICD-10-CM | POA: Diagnosis not present

## 2018-08-30 DIAGNOSIS — Z6841 Body Mass Index (BMI) 40.0 and over, adult: Secondary | ICD-10-CM

## 2018-08-30 DIAGNOSIS — R7303 Prediabetes: Secondary | ICD-10-CM | POA: Diagnosis not present

## 2018-08-30 DIAGNOSIS — Z9189 Other specified personal risk factors, not elsewhere classified: Secondary | ICD-10-CM | POA: Diagnosis not present

## 2018-08-30 DIAGNOSIS — F3289 Other specified depressive episodes: Secondary | ICD-10-CM | POA: Diagnosis not present

## 2018-08-30 MED ORDER — BUPROPION HCL ER (SR) 200 MG PO TB12: 200.0000 mg | ORAL_TABLET | Freq: Every day | ORAL | 0 refills | Status: DC

## 2018-08-30 MED ORDER — METFORMIN HCL 500 MG PO TABS: 500.0000 mg | ORAL_TABLET | Freq: Two times a day (BID) | ORAL | 0 refills | Status: DC

## 2018-08-30 MED ORDER — VITAMIN D (ERGOCALCIFEROL) 1.25 MG (50000 UNIT) PO CAPS: 50000.0000 [IU] | ORAL_CAPSULE | ORAL | 0 refills | Status: DC

## 2018-08-30 MED ORDER — FERROUS SULFATE 325 (65 FE) MG PO TABS: 325.0000 mg | ORAL_TABLET | Freq: Every day | ORAL | 0 refills | Status: DC

## 2018-08-31 NOTE — Progress Notes (Signed)
Office: [phone]  /  Fax: [phone]   HPI:   Chief Complaint: OBESITY Heidi Robinson is here to discuss her progress with her obesity treatment plan. She is following the Category 2 plan and is following her eating plan approximately 20 % of the time. She states she is exercising, walking and going to the gym for 30 minutes 3 times per week. Heidi Robinson continues to do well with weight loss, but is not always eating all her food due to increased work stress.  Her weight is 226 lb (102.5 kg) today and has had a weight loss of 6 pounds over a period of 4 week since her last visit. She has lost 32 lbs since starting treatment with [place].  Pre-Diabetes Heidi Robinson has a diagnosis of pre-diabetes based on her elevated Hgb A1c and was informed this puts her at greater risk of developing diabetes. Heidi Robinson's levels are improving on diet and Metformin. She denies nausea, vomiting or hypoglycemia.  At risk for diabetes Heidi Robinson is at higher than average risk for developing diabetes due to her obesity and pre-diabetes. She currently denies polyuria or polydipsia.  Vitamin D deficiency Heidi Robinson has a diagnosis of vitamin D deficiency. She is stable on prescription Vit D, level is almost at goal.  She notes some fatigue and not sleeping well. She denies nausea, vomiting and muscle weakness.   Iron Deficiency Anemia Heidi Robinson has a diagnosis of anemia.  She is on FeSo4, she is also working on increasing iron rich foods. She is due for labs in the next month.   Depression with Anxiety  Heidi Robinson's mood is stable on Wellbutrin, but she sometimes notes increased spells of anxiety. She doesn't want to take medications, but she is trying supplements. Heidi Robinson struggles with emotional eating and using food for comfort to the extent that it is negatively impacting her health. She often snacks when she is not hungry. Heidi Robinson sometimes feels she is out of control and then feels guilty that she made poor food choices. She has been working on behavior  modification techniques to help reduce her emotional eating. She shows no sign of suicidal or homicidal ideations.  Depression screen [facility] 2/9 08/06/2018 12/21/2017 11/06/2017 08/04/2017 07/24/2016  Decreased Interest 0 3 0 0 0  Down, Depressed, Hopeless 0 3 0 0 0  PHQ - 2 Score 0 6 0 0 0  Altered sleeping - 2 - - -  Tired, decreased energy - 3 - - -  Change in appetite - 2 - - -  Feeling bad or failure about yourself  - 2 - - -  Trouble concentrating - 3 - - -  Moving slowly or fidgety/restless - 2 - - -  Suicidal thoughts - 0 - - -  PHQ-9 Score - 20 - - -  Difficult doing work/chores - Somewhat difficult - - -      ALLERGIES: Allergies  Allergen Reactions  . Penicillins Anaphylaxis  . Food     Fish, Walnuts, Cashews, Almonds, Pecans, Blueberries, Pine nuts, Pistachios, Macadamia Nuts, Coconuts, Chestnuts    MEDICATIONS: Current Outpatient Medications on File Prior to Visit  Medication Sig Dispense Refill  . lisinopril-hydrochlorothiazide (PRINZIDE,ZESTORETIC) 10-12.5 MG tablet TAKE 1 TABLET BY MOUTH ONCE DAILY 30 tablet 11  . meloxicam (MOBIC) 15 MG tablet Take 1 tablet (15 mg total) by mouth daily as needed for pain. 30 tablet 5  . Multiple Vitamin (MULTIVITAMINS PO) Take by mouth.    . polyethylene glycol powder (GLYCOLAX/MIRALAX) powder Take 17 g by mouth  daily. 3350 g 0   No current facility-administered medications on file prior to visit.     PAST MEDICAL HISTORY: Past Medical History:  Diagnosis Date  . Allergy   . Anemia   . Arthritis   . Asthma   . Fibroids   . Hypertension   . Iron deficiency anemia due to chronic blood loss   . Obesity (BMI 30-39.9) 02/02/2012  . Sickle cell trait ([place]) 06/04/2014   hemoglobin electrophoresis confirmed    PAST SURGICAL HISTORY: Past Surgical History:  Procedure Laterality Date  . tonsillecomy    . TONSILLECTOMY  1997    SOCIAL HISTORY: Social History   Tobacco Use  . Smoking status: Former Smoker    Years: 2.00      Types: Cigarettes    Last attempt to quit: 07/11/2003    Years since quitting: 15.1  . Smokeless tobacco: Never Used  Substance Use Topics  . Alcohol use: No    Alcohol/week: 0.0 standard drinks  . Drug use: No    FAMILY HISTORY: Family History  Problem Relation Age of Onset  . Hypertension Mother   . Anxiety disorder Mother   . Obesity Mother   . Kidney disease Brother   . Hypertension Maternal Grandmother   . Cancer Maternal Grandmother        bladder  . Hyperlipidemia Father   . Cancer Paternal Grandfather        prostate  . Breast cancer Neg Hx     ROS: Review of Systems  Constitutional: Positive for malaise/fatigue and weight loss.  Gastrointestinal: Negative for nausea and vomiting.  Musculoskeletal:       Negative muscle weakness  Endo/Heme/Allergies:       Negative for hypoglycemia  Psychiatric/Behavioral: Positive for depression. Negative for suicidal ideas.       +anxiety     PHYSICAL EXAM: Blood pressure 115/81, pulse 86, temperature 97.9 F (36.6 C), temperature source Oral, height 5\' 2"  (1.575 m), weight 226 lb (102.5 kg), last menstrual period 08/16/2018, SpO2 99 %. Body mass index is 41.34 kg/m. Physical Exam  Constitutional: She is oriented to person, place, and time. She appears well-developed and well-nourished.  Cardiovascular: Normal rate.  Pulmonary/Chest: Effort normal.  Musculoskeletal: Normal range of motion.  Neurological: She is oriented to person, place, and time.  Skin: Skin is warm and dry.  Psychiatric: She has a normal mood and affect. Her behavior is normal.  Vitals reviewed.   RECENT LABS AND TESTS: BMET    Component Value Date/Time   NA 139 05/11/2018 0745   K 4.3 05/11/2018 0745   CL 102 05/11/2018 0745   CO2 22 05/11/2018 0745   GLUCOSE 87 05/11/2018 0745   GLUCOSE 82 07/24/2016 1238   BUN 13 05/11/2018 0745   CREATININE 0.78 05/11/2018 0745   CREATININE 0.80 07/24/2016 1238   CALCIUM 9.7 05/11/2018 0745    GFRNONAA 95 05/11/2018 0745   GFRAA 109 05/11/2018 0745   Lab Results  Component Value Date   HGBA1C 5.2 05/11/2018   HGBA1C 5.7 (H) 12/21/2017   HGBA1C 5.2 08/04/2017   HGBA1C 5.3 07/24/2016   HGBA1C 5.6 07/31/2015   Lab Results  Component Value Date   INSULIN 16.7 05/11/2018   INSULIN 23.4 12/21/2017   CBC    Component Value Date/Time   WBC 9.9 05/11/2018 0745   WBC 11.5 (H) 07/24/2016 1238   RBC 5.00 05/11/2018 0745   RBC 5.42 (H) 07/24/2016 1238   HGB 10.7 (L) 05/11/2018 0745  HCT 34.5 05/11/2018 0745   PLT 443 (H) 08/04/2017 1006   MCV 69 (L) 05/11/2018 0745   MCH 21.4 (L) 05/11/2018 0745   MCH 23.1 (L) 07/24/2016 1238   MCHC 31.0 (L) 05/11/2018 0745   MCHC 33.6 07/24/2016 1238   RDW 16.8 (H) 05/11/2018 0745   LYMPHSABS 2.0 05/11/2018 0745   MONOABS 0.5 06/04/2014 0828   EOSABS 0.3 05/11/2018 0745   BASOSABS 0.0 05/11/2018 0745   Iron/TIBC/Ferritin/ %Sat    Component Value Date/Time   FERRITIN 19 08/04/2017 1006   Lipid Panel     Component Value Date/Time   CHOL 173 05/11/2018 0745   TRIG 128 05/11/2018 0745   HDL 44 05/11/2018 0745   CHOLHDL 3.8 08/04/2017 1006   CHOLHDL 3.6 07/24/2016 1238   VLDL 27 07/24/2016 1238   LDLCALC 103 (H) 05/11/2018 0745   Hepatic Function Panel     Component Value Date/Time   PROT 6.7 05/11/2018 0745   ALBUMIN 4.4 05/11/2018 0745   AST 12 05/11/2018 0745   ALT 8 05/11/2018 0745   ALKPHOS 63 05/11/2018 0745   BILITOT <0.2 05/11/2018 0745      Component Value Date/Time   TSH 1.270 12/21/2017 0959   TSH 1.140 08/04/2017 1006   TSH 0.96 07/24/2016 1238   Results for LORAN, AUGUSTE (MRN [id number]) as of 08/31/2018 16:30  Ref. Range 05/11/2018 07:45  Vitamin D, 25-Hydroxy Latest Ref Range: 30.0 - 100.0 ng/mL 46.7   ASSESSMENT AND PLAN: Prediabetes - Plan: metFORMIN (GLUCOPHAGE) 500 MG tablet  Vitamin D deficiency - Plan: Vitamin D, Ergocalciferol, (DRISDOL) 50000 units CAPS capsule  Other iron deficiency anemia -  Plan: ferrous sulfate 325 (65 FE) MG tablet  Other depression - with emotional eating - Plan: buPROPion (WELLBUTRIN SR) 200 MG 12 hr tablet  At risk for diabetes mellitus  Class 3 severe obesity with serious comorbidity and body mass index (BMI) of 40.0 to 44.9 in adult, unspecified obesity type ([place])  PLAN:  Pre-Diabetes Angi will continue to work on weight loss, exercise, and decreasing simple carbohydrates in her diet to help decrease the risk of diabetes. We dicussed metformin including benefits and risks. She was informed that eating too many simple carbohydrates or too many calories at one sitting increases the likelihood of GI side effects.Anel agrees to continue taking Metformin 500 mg BID #60 and we will refill for one month. We will recheck labs at next visit.  Eyonna agrees follow up with our clinic in 3 weeks as directed to monitor her progress.  Diabetes risk counselling Kelci was given extended (15 minutes) diabetes prevention counseling today. She is 42 y.o. female and has risk factors for diabetes including obesity. We discussed intensive lifestyle modifications today with an emphasis on weight loss as well as increasing exercise and decreasing simple carbohydrates in her diet.  Vitamin D Deficiency Meila was informed that low vitamin D levels contributes to fatigue and are associated with obesity, breast, and colon cancer. Kerstin agrees to continue taking prescription Vit D @50 ,000 IU every week #4 and we will refill for 1 month. She will follow up for routine testing of vitamin D, at least 2-3 times per year.  She was informed of the risk of over-replacement of vitamin D and agrees to not increase her dose unless she discusses this with [place] first. Sativa agrees to follow up with our clinic in 3 weeks.   Iron Deficiency Anemia The diagnosis of Iron deficiency anemia was discussed with Pecola and was  explained in detail. She was given suggestions of iron rich foods. Ronnisha agrees to continue  taking FeSoy 325 mg qd #30 and we will refill for 1 month. We will recheck labs at next visit. Ellanie agrees to follow up with our clinic in 3 weeks.   Depression with Emotional Eating Behaviors We discussed behavior modification techniques today to help Shasta deal with her emotional eating and depression. Gabryella agrees to continue taking Wellbutrin SR 200 mg qd #30 and we will refill for 1 month.  Ione has agreed to consider going to therapy to help with the anxiety without using medications. Judine agrees to follow up with our clinic in 3 weeks.   Obesity Mazi is currently in the action stage of change. As such, her goal is to continue with weight loss efforts She has agreed to follow the Category 2 plan Misty has been instructed to work up to a goal of 150 minutes of combined cardio and strengthening exercise per week for weight loss and overall health benefits. We discussed the following Behavioral Modification Strategies today: increasing lean protein intake, decreasing simple carbohydrates , work on meal planning and easy cooking plans, emotional eating strategies and ways to avoid boredom eating   Delisia has agreed to follow up with our clinic in 3 weeks. She was informed of the importance of frequent follow up visits to maximize her success with intensive lifestyle modifications for her multiple health conditions.   OBESITY BEHAVIORAL INTERVENTION VISIT  Today's visit was # 14  Starting weight: 258 lbs Starting date: 12/21/2017 Today's weight: 226 lbs Today's date: 08/30/2018 Total lbs lost to date: 32 lbs At least 15 minutes were spent on discussing the following behavioral intervention visit.   ASK: We discussed the diagnosis of obesity with Cristie Hem today and Niharika agreed to give [place] permission to discuss obesity behavioral modification therapy today.  ASSESS: Cloria has the diagnosis of obesity and her BMI today is 41.33. Tulani is in the action stage of change   ADVISE: Loyda was  educated on the multiple health risks of obesity as well as the benefit of weight loss to improve her health. She was advised of the need for long term treatment and the importance of lifestyle modifications to improve her current health and to decrease her risk of future health problems.  AGREE: Multiple dietary modification options and treatment options were discussed and  Eisha agreed to follow the recommendations documented in the above note.  ARRANGE: Terryann was educated on the importance of frequent visits to treat obesity as outlined per CMS and USPSTF guidelines and agreed to schedule her next follow up appointment today.  I, Remi Deter, CMA, am acting as transcriptionist for Dennard Nip, MD  I have reviewed the above documentation for accuracy and completeness, and I agree with the above. -Dennard Nip, MD

## 2018-09-06 ENCOUNTER — Encounter (INDEPENDENT_AMBULATORY_CARE_PROVIDER_SITE_OTHER): Payer: Self-pay | Admitting: Family Medicine

## 2018-09-16 ENCOUNTER — Ambulatory Visit (INDEPENDENT_AMBULATORY_CARE_PROVIDER_SITE_OTHER): Payer: 59 | Admitting: Plastic Surgery

## 2018-09-16 ENCOUNTER — Encounter: Payer: Self-pay | Admitting: Plastic Surgery

## 2018-09-16 VITALS — BP 120/70 | HR 89 | Ht 62.0 in | Wt 235.0 lb

## 2018-09-16 DIAGNOSIS — N62 Hypertrophy of breast: Secondary | ICD-10-CM | POA: Diagnosis not present

## 2018-09-16 DIAGNOSIS — G8929 Other chronic pain: Secondary | ICD-10-CM

## 2018-09-16 DIAGNOSIS — M546 Pain in thoracic spine: Secondary | ICD-10-CM | POA: Diagnosis not present

## 2018-09-16 DIAGNOSIS — M542 Cervicalgia: Secondary | ICD-10-CM | POA: Diagnosis not present

## 2018-09-16 NOTE — Progress Notes (Signed)
Patient ID: Heidi Robinson, female    DOB: [birth year], 42 y.o.   MRN: [id number]   Chief Complaint  Patient presents with  . Breast Problem    Mammary Hyperplasia: The patient is a 42 y.o. female with a history of mammary hyperplasia for several years.  She has extremely large breasts causing symptoms that include the following: Back pain (upper and lower) and neck pain. She frequently pins bra cups higher on straps for better lift and relief. Notices relief when holding breast up in her hands. Shoulder straps causing grooves, pain occasionally requiring padding. Pain medication is sometimes required with motrin and tylenol.  Activities that are hindered by enlarged breasts include: running and exercise.  Her breasts are extremely large and fairly symmetric.  She has hyperpigmentation of the inframammary area on both sides.  The sternal to nipple distance on the right is 40 cm and the left is 40 cm.  The IMF distance is 19 cm.  She is 5 feet 2 inches tall and weighs 235 pounds.  Preoperative bra size = 44 G cup.  The estimated excess breast tissue to be removed at the time of surgery = 850 grams on the left and 850 grams on the right.  Mammogram history: one year ago and was negative.  Due for a mammogram next month.  She would like to be a C or D cup.   Review of Systems  Constitutional: Positive for activity change. Negative for appetite change.  HENT: Negative.   Eyes: Negative.   Respiratory: Negative.  Negative for shortness of breath.   Cardiovascular: Negative.   Endocrine: Negative.   Genitourinary: Negative.   Musculoskeletal: Positive for back pain and neck pain.  Skin: Negative for color change and wound.  Hematological: Negative.   Psychiatric/Behavioral: Negative.     Past Medical History:  Diagnosis Date  . Allergy   . Anemia   . Arthritis   . Asthma   . Fibroids   . Hypertension   . Iron deficiency anemia due to chronic blood loss   . Obesity (BMI 30-39.9)  02/02/2012  . Sickle cell trait ([place]) 06/04/2014   hemoglobin electrophoresis confirmed    Past Surgical History:  Procedure Laterality Date  . tonsillecomy    . TONSILLECTOMY  1997      Current Outpatient Medications:  .  buPROPion (WELLBUTRIN SR) 200 MG 12 hr tablet, Take 1 tablet (200 mg total) by mouth daily., Disp: 30 tablet, Rfl: 0 .  ferrous sulfate 325 (65 FE) MG tablet, Take 1 tablet (325 mg total) by mouth daily with breakfast., Disp: 30 tablet, Rfl: 0 .  lisinopril-hydrochlorothiazide (PRINZIDE,ZESTORETIC) 10-12.5 MG tablet, TAKE 1 TABLET BY MOUTH ONCE DAILY, Disp: 30 tablet, Rfl: 11 .  meloxicam (MOBIC) 15 MG tablet, Take 1 tablet (15 mg total) by mouth daily as needed for pain., Disp: 30 tablet, Rfl: 5 .  metFORMIN (GLUCOPHAGE) 500 MG tablet, Take 1 tablet (500 mg total) by mouth 2 (two) times daily with a meal., Disp: 60 tablet, Rfl: 0 .  Multiple Vitamin (MULTIVITAMINS PO), Take by mouth., Disp: , Rfl:  .  polyethylene glycol powder (GLYCOLAX/MIRALAX) powder, Take 17 g by mouth daily., Disp: 3350 g, Rfl: 0 .  Vitamin D, Ergocalciferol, (DRISDOL) 50000 units CAPS capsule, Take 1 capsule (50,000 Units total) by mouth every 7 (seven) days., Disp: 4 capsule, Rfl: 0   Objective:   Vitals:   09/16/18 1401  BP: 120/70  Pulse: 89  SpO2:  98%    Physical Exam  Constitutional: She is oriented to person, place, and time. She appears well-developed and well-nourished.  HENT:  Head: Normocephalic and atraumatic.  Eyes: Pupils are equal, round, and reactive to light. EOM are normal.  Cardiovascular: Normal rate.  Pulmonary/Chest: Effort normal. No respiratory distress.  Abdominal: Soft. There is no guarding.  Neurological: She is alert and oriented to person, place, and time.  Skin: Skin is warm.  Psychiatric: She has a normal mood and affect. Her behavior is normal. Judgment and thought content normal.    Assessment & Plan:  Chronic bilateral thoracic back pain  Neck  pain  Symptomatic mammary hypertrophy  Recommend bilateral breast reduction.  Will likely need an inferior pedicle technique and even a amputation technique which requires a skin graft of the NAC.  [place], DO

## 2018-09-21 ENCOUNTER — Ambulatory Visit (INDEPENDENT_AMBULATORY_CARE_PROVIDER_SITE_OTHER): Payer: 59 | Admitting: Family Medicine

## 2018-09-21 VITALS — BP 131/93 | HR 79 | Temp 97.9°F | Ht 62.0 in | Wt 226.0 lb

## 2018-09-21 DIAGNOSIS — E559 Vitamin D deficiency, unspecified: Secondary | ICD-10-CM | POA: Diagnosis not present

## 2018-09-21 DIAGNOSIS — D508 Other iron deficiency anemias: Secondary | ICD-10-CM

## 2018-09-21 DIAGNOSIS — R7303 Prediabetes: Secondary | ICD-10-CM

## 2018-09-21 DIAGNOSIS — Z6841 Body Mass Index (BMI) 40.0 and over, adult: Secondary | ICD-10-CM

## 2018-09-21 DIAGNOSIS — F3289 Other specified depressive episodes: Secondary | ICD-10-CM | POA: Diagnosis not present

## 2018-09-21 DIAGNOSIS — Z9189 Other specified personal risk factors, not elsewhere classified: Secondary | ICD-10-CM

## 2018-09-21 MED ORDER — FERROUS SULFATE 325 (65 FE) MG PO TABS: 325.0000 mg | ORAL_TABLET | Freq: Every day | ORAL | 0 refills | Status: DC

## 2018-09-21 MED ORDER — METFORMIN HCL 500 MG PO TABS: 500.0000 mg | ORAL_TABLET | Freq: Two times a day (BID) | ORAL | 0 refills | Status: DC

## 2018-09-21 MED ORDER — BUPROPION HCL ER (SR) 200 MG PO TB12: 200.0000 mg | ORAL_TABLET | Freq: Every day | ORAL | 0 refills | Status: DC

## 2018-09-21 MED ORDER — VITAMIN D (ERGOCALCIFEROL) 1.25 MG (50000 UNIT) PO CAPS: 50000.0000 [IU] | ORAL_CAPSULE | ORAL | 0 refills | Status: DC

## 2018-09-26 NOTE — Progress Notes (Signed)
Office: [phone]  /  Fax: [phone]   HPI:   Chief Complaint: OBESITY Heidi Robinson is here to discuss her progress with her obesity treatment plan. She is on the Category 2 plan and is following her eating plan approximately 25 % of the time. She states she is exercising 0 minutes 0 times per week. Heidi Robinson has done well with maintaining weight over Halloween, but is worried about Thanksgiving weight gain. Overall her hunger is controlled.  Her weight is 226 lb (102.5 kg) today and has not lost weight since her last visit. She has lost 32 lbs since starting treatment with [place].  Pre-Diabetes Heidi Robinson has a diagnosis of pre-diabetes based on her elevated Hgb A1c and was informed this puts her at greater risk of developing diabetes. She is stable on metformin and denies nausea, vomiting, or hypoglycemia. She continues to work on diet and exercise to decrease risk of diabetes.   At risk for diabetes Heidi Robinson is at higher than average risk for developing diabetes due to her obesity and pre-diabetes. She currently denies polyuria or polydipsia.  Vitamin D Deficiency Heidi Robinson has a diagnosis of vitamin D deficiency. She is stable on prescription Vit D, but level is not yet at goal. She denies nausea, vomiting or muscle weakness.  Iron Deficiency Anemia Heidi Robinson has a diagnosis of anemia.  She is stable on iron supplementation and iron rich diet.   Depression with emotional eating behaviors Heidi Robinson's mood is stable on Wellbutrin. She notes decreased emotional eating overall, but anticipates increased stress over the holidays. Heidi Robinson struggles with emotional eating and using food for comfort to the extent that it is negatively impacting her health. She often snacks when she is not hungry. Heidi Robinson sometimes feels she is out of control and then feels guilty that she made poor food choices. She has been working on behavior modification techniques to help reduce her emotional eating and has been somewhat successful. She shows no  sign of suicidal or homicidal ideations.  Depression screen Heidi Robinson 2/9 08/06/2018 12/21/2017 11/06/2017 08/04/2017 07/24/2016  Decreased Interest 0 3 0 0 0  Down, Depressed, Hopeless 0 3 0 0 0  PHQ - 2 Score 0 6 0 0 0  Altered sleeping - 2 - - -  Tired, decreased energy - 3 - - -  Change in appetite - 2 - - -  Feeling bad or failure about yourself  - 2 - - -  Trouble concentrating - 3 - - -  Moving slowly or fidgety/restless - 2 - - -  Suicidal thoughts - 0 - - -  PHQ-9 Score - 20 - - -  Difficult doing work/chores - Somewhat difficult - - -    ALLERGIES: Allergies  Allergen Reactions  . Penicillins Anaphylaxis  . Food     Fish, Walnuts, Cashews, Almonds, Pecans, Blueberries, Pine nuts, Pistachios, Macadamia Nuts, Coconuts, Chestnuts    MEDICATIONS: Current Outpatient Medications on File Prior to Visit  Medication Sig Dispense Refill  . lisinopril-hydrochlorothiazide (PRINZIDE,ZESTORETIC) 10-12.5 MG tablet TAKE 1 TABLET BY MOUTH ONCE DAILY 30 tablet 11  . meloxicam (MOBIC) 15 MG tablet Take 1 tablet (15 mg total) by mouth daily as needed for pain. 30 tablet 5  . Multiple Vitamin (MULTIVITAMINS PO) Take by mouth.    . polyethylene glycol powder (GLYCOLAX/MIRALAX) powder Take 17 g by mouth daily. 3350 g 0   No current facility-administered medications on file prior to visit.     PAST MEDICAL HISTORY: Past Medical History:  Diagnosis Date  .  Allergy   . Anemia   . Arthritis   . Asthma   . Fibroids   . Hypertension   . Iron deficiency anemia due to chronic blood loss   . Obesity (BMI 30-39.9) 02/02/2012  . Sickle cell trait ([place]) 06/04/2014   hemoglobin electrophoresis confirmed    PAST SURGICAL HISTORY: Past Surgical History:  Procedure Laterality Date  . tonsillecomy    . TONSILLECTOMY  1997    SOCIAL HISTORY: Social History   Tobacco Use  . Smoking status: Former Smoker    Years: 2.00    Types: Cigarettes    Last attempt to quit: 07/11/2003    Years since  quitting: 15.2  . Smokeless tobacco: Never Used  Substance Use Topics  . Alcohol use: No    Alcohol/week: 0.0 standard drinks  . Drug use: No    FAMILY HISTORY: Family History  Problem Relation Age of Onset  . Hypertension Mother   . Anxiety disorder Mother   . Obesity Mother   . Kidney disease Brother   . Hypertension Maternal Grandmother   . Cancer Maternal Grandmother        bladder  . Hyperlipidemia Father   . Cancer Paternal Grandfather        prostate  . Breast cancer Neg Hx     ROS: Review of Systems  Constitutional: Negative for weight loss.  Gastrointestinal: Negative for nausea and vomiting.  Genitourinary: Negative for frequency.  Musculoskeletal:       Negative muscle weakness  Endo/Heme/Allergies: Negative for polydipsia.  Psychiatric/Behavioral: Positive for depression. Negative for suicidal ideas.    PHYSICAL EXAM: Blood pressure (!) 131/93, pulse 79, temperature 97.9 F (36.6 C), temperature source Oral, height 5\' 2"  (1.575 m), weight 226 lb (102.5 kg), last menstrual period 09/14/2018, SpO2 100 %. Body mass index is 41.34 kg/m. Physical Exam  Constitutional: She is oriented to person, place, and time. She appears well-developed and well-nourished.  Cardiovascular: Normal rate.  Pulmonary/Chest: Effort normal.  Musculoskeletal: Normal range of motion.  Neurological: She is oriented to person, place, and time.  Skin: Skin is warm and dry.  Psychiatric: She has a normal mood and affect. Her behavior is normal.  Vitals reviewed.   RECENT LABS AND TESTS: BMET    Component Value Date/Time   NA 139 05/11/2018 0745   K 4.3 05/11/2018 0745   CL 102 05/11/2018 0745   CO2 22 05/11/2018 0745   GLUCOSE 87 05/11/2018 0745   GLUCOSE 82 07/24/2016 1238   BUN 13 05/11/2018 0745   CREATININE 0.78 05/11/2018 0745   CREATININE 0.80 07/24/2016 1238   CALCIUM 9.7 05/11/2018 0745   GFRNONAA 95 05/11/2018 0745   GFRAA 109 05/11/2018 0745   Lab Results    Component Value Date   HGBA1C 5.2 05/11/2018   HGBA1C 5.7 (H) 12/21/2017   HGBA1C 5.2 08/04/2017   HGBA1C 5.3 07/24/2016   HGBA1C 5.6 07/31/2015   Lab Results  Component Value Date   INSULIN 16.7 05/11/2018   INSULIN 23.4 12/21/2017   CBC    Component Value Date/Time   WBC 9.9 05/11/2018 0745   WBC 11.5 (H) 07/24/2016 1238   RBC 5.00 05/11/2018 0745   RBC 5.42 (H) 07/24/2016 1238   HGB 10.7 (L) 05/11/2018 0745   HCT 34.5 05/11/2018 0745   PLT 443 (H) 08/04/2017 1006   MCV 69 (L) 05/11/2018 0745   MCH 21.4 (L) 05/11/2018 0745   MCH 23.1 (L) 07/24/2016 1238   MCHC 31.0 (L) 05/11/2018  0745   MCHC 33.6 07/24/2016 1238   RDW 16.8 (H) 05/11/2018 0745   LYMPHSABS 2.0 05/11/2018 0745   MONOABS 0.5 06/04/2014 0828   EOSABS 0.3 05/11/2018 0745   BASOSABS 0.0 05/11/2018 0745   Iron/TIBC/Ferritin/ %Sat    Component Value Date/Time   FERRITIN 19 08/04/2017 1006   Lipid Panel     Component Value Date/Time   CHOL 173 05/11/2018 0745   TRIG 128 05/11/2018 0745   HDL 44 05/11/2018 0745   CHOLHDL 3.8 08/04/2017 1006   CHOLHDL 3.6 07/24/2016 1238   VLDL 27 07/24/2016 1238   LDLCALC 103 (H) 05/11/2018 0745   Hepatic Function Panel     Component Value Date/Time   PROT 6.7 05/11/2018 0745   ALBUMIN 4.4 05/11/2018 0745   AST 12 05/11/2018 0745   ALT 8 05/11/2018 0745   ALKPHOS 63 05/11/2018 0745   BILITOT <0.2 05/11/2018 0745      Component Value Date/Time   TSH 1.270 12/21/2017 0959   TSH 1.140 08/04/2017 1006   TSH 0.96 07/24/2016 1238  Results for BELITA, WARSAME (MRN [id number]) as of 09/26/2018 07:53  Ref. Range 05/11/2018 07:45  Vitamin D, 25-Hydroxy Latest Ref Range: 30.0 - 100.0 ng/mL 46.7    ASSESSMENT AND PLAN: Prediabetes - Plan: metFORMIN (GLUCOPHAGE) 500 MG tablet  Vitamin D deficiency - Plan: Vitamin D, Ergocalciferol, (DRISDOL) 1.25 MG (50000 UT) CAPS capsule  Other iron deficiency anemia - Plan: ferrous sulfate 325 (65 FE) MG tablet  Other depression -  with emotional eating - Plan: buPROPion (WELLBUTRIN SR) 200 MG 12 hr tablet  At risk for diabetes mellitus  Class 3 severe obesity with serious comorbidity and body mass index (BMI) of 40.0 to 44.9 in adult, unspecified obesity type ([place])  PLAN:  Pre-Diabetes Auna will continue to work on weight loss, exercise, and decreasing simple carbohydrates in her diet to help decrease the risk of diabetes. We dicussed metformin including benefits and risks. She was informed that eating too many simple carbohydrates or too many calories at one sitting increases the likelihood of GI side effects. Maniah agrees to continue taking metformin 500 mg BID #60 and we will refill for 1 month. Heidi Robinson agrees to follow up with our clinic in 3 weeks as directed to monitor her progress.  Diabetes risk counselling Heidi Robinson was given extended (15 minutes) diabetes prevention counseling today. She is 42 y.o. female and has risk factors for diabetes including obesity and pre-diabetes. We discussed intensive lifestyle modifications today with an emphasis on weight loss as well as increasing exercise and decreasing simple carbohydrates in her diet.  Vitamin D Deficiency Heidi Robinson was informed that low vitamin D levels contributes to fatigue and are associated with obesity, breast, and colon cancer. Heidi Robinson agrees to continue taking prescription Vit D @50 ,000 IU every week #4 and we will refill for 1 month. She will follow up for routine testing of vitamin D, at least 2-3 times per year. She was informed of the risk of over-replacement of vitamin D and agrees to not increase her dose unless she discusses this with [place] first. Heidi Robinson agrees to follow up with our clinic in 3 weeks.  Iron Deficiency Anemia The diagnosis of Iron deficiency anemia was discussed with Heidi Robinson and was explained in detail. She was given suggestions of iron rich foods. Heidi Robinson agrees to continue taking FeSoy 325 mg q AM #30 and we will refill for 1 month. Heidi Robinson agrees to follow up  with our clinic in 3 weeks.  Depression with Emotional Eating Behaviors We discussed behavior modification techniques today to help Heidi Robinson deal with her emotional eating and depression. Heidi Robinson agrees to continue taking Wellbutrin SR 200 mg qd #30 and we will refill for 1 month. Heidi Robinson agrees to follow up with our clinic in 3 weeks.  Obesity Heidi Robinson is currently in the action stage of change. As such, her goal is to continue with weight loss efforts She has agreed to follow the Category 2 plan Heidi Robinson has been instructed to work up to a goal of 150 minutes of combined cardio and strengthening exercise per week for weight loss and overall health benefits. We discussed the following Behavioral Modification Strategies today: increasing lean protein intake, decreasing simple carbohydrates, work on meal planning and easy cooking plans, holiday eating strategies, emotional eating strategies, and travel eating strategies   Shaily has agreed to follow up with our clinic in 3 weeks. She was informed of the importance of frequent follow up visits to maximize her success with intensive lifestyle modifications for her multiple health conditions.   OBESITY BEHAVIORAL INTERVENTION VISIT  Today's visit was # 15   Starting weight: 258 lbs Starting date: 12/21/17 Today's weight : 226 lbs  Today's date: 09/21/2018 Total lbs lost to date: 3    ASK: We discussed the diagnosis of obesity with Cristie Hem today and Rayola agreed to give [place] permission to discuss obesity behavioral modification therapy today.  ASSESS: Jauna has the diagnosis of obesity and her BMI today is 41.33 Isebella is in the action stage of change   ADVISE: Cailey was educated on the multiple health risks of obesity as well as the benefit of weight loss to improve her health. She was advised of the need for long term treatment and the importance of lifestyle modifications to improve her current health and to decrease her risk of future health  problems.  AGREE: Multiple dietary modification options and treatment options were discussed and  Serenitie agreed to follow the recommendations documented in the above note.  ARRANGE: Karen was educated on the importance of frequent visits to treat obesity as outlined per CMS and USPSTF guidelines and agreed to schedule her next follow up appointment today.  I, Trixie Dredge, am acting as transcriptionist for Dennard Nip, MD  I have reviewed the above documentation for accuracy and completeness, and I agree with the above. -Dennard Nip, MD

## 2018-10-19 ENCOUNTER — Ambulatory Visit (INDEPENDENT_AMBULATORY_CARE_PROVIDER_SITE_OTHER): Payer: 59 | Admitting: Family Medicine

## 2018-10-19 VITALS — BP 122/83 | HR 79 | Ht 62.0 in | Wt 225.0 lb

## 2018-10-19 DIAGNOSIS — F3289 Other specified depressive episodes: Secondary | ICD-10-CM | POA: Diagnosis not present

## 2018-10-19 DIAGNOSIS — R7303 Prediabetes: Secondary | ICD-10-CM

## 2018-10-19 DIAGNOSIS — E559 Vitamin D deficiency, unspecified: Secondary | ICD-10-CM

## 2018-10-19 DIAGNOSIS — E66813 Obesity, class 3: Secondary | ICD-10-CM

## 2018-10-19 DIAGNOSIS — Z6841 Body Mass Index (BMI) 40.0 and over, adult: Secondary | ICD-10-CM

## 2018-10-19 DIAGNOSIS — D508 Other iron deficiency anemias: Secondary | ICD-10-CM | POA: Diagnosis not present

## 2018-10-19 DIAGNOSIS — Z9189 Other specified personal risk factors, not elsewhere classified: Secondary | ICD-10-CM

## 2018-10-20 ENCOUNTER — Encounter (INDEPENDENT_AMBULATORY_CARE_PROVIDER_SITE_OTHER): Payer: Self-pay | Admitting: Family Medicine

## 2018-10-20 MED ORDER — FERROUS SULFATE 325 (65 FE) MG PO TABS: 325.0000 mg | ORAL_TABLET | Freq: Every day | ORAL | 0 refills | Status: DC

## 2018-10-20 MED ORDER — BUPROPION HCL ER (SR) 200 MG PO TB12: 200.0000 mg | ORAL_TABLET | Freq: Every day | ORAL | 0 refills | Status: DC

## 2018-10-20 MED ORDER — VITAMIN D (ERGOCALCIFEROL) 1.25 MG (50000 UNIT) PO CAPS: 50000.0000 [IU] | ORAL_CAPSULE | ORAL | 0 refills | Status: DC

## 2018-10-20 MED ORDER — METFORMIN HCL 500 MG PO TABS: 500.0000 mg | ORAL_TABLET | Freq: Two times a day (BID) | ORAL | 0 refills | Status: DC

## 2018-10-20 NOTE — Progress Notes (Signed)
Office: [phone]  /  Fax: [phone]   HPI:   Chief Complaint: OBESITY Heidi Robinson is here to discuss her progress with her obesity treatment plan. She is on the Category 2 plan and is following her eating plan approximately 10 % of the time. She states she is exercising 0 minutes 0 times per week. Heidi Robinson has done well with maintaining her weight loss and even losing 1 pound over Thanksgiving. She wasn't able to follow her plan closely, but did well with portion control and smarter choices.  Her weight is 225 lb (102.1 kg) today and has had a weight loss of 1 pound over a period of 4 weeks since her last visit. She has lost 33 lbs since starting treatment with [place].  Depression with emotional eating behaviors Heidi Robinson is struggling with emotional eating and using food for comfort to the extent that it is negatively impacting her health. She often snacks when she is not hungry. Heidi Robinson sometimes feels she is out of control and then feels guilty that she made poor food choices. She has been working on behavior modification techniques to help reduce her emotional eating and has been somewhat successful. Her mood is stable on Wellbutrin and her blood pressure is stable. She denies insomnia.  Vitamin D deficiency Heidi Robinson has a diagnosis of vitamin D deficiency. She is currently taking vit D and is stable. He denies nausea, vomiting, or muscle weakness. She is due for labs.  Anemia Heidi Robinson has a diagnosis of anemia.  She is stable on FeSO4 iron supplementation and has increased iron in her diet. She is due for labs. Heidi Robinson denies lightheadedness and palpitations.  Pre-Diabetes Heidi Robinson has a diagnosis of pre-diabetes based on her elevated Hgb A1c and was informed this puts her at greater risk of developing diabetes. She is taking metformin and is stable. She is doing well on her diet prescription and is due for labs. She continues to work on diet and exercise to decrease risk of diabetes. She denies nausea, vomiting, or  hypoglycemia.  ALLERGIES: Allergies  Allergen Reactions  . Penicillins Anaphylaxis  . Food     Fish, Walnuts, Cashews, Almonds, Pecans, Blueberries, Pine nuts, Pistachios, Macadamia Nuts, Coconuts, Chestnuts    MEDICATIONS: Current Outpatient Medications on File Prior to Visit  Medication Sig Dispense Refill  . lisinopril-hydrochlorothiazide (PRINZIDE,ZESTORETIC) 10-12.5 MG tablet TAKE 1 TABLET BY MOUTH ONCE DAILY 30 tablet 11  . meloxicam (MOBIC) 15 MG tablet Take 1 tablet (15 mg total) by mouth daily as needed for pain. 30 tablet 5  . Multiple Vitamin (MULTIVITAMINS PO) Take by mouth.    . polyethylene glycol powder (GLYCOLAX/MIRALAX) powder Take 17 g by mouth daily. 3350 g 0   No current facility-administered medications on file prior to visit.     PAST MEDICAL HISTORY: Past Medical History:  Diagnosis Date  . Allergy   . Anemia   . Arthritis   . Asthma   . Fibroids   . Hypertension   . Iron deficiency anemia due to chronic blood loss   . Obesity (BMI 30-39.9) 02/02/2012  . Sickle cell trait ([place]) 06/04/2014   hemoglobin electrophoresis confirmed    PAST SURGICAL HISTORY: Past Surgical History:  Procedure Laterality Date  . tonsillecomy    . TONSILLECTOMY  1997    SOCIAL HISTORY: Social History   Tobacco Use  . Smoking status: Former Smoker    Years: 2.00    Types: Cigarettes    Last attempt to quit: 07/11/2003  Years since quitting: 15.2  . Smokeless tobacco: Never Used  Substance Use Topics  . Alcohol use: No    Alcohol/week: 0.0 standard drinks  . Drug use: No    FAMILY HISTORY: Family History  Problem Relation Age of Onset  . Hypertension Mother   . Anxiety disorder Mother   . Obesity Mother   . Kidney disease Brother   . Hypertension Maternal Grandmother   . Cancer Maternal Grandmother        bladder  . Hyperlipidemia Father   . Cancer Paternal Grandfather        prostate  . Breast cancer Neg Hx     ROS: Review of Systems    Constitutional: Positive for weight loss.  Cardiovascular: Negative for palpitations.  Gastrointestinal: Negative for nausea and vomiting.  Musculoskeletal:       Negative for muscle weakness.  Neurological:       Negative for lightheadedness.  Endo/Heme/Allergies:       Negative for hypoglycemia.  Psychiatric/Behavioral: Positive for depression.    PHYSICAL EXAM: Blood pressure 122/83, pulse 79, height 5\' 2"  (1.575 m), weight 225 lb (102.1 kg), SpO2 100 %. Body mass index is 41.15 kg/m. Physical Exam Vitals signs reviewed.  Constitutional:      Appearance: Normal appearance. She is obese.  Cardiovascular:     Rate and Rhythm: Normal rate.  Pulmonary:     Effort: Pulmonary effort is normal.  Musculoskeletal: Normal range of motion.  Skin:    General: Skin is warm and dry.  Neurological:     Mental Status: She is alert and oriented to person, place, and time.  Psychiatric:        Mood and Affect: Mood normal.        Behavior: Behavior normal.     RECENT LABS AND TESTS: BMET    Component Value Date/Time   NA 139 05/11/2018 0745   K 4.3 05/11/2018 0745   CL 102 05/11/2018 0745   CO2 22 05/11/2018 0745   GLUCOSE 87 05/11/2018 0745   GLUCOSE 82 07/24/2016 1238   BUN 13 05/11/2018 0745   CREATININE 0.78 05/11/2018 0745   CREATININE 0.80 07/24/2016 1238   CALCIUM 9.7 05/11/2018 0745   GFRNONAA 95 05/11/2018 0745   GFRAA 109 05/11/2018 0745   Lab Results  Component Value Date   HGBA1C 5.2 05/11/2018   HGBA1C 5.7 (H) 12/21/2017   HGBA1C 5.2 08/04/2017   HGBA1C 5.3 07/24/2016   HGBA1C 5.6 07/31/2015   Lab Results  Component Value Date   INSULIN 16.7 05/11/2018   INSULIN 23.4 12/21/2017   CBC    Component Value Date/Time   WBC 9.9 05/11/2018 0745   WBC 11.5 (H) 07/24/2016 1238   RBC 5.00 05/11/2018 0745   RBC 5.42 (H) 07/24/2016 1238   HGB 10.7 (L) 05/11/2018 0745   HCT 34.5 05/11/2018 0745   PLT 443 (H) 08/04/2017 1006   MCV 69 (L) 05/11/2018 0745    MCH 21.4 (L) 05/11/2018 0745   MCH 23.1 (L) 07/24/2016 1238   MCHC 31.0 (L) 05/11/2018 0745   MCHC 33.6 07/24/2016 1238   RDW 16.8 (H) 05/11/2018 0745   LYMPHSABS 2.0 05/11/2018 0745   MONOABS 0.5 06/04/2014 0828   EOSABS 0.3 05/11/2018 0745   BASOSABS 0.0 05/11/2018 0745   Iron/TIBC/Ferritin/ %Sat    Component Value Date/Time   FERRITIN 19 08/04/2017 1006   Lipid Panel     Component Value Date/Time   CHOL 173 05/11/2018 0745   TRIG  128 05/11/2018 0745   HDL 44 05/11/2018 0745   CHOLHDL 3.8 08/04/2017 1006   CHOLHDL 3.6 07/24/2016 1238   VLDL 27 07/24/2016 1238   LDLCALC 103 (H) 05/11/2018 0745   Hepatic Function Panel     Component Value Date/Time   PROT 6.7 05/11/2018 0745   ALBUMIN 4.4 05/11/2018 0745   AST 12 05/11/2018 0745   ALT 8 05/11/2018 0745   ALKPHOS 63 05/11/2018 0745   BILITOT <0.2 05/11/2018 0745      Component Value Date/Time   TSH 1.270 12/21/2017 0959   TSH 1.140 08/04/2017 1006   TSH 0.96 07/24/2016 1238   Results for CHANCEY, CULLINANE (MRN [id number]) as of 10/20/2018 09:53  Ref. Range 05/11/2018 07:45  Vitamin D, 25-Hydroxy Latest Ref Range: 30.0 - 100.0 ng/mL 46.7   ASSESSMENT AND PLAN: Vitamin D deficiency - Plan: VITAMIN D 25 Hydroxy (Vit-D Deficiency, Fractures), Vitamin D, Ergocalciferol, (DRISDOL) 1.25 MG (50000 UT) CAPS capsule, DISCONTINUED: Vitamin D, Ergocalciferol, (DRISDOL) 1.25 MG (50000 UT) CAPS capsule  Other iron deficiency anemia - Plan: CBC with Differential/Platelet, Lipid panel, ferrous sulfate 325 (65 FE) MG tablet, DISCONTINUED: ferrous sulfate 325 (65 FE) MG tablet  Prediabetes - Plan: Comprehensive metabolic panel, Hemoglobin A1c, Insulin, random, Lipid panel, metFORMIN (GLUCOPHAGE) 500 MG tablet, DISCONTINUED: metFORMIN (GLUCOPHAGE) 500 MG tablet  Other depression - with emotional eating - Plan: buPROPion (WELLBUTRIN SR) 200 MG 12 hr tablet, DISCONTINUED: buPROPion (WELLBUTRIN SR) 200 MG 12 hr tablet  At risk for diabetes  mellitus  Class 3 severe obesity with serious comorbidity and body mass index (BMI) of 40.0 to 44.9 in adult, unspecified obesity type ([facility])  PLAN:  Anemia The diagnosis of Iron deficiency anemia was discussed with Jaquayla and was explained in detail. She was given suggestions of iron rich foods and she agreed to continue ferrous sulfate 325mg  with breakfast qd #30 with no refills as prescribed. Labs will be checked before her next appointment. Heidi Robinson agrees to follow up as directed in 4 to 5 weeks.  Pre-Diabetes Heidi Robinson will continue to work on weight loss, exercise, and decreasing simple carbohydrates in her diet to help decrease the risk of diabetes.She was informed that eating too many simple carbohydrates or too many calories at one sitting increases the likelihood of GI side effects. Heidi Robinson agreed to continue metformin 500mg  BID with a meal #60 with no refills and a prescription was written today. Heidi Robinson agreed to follow up with [place] as directed to monitor her progress.  Depression with Emotional Eating Behaviors We discussed behavior modification techniques today to help Heidi Robinson deal with her emotional eating and depression. She has agreed to take Wellbutrin SR 200mg  qd #30 with no refills and agreed to follow up as directed in 4 to 5 weeks.  Obesity Heidi Robinson is currently in the action stage of change. As such, her goal is to maintain weight over the holidays. She has agreed to portion control better and make smarter food choices, such as increase vegetables and decrease simple carbohydrates.  Heidi Robinson has been instructed to work up to a goal of 150 minutes of combined cardio and strengthening exercise per week for weight loss and overall health benefits. We discussed the following Behavioral Modification Strategies today: increasing lean protein intake, decreasing simple carbohydrates, holiday eating strategies, and celebration eating strategies.  Heidi Robinson has agreed to follow up with our clinic in 4 to 5 weeks.  She was informed of the importance of frequent follow up visits to maximize her success with intensive  lifestyle modifications for her multiple health conditions.   OBESITY BEHAVIORAL INTERVENTION VISIT  Today's visit was # 16   Starting weight: 258 lbs Starting date: 12/21/17 Today's weight : Weight: 225 lb (102.1 kg)  Today's date: 10/19/2018 Total lbs lost to date: 90  ASK: We discussed the diagnosis of obesity with Heidi Robinson today and Heidi Robinson agreed to give [place] permission to discuss obesity behavioral modification therapy today.  ASSESS: Heidi Robinson has the diagnosis of obesity and her BMI today is 41.1. Heidi Robinson is in the action stage of change.   ADVISE: Heidi Robinson was educated on the multiple health risks of obesity as well as the benefit of weight loss to improve her health. She was advised of the need for long term treatment and the importance of lifestyle modifications to improve her current health and to decrease her risk of future health problems.  AGREE: Multiple dietary modification options and treatment options were discussed and Heidi Robinson agreed to follow the recommendations documented in the above note.  ARRANGE: Heidi Robinson was educated on the importance of frequent visits to treat obesity as outlined per CMS and USPSTF guidelines and agreed to schedule her next follow up appointment today.  I, Marcille Blanco, am acting as transcriptionist for Starlyn Skeans, MD  I have reviewed the above documentation for accuracy and completeness, and I agree with the above. -Dennard Nip, MD

## 2018-10-21 ENCOUNTER — Other Ambulatory Visit: Payer: Self-pay | Admitting: Family Medicine

## 2018-10-21 ENCOUNTER — Ambulatory Visit
Admission: RE | Admit: 2018-10-21 | Discharge: 2018-10-21 | Disposition: A | Discharge status: Home / Self Care | Payer: 59 | Source: Ambulatory Visit | Attending: Family Medicine | Admitting: Family Medicine

## 2018-10-21 DIAGNOSIS — Z1239 Encounter for other screening for malignant neoplasm of breast: Secondary | ICD-10-CM

## 2018-10-21 DIAGNOSIS — Z1231 Encounter for screening mammogram for malignant neoplasm of breast: Secondary | ICD-10-CM

## 2018-10-24 ENCOUNTER — Ambulatory Visit: Payer: 59

## 2018-10-25 ENCOUNTER — Other Ambulatory Visit: Payer: Self-pay

## 2018-10-28 ENCOUNTER — Ambulatory Visit (INDEPENDENT_AMBULATORY_CARE_PROVIDER_SITE_OTHER): Payer: 59 | Admitting: Plastic Surgery

## 2018-10-28 ENCOUNTER — Encounter: Payer: 59 | Admitting: Plastic Surgery

## 2018-10-28 ENCOUNTER — Encounter: Payer: Self-pay | Admitting: Plastic Surgery

## 2018-10-28 VITALS — BP 110/84 | HR 93 | Ht 62.0 in | Wt 225.0 lb

## 2018-10-28 DIAGNOSIS — M542 Cervicalgia: Secondary | ICD-10-CM

## 2018-10-28 DIAGNOSIS — N62 Hypertrophy of breast: Secondary | ICD-10-CM

## 2018-10-28 MED ORDER — CIPROFLOXACIN HCL 500 MG PO TABS: 500.0000 mg | ORAL_TABLET | Freq: Two times a day (BID) | ORAL | 0 refills | Status: AC

## 2018-10-28 MED ORDER — ONDANSETRON HCL 4 MG PO TABS: 4.0000 mg | ORAL_TABLET | Freq: Three times a day (TID) | ORAL | 0 refills | Status: AC | PRN

## 2018-10-28 MED ORDER — HYDROCODONE-ACETAMINOPHEN 5-325 MG PO TABS: 2.0000 | ORAL_TABLET | Freq: Four times a day (QID) | ORAL | 0 refills | Status: AC | PRN

## 2018-10-28 NOTE — H&P (View-Only) (Signed)
Patient ID: Heidi Robinson, female    DOB: Feb 26, 1976, 42 y.o.   MRN: [id number]   Chief Complaint  Patient presents with  . Pre-op Exam    for breast reduction    Heidi Robinson is a 42 year old black female here for her history and physical for bilateral breast reduction.  She was evaluated a year ago but had to postpone her surgery and has now been reapproved and is ready for the surgery.  She is a little bit nervous today but knows she needs to and wants to have this done.  She is 225 pounds.  She is 5 feet 2 inches tall.  Her preop bra is 44 G, she would like to be a small as possible. We have estimated that we will get off at least 850 g of both breasts.  She has not had any recent illnesses.  Her MM from last week was negative.    Review of Systems  Constitutional: Negative.   HENT: Negative.   Eyes: Negative.   Respiratory: Negative.   Cardiovascular: Negative.   Gastrointestinal: Negative.   Endocrine: Negative.   Genitourinary: Negative.   Musculoskeletal: Positive for back pain and neck pain.  Skin: Negative.   Hematological: Negative.   Psychiatric/Behavioral: Negative.     Past Medical History:  Diagnosis Date  . Allergy   . Anemia   . Arthritis   . Asthma   . Fibroids   . Hypertension   . Iron deficiency anemia due to chronic blood loss   . Obesity (BMI 30-39.9) 02/02/2012  . Sickle cell trait ([place]) 06/04/2014   hemoglobin electrophoresis confirmed    Past Surgical History:  Procedure Laterality Date  . tonsillecomy    . TONSILLECTOMY  1997      Current Outpatient Medications:  .  albuterol (PROVENTIL) (2.5 MG/3ML) 0.083% nebulizer solution, Inhale 2.5 mg into the lungs every 6 (six) hours as needed for wheezing or shortness of breath., Disp: , Rfl:  .  buPROPion (WELLBUTRIN SR) 200 MG 12 hr tablet, Take 1 tablet (200 mg total) by mouth daily., Disp: 30 tablet, Rfl: 0 .  ferrous sulfate 325 (65 FE) MG tablet, Take 1 tablet (325 mg total) by mouth daily with  breakfast., Disp: 30 tablet, Rfl: 0 .  lisinopril-hydrochlorothiazide (PRINZIDE,ZESTORETIC) 10-12.5 MG tablet, TAKE 1 TABLET BY MOUTH ONCE DAILY, Disp: 30 tablet, Rfl: 11 .  meloxicam (MOBIC) 15 MG tablet, Take 1 tablet (15 mg total) by mouth daily as needed for pain., Disp: 30 tablet, Rfl: 5 .  metFORMIN (GLUCOPHAGE) 500 MG tablet, Take 1 tablet (500 mg total) by mouth 2 (two) times daily with a meal., Disp: 60 tablet, Rfl: 0 .  Multiple Vitamin (MULTIVITAMINS PO), Take by mouth., Disp: , Rfl:  .  polyethylene glycol powder (GLYCOLAX/MIRALAX) powder, Take 17 g by mouth daily., Disp: 3350 g, Rfl: 0 .  Vitamin D, Ergocalciferol, (DRISDOL) 1.25 MG (50000 UT) CAPS capsule, Take 1 capsule (50,000 Units total) by mouth every 7 (seven) days., Disp: 4 capsule, Rfl: 0   Objective:   Vitals:   10/28/18 1306  BP: 110/84  Pulse: 93  SpO2: 100%    Physical Exam Vitals signs and nursing note reviewed.  Constitutional:      Appearance: Normal appearance.  HENT:     Head: Normocephalic and atraumatic.  Eyes:     Extraocular Movements: Extraocular movements intact.     Pupils: Pupils are equal, round, and reactive to light.  Neck:  Musculoskeletal: Normal range of motion.  Cardiovascular:     Rate and Rhythm: Normal rate.     Pulses: Normal pulses.  Pulmonary:     Effort: Pulmonary effort is normal.  Skin:    General: Skin is warm.  Neurological:     Mental Status: She is alert.  Psychiatric:        Mood and Affect: Mood normal.        Thought Content: Thought content normal.        Judgment: Judgment normal.     Assessment & Plan:  Neck pain  Symptomatic mammary hypertrophy   Plan for bilateral breast reduction. She would like to be as small as possible.  The risk that can be encountered with breast reduction were discussed and include the following but not limited to these:  Breast asymmetry, fluid accumulation, firmness of the breast, inability to breast feed, loss of nipple  or areola, skin loss, decrease or no nipple sensation, fat necrosis of the breast tissue, bleeding, infection, healing delay.  There are risks of anesthesia, changes to skin sensation and injury to nerves or blood vessels.  The muscle can be temporarily or permanently injured.  You may have an allergic reaction to tape, suture, glue, blood products which can result in skin discoloration, swelling, pain, skin lesions, poor healing.  Any of these can lead to the need for revisonal surgery or stage procedures.  A reduction has potential to interfere with diagnostic procedures.  Nipple or breast piercing can increase risks of infection.  This procedure is best done when the breast is fully developed.  Changes in the breast will continue to occur over time.  Pregnancy can alter the outcomes of previous breast reduction surgery, weight gain and weigh loss can also effect the long term appearance.     [place], DO

## 2018-10-28 NOTE — Progress Notes (Signed)
Patient ID: Heidi Robinson, female    DOB: April 15, 1976, 42 y.o.   MRN: [id number]   Chief Complaint  Patient presents with  . Pre-op Exam    for breast reduction    Heidi Robinson is a 42 year old black female here for her history and physical for bilateral breast reduction.  She was evaluated a year ago but had to postpone her surgery and has now been reapproved and is ready for the surgery.  She is a little bit nervous today but knows she needs to and wants to have this done.  She is 225 pounds.  She is 5 feet 2 inches tall.  Her preop bra is 44 G, she would like to be a small as possible. We have estimated that we will get off at least 850 g of both breasts.  She has not had any recent illnesses.  Her MM from last week was negative.    Review of Systems  Constitutional: Negative.   HENT: Negative.   Eyes: Negative.   Respiratory: Negative.   Cardiovascular: Negative.   Gastrointestinal: Negative.   Endocrine: Negative.   Genitourinary: Negative.   Musculoskeletal: Positive for back pain and neck pain.  Skin: Negative.   Hematological: Negative.   Psychiatric/Behavioral: Negative.     Past Medical History:  Diagnosis Date  . Allergy   . Anemia   . Arthritis   . Asthma   . Fibroids   . Hypertension   . Iron deficiency anemia due to chronic blood loss   . Obesity (BMI 30-39.9) 02/02/2012  . Sickle cell trait ([place]) 06/04/2014   hemoglobin electrophoresis confirmed    Past Surgical History:  Procedure Laterality Date  . tonsillecomy    . TONSILLECTOMY  1997      Current Outpatient Medications:  .  albuterol (PROVENTIL) (2.5 MG/3ML) 0.083% nebulizer solution, Inhale 2.5 mg into the lungs every 6 (six) hours as needed for wheezing or shortness of breath., Disp: , Rfl:  .  buPROPion (WELLBUTRIN SR) 200 MG 12 hr tablet, Take 1 tablet (200 mg total) by mouth daily., Disp: 30 tablet, Rfl: 0 .  ferrous sulfate 325 (65 FE) MG tablet, Take 1 tablet (325 mg total) by mouth daily with  breakfast., Disp: 30 tablet, Rfl: 0 .  lisinopril-hydrochlorothiazide (PRINZIDE,ZESTORETIC) 10-12.5 MG tablet, TAKE 1 TABLET BY MOUTH ONCE DAILY, Disp: 30 tablet, Rfl: 11 .  meloxicam (MOBIC) 15 MG tablet, Take 1 tablet (15 mg total) by mouth daily as needed for pain., Disp: 30 tablet, Rfl: 5 .  metFORMIN (GLUCOPHAGE) 500 MG tablet, Take 1 tablet (500 mg total) by mouth 2 (two) times daily with a meal., Disp: 60 tablet, Rfl: 0 .  Multiple Vitamin (MULTIVITAMINS PO), Take by mouth., Disp: , Rfl:  .  polyethylene glycol powder (GLYCOLAX/MIRALAX) powder, Take 17 g by mouth daily., Disp: 3350 g, Rfl: 0 .  Vitamin D, Ergocalciferol, (DRISDOL) 1.25 MG (50000 UT) CAPS capsule, Take 1 capsule (50,000 Units total) by mouth every 7 (seven) days., Disp: 4 capsule, Rfl: 0   Objective:   Vitals:   10/28/18 1306  BP: 110/84  Pulse: 93  SpO2: 100%    Physical Exam Vitals signs and nursing note reviewed.  Constitutional:      Appearance: Normal appearance.  HENT:     Head: Normocephalic and atraumatic.  Eyes:     Extraocular Movements: Extraocular movements intact.     Pupils: Pupils are equal, round, and reactive to light.  Neck:  Musculoskeletal: Normal range of motion.  Cardiovascular:     Rate and Rhythm: Normal rate.     Pulses: Normal pulses.  Pulmonary:     Effort: Pulmonary effort is normal.  Skin:    General: Skin is warm.  Neurological:     Mental Status: She is alert.  Psychiatric:        Mood and Affect: Mood normal.        Thought Content: Thought content normal.        Judgment: Judgment normal.     Assessment & Plan:  Neck pain  Symptomatic mammary hypertrophy   Plan for bilateral breast reduction. She would like to be as small as possible.  The risk that can be encountered with breast reduction were discussed and include the following but not limited to these:  Breast asymmetry, fluid accumulation, firmness of the breast, inability to breast feed, loss of nipple  or areola, skin loss, decrease or no nipple sensation, fat necrosis of the breast tissue, bleeding, infection, healing delay.  There are risks of anesthesia, changes to skin sensation and injury to nerves or blood vessels.  The muscle can be temporarily or permanently injured.  You may have an allergic reaction to tape, suture, glue, blood products which can result in skin discoloration, swelling, pain, skin lesions, poor healing.  Any of these can lead to the need for revisonal surgery or stage procedures.  A reduction has potential to interfere with diagnostic procedures.  Nipple or breast piercing can increase risks of infection.  This procedure is best done when the breast is fully developed.  Changes in the breast will continue to occur over time.  Pregnancy can alter the outcomes of previous breast reduction surgery, weight gain and weigh loss can also effect the long term appearance.     [place], DO

## 2018-10-31 ENCOUNTER — Encounter (HOSPITAL_BASED_OUTPATIENT_CLINIC_OR_DEPARTMENT_OTHER)
Admission: RE | Admit: 2018-10-31 | Discharge: 2018-10-31 | Disposition: A | Discharge status: Home / Self Care | Payer: 59 | Source: Ambulatory Visit | Attending: Plastic Surgery | Admitting: Plastic Surgery

## 2018-10-31 DIAGNOSIS — M542 Cervicalgia: Secondary | ICD-10-CM | POA: Insufficient documentation

## 2018-10-31 DIAGNOSIS — J45909 Unspecified asthma, uncomplicated: Secondary | ICD-10-CM | POA: Diagnosis not present

## 2018-10-31 DIAGNOSIS — Z79899 Other long term (current) drug therapy: Secondary | ICD-10-CM | POA: Diagnosis not present

## 2018-10-31 DIAGNOSIS — N62 Hypertrophy of breast: Secondary | ICD-10-CM

## 2018-10-31 DIAGNOSIS — Z7951 Long term (current) use of inhaled steroids: Secondary | ICD-10-CM | POA: Diagnosis not present

## 2018-10-31 DIAGNOSIS — M1990 Unspecified osteoarthritis, unspecified site: Secondary | ICD-10-CM | POA: Diagnosis not present

## 2018-10-31 DIAGNOSIS — Z6841 Body Mass Index (BMI) 40.0 and over, adult: Secondary | ICD-10-CM | POA: Diagnosis not present

## 2018-10-31 DIAGNOSIS — D509 Iron deficiency anemia, unspecified: Secondary | ICD-10-CM | POA: Diagnosis not present

## 2018-10-31 DIAGNOSIS — Z87891 Personal history of nicotine dependence: Secondary | ICD-10-CM | POA: Diagnosis not present

## 2018-10-31 DIAGNOSIS — I10 Essential (primary) hypertension: Secondary | ICD-10-CM | POA: Diagnosis not present

## 2018-10-31 DIAGNOSIS — M549 Dorsalgia, unspecified: Secondary | ICD-10-CM | POA: Diagnosis not present

## 2018-10-31 DIAGNOSIS — Z7984 Long term (current) use of oral hypoglycemic drugs: Secondary | ICD-10-CM | POA: Diagnosis not present

## 2018-10-31 DIAGNOSIS — D573 Sickle-cell trait: Secondary | ICD-10-CM | POA: Diagnosis not present

## 2018-10-31 DIAGNOSIS — Z01818 Encounter for other preprocedural examination: Secondary | ICD-10-CM | POA: Insufficient documentation

## 2018-10-31 DIAGNOSIS — Z791 Long term (current) use of non-steroidal anti-inflammatories (NSAID): Secondary | ICD-10-CM | POA: Diagnosis not present

## 2018-10-31 DIAGNOSIS — R7303 Prediabetes: Secondary | ICD-10-CM | POA: Diagnosis not present

## 2018-10-31 LAB — BASIC METABOLIC PANEL
Anion gap: 10 (ref 5–15)
BUN: 9 mg/dL (ref 6–20)
CO2: 24 mmol/L (ref 22–32)
Calcium: 9.1 mg/dL (ref 8.9–10.3)
Chloride: 103 mmol/L (ref 98–111)
Creatinine, Ser: 0.79 mg/dL (ref 0.44–1.00)
GFR calc Af Amer: >60 mL/min (ref >60)
GFR calc non Af Amer: >60 mL/min (ref >60)
Glucose, Bld: 83 mg/dL (ref 70–99)
Potassium: 4.6 mmol/L (ref 3.5–5.1)
SODIUM: 137 mmol/L (ref 135–145)

## 2018-10-31 LAB — POCT PREGNANCY, URINE: Preg Test, Ur: NEGATIVE

## 2018-11-03 ENCOUNTER — Encounter (HOSPITAL_BASED_OUTPATIENT_CLINIC_OR_DEPARTMENT_OTHER): Payer: Self-pay | Admitting: *Deleted

## 2018-11-03 ENCOUNTER — Encounter (HOSPITAL_BASED_OUTPATIENT_CLINIC_OR_DEPARTMENT_OTHER)
Admission: RE | Disposition: A | Discharge status: Home / Self Care | Payer: Self-pay | Source: Home / Self Care | Attending: Plastic Surgery

## 2018-11-03 ENCOUNTER — Ambulatory Visit (HOSPITAL_BASED_OUTPATIENT_CLINIC_OR_DEPARTMENT_OTHER): Payer: 59 | Admitting: Certified Registered"

## 2018-11-03 ENCOUNTER — Other Ambulatory Visit: Payer: Self-pay

## 2018-11-03 ENCOUNTER — Ambulatory Visit (HOSPITAL_BASED_OUTPATIENT_CLINIC_OR_DEPARTMENT_OTHER)
Admission: RE | Admit: 2018-11-03 | Discharge: 2018-11-03 | Disposition: A | Discharge status: Home / Self Care | Payer: 59 | Attending: Plastic Surgery | Admitting: Plastic Surgery

## 2018-11-03 DIAGNOSIS — J45909 Unspecified asthma, uncomplicated: Secondary | ICD-10-CM | POA: Insufficient documentation

## 2018-11-03 DIAGNOSIS — M542 Cervicalgia: Secondary | ICD-10-CM | POA: Diagnosis not present

## 2018-11-03 DIAGNOSIS — Z7951 Long term (current) use of inhaled steroids: Secondary | ICD-10-CM | POA: Insufficient documentation

## 2018-11-03 DIAGNOSIS — N62 Hypertrophy of breast: Secondary | ICD-10-CM | POA: Diagnosis not present

## 2018-11-03 DIAGNOSIS — Z79899 Other long term (current) drug therapy: Secondary | ICD-10-CM | POA: Insufficient documentation

## 2018-11-03 DIAGNOSIS — M549 Dorsalgia, unspecified: Secondary | ICD-10-CM | POA: Insufficient documentation

## 2018-11-03 DIAGNOSIS — I10 Essential (primary) hypertension: Secondary | ICD-10-CM | POA: Insufficient documentation

## 2018-11-03 DIAGNOSIS — D509 Iron deficiency anemia, unspecified: Secondary | ICD-10-CM | POA: Insufficient documentation

## 2018-11-03 DIAGNOSIS — Z7984 Long term (current) use of oral hypoglycemic drugs: Secondary | ICD-10-CM | POA: Insufficient documentation

## 2018-11-03 DIAGNOSIS — Z791 Long term (current) use of non-steroidal anti-inflammatories (NSAID): Secondary | ICD-10-CM | POA: Insufficient documentation

## 2018-11-03 DIAGNOSIS — Z6841 Body Mass Index (BMI) 40.0 and over, adult: Secondary | ICD-10-CM | POA: Insufficient documentation

## 2018-11-03 DIAGNOSIS — D573 Sickle-cell trait: Secondary | ICD-10-CM | POA: Insufficient documentation

## 2018-11-03 DIAGNOSIS — M546 Pain in thoracic spine: Secondary | ICD-10-CM | POA: Diagnosis not present

## 2018-11-03 DIAGNOSIS — Z87891 Personal history of nicotine dependence: Secondary | ICD-10-CM | POA: Insufficient documentation

## 2018-11-03 DIAGNOSIS — M1990 Unspecified osteoarthritis, unspecified site: Secondary | ICD-10-CM | POA: Insufficient documentation

## 2018-11-03 DIAGNOSIS — R7303 Prediabetes: Secondary | ICD-10-CM | POA: Insufficient documentation

## 2018-11-03 HISTORY — PX: BREAST REDUCTION SURGERY: SHX8

## 2018-11-03 SURGERY — BREAST REDUCTION WITH LIPOSUCTION
Anesthesia: General | Site: Breast | Laterality: Bilateral

## 2018-11-03 MED ORDER — FENTANYL CITRATE (PF) 100 MCG/2ML IJ SOLN
INTRAMUSCULAR | Status: AC
  Filled 2018-11-03: qty 2

## 2018-11-03 MED ORDER — SODIUM CHLORIDE 0.9 % IV SOLN: 250.0000 mL | INTRAVENOUS | Status: DC | PRN

## 2018-11-03 MED ORDER — LIDOCAINE HCL (CARDIAC) PF 100 MG/5ML IV SOSY
PREFILLED_SYRINGE | INTRAVENOUS | Status: DC | PRN
  Administered 2018-11-03: 100 mg via INTRAVENOUS

## 2018-11-03 MED ORDER — LIDOCAINE HCL 1 % IJ SOLN
INTRAVENOUS | Status: DC | PRN
  Administered 2018-11-03: 500 mL

## 2018-11-03 MED ORDER — FENTANYL CITRATE (PF) 100 MCG/2ML IJ SOLN
50.0000 ug | INTRAMUSCULAR | Status: AC | PRN
  Administered 2018-11-03: 50 ug via INTRAVENOUS
  Administered 2018-11-03: 25 ug via INTRAVENOUS
  Administered 2018-11-03: 100 ug via INTRAVENOUS
  Administered 2018-11-03: 50 ug via INTRAVENOUS
  Administered 2018-11-03: 25 ug via INTRAVENOUS
  Administered 2018-11-03: 50 ug via INTRAVENOUS

## 2018-11-03 MED ORDER — ACETAMINOPHEN 325 MG PO TABS: 650.0000 mg | ORAL_TABLET | ORAL | Status: DC | PRN

## 2018-11-03 MED ORDER — PROPOFOL 10 MG/ML IV BOLUS
INTRAVENOUS | Status: AC
  Filled 2018-11-03: qty 20

## 2018-11-03 MED ORDER — ROCURONIUM BROMIDE 50 MG/5ML IV SOSY
PREFILLED_SYRINGE | INTRAVENOUS | Status: AC
  Filled 2018-11-03: qty 5

## 2018-11-03 MED ORDER — DEXAMETHASONE SODIUM PHOSPHATE 4 MG/ML IJ SOLN
INTRAMUSCULAR | Status: DC | PRN
  Administered 2018-11-03: 10 mg via INTRAVENOUS

## 2018-11-03 MED ORDER — BUPIVACAINE-EPINEPHRINE 0.25% -1:200000 IJ SOLN
INTRAMUSCULAR | Status: DC | PRN
  Administered 2018-11-03: 30 mL

## 2018-11-03 MED ORDER — OXYCODONE HCL 5 MG PO TABS: 5.0000 mg | ORAL_TABLET | ORAL | Status: DC | PRN

## 2018-11-03 MED ORDER — LIDOCAINE HCL (PF) 1 % IJ SOLN
INTRAMUSCULAR | Status: AC
  Filled 2018-11-03: qty 60

## 2018-11-03 MED ORDER — LIDOCAINE-EPINEPHRINE 1 %-1:100000 IJ SOLN
INTRAMUSCULAR | Status: AC
  Filled 2018-11-03: qty 1

## 2018-11-03 MED ORDER — OXYCODONE HCL 5 MG PO TABS
ORAL_TABLET | ORAL | Status: AC
  Filled 2018-11-03: qty 1

## 2018-11-03 MED ORDER — SODIUM CHLORIDE 0.9 % IV SOLN
INTRAVENOUS | Status: AC
  Filled 2018-11-03 (×2): qty 500000

## 2018-11-03 MED ORDER — DEXAMETHASONE SODIUM PHOSPHATE 10 MG/ML IJ SOLN
INTRAMUSCULAR | Status: AC
  Filled 2018-11-03: qty 1

## 2018-11-03 MED ORDER — ROCURONIUM BROMIDE 100 MG/10ML IV SOLN
INTRAVENOUS | Status: DC | PRN
  Administered 2018-11-03: 20 mg via INTRAVENOUS
  Administered 2018-11-03: 30 mg via INTRAVENOUS
  Administered 2018-11-03: 50 mg via INTRAVENOUS

## 2018-11-03 MED ORDER — PROMETHAZINE HCL 25 MG/ML IJ SOLN
6.2500 mg | INTRAMUSCULAR | Status: AC | PRN
  Administered 2018-11-03 (×2): 6.25 mg via INTRAVENOUS

## 2018-11-03 MED ORDER — MEPERIDINE HCL 25 MG/ML IJ SOLN: 6.2500 mg | INTRAMUSCULAR | Status: DC | PRN

## 2018-11-03 MED ORDER — HYDROMORPHONE HCL 1 MG/ML IJ SOLN
0.2500 mg | INTRAMUSCULAR | Status: DC | PRN
  Administered 2018-11-03: 0.5 mg via INTRAVENOUS

## 2018-11-03 MED ORDER — OXYCODONE HCL 5 MG PO TABS
5.0000 mg | ORAL_TABLET | Freq: Once | ORAL | Status: AC | PRN
  Administered 2018-11-03: 5 mg via ORAL

## 2018-11-03 MED ORDER — CIPROFLOXACIN IN D5W 400 MG/200ML IV SOLN
400.0000 mg | INTRAVENOUS | Status: AC
  Administered 2018-11-03: 400 mg via INTRAVENOUS

## 2018-11-03 MED ORDER — CIPROFLOXACIN IN D5W 400 MG/200ML IV SOLN
INTRAVENOUS | Status: AC
  Filled 2018-11-03: qty 200

## 2018-11-03 MED ORDER — SCOPOLAMINE 1 MG/3DAYS TD PT72: 1.0000 | MEDICATED_PATCH | Freq: Once | TRANSDERMAL | Status: DC | PRN

## 2018-11-03 MED ORDER — MIDAZOLAM HCL 2 MG/2ML IJ SOLN
INTRAMUSCULAR | Status: AC
  Filled 2018-11-03: qty 2

## 2018-11-03 MED ORDER — ACETAMINOPHEN 650 MG RE SUPP: 650.0000 mg | RECTAL | Status: DC | PRN

## 2018-11-03 MED ORDER — SODIUM CHLORIDE 0.9 % IV SOLN
INTRAVENOUS | Status: DC | PRN
  Administered 2018-11-03: 500 mL

## 2018-11-03 MED ORDER — PROMETHAZINE HCL 25 MG/ML IJ SOLN
INTRAMUSCULAR | Status: AC
  Filled 2018-11-03: qty 1

## 2018-11-03 MED ORDER — ONDANSETRON HCL 4 MG/2ML IJ SOLN
INTRAMUSCULAR | Status: DC | PRN
  Administered 2018-11-03 (×2): 4 mg via INTRAVENOUS

## 2018-11-03 MED ORDER — SUGAMMADEX SODIUM 500 MG/5ML IV SOLN
INTRAVENOUS | Status: AC
  Filled 2018-11-03: qty 5

## 2018-11-03 MED ORDER — MIDAZOLAM HCL 2 MG/2ML IJ SOLN
1.0000 mg | INTRAMUSCULAR | Status: DC | PRN
  Administered 2018-11-03: 2 mg via INTRAVENOUS

## 2018-11-03 MED ORDER — SODIUM CHLORIDE 0.9% FLUSH: 3.0000 mL | INTRAVENOUS | Status: DC | PRN

## 2018-11-03 MED ORDER — HYDROMORPHONE HCL 1 MG/ML IJ SOLN
INTRAMUSCULAR | Status: AC
  Filled 2018-11-03: qty 0.5

## 2018-11-03 MED ORDER — PHENYLEPHRINE HCL 10 MG/ML IJ SOLN
INTRAMUSCULAR | Status: DC | PRN
  Administered 2018-11-03 (×3): 120 ug via INTRAVENOUS
  Administered 2018-11-03: 80 ug via INTRAVENOUS

## 2018-11-03 MED ORDER — PROPOFOL 10 MG/ML IV BOLUS
INTRAVENOUS | Status: DC | PRN
  Administered 2018-11-03: 200 mg via INTRAVENOUS
  Administered 2018-11-03: 100 mg via INTRAVENOUS

## 2018-11-03 MED ORDER — LIDOCAINE 2% (20 MG/ML) 5 ML SYRINGE
INTRAMUSCULAR | Status: AC
  Filled 2018-11-03: qty 5

## 2018-11-03 MED ORDER — ONDANSETRON HCL 4 MG/2ML IJ SOLN
INTRAMUSCULAR | Status: AC
  Filled 2018-11-03: qty 2

## 2018-11-03 MED ORDER — OXYCODONE HCL 5 MG/5ML PO SOLN: 5.0000 mg | Freq: Once | ORAL | Status: AC | PRN

## 2018-11-03 MED ORDER — LACTATED RINGERS IV SOLN
INTRAVENOUS | Status: DC
  Administered 2018-11-03: 11:00:00 via INTRAVENOUS

## 2018-11-03 MED ORDER — SUGAMMADEX SODIUM 200 MG/2ML IV SOLN
INTRAVENOUS | Status: DC | PRN
  Administered 2018-11-03: 200 mg via INTRAVENOUS

## 2018-11-03 MED ORDER — SODIUM CHLORIDE 0.9% FLUSH: 3.0000 mL | Freq: Two times a day (BID) | INTRAVENOUS | Status: DC

## 2018-11-03 SURGICAL SUPPLY — 73 items
ADH SKN CLS APL DERMABOND .7 (GAUZE/BANDAGES/DRESSINGS) ×2
APPLIER CLIP 9.375 MED OPEN (MISCELLANEOUS) ×2
APR CLP MED 9.3 20 MLT OPN (MISCELLANEOUS) ×1
BAG DECANTER FOR FLEXI CONT (MISCELLANEOUS) ×2 IMPLANT
BINDER BREAST 3XL (GAUZE/BANDAGES/DRESSINGS) ×1 IMPLANT
BINDER BREAST LRG (GAUZE/BANDAGES/DRESSINGS) IMPLANT
BINDER BREAST MEDIUM (GAUZE/BANDAGES/DRESSINGS) IMPLANT
BINDER BREAST XLRG (GAUZE/BANDAGES/DRESSINGS) IMPLANT
BINDER BREAST XXLRG (GAUZE/BANDAGES/DRESSINGS) ×1 IMPLANT
BIOPATCH RED 1 DISK 7.0 (GAUZE/BANDAGES/DRESSINGS) IMPLANT
BLADE HEX COATED 2.75 (ELECTRODE) ×2 IMPLANT
BLADE KNIFE PERSONA 10 (BLADE) ×6 IMPLANT
BLADE SURG 15 STRL LF DISP TIS (BLADE) IMPLANT
BLADE SURG 15 STRL SS (BLADE)
BNDG GAUZE ELAST 4 BULKY (GAUZE/BANDAGES/DRESSINGS) IMPLANT
CANISTER SUCT 1200ML W/VALVE (MISCELLANEOUS) ×2 IMPLANT
CHLORAPREP W/TINT 26ML (MISCELLANEOUS) ×3 IMPLANT
CLIP APPLIE 9.375 MED OPEN (MISCELLANEOUS) IMPLANT
COVER BACK TABLE 60X90IN (DRAPES) ×2 IMPLANT
COVER MAYO STAND STRL (DRAPES) ×2 IMPLANT
COVER WAND RF STERILE (DRAPES) IMPLANT
DECANTER SPIKE VIAL GLASS SM (MISCELLANEOUS) IMPLANT
DERMABOND ADVANCED (GAUZE/BANDAGES/DRESSINGS) ×2
DERMABOND ADVANCED .7 DNX12 (GAUZE/BANDAGES/DRESSINGS) IMPLANT
DRAIN CHANNEL 19F RND (DRAIN) ×2 IMPLANT
DRAPE LAPAROSCOPIC ABDOMINAL (DRAPES) ×2 IMPLANT
DRSG PAD ABDOMINAL 8X10 ST (GAUZE/BANDAGES/DRESSINGS) ×4 IMPLANT
ELECT BLADE 4.0 EZ CLEAN MEGAD (MISCELLANEOUS)
ELECT REM PT RETURN 9FT ADLT (ELECTROSURGICAL) ×2
ELECTRODE BLDE 4.0 EZ CLN MEGD (MISCELLANEOUS) IMPLANT
ELECTRODE REM PT RTRN 9FT ADLT (ELECTROSURGICAL) ×1 IMPLANT
EVACUATOR SILICONE 100CC (DRAIN) ×2 IMPLANT
GLOVE BIO SURGEON STRL SZ 6.5 (GLOVE) ×2 IMPLANT
GLOVE SURG SS PI 6.5 STRL IVOR (GLOVE) ×7 IMPLANT
GLOVE SURG SS PI 7.0 STRL IVOR (GLOVE) ×3 IMPLANT
GOWN STRL REUS W/ TWL LRG LVL3 (GOWN DISPOSABLE) ×2 IMPLANT
GOWN STRL REUS W/ TWL XL LVL3 (GOWN DISPOSABLE) IMPLANT
GOWN STRL REUS W/TWL LRG LVL3 (GOWN DISPOSABLE) ×4
GOWN STRL REUS W/TWL XL LVL3 (GOWN DISPOSABLE) ×4
NDL HYPO 25X1 1.5 SAFETY (NEEDLE) ×1 IMPLANT
NDL SAFETY ECLIPSE 18X1.5 (NEEDLE) IMPLANT
NEEDLE HYPO 18GX1.5 SHARP (NEEDLE)
NEEDLE HYPO 25X1 1.5 SAFETY (NEEDLE) ×2 IMPLANT
NS IRRIG 1000ML POUR BTL (IV SOLUTION) ×1 IMPLANT
PACK BASIN DAY SURGERY FS (CUSTOM PROCEDURE TRAY) ×2 IMPLANT
PAD ALCOHOL SWAB (MISCELLANEOUS) ×1 IMPLANT
PENCIL BUTTON HOLSTER BLD 10FT (ELECTRODE) ×3 IMPLANT
PIN SAFETY STERILE (MISCELLANEOUS) ×1 IMPLANT
SLEEVE SCD COMPRESS KNEE MED (MISCELLANEOUS) ×2 IMPLANT
SPONGE LAP 18X18 RF (DISPOSABLE) ×9 IMPLANT
STRIP SUTURE WOUND CLOSURE 1/2 (SUTURE) ×4 IMPLANT
SUT MNCRL AB 4-0 PS2 18 (SUTURE) ×13 IMPLANT
SUT MON AB 3-0 SH 27 (SUTURE) ×8
SUT MON AB 3-0 SH27 (SUTURE) ×1 IMPLANT
SUT MON AB 5-0 PS2 18 (SUTURE) ×10 IMPLANT
SUT PDS 3-0 CT2 (SUTURE)
SUT PDS AB 2-0 CT2 27 (SUTURE) IMPLANT
SUT PDS II 3-0 CT2 27 ABS (SUTURE) IMPLANT
SUT SILK 3 0 PS 1 (SUTURE) ×2 IMPLANT
SUT VIC AB 3-0 SH 27 (SUTURE) ×2
SUT VIC AB 3-0 SH 27X BRD (SUTURE) IMPLANT
SUT VICRYL 4-0 PS2 18IN ABS (SUTURE) IMPLANT
SYR 3ML 23GX1 SAFETY (SYRINGE) ×1 IMPLANT
SYR 50ML LL SCALE MARK (SYRINGE) IMPLANT
SYR BULB IRRIGATION 50ML (SYRINGE) ×2 IMPLANT
SYR CONTROL 10ML LL (SYRINGE) ×2 IMPLANT
TAPE MEASURE VINYL STERILE (MISCELLANEOUS) IMPLANT
TOWEL GREEN STERILE FF (TOWEL DISPOSABLE) ×5 IMPLANT
TUBE CONNECTING 20X1/4 (TUBING) ×2 IMPLANT
TUBING INFILTRATION IT-10001 (TUBING) ×1 IMPLANT
TUBING SET GRADUATE ASPIR 12FT (MISCELLANEOUS) ×1 IMPLANT
UNDERPAD 30X30 (UNDERPADS AND DIAPERS) ×4 IMPLANT
YANKAUER SUCT BULB TIP NO VENT (SUCTIONS) ×2 IMPLANT

## 2018-11-03 NOTE — Interval H&P Note (Signed)
History and Physical Interval Note:  11/03/2018 11:16 AM  Heidi Robinson  has presented today for surgery, with the diagnosis of Neck pain; Symptomatic mammary hypertrophy  The various methods of treatment have been discussed with the patient and family. After consideration of risks, benefits and other options for treatment, the patient has consented to  Procedure(s): BILATERAL BREAST REDUCTION (Bilateral) as a surgical intervention .  The patient's history has been reviewed, patient examined, no change in status, stable for surgery.  I have reviewed the patient's chart and labs.  Questions were answered to the patient's satisfaction.     Loel Lofty Crystal Scarberry

## 2018-11-03 NOTE — Transfer of Care (Signed)
Immediate Anesthesia Transfer of Care Note  Patient: Heidi Robinson  Procedure(s) Performed: BILATERAL BREAST REDUCTION WITH LIPOSUCTION (Bilateral Breast)  Patient Location: PACU  Anesthesia Type:General  Level of Consciousness: awake and sedated  Airway & Oxygen Therapy: Patient Spontanous Breathing and Patient connected to face mask oxygen  Post-op Assessment: Report given to RN and Post -op Vital signs reviewed and stable  Post vital signs: Reviewed and stable  Last Vitals:  Vitals Value Taken Time  BP 122/71 11/03/2018  4:17 PM  Temp    Pulse 115 11/03/2018  4:18 PM  Resp 29 11/03/2018  4:18 PM  SpO2 100 % 11/03/2018  4:18 PM  Vitals shown include unvalidated device data.  Last Pain:  Vitals:   11/03/18 1032  TempSrc: Oral  PainSc: 0-No pain         Complications: No apparent anesthesia complications

## 2018-11-03 NOTE — Anesthesia Preprocedure Evaluation (Signed)
Anesthesia Evaluation  Patient identified by MRN, date of birth, ID band Patient awake    Reviewed: Allergy & Precautions, NPO status , Patient's Chart, lab work & pertinent test results  Airway Mallampati: II  TM Distance: >3 FB Neck ROM: Full    Dental no notable dental hx.    Pulmonary asthma , former smoker,    Pulmonary exam normal breath sounds clear to auscultation       Cardiovascular hypertension, Pt. on medications negative cardio ROS Normal cardiovascular exam Rhythm:Regular Rate:Normal     Neuro/Psych negative neurological ROS  negative psych ROS   GI/Hepatic negative GI ROS, Neg liver ROS,   Endo/Other  Morbid obesity  Renal/GU negative Renal ROS  negative genitourinary   Musculoskeletal  (+) Arthritis , Osteoarthritis,    Abdominal (+) + obese,   Peds negative pediatric ROS (+)  Hematology negative hematology ROS (+)   Anesthesia Other Findings   Reproductive/Obstetrics negative OB ROS                             Anesthesia Physical Anesthesia Plan  ASA: III  Anesthesia Plan: General   Post-op Pain Management:    Induction: Intravenous  PONV Risk Score and Plan: 3 and Ondansetron, Dexamethasone and Midazolam  Airway Management Planned: Oral ETT  Additional Equipment:   Intra-op Plan:   Post-operative Plan: Extubation in OR  Informed Consent: I have reviewed the patients History and Physical, chart, labs and discussed the procedure including the risks, benefits and alternatives for the proposed anesthesia with the patient or authorized representative who has indicated his/her understanding and acceptance.   Dental advisory given  Plan Discussed with: CRNA  Anesthesia Plan Comments:         Anesthesia Quick Evaluation

## 2018-11-03 NOTE — Discharge Instructions (Signed)
INSTRUCTIONS FOR AFTER SURGERY ° ° °You are having surgery.  You will likely have some questions about what to expect following your operation.  The following information will help you and your family understand what to expect when you are discharged from the hospital.  Following these guidelines will help ensure a smooth recovery and reduce risks of complications.   °Postoperative instructions include information on: diet, wound care, medications and physical activity. ° °AFTER SURGERY °Expect to go home after the procedure.  In some cases, you may need to spend one night in the hospital for observation. ° °DIET °This surgery does not require a specific diet.  However, I have to mention that the healthier you eat the better your body can start healing. It is important to increasing your protein intake.  This means limiting the foods with sugar and carbohydrates.  Focus on vegetables and some meat.  If you have any liposuction during your procedure be sure to drink water.  If your urine is bright yellow, then it is concentrated, and you need to drink more water.  As a general rule after surgery, you should have 8 ounces of water every hour while awake.  If you find you are persistently nauseated or unable to take in liquids let us know.  NO TOBACCO USE or EXPOSURE.  This will slow your healing process and increase the risk of a wound. ° °WOUND CARE °You can shower the day after surgery if you don't have a drain.  Use fragrance free soap.  Dial, Dove and Ivory are usually mild on the skin. If you have a drain clean with baby wipes until the drain is removed.  If you have steri-strips / tape directly attached to your skin leave them in place. It is OK to get these wet.  No baths, pools or hot tubs for two weeks. °We close your incision to leave the smallest and best-looking scar. No ointment or creams on your incisions until given the go ahead.  Especially not Neosporin (Too many skin reactions with this one).  A few  weeks after surgery you can use Mederma and start massaging the scar. °We ask you to wear your binder or sports bra for the first 6 weeks around the clock, including while sleeping. This provides added comfort and helps reduce the fluid accumulation at the surgery site. ° °ACTIVITY °No heavy lifting until cleared by the doctor.  It is OK to walk and climb stairs. In fact, moving your legs is very important to decrease your risk of a blood clot.  It will also help keep you from getting deconditioned.  Every 1 to 2 hours get up and walk for 5 minutes. This will help with a quicker recovery back to normal.  Let pain be your guide so you don't do too much.  NO, you cannot do the spring cleaning and don't plan on taking care of anyone else.  This is your time for TLC.  °You will be more comfortable if you sleep and rest with your head elevated either with a few pillows under you or in a recliner.  No stomach sleeping for a few months. ° °WORK °Everyone returns to work at different times. As a rough guide, most people take at least 1 - 2 weeks off prior to returning to work. If you need documentation for your job, bring the forms to your postoperative follow up visit. ° °DRIVING °Arrange for someone to bring you home from the hospital.  You   may be able to drive a few days after surgery but not while taking any narcotics or valium.  BOWEL MOVEMENTS Constipation can occur after anesthesia and while taking pain medication.  It is important to stay ahead for your comfort.  We recommend taking Milk of Magnesia (2 tablespoons; twice a day) while taking the pain pills.  SEROMA This is fluid your body tried to put in the surgical site.  This is normal but if it creates tight skinny skin let us know.  It usually decreases in a few weeks.  WHEN TO CALL Call your surgeon's office if any of the following occur:  Fever 101 degrees F or greater  Excessive bleeding or fluid from the incision site.  Pain that increases  over time without aid from the medications  Redness, warmth, or pus draining from incision sites  Persistent nausea or inability to take in liquids  Severe misshapen area that underwent the operation.  *You had oxycodone at 6:00pm; follow medication instructions on your prescription label for next dose.   Post Anesthesia Home Care Instructions  Activity: Get plenty of rest for the remainder of the day. A responsible individual must stay with you for 24 hours following the procedure.  For the next 24 hours, DO NOT: -Drive a car -Paediatric nurse -Drink alcoholic beverages -Take any medication unless instructed by your physician -Make any legal decisions or sign important papers.  Meals: Start with liquid foods such as gelatin or soup. Progress to regular foods as tolerated. Avoid greasy, spicy, heavy foods. If nausea and/or vomiting occur, drink only clear liquids until the nausea and/or vomiting subsides. Call your physician if vomiting continues.  Special Instructions/Symptoms: Your throat may feel dry or sore from the anesthesia or the breathing tube placed in your throat during surgery. If this causes discomfort, gargle with warm salt water. The discomfort should disappear within 24 hours.  If you had a scopolamine patch placed behind your ear for the management of post- operative nausea and/or vomiting:  1. The medication in the patch is effective for 72 hours, after which it should be removed.  Wrap patch in a tissue and discard in the trash. Wash hands thoroughly with soap and water. 2. You may remove the patch earlier than 72 hours if you experience unpleasant side effects which may include dry mouth, dizziness or visual disturbances. 3. Avoid touching the patch. Wash your hands with soap and water after contact with the patch.       JP Drain [employer] this sheet to all of your post-operative appointments while you have your drains.  Please measure your drains by  CC's or ML's.  Make sure you drain and measure your JP Drains 2 or 3 times per day.  At the end of each day, add up totals for the left side and add up totals for the right side.    ( 9 am )     ( 3 pm )        ( 9 pm )                Date L  R  L  R  L  R  Total L/R

## 2018-11-03 NOTE — Op Note (Addendum)
Breast Reduction Op note:    DATE OF PROCEDURE: 11/03/2018  LOCATION: [place]  SURGEON: Lyndee Leo Sanger [place], DO  ASSISTANT: Ronette Deter, PA, [place], RNFA  PREOPERATIVE DIAGNOSIS 1. Macromastia 2. Neck Pain 3. Back Pain  POSTOPERATIVE DIAGNOSIS 1. Macromastia 2. Neck Pain 3. Back Pain  PROCEDURES 1. Bilateral breast reduction.  Right reduction 1330g, Left reduction 1761Y  COMPLICATIONS: None.  DRAINS: bilateral  INDICATIONS FOR PROCEDURE Heidi Robinson is a 42 y.o. year old female born on Jul 15, 1976, with a history of symptomatic macromastia with concominant back pain, neck pain, shoulder grooving from her bra.   MRN: [id number]  CONSENT Informed consent was obtained directly from the patient. The risks, benefits and alternatives were fully discussed. Specific risks including but not limited to bleeding, infection, hematoma, seroma, scarring, pain, nipple necrosis, asymmetry, poor cosmetic results, and need for further surgery were discussed. The patient had ample opportunity to have her questions answered to her satisfaction.  DESCRIPTION OF PROCEDURE  Patient was brought into the operating room and placed in a supine position.  SCDs were placed and appropriate padding was performed.  Antibiotics were given. The patient underwent general anesthesia and the chest was prepped and draped in a sterile fashion.  A timeout was performed and all information was confirmed to be correct. Tumescent was infused in the lateral breast area.  Left:  Preoperative markings were confirmed.  Incision lines were injected with 1% Xylocaine with epinephrine. Liposuction was performed laterally. After waiting for vasoconstriction, the marked lines were incised.  An inferior pedical breast reduction was performed by de-epithelializing the pedicle, using bovie to create the lateral and medial pedicles, and removing breast tissue from the superior, lateral, and medial  portions of the breast.  Care was taken to not undermine the breast pedicle. Hemostasis was achieved.  The nipple was gently rotated into position and the skin was temporarily closed with staples.  The patient was sat upright and size and shape symmetry was confirmed.  The pocket was irrigated, a drain was placed, and hemostasis confirmed.  The deep tissues were approximated with 3-0 Vicryl sutures and the skin was closed with deep dermal and subcuticular 4-0 Monocryl sutures followed by 5-0 Monocryl.  The nipple areola complex was brought out with the skin de-epithelialized at the location to make place for the complex.  The area was secured with 4-0 Monocryl at the deep layers followed by 5-0 Monocryl.  The nipple and skin flaps had good capillary refill at the end of the procedure.   Right:  Preoperative markings were confirmed.  Liposuction was performed laterally. Incision lines were injected with 1% Xylocaine with epinephrine.  After waiting for vasoconstriction, the marked lines were incised.  An inferior pedical breast reduction was performed by de-epithelializing the pedicle, using bovie to create the lateral and medial pedicles, and removing breast tissue from the superior, lateral, and medial portions of the breast.  Care was taken to not undermine the breast pedicle. Hemostasis was achieved.  The nipple was gently rotated into position and the skin was temporarily closed with staples.  The patient was sat upright and size and shape symmetry was confirmed.  The pocket was irrigated, a drain was placed, and hemostasis confirmed.  The deep tissues were approximated with 3-0 Monocryl sutures and the skin was closed with deep dermal and subcuticular 4-0 Monocryl sutures followed by 5-0 Monocryl.  The nipple areola complex was brought out with the skin de-epithelialized at the location to make  place for the complex.  The area was secured with 4-0 Monocryl at the deep layers followed by 5-0 Monocryl.  The  nipple and skin flaps had good capillary refill at the end of the procedure. The RN first assistant assisted throughout the case.  The RNFA was essential in retraction and counter traction when needed to make the case progress smoothly.  This retraction and assistance made it possible to see the tissue plans for the procedure.  The assistance was needed for blood control, tissue re-approximation and assisted with closure of the incision site. The patient tolerated the procedure well. The patient was allowed to wake from anesthesia and taken to the recovery room in satisfactory condition.

## 2018-11-03 NOTE — Anesthesia Postprocedure Evaluation (Signed)
Anesthesia Post Note  Patient: Heidi Robinson  Procedure(s) Performed: BILATERAL BREAST REDUCTION WITH LIPOSUCTION (Bilateral Breast)     Patient location during evaluation: PACU Anesthesia Type: General Level of consciousness: awake and alert Pain management: pain level controlled Vital Signs Assessment: post-procedure vital signs reviewed and stable Respiratory status: spontaneous breathing, nonlabored ventilation, respiratory function stable and patient connected to nasal cannula oxygen Cardiovascular status: blood pressure returned to baseline and stable Postop Assessment: no apparent nausea or vomiting Anesthetic complications: no    Last Vitals:  Vitals:   11/03/18 1630 11/03/18 1645  BP: 122/78 131/76  Pulse: (!) 110 (!) 108  Resp: 17 (!) 23  Temp:    SpO2: 98% 99%    Last Pain:  Vitals:   11/03/18 1618  TempSrc:   PainSc: Asleep                 Montez Hageman

## 2018-11-04 ENCOUNTER — Encounter (HOSPITAL_BASED_OUTPATIENT_CLINIC_OR_DEPARTMENT_OTHER): Payer: Self-pay | Admitting: Plastic Surgery

## 2018-11-11 ENCOUNTER — Ambulatory Visit (INDEPENDENT_AMBULATORY_CARE_PROVIDER_SITE_OTHER): Payer: 59 | Admitting: Plastic Surgery

## 2018-11-11 ENCOUNTER — Encounter: Payer: Self-pay | Admitting: Physician Assistant

## 2018-11-11 VITALS — BP 139/84 | HR 99 | Temp 98.0°F | Ht 62.0 in | Wt 225.0 lb

## 2018-11-11 DIAGNOSIS — N62 Hypertrophy of breast: Secondary | ICD-10-CM

## 2018-11-11 MED ORDER — TRAMADOL HCL 50 MG PO TABS: 50.0000 mg | ORAL_TABLET | Freq: Two times a day (BID) | ORAL | 0 refills | Status: AC | PRN

## 2018-11-11 NOTE — Progress Notes (Signed)
   Subjective:    Patient ID: Heidi Robinson, female    DOB: [birth year], 43 y.o.   MRN: [id number]  The patient is a 43 year old female who is here for a postop follow-up on her breast she had nearly 1200 and 1300 g removed from each breast.  The drains are in and the drainage is minimal.  There is no sign of hematoma or seroma.  No sign of infection or cellulitis.  She is does have some swelling as expected.  She states she is having some feelings of an irregular heart rate.  She does not have any dizziness or shortness of breath.  She is worried about going back to work and the stress involved.  The patient states that her back feels better already.  Review of Systems  Constitutional: Negative.   HENT: Negative.   Eyes: Negative.   Respiratory: Negative.   Gastrointestinal: Negative.   Endocrine: Negative.   Genitourinary: Negative.   Skin: Negative.        Objective:   Physical Exam Vitals signs and nursing note reviewed.  Constitutional:      Appearance: Normal appearance.  HENT:     Head: Normocephalic and atraumatic.  Cardiovascular:     Rate and Rhythm: Normal rate.  Neurological:     General: No focal deficit present.     Mental Status: She is alert.  Psychiatric:        Mood and Affect: Mood normal.        Thought Content: Thought content normal.        Judgment: Judgment normal.        Assessment & Plan:  Symptomatic mammary hypertrophy  The drains were removed.  She can shower tomorrow.  I would like to see her back in 10 days.  I would like her to try some Ultram for the pain.  I have also asked that she get an appointment with Dr. Brigitte Pulse for evaluation of what appears to be PVCs.  She has assured me she will call Dr. Manuella Ghazi today.

## 2018-11-18 ENCOUNTER — Ambulatory Visit: Payer: 59 | Admitting: Family Medicine

## 2018-11-19 ENCOUNTER — Encounter: Payer: Self-pay | Admitting: Osteopathic Medicine

## 2018-11-19 ENCOUNTER — Ambulatory Visit (INDEPENDENT_AMBULATORY_CARE_PROVIDER_SITE_OTHER): Payer: 59 | Admitting: Osteopathic Medicine

## 2018-11-19 ENCOUNTER — Other Ambulatory Visit: Payer: Self-pay

## 2018-11-19 VITALS — BP 138/81 | HR 100 | Temp 98.0°F | Resp 16 | Ht 62.0 in | Wt 222.0 lb

## 2018-11-19 DIAGNOSIS — R002 Palpitations: Secondary | ICD-10-CM | POA: Diagnosis not present

## 2018-11-19 NOTE — Progress Notes (Signed)
HPI: Heidi Robinson is a 43 y.o. female who  has a past medical history of Allergy, Anemia, Arthritis, Asthma, Fibroids, Hypertension, Iron deficiency anemia due to chronic blood loss, Obesity (BMI 30-39.9) (02/02/2012), and Sickle cell trait ([place]) (06/04/2014).  she presents to [place] at [facility] today, 11/19/18,  for chief complaint of:  Chief Complaint  Patient presents with  . Palpitations    pt had a breast reduction on dec 26th and states she has had some palpitations.   . Context: started day after surgery, thinks might have something to do w/ surgery, her surgeon advised see PCP . Location: chest . Quality: racing heart beat . Duration: since 11/03/18 (16 days)  . Timing: was happening several times per day the first week, now every few days, has had 2 episodes over the past week. Happens with sitting still. Lasts a few minutes then resolves . Modifying factors:seems to get better with breathing exercises  . Assoc signs/symptoms: no dizziness or chest pain, no lightheadedness, no SOB, no anxiety       Past medical history, surgical history, and family history reviewed.  Current medication list and allergy/intolerance information reviewed.   (See remainder of HPI, ROS, Phys Exam below)  EKG interpretation: Rate: 80 Rhythm: sinus Low voltage (nurse had to place leads a bit more inferiorly given recent breast reduction surgical wound) No ST/T changes concerning for acute ischemia/infarct  Previous EKG 12/21/17, looks consistent       ASSESSMENT/PLAN:   Palpitations - Plan: EKG 12-Lead, HOLTER MONITOR - 72 HOUR, CBC, TSH, Magnesium, CMP14+EGFR, CANCELED: COMPLETE METABOLIC PANEL WITH GFR      Patient Instructions  Call insurance - ask about coverage for  Holter monitor (up to 48 hours)  Zio patch (3 -14 days)   Will get blood work today We will call you on Monday with results   Call us if anything changes or gets worse        Follow-up  plan: Return if symptoms worsen or fail to improve and as directed by Dr Brigitte Pulse for routine care .                           ############################################ ############################################ ############################################ ############################################    Outpatient Encounter Medications as of 11/19/2018  Medication Sig  . albuterol (PROVENTIL) (2.5 MG/3ML) 0.083% nebulizer solution Inhale 2.5 mg into the lungs every 6 (six) hours as needed for wheezing or shortness of breath.  [name] buPROPion (WELLBUTRIN SR) 200 MG 12 hr tablet Take 1 tablet (200 mg total) by mouth daily.  . ferrous sulfate 325 (65 FE) MG tablet Take 1 tablet (325 mg total) by mouth daily with breakfast.  . lisinopril-hydrochlorothiazide (PRINZIDE,ZESTORETIC) 10-12.5 MG tablet TAKE 1 TABLET BY MOUTH ONCE DAILY  . meloxicam (MOBIC) 15 MG tablet Take 1 tablet (15 mg total) by mouth daily as needed for pain.  . metFORMIN (GLUCOPHAGE) 500 MG tablet Take 1 tablet (500 mg total) by mouth 2 (two) times daily with a meal.  . Multiple Vitamin (MULTIVITAMINS PO) Take by mouth.  . polyethylene glycol powder (GLYCOLAX/MIRALAX) powder Take 17 g by mouth daily.  . traMADol (ULTRAM) 50 MG tablet Take by mouth every 6 (six) hours as needed.  . Vitamin D, Ergocalciferol, (DRISDOL) 1.25 MG (50000 UT) CAPS capsule Take 1 capsule (50,000 Units total) by mouth every 7 (seven) days.   No facility-administered encounter medications on file as of 11/19/2018.    Allergies  Allergen Reactions  . Penicillins Anaphylaxis  . Food     Fish, Walnuts, Cashews, Almonds, Pecans, Blueberries, [place] nuts, Pistachios, Macadamia Nuts, Coconuts, Chestnuts  . Latex     "Rash, hives maybe... I don't really remember"      Review of Systems:  Constitutional: No recent illness  HEENT: No  headache, no vision change  Cardiac: No  chest pain, No  pressure, +palpitations  Respiratory:   No  shortness of breath. No  Cough  Gastrointestinal: No  abdominal pain, no change on bowel habits  Musculoskeletal: No new myalgia/arthralgia  Skin: No  Rash  Hem/Onc: No  easy bruising/bleeding, No  abnormal lumps/bumps  Neurologic: No  weakness, No  Dizziness  Psychiatric: No  concerns with depression, No  concerns with anxiety  Exam:  BP 138/81   Pulse 100   Temp 98 F (36.7 C) (Oral)   Resp 16   Ht _0  (1.575 m)   Wt 222 lb (100.7 kg)   LMP 11/06/2018 (Approximate)   SpO2 99%   BMI 40.60 kg/m   Constitutional: VS see above. General Appearance: alert, well-developed, well-nourished, NAD  Eyes: Normal lids and conjunctive, non-icteric sclera  Ears, Nose, Mouth, Throat: MMM, Normal external inspection ears/nares/mouth/lips/gums.  Neck: No masses, trachea midline.   Respiratory: Normal respiratory effort. no wheeze, no rhonchi, no rales  Cardiovascular: S1/S2 normal, no murmur, no rub/gallop auscultated. RRR.   Musculoskeletal: Gait normal. Symmetric and independent movement of all extremities  Neurological: Normal balance/coordination. No tremor.  Skin: warm, dry, intact.   Psychiatric: Normal judgment/insight. Normal mood and affect. Oriented x3.   Visit summary with medication list and pertinent instructions was printed for patient to review, advised to alert [place] if any changes needed. All questions at time of visit were answered - patient instructed to contact office with any additional concerns. ER/RTC precautions were reviewed with the patient and understanding verbalized.   Follow-up plan: Return if symptoms worsen or fail to improve and as directed by Dr Brigitte Pulse for routine care .    Please note: voice recognition software was used to produce this document, and typos may escape review. Please contact Dr. Sheppard Coil for any needed clarifications.

## 2018-11-19 NOTE — Patient Instructions (Signed)
Call insurance - ask about coverage for  Holter monitor (up to 48 hours)  Zio patch (3 -14 days)   Will get blood work today We will call you on Monday with results   Call us if anything changes or gets worse

## 2018-11-20 LAB — MAGNESIUM: Magnesium: 2.1 mg/dL (ref 1.6–2.3)

## 2018-11-20 LAB — CMP14+EGFR
ALK PHOS: 70 IU/L (ref 39–117)
ALT: 10 IU/L (ref 0–32)
AST: 11 IU/L (ref 0–40)
Albumin/Globulin Ratio: 1.7 (ref 1.2–2.2)
Albumin: 4.1 g/dL (ref 3.5–5.5)
BUN/Creatinine Ratio: 10 (ref 9–23)
BUN: 8 mg/dL (ref 6–24)
Bilirubin Total: <0.2 mg/dL (ref 0.0–1.2)
CHLORIDE: 100 mmol/L (ref 96–106)
CO2: 22 mmol/L (ref 20–29)
Calcium: 9.6 mg/dL (ref 8.7–10.2)
Creatinine, Ser: 0.77 mg/dL (ref 0.57–1.00)
GFR calc Af Amer: 110 mL/min/{1.73_m2} (ref >59)
GFR calc non Af Amer: 96 mL/min/{1.73_m2} (ref >59)
GLUCOSE: 108 mg/dL — AB (ref 65–99)
Globulin, Total: 2.4 g/dL (ref 1.5–4.5)
Potassium: 4.3 mmol/L (ref 3.5–5.2)
Sodium: 139 mmol/L (ref 134–144)
Total Protein: 6.5 g/dL (ref 6.0–8.5)

## 2018-11-20 LAB — CBC
Hematocrit: 30 % — ABNORMAL LOW (ref 34.0–46.6)
Hemoglobin: 9.5 g/dL — ABNORMAL LOW (ref 11.1–15.9)
MCH: 21.4 pg — ABNORMAL LOW (ref 26.6–33.0)
MCHC: 31.7 g/dL (ref 31.5–35.7)
MCV: 68 fL — ABNORMAL LOW (ref 79–97)
Platelets: 496 10*3/uL — ABNORMAL HIGH (ref 150–450)
RBC: 4.44 x10E6/uL (ref 3.77–5.28)
RDW: 15.7 % — ABNORMAL HIGH (ref 11.7–15.4)
WBC: 12.5 10*3/uL — ABNORMAL HIGH (ref 3.4–10.8)

## 2018-11-20 LAB — TSH: TSH: 1.06 u[IU]/mL (ref 0.450–4.500)

## 2018-11-22 ENCOUNTER — Encounter: Payer: Self-pay | Admitting: Physician Assistant

## 2018-11-22 ENCOUNTER — Ambulatory Visit (INDEPENDENT_AMBULATORY_CARE_PROVIDER_SITE_OTHER): Payer: Commercial Managed Care - PPO | Admitting: Physician Assistant

## 2018-11-22 VITALS — BP 137/87 | HR 111 | Temp 99.1°F | Ht 62.0 in | Wt 222.0 lb

## 2018-11-22 DIAGNOSIS — Z9889 Other specified postprocedural states: Secondary | ICD-10-CM

## 2018-11-22 DIAGNOSIS — N62 Hypertrophy of breast: Secondary | ICD-10-CM

## 2018-11-22 MED ORDER — COLLAGEN MATRIX FENEST (PORC) 2X2CM EX SHEE: 1.0000 "application " | MEDICATED_PATCH | CUTANEOUS | 2 refills | Status: AC

## 2018-11-22 NOTE — Progress Notes (Signed)
  Subjective:     Patient ID: Heidi Robinson, female   DOB: 1976-10-22, 43 y.o.   MRN: [id number]  HPI Very pleasant 43 year old African-American female patient presents to the clinic status post bilateral breast reduction on 11/03/2018.  The patient has noted a discharge that has an odor from the inframammary fold incision.  The patient denies fevers chills night sweats. Pt is pleased with her outcome overall.  She has already noted a decrease in shoulder and back pain.    Review of Systems  Constitutional: Negative.   Respiratory: Negative.   Cardiovascular: Negative.   Skin: Negative.   Hematological: Negative.   Psychiatric/Behavioral: Negative.       Objective:   Physical Exam Constitutional:      Appearance: Normal appearance.  Pulmonary:     Effort: Pulmonary effort is normal.  Abdominal:     Palpations: Abdomen is soft.  Skin:    General: Skin is warm and dry.  Neurological:     Mental Status: She is alert and oriented to person, place, and time.  Psychiatric:        Mood and Affect: Mood normal.        Behavior: Behavior normal.        Judgment: Judgment normal.   breakdown at the suture point where  the skin is brought together in the middle of the inframammary fold on the left, mild breakdown noted of the inframamary fold on the right No discharge, no erythema, no sign of infection      Assessment:     S/p bilateral breast reduction    Plan:     Pt was assured by Dr.Dillingham there was no infection Dr. Marla Roe sent in a script for topical collagen Pt instructed if the pharmacy would not fill or insurance did not cover to inquire of the pharmacy for a OTC equivalent.  If none is available to continue to use Vaseline and cover with gauze which will be held in place by her sports bra Pt will return to clinic in 2 weeks

## 2018-11-23 ENCOUNTER — Encounter (INDEPENDENT_AMBULATORY_CARE_PROVIDER_SITE_OTHER): Payer: Self-pay | Admitting: Family Medicine

## 2018-11-23 ENCOUNTER — Ambulatory Visit (INDEPENDENT_AMBULATORY_CARE_PROVIDER_SITE_OTHER): Payer: 59 | Admitting: Family Medicine

## 2018-11-23 VITALS — BP 117/72 | HR 92 | Temp 98.2°F | Ht 62.0 in | Wt 217.0 lb

## 2018-11-23 DIAGNOSIS — D508 Other iron deficiency anemias: Secondary | ICD-10-CM | POA: Diagnosis not present

## 2018-11-23 DIAGNOSIS — F3289 Other specified depressive episodes: Secondary | ICD-10-CM | POA: Diagnosis not present

## 2018-11-23 DIAGNOSIS — E559 Vitamin D deficiency, unspecified: Secondary | ICD-10-CM

## 2018-11-23 DIAGNOSIS — Z6839 Body mass index (BMI) 39.0-39.9, adult: Secondary | ICD-10-CM

## 2018-11-23 DIAGNOSIS — Z9189 Other specified personal risk factors, not elsewhere classified: Secondary | ICD-10-CM

## 2018-11-23 DIAGNOSIS — R7303 Prediabetes: Secondary | ICD-10-CM | POA: Diagnosis not present

## 2018-11-23 MED ORDER — FERROUS SULFATE 325 (65 FE) MG PO TABS: 325.0000 mg | ORAL_TABLET | Freq: Every day | ORAL | 0 refills | Status: DC

## 2018-11-23 MED ORDER — VITAMIN D (ERGOCALCIFEROL) 1.25 MG (50000 UNIT) PO CAPS: 50000.0000 [IU] | ORAL_CAPSULE | ORAL | 0 refills | Status: DC

## 2018-11-23 MED ORDER — METFORMIN HCL 500 MG PO TABS: 500.0000 mg | ORAL_TABLET | Freq: Two times a day (BID) | ORAL | 0 refills | Status: DC

## 2018-11-23 MED ORDER — BUPROPION HCL ER (SR) 200 MG PO TB12: 200.0000 mg | ORAL_TABLET | Freq: Every day | ORAL | 0 refills | Status: DC

## 2018-11-24 ENCOUNTER — Telehealth: Payer: Self-pay | Admitting: Plastic Surgery

## 2018-11-24 NOTE — Telephone Encounter (Signed)
Patient called regarding denied surgery claim with [employer].   Called [facility] [phone] and spoke with Rep: Ronalee Belts. He has updated DOS for the authorization to 11/03/18 and requests 24 hours for their database to update before calling back to resubmit her surgery claim  Call Ref# [id number]  Spoke with patient and made her aware

## 2018-11-28 NOTE — Progress Notes (Signed)
Office: [phone]  /  Fax: [phone]   HPI:   Chief Complaint: OBESITY Heidi Robinson is here to discuss her progress with her obesity treatment plan. She is on the portion control better and make smarter food choices, such as increase vegetables and decrease simple carbohydrates and is following her eating plan approximately 35 % of the time. She states she is exercising 0 minutes 0 times per week. Heidi Robinson had breast reductions surgery and is happy with her results. Part of her weight loss is due to this but she is ready to get back on track.  Her weight is 217 lb (98.4 kg) today and has had a weight loss of 8 pounds over a period of 5 weeks since her last visit. She has lost 41 lbs since starting treatment with [place].  Vitamin D Deficiency Heidi Robinson has a diagnosis of vitamin D deficiency. She is stable on prescription Vit D and level is almost at goal. She denies nausea, vomiting or muscle weakness.  Pre-Diabetes Heidi Robinson has a diagnosis of pre-diabetes based on her elevated Hgb A1c and was informed this puts her at greater risk of developing diabetes. She is doing well on metformin and diet prescription. She denies nausea, vomiting, or hypoglycemia.  At risk for diabetes Heidi Robinson is at higher than average risk for developing diabetes due to her obesity and pre-diabetes. She currently denies polyuria or polydipsia.  Iron Deficiency Anemia Heidi Robinson has a diagnosis of anemia. Her hemoglobin decreased after surgery. She denies feeling lightheadedness or palpitations.   Depression with emotional eating behaviors Heidi Robinson's mood is stable on Wellbutrin. She is doing well on decreasing emotional and stress eating. Heidi Robinson struggles with emotional eating and using food for comfort to the extent that it is negatively impacting her health. She often snacks when she is not hungry. Heidi Robinson sometimes feels she is out of control and then feels guilty that she made poor food choices. She has been working on behavior modification  techniques to help reduce her emotional eating and has been somewhat successful. She shows no sign of suicidal or homicidal ideations.  Depression screen [facility] 2/9 11/19/2018 08/06/2018 12/21/2017 11/06/2017 08/04/2017  Decreased Interest 0 0 3 0 0  Down, Depressed, Hopeless 0 0 3 0 0  PHQ - 2 Score 0 0 6 0 0  Altered sleeping - - 2 - -  Tired, decreased energy - - 3 - -  Change in appetite - - 2 - -  Feeling bad or failure about yourself  - - 2 - -  Trouble concentrating - - 3 - -  Moving slowly or fidgety/restless - - 2 - -  Suicidal thoughts - - 0 - -  PHQ-9 Score - - 20 - -  Difficult doing work/chores - - Somewhat difficult - -    ASSESSMENT AND PLAN:  Vitamin D deficiency - Plan: Vitamin D, Ergocalciferol, (DRISDOL) 1.25 MG (50000 UT) CAPS capsule  Other iron deficiency anemia - Plan: ferrous sulfate 325 (65 FE) MG tablet  Prediabetes - Plan: metFORMIN (GLUCOPHAGE) 500 MG tablet  Other depression - with emotional eating - Plan: buPROPion (WELLBUTRIN SR) 200 MG 12 hr tablet  At risk for diabetes mellitus  Class 2 severe obesity with serious comorbidity and body mass index (BMI) of 39.0 to 39.9 in adult, unspecified obesity type (HCC)  PLAN:  Vitamin D Deficiency Heidi Robinson was informed that low vitamin D levels contributes to fatigue and are associated with obesity, breast, and colon cancer. Heidi Robinson agrees to continue taking prescription  Vit D @50 ,000 IU every week #4 and we will refill for 1 month. She will follow up for routine testing of vitamin D, at least 2-3 times per year. She was informed of the risk of over-replacement of vitamin D and agrees to not increase her dose unless she discusses this with [place] first. Heidi Robinson agrees to follow up with our clinic in 3 to 4 weeks.  Pre-Diabetes Heidi Robinson will continue to work on weight loss, exercise, and decreasing simple carbohydrates in her diet to help decrease the risk of diabetes. We dicussed metformin including benefits and risks. She was  informed that eating too many simple carbohydrates or too many calories at one sitting increases the likelihood of GI side effects. Heidi Robinson agrees to continue taking metformin 500 mg BID #60 and we will refill for 1 month. Heidi Robinson agrees to follow up with our clinic in 3 to 4 weeks as directed to monitor her progress.  Diabetes risk counselling Heidi Robinson was given extended (15 minutes) diabetes prevention counseling today. She is 43 y.o. female and has risk factors for diabetes including obesity and pre-diabetes. We discussed intensive lifestyle modifications today with an emphasis on weight loss as well as increasing exercise and decreasing simple carbohydrates in her diet.  Iron Deficiency Anemia The diagnosis of Iron deficiency anemia was discussed with Heidi Robinson and was explained in detail. She was given suggestions of iron rich foods. Heidi Robinson agrees to continue taking Fesoy 325 mg q AM #30 and we will refill for 1 month. We will recheck labs in 3 months. Heidi Robinson agrees to follow up with our clinic in 3 to 4 weeks   Depression with Emotional Eating Behaviors We discussed behavior modification techniques today to help Heidi Robinson deal with her emotional eating and depression. Heidi Robinson agrees to continue taking Wellbutrin SR 200 mg qd #30 and we will refill for 1 month. Heidi Robinson agrees to follow up with our clinic in 3 to 4 weeks.  Obesity Heidi Robinson is currently in the action stage of change. As such, her goal is to continue with weight loss efforts She has agreed to follow the Category 2 plan Heidi Robinson has been instructed to work up to a goal of 150 minutes of combined cardio and strengthening exercise per week for weight loss and overall health benefits. We discussed the following Behavioral Modification Strategies today: increasing lean protein intake, decreasing simple carbohydrates  and work on meal planning and easy cooking plans   Heidi Robinson has agreed to follow up with our clinic in 3 to 4 weeks. She was informed of the importance of  frequent follow up visits to maximize her success with intensive lifestyle modifications for her multiple health conditions.  ALLERGIES: Allergies  Allergen Reactions  . Penicillins Anaphylaxis  . Food     Fish, Walnuts, Cashews, Almonds, Pecans, Blueberries, Heidi Robinson nuts, Pistachios, Macadamia Nuts, Coconuts, Chestnuts  . Latex     "Rash, hives maybe... I don't really remember"    MEDICATIONS: Current Outpatient Medications on File Prior to Visit  Medication Sig Dispense Refill  . albuterol (PROVENTIL) (2.5 MG/3ML) 0.083% nebulizer solution Inhale 2.5 mg into the lungs every 6 (six) hours as needed for wheezing or shortness of breath.    . lisinopril-hydrochlorothiazide (PRINZIDE,ZESTORETIC) 10-12.5 MG tablet TAKE 1 TABLET BY MOUTH ONCE DAILY 30 tablet 11  . meloxicam (MOBIC) 15 MG tablet Take 1 tablet (15 mg total) by mouth daily as needed for pain. 30 tablet 5  . Multiple Vitamin (MULTIVITAMINS PO) Take by mouth.    [name]  polyethylene glycol powder (GLYCOLAX/MIRALAX) powder Take 17 g by mouth daily. 3350 g 0  . traMADol (ULTRAM) 50 MG tablet Take by mouth every 6 (six) hours as needed.     No current facility-administered medications on file prior to visit.     PAST MEDICAL HISTORY: Past Medical History:  Diagnosis Date  . Allergy   . Anemia   . Arthritis   . Asthma   . Fibroids   . Hypertension   . Iron deficiency anemia due to chronic blood loss   . Obesity (BMI 30-39.9) 02/02/2012  . Sickle cell trait ([place]) 06/04/2014   hemoglobin electrophoresis confirmed    PAST SURGICAL HISTORY: Past Surgical History:  Procedure Laterality Date  . BREAST REDUCTION SURGERY Bilateral 11/03/2018   Procedure: BILATERAL BREAST REDUCTION WITH LIPOSUCTION;  Surgeon: Wallace Going, DO;  Location: [facility];  Service: Plastics;  Laterality: Bilateral;  . tonsillecomy    . TONSILLECTOMY  1997    SOCIAL HISTORY: Social History   Tobacco Use  . Smoking status: Former  Smoker    Years: 2.00    Types: Cigarettes    Last attempt to quit: 07/11/2003    Years since quitting: 15.3  . Smokeless tobacco: Never Used  Substance Use Topics  . Alcohol use: No    Alcohol/week: 0.0 standard drinks  . Drug use: No    FAMILY HISTORY: Family History  Problem Relation Age of Onset  . Hypertension Mother   . Anxiety disorder Mother   . Obesity Mother   . Kidney disease Brother   . Hypertension Maternal Grandmother   . Cancer Maternal Grandmother        bladder  . Hyperlipidemia Father   . Cancer Paternal Grandfather        prostate  . Breast cancer Neg Hx     ROS: Review of Systems  Constitutional: Positive for weight loss.  Cardiovascular: Negative for palpitations.  Gastrointestinal: Negative for nausea and vomiting.  Genitourinary: Negative for frequency.  Musculoskeletal:       Negative muscle weakness  Neurological:       Negative lightheadedness  Endo/Heme/Allergies: Negative for polydipsia.       Negative hypoglycemia  Psychiatric/Behavioral: Positive for depression. Negative for suicidal ideas.    PHYSICAL EXAM: Blood pressure 117/72, pulse 92, temperature 98.2 F (36.8 C), temperature source Oral, height 5\' 2"  (1.575 m), weight 217 lb (98.4 kg), last menstrual period 11/06/2018, SpO2 100 %. Body mass index is 39.69 kg/m. Physical Exam Vitals signs reviewed.  Constitutional:      Appearance: Normal appearance. She is obese.  Cardiovascular:     Rate and Rhythm: Normal rate.     Pulses: Normal pulses.  Pulmonary:     Effort: Pulmonary effort is normal.     Breath sounds: Normal breath sounds.  Musculoskeletal: Normal range of motion.  Skin:    General: Skin is warm and dry.  Neurological:     Mental Status: She is alert and oriented to person, place, and time.  Psychiatric:        Mood and Affect: Mood normal.        Behavior: Behavior normal.     RECENT LABS AND TESTS: BMET    Component Value Date/Time   NA 139  11/19/2018 1155   K 4.3 11/19/2018 1155   CL 100 11/19/2018 1155   CO2 22 11/19/2018 1155   GLUCOSE 108 (H) 11/19/2018 1155   GLUCOSE 83 10/31/2018 1445   BUN 8  11/19/2018 1155   CREATININE 0.77 11/19/2018 1155   CREATININE 0.80 07/24/2016 1238   CALCIUM 9.6 11/19/2018 1155   GFRNONAA 96 11/19/2018 1155   GFRAA 110 11/19/2018 1155   Lab Results  Component Value Date   HGBA1C 5.2 05/11/2018   HGBA1C 5.7 (H) 12/21/2017   HGBA1C 5.2 08/04/2017   HGBA1C 5.3 07/24/2016   HGBA1C 5.6 07/31/2015   Lab Results  Component Value Date   INSULIN 16.7 05/11/2018   INSULIN 23.4 12/21/2017   CBC    Component Value Date/Time   WBC 12.5 (H) 11/19/2018 1155   WBC 11.5 (H) 07/24/2016 1238   RBC 4.44 11/19/2018 1155   RBC 5.42 (H) 07/24/2016 1238   HGB 9.5 (L) 11/19/2018 1155   HCT 30.0 (L) 11/19/2018 1155   PLT 496 (H) 11/19/2018 1155   MCV 68 (L) 11/19/2018 1155   MCH 21.4 (L) 11/19/2018 1155   MCH 23.1 (L) 07/24/2016 1238   MCHC 31.7 11/19/2018 1155   MCHC 33.6 07/24/2016 1238   RDW 15.7 (H) 11/19/2018 1155   LYMPHSABS 2.0 05/11/2018 0745   MONOABS 0.5 06/04/2014 0828   EOSABS 0.3 05/11/2018 0745   BASOSABS 0.0 05/11/2018 0745   Iron/TIBC/Ferritin/ %Sat    Component Value Date/Time   FERRITIN 19 08/04/2017 1006   Lipid Panel     Component Value Date/Time   CHOL 173 05/11/2018 0745   TRIG 128 05/11/2018 0745   HDL 44 05/11/2018 0745   CHOLHDL 3.8 08/04/2017 1006   CHOLHDL 3.6 07/24/2016 1238   VLDL 27 07/24/2016 1238   LDLCALC 103 (H) 05/11/2018 0745   Hepatic Function Panel     Component Value Date/Time   PROT 6.5 11/19/2018 1155   ALBUMIN 4.1 11/19/2018 1155   AST 11 11/19/2018 1155   ALT 10 11/19/2018 1155   ALKPHOS 70 11/19/2018 1155   BILITOT <0.2 11/19/2018 1155      Component Value Date/Time   TSH 1.060 11/19/2018 1155   TSH 1.270 12/21/2017 0959   TSH 1.140 08/04/2017 1006      OBESITY BEHAVIORAL INTERVENTION VISIT  Today's visit was # 17    Starting weight: 258 lbs Starting date: 12/21/17 Today's weight : 217 lbs Today's date: 11/23/2018 Total lbs lost to date: 55    ASK: We discussed the diagnosis of obesity with Heidi Robinson today and Heidi Robinson agreed to give [place] permission to discuss obesity behavioral modification therapy today.  ASSESS: Teriyah has the diagnosis of obesity and her BMI today is 39.68 Heidi Robinson is in the action stage of change   ADVISE: Aliciana was educated on the multiple health risks of obesity as well as the benefit of weight loss to improve her health. She was advised of the need for long term treatment and the importance of lifestyle modifications to improve her current health and to decrease her risk of future health problems.  AGREE: Multiple dietary modification options and treatment options were discussed and  Saanya agreed to follow the recommendations documented in the above note.  ARRANGE: Teresa was educated on the importance of frequent visits to treat obesity as outlined per CMS and USPSTF guidelines and agreed to schedule her next follow up appointment today.  I, Trixie Dredge, am acting as transcriptionist for Dennard Nip, MD  I have reviewed the above documentation for accuracy and completeness, and I agree with the above. -Dennard Nip, MD

## 2018-12-13 ENCOUNTER — Ambulatory Visit (INDEPENDENT_AMBULATORY_CARE_PROVIDER_SITE_OTHER): Payer: Commercial Managed Care - PPO | Admitting: Plastic Surgery

## 2018-12-13 ENCOUNTER — Encounter: Payer: Self-pay | Admitting: Plastic Surgery

## 2018-12-13 VITALS — BP 108/77 | HR 96 | Temp 98.6°F | Ht 62.0 in | Wt 217.0 lb

## 2018-12-13 DIAGNOSIS — N62 Hypertrophy of breast: Secondary | ICD-10-CM

## 2018-12-13 NOTE — Progress Notes (Signed)
The patient is a 43 year old female here for follow-up on her bilateral breast reduction.  She had a small amount of skin breakdown on both breasts at the vertical horizontal juncture.  The right side is about a centimeter and a half and the left side 2-1/2 cm.  There does not appear to be an infection.  The area is clean and granulating well on both sides.  It does not appear to be opening up any further.  We had recommended collagen but she was not able to get it.  Today we placed donated acell.  She should do sink baths until next Tuesday morning.  And then she can shower and we will see her back in a week.  I encouraged her to continue with high-protein diet and a multivitamin.

## 2018-12-20 ENCOUNTER — Ambulatory Visit (INDEPENDENT_AMBULATORY_CARE_PROVIDER_SITE_OTHER): Payer: Commercial Managed Care - PPO | Admitting: Plastic Surgery

## 2018-12-20 ENCOUNTER — Encounter: Payer: Self-pay | Admitting: Plastic Surgery

## 2018-12-20 VITALS — BP 128/83 | HR 96 | Temp 99.0°F | Ht 62.0 in | Wt 217.0 lb

## 2018-12-20 DIAGNOSIS — N62 Hypertrophy of breast: Secondary | ICD-10-CM

## 2018-12-20 NOTE — Progress Notes (Signed)
   Subjective:    Patient ID: Heidi Robinson, female    DOB: 1976/07/22, 43 y.o.   MRN: [id number]  The patient is a 43 year old female here for follow-up on her bilateral breast reduction.  She had a small amount of breakdown at the bottom of the vertical limb.  She is doing extremely well it is healing in nicely, it is superficial.  There is no sign of infection.  She is pleased with her progress.  I explained that this is the typical place for wound breakdown after a large breast reduction.  This is where there is the most distressed with the suturing and the closure. Ordered  Review of Systems  Constitutional: Negative.   HENT: Negative.   Respiratory: Negative.   Gastrointestinal: Negative.   Endocrine: Negative.   Genitourinary: Negative.   Musculoskeletal: Negative.   Skin: Negative.        Objective:   Physical Exam Vitals signs and nursing note reviewed.  Constitutional:      Appearance: Normal appearance.  HENT:     Head: Normocephalic and atraumatic.  Cardiovascular:     Rate and Rhythm: Normal rate.  Pulmonary:     Effort: Pulmonary effort is normal.  Chest:    Abdominal:     General: Abdomen is flat.  Neurological:     Mental Status: She is alert.  Psychiatric:        Mood and Affect: Mood normal.        Thought Content: Thought content normal.        Judgment: Judgment normal.        Assessment & Plan:  Symptomatic mammary hypertrophy Donated a cell was applied.  She should add K-Y jelly once a day until Friday or Saturday.  She can then shower.  She should use Vaseline.  And see [place] next week.

## 2018-12-21 ENCOUNTER — Ambulatory Visit (INDEPENDENT_AMBULATORY_CARE_PROVIDER_SITE_OTHER): Payer: 59 | Admitting: Family Medicine

## 2018-12-21 ENCOUNTER — Encounter (INDEPENDENT_AMBULATORY_CARE_PROVIDER_SITE_OTHER): Payer: Self-pay | Admitting: Family Medicine

## 2018-12-21 VITALS — BP 117/80 | HR 94 | Temp 97.7°F | Ht 62.0 in | Wt 221.0 lb

## 2018-12-21 DIAGNOSIS — D508 Other iron deficiency anemias: Secondary | ICD-10-CM

## 2018-12-21 DIAGNOSIS — F3289 Other specified depressive episodes: Secondary | ICD-10-CM | POA: Diagnosis not present

## 2018-12-21 DIAGNOSIS — Z6841 Body Mass Index (BMI) 40.0 and over, adult: Secondary | ICD-10-CM

## 2018-12-21 DIAGNOSIS — R7303 Prediabetes: Secondary | ICD-10-CM

## 2018-12-21 DIAGNOSIS — E559 Vitamin D deficiency, unspecified: Secondary | ICD-10-CM

## 2018-12-21 DIAGNOSIS — Z9189 Other specified personal risk factors, not elsewhere classified: Secondary | ICD-10-CM | POA: Diagnosis not present

## 2018-12-21 MED ORDER — BUPROPION HCL ER (SR) 200 MG PO TB12: 200.0000 mg | ORAL_TABLET | Freq: Every day | ORAL | 0 refills | Status: DC

## 2018-12-21 MED ORDER — METFORMIN HCL 500 MG PO TABS: 500.0000 mg | ORAL_TABLET | Freq: Two times a day (BID) | ORAL | 0 refills | Status: DC

## 2018-12-21 MED ORDER — VITAMIN D (ERGOCALCIFEROL) 1.25 MG (50000 UNIT) PO CAPS: 50000.0000 [IU] | ORAL_CAPSULE | ORAL | 0 refills | Status: DC

## 2018-12-21 MED ORDER — FERROUS SULFATE 325 (65 FE) MG PO TABS: 325.0000 mg | ORAL_TABLET | Freq: Every day | ORAL | 0 refills | Status: DC

## 2018-12-22 NOTE — Progress Notes (Signed)
Office: [phone]  /  Fax: [phone]   HPI:   Chief Complaint: OBESITY Heidi Robinson is here to discuss her progress with her obesity treatment plan. She is on the Category 2 plan and is following her eating plan approximately 30 % of the time. She states she is exercising 0 minutes 0 times per week. Heidi Robinson is going back to work after surgery. She is making some indulgent choices. She can't put too much thought in what she is eating, so she would like to stick to Category 2 for the next 2 weeks.  Her weight is 221 lb (100.2 kg) today and has gained 4 pounds since her last visit. She has lost 37 lbs since starting treatment with [place].  Iron Deficiency Anemia Heidi Robinson has a diagnosis of anemia. Her MCV is low and H/H post operation. She is on iron supplementation.    Pre-Diabetes Heidi Robinson has a diagnosis of pre-diabetes based on her elevated Hgb A1c and was informed this puts her at greater risk of developing diabetes. She notes occasional carbohydrate cravings and denies GI side effects of metformin. She continues to work on diet and exercise to decrease risk of diabetes. She denies nausea or hypoglycemia.  At risk for diabetes Heidi Robinson is at higher than average risk for developing diabetes due to her obesity and pre-diabetes. She currently denies polyuria or polydipsia.  Vitamin D Deficiency Heidi Robinson has a diagnosis of vitamin D deficiency. She is currently taking prescription Vit D. She notes fatigue and denies nausea, vomiting or muscle weakness.  Depression with emotional eating behaviors Heidi Robinson's blood pressure is controlled and she notes improved symptoms. Heidi Robinson struggles with emotional eating and using food for comfort to the extent that it is negatively impacting her health. She often snacks when she is not hungry. Heidi Robinson sometimes feels she is out of control and then feels guilty that she made poor food choices. She has been working on behavior modification techniques to help reduce her emotional eating and  has been somewhat successful. She shows no sign of suicidal or homicidal ideations.  Depression screen [facility] 2/9 11/19/2018 08/06/2018 12/21/2017 11/06/2017 08/04/2017  Decreased Interest 0 0 3 0 0  Down, Depressed, Hopeless 0 0 3 0 0  PHQ - 2 Score 0 0 6 0 0  Altered sleeping - - 2 - -  Tired, decreased energy - - 3 - -  Change in appetite - - 2 - -  Feeling bad or failure about yourself  - - 2 - -  Trouble concentrating - - 3 - -  Moving slowly or fidgety/restless - - 2 - -  Suicidal thoughts - - 0 - -  PHQ-9 Score - - 20 - -  Difficult doing work/chores - - Somewhat difficult - -    ASSESSMENT AND PLAN:  Other iron deficiency anemia - Plan: ferrous sulfate 325 (65 FE) MG tablet  Prediabetes - Plan: metFORMIN (GLUCOPHAGE) 500 MG tablet  Vitamin D deficiency - Plan: Vitamin D, Ergocalciferol, (DRISDOL) 1.25 MG (50000 UT) CAPS capsule  Other depression - with emotional eating - Plan: buPROPion (WELLBUTRIN SR) 200 MG 12 hr tablet  At risk for diabetes mellitus  Class 3 severe obesity with serious comorbidity and body mass index (BMI) of 40.0 to 44.9 in adult, unspecified obesity type (HCC)  PLAN:  Iron Deficiency Anemia The diagnosis of Iron deficiency anemia was discussed with Heidi Robinson and was explained in detail. She was given suggestions of iron rich foods. Heidi Robinson agrees to continue taking ferrous sulfate  325 mg PO daily #30 and we will refill for 1 month. Heidi Robinson agrees to follow up with our clinic in 3 to 4 weeks.  Pre-Diabetes Heidi Robinson will continue to work on weight loss, exercise, and decreasing simple carbohydrates in her diet to help decrease the risk of diabetes. We dicussed metformin including benefits and risks. She was informed that eating too many simple carbohydrates or too many calories at one sitting increases the likelihood of GI side effects. Heidi Robinson agrees to continue taking metformin 500 mg PO BID with meal #60 and we will refill for 1 month. Heidi Robinson agrees to follow up with our  clinic in 3 to 4 weeks as directed to monitor her progress.  Diabetes risk counseling Heidi Robinson was given extended (30 minutes) diabetes prevention counseling today. She is 43 y.o. female and has risk factors for diabetes including obesity and pre-diabetes. We discussed intensive lifestyle modifications today with an emphasis on weight loss as well as increasing exercise and decreasing simple carbohydrates in her diet.  Vitamin D Deficiency Heidi Robinson was informed that low vitamin D levels contributes to fatigue and are associated with obesity, breast, and colon cancer. Heidi Robinson agrees to continue taking prescription Vit D @50 ,000 IU every week #4 and we will refill for 1 month. She will follow up for routine testing of vitamin D, at least 2-3 times per year. She was informed of the risk of over-replacement of vitamin D and agrees to not increase her dose unless she discusses this with [place] first. Heidi Robinson agrees to follow up with our clinic in 3 to 4 weeks.  Depression with Emotional Eating Behaviors We discussed behavior modification techniques today to help Aza deal with her emotional eating and depression. Heidi Robinson agrees to continue taking Wellbutrin SR 200 mg PO daily #30 and we will refill for 1 month. Heidi Robinson agrees to follow up with our clinic in 3 to 4 weeks.  Obesity Heidi Robinson is currently in the action stage of change. As such, her goal is to continue with weight loss efforts She has agreed to follow the Category 2 plan Heidi Robinson has been instructed to work up to a goal of 150 minutes of combined cardio and strengthening exercise per week for weight loss and overall health benefits. We discussed the following Behavioral Modification Strategies today: increasing lean protein intake, increasing vegetables and work on meal planning and easy cooking plans, and planning for success   Heidi Robinson has agreed to follow up with our clinic in 3 to 4 weeks. She was informed of the importance of frequent follow up visits to maximize her  success with intensive lifestyle modifications for her multiple health conditions.  ALLERGIES: Allergies  Allergen Reactions  . Penicillins Anaphylaxis  . Food     Fish, Walnuts, Cashews, Almonds, Pecans, Blueberries, [place] nuts, Pistachios, Macadamia Nuts, Coconuts, Chestnuts  . Latex     "Rash, hives maybe... I don't really remember"    MEDICATIONS: Current Outpatient Medications on File Prior to Visit  Medication Sig Dispense Refill  . albuterol (PROVENTIL) (2.5 MG/3ML) 0.083% nebulizer solution Inhale 2.5 mg into the lungs every 6 (six) hours as needed for wheezing or shortness of breath.    . lisinopril-hydrochlorothiazide (PRINZIDE,ZESTORETIC) 10-12.5 MG tablet TAKE 1 TABLET BY MOUTH ONCE DAILY 30 tablet 11  . meloxicam (MOBIC) 15 MG tablet Take 1 tablet (15 mg total) by mouth daily as needed for pain. 30 tablet 5  . Multiple Vitamin (MULTIVITAMINS PO) Take by mouth.    . polyethylene glycol powder (GLYCOLAX/MIRALAX) powder  Take 17 g by mouth daily. 3350 g 0  . traMADol (ULTRAM) 50 MG tablet Take by mouth every 6 (six) hours as needed.     No current facility-administered medications on file prior to visit.     PAST MEDICAL HISTORY: Past Medical History:  Diagnosis Date  . Allergy   . Anemia   . Arthritis   . Asthma   . Fibroids   . Hypertension   . Iron deficiency anemia due to chronic blood loss   . Obesity (BMI 30-39.9) 02/02/2012  . Sickle cell trait ([place]) 06/04/2014   hemoglobin electrophoresis confirmed    PAST SURGICAL HISTORY: Past Surgical History:  Procedure Laterality Date  . BREAST REDUCTION SURGERY Bilateral 11/03/2018   Procedure: BILATERAL BREAST REDUCTION WITH LIPOSUCTION;  Surgeon: Wallace Going, DO;  Location: [facility];  Service: Plastics;  Laterality: Bilateral;  . tonsillecomy    . TONSILLECTOMY  1997    SOCIAL HISTORY: Social History   Tobacco Use  . Smoking status: Former Smoker    Years: 2.00    Types:  Cigarettes    Last attempt to quit: 07/11/2003    Years since quitting: 15.4  . Smokeless tobacco: Never Used  Substance Use Topics  . Alcohol use: No    Alcohol/week: 0.0 standard drinks  . Drug use: No    FAMILY HISTORY: Family History  Problem Relation Age of Onset  . Hypertension Mother   . Anxiety disorder Mother   . Obesity Mother   . Kidney disease Brother   . Hypertension Maternal Grandmother   . Cancer Maternal Grandmother        bladder  . Hyperlipidemia Father   . Cancer Paternal Grandfather        prostate  . Breast cancer Neg Hx     ROS: Review of Systems  Constitutional: Positive for malaise/fatigue. Negative for weight loss.  Gastrointestinal: Negative for nausea and vomiting.  Genitourinary: Negative for frequency.  Musculoskeletal:       Negative muscle weakness  Endo/Heme/Allergies: Negative for polydipsia.       Negative hypoglycemia  Psychiatric/Behavioral: Positive for depression. Negative for suicidal ideas.    PHYSICAL EXAM: Blood pressure 117/80, pulse 94, temperature 97.7 F (36.5 C), temperature source Oral, height 5\' 2"  (1.575 m), weight 221 lb (100.2 kg), last menstrual period 12/04/2018, SpO2 99 %. Body mass index is 40.42 kg/m. Physical Exam Vitals signs reviewed.  Constitutional:      Appearance: Normal appearance. She is obese.  Cardiovascular:     Rate and Rhythm: Normal rate.     Pulses: Normal pulses.  Pulmonary:     Effort: Pulmonary effort is normal.     Breath sounds: Normal breath sounds.  Musculoskeletal: Normal range of motion.  Skin:    General: Skin is warm and dry.  Neurological:     Mental Status: She is alert and oriented to person, place, and time.  Psychiatric:        Mood and Affect: Mood normal.        Behavior: Behavior normal.     RECENT LABS AND TESTS: BMET    Component Value Date/Time   NA 139 11/19/2018 1155   K 4.3 11/19/2018 1155   CL 100 11/19/2018 1155   CO2 22 11/19/2018 1155   GLUCOSE  108 (H) 11/19/2018 1155   GLUCOSE 83 10/31/2018 1445   BUN 8 11/19/2018 1155   CREATININE 0.77 11/19/2018 1155   CREATININE 0.80 07/24/2016 1238   CALCIUM  9.6 11/19/2018 1155   GFRNONAA 96 11/19/2018 1155   GFRAA 110 11/19/2018 1155   Lab Results  Component Value Date   HGBA1C 5.2 05/11/2018   HGBA1C 5.7 (H) 12/21/2017   HGBA1C 5.2 08/04/2017   HGBA1C 5.3 07/24/2016   HGBA1C 5.6 07/31/2015   Lab Results  Component Value Date   INSULIN 16.7 05/11/2018   INSULIN 23.4 12/21/2017   CBC    Component Value Date/Time   WBC 12.5 (H) 11/19/2018 1155   WBC 11.5 (H) 07/24/2016 1238   RBC 4.44 11/19/2018 1155   RBC 5.42 (H) 07/24/2016 1238   HGB 9.5 (L) 11/19/2018 1155   HCT 30.0 (L) 11/19/2018 1155   PLT 496 (H) 11/19/2018 1155   MCV 68 (L) 11/19/2018 1155   MCH 21.4 (L) 11/19/2018 1155   MCH 23.1 (L) 07/24/2016 1238   MCHC 31.7 11/19/2018 1155   MCHC 33.6 07/24/2016 1238   RDW 15.7 (H) 11/19/2018 1155   LYMPHSABS 2.0 05/11/2018 0745   MONOABS 0.5 06/04/2014 0828   EOSABS 0.3 05/11/2018 0745   BASOSABS 0.0 05/11/2018 0745   Iron/TIBC/Ferritin/ %Sat    Component Value Date/Time   FERRITIN 19 08/04/2017 1006   Lipid Panel     Component Value Date/Time   CHOL 173 05/11/2018 0745   TRIG 128 05/11/2018 0745   HDL 44 05/11/2018 0745   CHOLHDL 3.8 08/04/2017 1006   CHOLHDL 3.6 07/24/2016 1238   VLDL 27 07/24/2016 1238   LDLCALC 103 (H) 05/11/2018 0745   Hepatic Function Panel     Component Value Date/Time   PROT 6.5 11/19/2018 1155   ALBUMIN 4.1 11/19/2018 1155   AST 11 11/19/2018 1155   ALT 10 11/19/2018 1155   ALKPHOS 70 11/19/2018 1155   BILITOT <0.2 11/19/2018 1155      Component Value Date/Time   TSH 1.060 11/19/2018 1155   TSH 1.270 12/21/2017 0959   TSH 1.140 08/04/2017 1006      OBESITY BEHAVIORAL INTERVENTION VISIT  Today's visit was # 18   Starting weight: 258 lbs Starting date: 12/21/17 Today's weight : 221 lbs  Today's date:  12/21/2018 Total lbs lost to date: 65    ASK: We discussed the diagnosis of obesity with Heidi Robinson today and Heidi Robinson agreed to give [place] permission to discuss obesity behavioral modification therapy today.  ASSESS: Heidi Robinson has the diagnosis of obesity and her BMI today is 40.41 Heidi Robinson is in the action stage of change   ADVISE: Heidi Robinson was educated on the multiple health risks of obesity as well as the benefit of weight loss to improve her health. She was advised of the need for long term treatment and the importance of lifestyle modifications to improve her current health and to decrease her risk of future health problems.  AGREE: Multiple dietary modification options and treatment options were discussed and  Heidi Robinson agreed to follow the recommendations documented in the above note.  ARRANGE: Heidi Robinson was educated on the importance of frequent visits to treat obesity as outlined per CMS and USPSTF guidelines and agreed to schedule her next follow up appointment today.  I, Trixie Dredge, am acting as transcriptionist for Ilene Qua, MD  I have reviewed the above documentation for accuracy and completeness, and I agree with the above. - Ilene Qua, MD

## 2018-12-27 ENCOUNTER — Ambulatory Visit (INDEPENDENT_AMBULATORY_CARE_PROVIDER_SITE_OTHER): Payer: Commercial Managed Care - PPO | Admitting: Plastic Surgery

## 2018-12-27 ENCOUNTER — Encounter: Payer: Self-pay | Admitting: Plastic Surgery

## 2018-12-27 VITALS — BP 115/81 | HR 95 | Temp 99.1°F | Ht 62.0 in | Wt 221.0 lb

## 2018-12-27 DIAGNOSIS — N62 Hypertrophy of breast: Secondary | ICD-10-CM

## 2018-12-27 NOTE — Progress Notes (Signed)
   Subjective:    Patient ID: KESTREL MIS, female    DOB: 23-Oct-1976, 43 y.o.   MRN: [id number]  The patient is a 43 year old female here for follow-up on her bilateral breast reduction.  She had some areas of wound breakdown and has been treating the area.  It has been getting better.  It does not appear to be infected. The right is 5 mm and nearly healed. Most of the area has epithelialized.  The left side is 7 mm and to level.     Review of Systems  Constitutional: Negative.   HENT: Negative.   Respiratory: Negative.   Genitourinary: Negative.   Musculoskeletal: Negative.   Skin: Positive for wound.       Objective:   Physical Exam Vitals signs and nursing note reviewed.  HENT:     Head: Normocephalic and atraumatic.  Cardiovascular:     Rate and Rhythm: Normal rate.  Pulmonary:     Effort: Pulmonary effort is normal.  Chest:    Neurological:     General: No focal deficit present.     Mental Status: She is alert.  Psychiatric:        Mood and Affect: Mood normal.        Thought Content: Thought content normal.        Judgment: Judgment normal.       Assessment & Plan:  Symptomatic mammary hypertrophy Vaseline to the right breast area daily.  KY to the left for one more week.  Continue with high protein diet for healing.

## 2019-01-10 ENCOUNTER — Ambulatory Visit: Payer: Commercial Managed Care - PPO | Admitting: Plastic Surgery

## 2019-01-17 ENCOUNTER — Ambulatory Visit (INDEPENDENT_AMBULATORY_CARE_PROVIDER_SITE_OTHER): Payer: 59 | Admitting: Family Medicine

## 2019-01-17 ENCOUNTER — Ambulatory Visit (INDEPENDENT_AMBULATORY_CARE_PROVIDER_SITE_OTHER): Payer: Commercial Managed Care - PPO | Admitting: Plastic Surgery

## 2019-01-17 ENCOUNTER — Encounter: Payer: Self-pay | Admitting: Plastic Surgery

## 2019-01-17 VITALS — BP 122/88 | HR 96 | Temp 99.3°F | Ht 62.0 in | Wt 221.0 lb

## 2019-01-17 DIAGNOSIS — N62 Hypertrophy of breast: Secondary | ICD-10-CM

## 2019-01-17 NOTE — Progress Notes (Signed)
   Subjective:    Patient ID: Heidi Robinson, female    DOB: Jul 09, 1976, 43 y.o.   MRN: [id number]  The patient is a 43 year old female here for follow-up on her bilateral breast reduction.  She had a small amount of skin breakdown on the left inframammary fold.  She had donated ACell applied.  She was also using some [place].  The area has completely healed.  She is very pleased with the result.  There is some firmness on the left breast at the juncture of the vertical and horizontal incision.   Review of Systems  Constitutional: Negative.   HENT: Negative.   Respiratory: Negative.   Cardiovascular: Negative.   Musculoskeletal: Negative.   Skin: Negative.        Objective:   Physical Exam Vitals signs and nursing note reviewed.  Constitutional:      Appearance: Normal appearance.  HENT:     Head: Normocephalic.  Cardiovascular:     Rate and Rhythm: Normal rate.  Pulmonary:     Effort: Pulmonary effort is normal.  Neurological:     Mental Status: She is alert.  Psychiatric:        Mood and Affect: Mood normal.        Thought Content: Thought content normal.        Judgment: Judgment normal.         Assessment & Plan:  Symptomatic mammary hypertrophy  Massage to the left breast.  And she can start using Mederma.  She is okay to start working out.

## 2019-01-18 ENCOUNTER — Other Ambulatory Visit: Payer: Self-pay

## 2019-01-18 ENCOUNTER — Encounter (INDEPENDENT_AMBULATORY_CARE_PROVIDER_SITE_OTHER): Payer: Self-pay | Admitting: Family Medicine

## 2019-01-18 ENCOUNTER — Ambulatory Visit (INDEPENDENT_AMBULATORY_CARE_PROVIDER_SITE_OTHER): Payer: 59 | Admitting: Family Medicine

## 2019-01-18 VITALS — BP 107/73 | HR 86 | Ht 62.0 in | Wt 223.0 lb

## 2019-01-18 DIAGNOSIS — Z9189 Other specified personal risk factors, not elsewhere classified: Secondary | ICD-10-CM | POA: Diagnosis not present

## 2019-01-18 DIAGNOSIS — Z6841 Body Mass Index (BMI) 40.0 and over, adult: Secondary | ICD-10-CM

## 2019-01-18 DIAGNOSIS — E559 Vitamin D deficiency, unspecified: Secondary | ICD-10-CM

## 2019-01-18 DIAGNOSIS — R7303 Prediabetes: Secondary | ICD-10-CM

## 2019-01-18 MED ORDER — NALTREXONE-BUPROPION HCL ER 8-90 MG PO TB12: ORAL_TABLET | ORAL | 0 refills | Status: DC

## 2019-01-18 MED ORDER — VITAMIN D (ERGOCALCIFEROL) 1.25 MG (50000 UNIT) PO CAPS: 50000.0000 [IU] | ORAL_CAPSULE | ORAL | 0 refills | Status: DC

## 2019-01-18 MED ORDER — METFORMIN HCL 500 MG PO TABS: 500.0000 mg | ORAL_TABLET | Freq: Two times a day (BID) | ORAL | 0 refills | Status: DC

## 2019-01-19 NOTE — Progress Notes (Signed)
Office: [phone]  /  Fax: [phone]   HPI:   Chief Complaint: OBESITY Chatara is here to discuss her progress with her obesity treatment plan. She is on the Category 2 plan and is following her eating plan approximately 30 % of the time. She states she is exercising 0 minutes 0 times per week. Kyli is still struggling with emotional eating and [place] very bored with her plan. She is not doing well with the meal plan.   Her weight is 223 lb (101.2 kg) today and has had a weight gain of 2 pounds over a period of 4 weeks since her last visit. She has lost 35 lbs since starting treatment with [place].  Pre-Diabetes Reda has a diagnosis of pre-diabetes based on her elevated Hgb A1c and was informed this puts her at greater risk of developing diabetes. She is struggling with polyphagia especially with increased simple carbs. She is taking metformin currently and continues to work on diet and exercise to decrease risk of diabetes.   Vitamin D Deficiency Mekaila has a diagnosis of vitamin D deficiency. She is currently stable on vit D. Jesi denies nausea, vomiting, or muscle weakness.  At risk for osteopenia and osteoporosis Shealynn is at higher risk of osteopenia and osteoporosis due to vitamin D deficiency.   ASSESSMENT AND PLAN:  Prediabetes - Plan: metFORMIN (GLUCOPHAGE) 500 MG tablet  Vitamin D deficiency - Plan: Vitamin D, Ergocalciferol, (DRISDOL) 1.25 MG (50000 UT) CAPS capsule  At risk for osteoporosis  Class 3 severe obesity with serious comorbidity and body mass index (BMI) of 40.0 to 44.9 in adult, unspecified obesity type ([place]) - Plan: Naltrexone-buPROPion HCl ER (CONTRAVE) 8-90 MG TB12  PLAN:  Pre-Diabetes Margeart will continue to work on weight loss, exercise, and decreasing simple carbohydrates in her diet to help decrease the risk of diabetes. She was informed that eating too many simple carbohydrates or too many calories at one sitting increases the likelihood of GI side effects. Amandy  agreed to get back to her diet prescription and to continue metformin 500 mg BID #60 with no refills and a prescription was written today. Mirtha agreed to follow up with [place] as directed to monitor her progress in 3 weeks.   Vitamin D Deficiency Edin was informed that low vitamin D levels contributes to fatigue and are associated with obesity, breast, and colon cancer. Sister agrees to continue to take prescription Vit D @50 ,000 IU every week #4 with no refills and will follow up for routine testing of vitamin D, at least 2-3 times per year. She was informed of the risk of over-replacement of vitamin D and agrees to not increase her dose unless she discusses this with [place] first. Wilder agrees to follow up in 3 weeks as directed.  At risk for osteopenia and osteoporosis Kawanna was given extended (15 minutes) osteoporosis prevention counseling today. Aspynn is at risk for osteopenia and osteoporosis due to her vitamin D deficiency. She was encouraged to take her vitamin D and follow her higher calcium diet and increase strengthening exercise to help strengthen her bones and decrease her risk of osteopenia and osteoporosis.  Obesity Calynn is currently in the action stage of change. As such, her goal is to continue with weight loss efforts. She has agreed to follow the Category 2 plan. Saphia has been instructed to work up to a goal of 150 minutes of combined cardio and strengthening exercise per week for weight loss and overall health benefits. We discussed the following  Behavioral Modification Strategies today: increasing lean protein intake, decreasing simple carbohydrates , increasing vegetables, work on meal planning and easy cooking plans, emotional eating strategies, planning for success, and avoiding temptations. We discussed various medications and Laterica agreed to hold Wellbutrin and she will start Contrave 8-90 mg, 2 tablets BID #120 with no refills. Venera will follow up as directed.  Garry has agreed to follow  up with our clinic in 3 weeks. She was informed of the importance of frequent follow up visits to maximize her success with intensive lifestyle modifications for her multiple health conditions.  ALLERGIES: Allergies  Allergen Reactions   Penicillins Anaphylaxis   Food     Fish, Walnuts, Cashews, Almonds, Pecans, Blueberries, Pine nuts, Pistachios, Macadamia Nuts, Coconuts, Chestnuts   Latex     "Rash, hives maybe... I don't really remember"    MEDICATIONS: Current Outpatient Medications on File Prior to Visit  Medication Sig Dispense Refill   albuterol (PROVENTIL) (2.5 MG/3ML) 0.083% nebulizer solution Inhale 2.5 mg into the lungs every 6 (six) hours as needed for wheezing or shortness of breath.     buPROPion (WELLBUTRIN SR) 200 MG 12 hr tablet Take 1 tablet (200 mg total) by mouth daily. 30 tablet 0   ferrous sulfate 325 (65 FE) MG tablet Take 1 tablet (325 mg total) by mouth daily with breakfast. 30 tablet 0   lisinopril-hydrochlorothiazide (PRINZIDE,ZESTORETIC) 10-12.5 MG tablet TAKE 1 TABLET BY MOUTH ONCE DAILY 30 tablet 11   meloxicam (MOBIC) 15 MG tablet Take 1 tablet (15 mg total) by mouth daily as needed for pain. 30 tablet 5   Multiple Vitamin (MULTIVITAMINS PO) Take by mouth.     polyethylene glycol powder (GLYCOLAX/MIRALAX) powder Take 17 g by mouth daily. 3350 g 0   traMADol (ULTRAM) 50 MG tablet Take by mouth every 6 (six) hours as needed.     No current facility-administered medications on file prior to visit.     PAST MEDICAL HISTORY: Past Medical History:  Diagnosis Date   Allergy    Anemia    Arthritis    Asthma    Fibroids    Hypertension    Iron deficiency anemia due to chronic blood loss    Obesity (BMI 30-39.9) 02/02/2012   Sickle cell trait ([place]) 06/04/2014   hemoglobin electrophoresis confirmed    PAST SURGICAL HISTORY: Past Surgical History:  Procedure Laterality Date   BREAST REDUCTION SURGERY Bilateral 11/03/2018    Procedure: BILATERAL BREAST REDUCTION WITH LIPOSUCTION;  Surgeon: Wallace Going, DO;  Location: [place];  Service: Plastics;  Laterality: Bilateral;   tonsillecomy     TONSILLECTOMY  1997    SOCIAL HISTORY: Social History   Tobacco Use   Smoking status: Former Smoker    Years: 2.00    Types: Cigarettes    Last attempt to quit: 07/11/2003    Years since quitting: 15.5   Smokeless tobacco: Never Used  Substance Use Topics   Alcohol use: No    Alcohol/week: 0.0 standard drinks   Drug use: No    FAMILY HISTORY: Family History  Problem Relation Age of Onset   Hypertension Mother    Anxiety disorder Mother    Obesity Mother    Kidney disease Brother    Hypertension Maternal Grandmother    Cancer Maternal Grandmother        bladder   Hyperlipidemia Father    Cancer Paternal Grandfather        prostate   Breast cancer Neg Hx  ROS: Review of Systems  Constitutional: Negative for weight loss.  Gastrointestinal: Negative for nausea and vomiting.  Musculoskeletal:       Negative for muscle weakness.  Endo/Heme/Allergies:       Positive for polyphagia.   PHYSICAL EXAM: Blood pressure 107/73, pulse 86, height 5\' 2"  (1.575 m), weight 223 lb (101.2 kg), last menstrual period 01/03/2019, SpO2 98 %. Body mass index is 40.79 kg/m. Physical Exam Vitals signs reviewed.  Constitutional:      Appearance: Normal appearance. She is obese.  Cardiovascular:     Rate and Rhythm: Normal rate.  Pulmonary:     Effort: Pulmonary effort is normal.  Musculoskeletal: Normal range of motion.  Skin:    General: Skin is warm and dry.  Neurological:     Mental Status: She is alert and oriented to person, place, and time.  Psychiatric:        Mood and Affect: Mood normal.        Behavior: Behavior normal.    RECENT LABS AND TESTS: BMET    Component Value Date/Time   NA 139 11/19/2018 1155   K 4.3 11/19/2018 1155   CL 100 11/19/2018 1155   CO2  22 11/19/2018 1155   GLUCOSE 108 (H) 11/19/2018 1155   GLUCOSE 83 10/31/2018 1445   BUN 8 11/19/2018 1155   CREATININE 0.77 11/19/2018 1155   CREATININE 0.80 07/24/2016 1238   CALCIUM 9.6 11/19/2018 1155   GFRNONAA 96 11/19/2018 1155   GFRAA 110 11/19/2018 1155   Lab Results  Component Value Date   HGBA1C 5.2 05/11/2018   HGBA1C 5.7 (H) 12/21/2017   HGBA1C 5.2 08/04/2017   HGBA1C 5.3 07/24/2016   HGBA1C 5.6 07/31/2015   Lab Results  Component Value Date   INSULIN 16.7 05/11/2018   INSULIN 23.4 12/21/2017   CBC    Component Value Date/Time   WBC 12.5 (H) 11/19/2018 1155   WBC 11.5 (H) 07/24/2016 1238   RBC 4.44 11/19/2018 1155   RBC 5.42 (H) 07/24/2016 1238   HGB 9.5 (L) 11/19/2018 1155   HCT 30.0 (L) 11/19/2018 1155   PLT 496 (H) 11/19/2018 1155   MCV 68 (L) 11/19/2018 1155   MCH 21.4 (L) 11/19/2018 1155   MCH 23.1 (L) 07/24/2016 1238   MCHC 31.7 11/19/2018 1155   MCHC 33.6 07/24/2016 1238   RDW 15.7 (H) 11/19/2018 1155   LYMPHSABS 2.0 05/11/2018 0745   MONOABS 0.5 06/04/2014 0828   EOSABS 0.3 05/11/2018 0745   BASOSABS 0.0 05/11/2018 0745   Iron/TIBC/Ferritin/ %Sat    Component Value Date/Time   FERRITIN 19 08/04/2017 1006   Lipid Panel     Component Value Date/Time   CHOL 173 05/11/2018 0745   TRIG 128 05/11/2018 0745   HDL 44 05/11/2018 0745   CHOLHDL 3.8 08/04/2017 1006   CHOLHDL 3.6 07/24/2016 1238   VLDL 27 07/24/2016 1238   LDLCALC 103 (H) 05/11/2018 0745   Hepatic Function Panel     Component Value Date/Time   PROT 6.5 11/19/2018 1155   ALBUMIN 4.1 11/19/2018 1155   AST 11 11/19/2018 1155   ALT 10 11/19/2018 1155   ALKPHOS 70 11/19/2018 1155   BILITOT <0.2 11/19/2018 1155      Component Value Date/Time   TSH 1.060 11/19/2018 1155   TSH 1.270 12/21/2017 0959   TSH 1.140 08/04/2017 1006   Results for FREEDA, SPIVEY (MRN [id number]) as of 01/19/2019 06:52  Ref. Range 05/11/2018 07:45  Vitamin D, 25-Hydroxy Latest Ref  Range: 30.0 - 100.0  ng/mL 46.7   OBESITY BEHAVIORAL INTERVENTION VISIT  Today's visit was # 19   Starting weight: 258 lbs Starting date: 12/21/17 Today's weight : Weight: 223 lb (101.2 kg)  Today's date: 01/18/2019 Total lbs lost to date: 35   01/18/2019  Height 5\' 2"  (1.575 m)  Weight 223 lb (101.2 kg)  BMI (Calculated) 40.78  BLOOD PRESSURE - SYSTOLIC 130  BLOOD PRESSURE - DIASTOLIC 73   Body Fat % 86.5 %  Total Body Water (lbs) 84.6 lbs   ASK: We discussed the diagnosis of obesity with Cristie Hem today and Safire agreed to give [place] permission to discuss obesity behavioral modification therapy today.  ASSESS: Evita has the diagnosis of obesity and her BMI today is 40.78. Rashel is in the action stage of change.   ADVISE: Kirandeep was educated on the multiple health risks of obesity as well as the benefit of weight loss to improve her health. She was advised of the need for long term treatment and the importance of lifestyle modifications to improve her current health and to decrease her risk of future health problems.  AGREE: Multiple dietary modification options and treatment options were discussed and Amayra agreed to follow the recommendations documented in the above note.  ARRANGE: Natarsha was educated on the importance of frequent visits to treat obesity as outlined per CMS and USPSTF guidelines and agreed to schedule her next follow up appointment today.  IMarcille Blanco, CMA, am acting as transcriptionist for Starlyn Skeans, MD  I have reviewed the above documentation for accuracy and completeness, and I agree with the above. -Dennard Nip, MD

## 2019-02-01 ENCOUNTER — Encounter (INDEPENDENT_AMBULATORY_CARE_PROVIDER_SITE_OTHER): Payer: Self-pay

## 2019-02-01 ENCOUNTER — Encounter (INDEPENDENT_AMBULATORY_CARE_PROVIDER_SITE_OTHER): Payer: Self-pay | Admitting: Family Medicine

## 2019-02-02 ENCOUNTER — Encounter (INDEPENDENT_AMBULATORY_CARE_PROVIDER_SITE_OTHER): Payer: Self-pay

## 2019-02-08 ENCOUNTER — Ambulatory Visit (INDEPENDENT_AMBULATORY_CARE_PROVIDER_SITE_OTHER): Payer: 59 | Admitting: Family Medicine

## 2019-02-08 ENCOUNTER — Other Ambulatory Visit: Payer: Self-pay

## 2019-02-08 ENCOUNTER — Encounter (INDEPENDENT_AMBULATORY_CARE_PROVIDER_SITE_OTHER): Payer: Self-pay | Admitting: Family Medicine

## 2019-02-08 DIAGNOSIS — D508 Other iron deficiency anemias: Secondary | ICD-10-CM | POA: Diagnosis not present

## 2019-02-08 DIAGNOSIS — E559 Vitamin D deficiency, unspecified: Secondary | ICD-10-CM

## 2019-02-08 DIAGNOSIS — F3289 Other specified depressive episodes: Secondary | ICD-10-CM | POA: Diagnosis not present

## 2019-02-08 DIAGNOSIS — E66813 Obesity, class 3: Secondary | ICD-10-CM

## 2019-02-08 DIAGNOSIS — R7303 Prediabetes: Secondary | ICD-10-CM | POA: Diagnosis not present

## 2019-02-08 DIAGNOSIS — Z6841 Body Mass Index (BMI) 40.0 and over, adult: Secondary | ICD-10-CM

## 2019-02-09 MED ORDER — BUPROPION HCL ER (SR) 200 MG PO TB12: 200.0000 mg | ORAL_TABLET | Freq: Two times a day (BID) | ORAL | 0 refills | Status: DC

## 2019-02-09 MED ORDER — VITAMIN D (ERGOCALCIFEROL) 1.25 MG (50000 UNIT) PO CAPS: 50000.0000 [IU] | ORAL_CAPSULE | ORAL | 0 refills | Status: DC

## 2019-02-09 MED ORDER — METFORMIN HCL 500 MG PO TABS: 500.0000 mg | ORAL_TABLET | Freq: Two times a day (BID) | ORAL | 0 refills | Status: DC

## 2019-02-09 MED ORDER — FERROUS SULFATE 325 (65 FE) MG PO TABS: 325.0000 mg | ORAL_TABLET | Freq: Every day | ORAL | 0 refills | Status: DC

## 2019-02-09 NOTE — Progress Notes (Signed)
Office: [phone]  /  Fax: [phone] TeleHealth Visit:  Heidi Robinson has verbally consented to this TeleHealth visit today. The patient is located at home, the provider is located at the [facility] office. The participants in this visit include the listed provider and patient. The visit was conducted today via face time.  HPI:   Chief Complaint: OBESITY Heidi Robinson is here to discuss her progress with her obesity treatment plan. She is on the Category 2 plan and is following her eating plan approximately 25 % of the time. She states she is working for 30-40 minutes 3 times per week. Heidi Robinson has been struggling with increased stress and not following her plan closely. She thinks she has maintained her weight. She is unhappy in her job and tempted to stress eat more frequently.  We were unable to weigh the patient today for this TeleHealth visit. She feels as if she has maintained her weight since her last visit. She has lost 35 lbs since starting treatment with [place].  Vitamin D Deficiency Heidi Robinson has a diagnosis of vitamin D deficiency. She is currently taking prescription Vit D, and level is almost at goal. She denies nausea, vomiting or muscle weakness.  Pre-Diabetes Heidi Robinson has a diagnosis of pre-diabetes based on her elevated Hgb A1c and was informed this puts her at greater risk of developing diabetes. She is stable on metformin and denies nausea, vomiting, or hypoglycemia. She is trying to improve with diet but she is struggling.   Iron Deficiency Anemia Heidi Robinson has a diagnosis of anemia. She is stable on medications and is due for labs soon. She denies GI upset.   Depression with Emotional Eating Behaviors Heidi Robinson is still struggling with stress and emotional eating. She feels she is not getting enough benefit from the current dose of Wellbutrin. Her blood pressure is stable and she denies insomnia. Heidi Robinson is using food for comfort to the extent that it is negatively impacting her health.  She often snacks when she is not hungry. Heidi Robinson sometimes feels she is out of control and then feels guilty that she made poor food choices. She has been working on behavior modification techniques to help reduce her emotional eating and has been somewhat successful. She shows no sign of suicidal or homicidal ideations.  Depression screen [facility] 2/9 11/19/2018 08/06/2018 12/21/2017 11/06/2017 08/04/2017  Decreased Interest 0 0 3 0 0  Down, Depressed, Hopeless 0 0 3 0 0  PHQ - 2 Score 0 0 6 0 0  Altered sleeping - - 2 - -  Tired, decreased energy - - 3 - -  Change in appetite - - 2 - -  Feeling bad or failure about yourself  - - 2 - -  Trouble concentrating - - 3 - -  Moving slowly or fidgety/restless - - 2 - -  Suicidal thoughts - - 0 - -  PHQ-9 Score - - 20 - -  Difficult doing work/chores - - Somewhat difficult - -    ASSESSMENT AND PLAN:  Vitamin D deficiency - Plan: Vitamin D, Ergocalciferol, (DRISDOL) 1.25 MG (50000 UT) CAPS capsule  Prediabetes - Plan: metFORMIN (GLUCOPHAGE) 500 MG tablet  Other iron deficiency anemia - Plan: ferrous sulfate 325 (65 FE) MG tablet  Other depression - with emotional eating - Plan: buPROPion (WELLBUTRIN SR) 200 MG 12 hr tablet  Class 3 severe obesity with serious comorbidity and body mass index (BMI) of 40.0 to 44.9 in adult, unspecified obesity type ([facility])  PLAN:  Vitamin D Deficiency Heidi Robinson was informed that low vitamin D levels contributes to fatigue and are associated with obesity, breast, and colon cancer. Heidi Robinson agrees to continue taking prescription Vit D @50 ,000 IU every week #4 and we will refill for 1 month. She will follow up for routine testing of vitamin D, at least 2-3 times per year. She was informed of the risk of over-replacement of vitamin D and agrees to not increase her dose unless she discusses this with [place] first. We will recheck labs in 1 month. Heidi Robinson agrees to follow up with our clinic in 3 weeks.  Pre-Diabetes Heidi Robinson will continue to work  on weight loss, exercise, and decreasing simple carbohydrates in her diet to help decrease the risk of diabetes. We dicussed metformin including benefits and risks. She was informed that eating too many simple carbohydrates or too many calories at one sitting increases the likelihood of GI side effects. Heidi Robinson agrees to continue taking metformin 500 mg BID #60 and we will refill for 1 month. We will recheck labs in 1 month. Heidi Robinson agrees to follow up with our clinic in 3 weeks as directed to monitor her progress.  Iron Deficiency Anemia The diagnosis of Iron deficiency anemia was discussed with Heidi Robinson and was explained in detail. She was given suggestions of iron rich foods. Heidi Robinson agrees to continue taking Fesoy 325 mg qd #30 and we will refill for 1 month. We will recheck labs in 1 month. Heidi Robinson agrees to follow up with our clinic in 3 weeks.  Depression with Emotional Eating Behaviors We discussed behavior modification techniques today to help Heidi Robinson deal with her emotional eating and depression. Heidi Robinson agrees to increase Wellbutrin SR to 200 mg BID #60 and we will refill for 1 month. Heidi Robinson agrees to follow up with our clinic in 3 weeks.  Obesity Heidi Robinson is currently in the action stage of change. As such, her goal is to continue with weight loss efforts She has agreed to portion control better and make smarter food choices, such as increase vegetables and decrease simple carbohydrates  Heidi Robinson has been instructed to work up to a goal of 150 minutes of combined cardio and strengthening exercise per week for weight loss and overall health benefits. We discussed the following Behavioral Modification Strategies today: work on meal planning and easy cooking plans, emotional eating strategies, ways to avoid boredom eating, ways to avoid night time snacking, keeping healthy foods in the home, and better snacking choices   Heidi Robinson has agreed to follow up with our clinic in 3 weeks. She was informed of the importance of frequent  follow up visits to maximize her success with intensive lifestyle modifications for her multiple health conditions.  ALLERGIES: Allergies  Allergen Reactions   Penicillins Anaphylaxis   Food     Fish, Walnuts, Cashews, Almonds, Pecans, Blueberries, Pine nuts, Pistachios, Macadamia Nuts, Coconuts, Chestnuts   Latex     "Rash, hives maybe... I don't really remember"    MEDICATIONS: Current Outpatient Medications on File Prior to Visit  Medication Sig Dispense Refill   albuterol (PROVENTIL) (2.5 MG/3ML) 0.083% nebulizer solution Inhale 2.5 mg into the lungs every 6 (six) hours as needed for wheezing or shortness of breath.     buPROPion (WELLBUTRIN SR) 200 MG 12 hr tablet Take 1 tablet (200 mg total) by mouth daily. 30 tablet 0   ferrous sulfate 325 (65 FE) MG tablet Take 1 tablet (325 mg total) by mouth daily with breakfast. 30 tablet 0   lisinopril-hydrochlorothiazide (  PRINZIDE,ZESTORETIC) 10-12.5 MG tablet TAKE 1 TABLET BY MOUTH ONCE DAILY 30 tablet 11   meloxicam (MOBIC) 15 MG tablet Take 1 tablet (15 mg total) by mouth daily as needed for pain. 30 tablet 5   metFORMIN (GLUCOPHAGE) 500 MG tablet Take 1 tablet (500 mg total) by mouth 2 (two) times daily with a meal. 60 tablet 0   Multiple Vitamin (MULTIVITAMINS PO) Take by mouth.     Naltrexone-buPROPion HCl ER (CONTRAVE) 8-90 MG TB12 Take two tabs by mouth twice daily 120 tablet 0   polyethylene glycol powder (GLYCOLAX/MIRALAX) powder Take 17 g by mouth daily. 3350 g 0   traMADol (ULTRAM) 50 MG tablet Take by mouth every 6 (six) hours as needed.     Vitamin D, Ergocalciferol, (DRISDOL) 1.25 MG (50000 UT) CAPS capsule Take 1 capsule (50,000 Units total) by mouth every 7 (seven) days. 4 capsule 0   No current facility-administered medications on file prior to visit.     PAST MEDICAL HISTORY: Past Medical History:  Diagnosis Date   Allergy    Anemia    Arthritis    Asthma    Fibroids    Hypertension    Iron  deficiency anemia due to chronic blood loss    Obesity (BMI 30-39.9) 02/02/2012   Sickle cell trait ([place]) 06/04/2014   hemoglobin electrophoresis confirmed    PAST SURGICAL HISTORY: Past Surgical History:  Procedure Laterality Date   BREAST REDUCTION SURGERY Bilateral 11/03/2018   Procedure: BILATERAL BREAST REDUCTION WITH LIPOSUCTION;  Surgeon: Wallace Going, DO;  Location: [place];  Service: Plastics;  Laterality: Bilateral;   tonsillecomy     TONSILLECTOMY  1997    SOCIAL HISTORY: Social History   Tobacco Use   Smoking status: Former Smoker    Years: 2.00    Types: Cigarettes    Last attempt to quit: 07/11/2003    Years since quitting: 15.5   Smokeless tobacco: Never Used  Substance Use Topics   Alcohol use: No    Alcohol/week: 0.0 standard drinks   Drug use: No    FAMILY HISTORY: Family History  Problem Relation Age of Onset   Hypertension Mother    Anxiety disorder Mother    Obesity Mother    Kidney disease Brother    Hypertension Maternal Grandmother    Cancer Maternal Grandmother        bladder   Hyperlipidemia Father    Cancer Paternal Grandfather        prostate   Breast cancer Neg Hx     ROS: Review of Systems  Constitutional: Negative for weight loss.  Gastrointestinal: Negative for nausea and vomiting.  Musculoskeletal:       Negative muscle weakness  Endo/Heme/Allergies:       Negative hypoglycemia  Psychiatric/Behavioral: Positive for depression. Negative for suicidal ideas. The patient does not have insomnia.     PHYSICAL EXAM: Pt in no acute distress  RECENT LABS AND TESTS: BMET    Component Value Date/Time   NA 139 11/19/2018 1155   K 4.3 11/19/2018 1155   CL 100 11/19/2018 1155   CO2 22 11/19/2018 1155   GLUCOSE 108 (H) 11/19/2018 1155   GLUCOSE 83 10/31/2018 1445   BUN 8 11/19/2018 1155   CREATININE 0.77 11/19/2018 1155   CREATININE 0.80 07/24/2016 1238   CALCIUM 9.6 11/19/2018 1155    GFRNONAA 96 11/19/2018 1155   GFRAA 110 11/19/2018 1155   Lab Results  Component Value Date   HGBA1C 5.2  05/11/2018   HGBA1C 5.7 (H) 12/21/2017   HGBA1C 5.2 08/04/2017   HGBA1C 5.3 07/24/2016   HGBA1C 5.6 07/31/2015   Lab Results  Component Value Date   INSULIN 16.7 05/11/2018   INSULIN 23.4 12/21/2017   CBC    Component Value Date/Time   WBC 12.5 (H) 11/19/2018 1155   WBC 11.5 (H) 07/24/2016 1238   RBC 4.44 11/19/2018 1155   RBC 5.42 (H) 07/24/2016 1238   HGB 9.5 (L) 11/19/2018 1155   HCT 30.0 (L) 11/19/2018 1155   PLT 496 (H) 11/19/2018 1155   MCV 68 (L) 11/19/2018 1155   MCH 21.4 (L) 11/19/2018 1155   MCH 23.1 (L) 07/24/2016 1238   MCHC 31.7 11/19/2018 1155   MCHC 33.6 07/24/2016 1238   RDW 15.7 (H) 11/19/2018 1155   LYMPHSABS 2.0 05/11/2018 0745   MONOABS 0.5 06/04/2014 0828   EOSABS 0.3 05/11/2018 0745   BASOSABS 0.0 05/11/2018 0745   Iron/TIBC/Ferritin/ %Sat    Component Value Date/Time   FERRITIN 19 08/04/2017 1006   Lipid Panel     Component Value Date/Time   CHOL 173 05/11/2018 0745   TRIG 128 05/11/2018 0745   HDL 44 05/11/2018 0745   CHOLHDL 3.8 08/04/2017 1006   CHOLHDL 3.6 07/24/2016 1238   VLDL 27 07/24/2016 1238   LDLCALC 103 (H) 05/11/2018 0745   Hepatic Function Panel     Component Value Date/Time   PROT 6.5 11/19/2018 1155   ALBUMIN 4.1 11/19/2018 1155   AST 11 11/19/2018 1155   ALT 10 11/19/2018 1155   ALKPHOS 70 11/19/2018 1155   BILITOT <0.2 11/19/2018 1155      Component Value Date/Time   TSH 1.060 11/19/2018 1155   TSH 1.270 12/21/2017 0959   TSH 1.140 08/04/2017 1006      I, Trixie Dredge, am acting as Location manager for Dennard Nip, MD I have reviewed the above documentation for accuracy and completeness, and I agree with the above. -Dennard Nip, MD

## 2019-02-23 ENCOUNTER — Encounter (INDEPENDENT_AMBULATORY_CARE_PROVIDER_SITE_OTHER): Payer: Self-pay | Admitting: Family Medicine

## 2019-03-07 ENCOUNTER — Ambulatory Visit (INDEPENDENT_AMBULATORY_CARE_PROVIDER_SITE_OTHER): Payer: 59 | Admitting: Family Medicine

## 2019-03-07 ENCOUNTER — Other Ambulatory Visit: Payer: Self-pay

## 2019-03-07 DIAGNOSIS — F3289 Other specified depressive episodes: Secondary | ICD-10-CM

## 2019-03-07 DIAGNOSIS — R7303 Prediabetes: Secondary | ICD-10-CM | POA: Diagnosis not present

## 2019-03-07 DIAGNOSIS — D508 Other iron deficiency anemias: Secondary | ICD-10-CM | POA: Diagnosis not present

## 2019-03-07 DIAGNOSIS — Z6841 Body Mass Index (BMI) 40.0 and over, adult: Secondary | ICD-10-CM

## 2019-03-07 DIAGNOSIS — E559 Vitamin D deficiency, unspecified: Secondary | ICD-10-CM

## 2019-03-07 MED ORDER — FERROUS SULFATE 325 (65 FE) MG PO TABS: 325.0000 mg | ORAL_TABLET | Freq: Every day | ORAL | 0 refills | Status: DC

## 2019-03-07 MED ORDER — VITAMIN D (ERGOCALCIFEROL) 1.25 MG (50000 UNIT) PO CAPS: 50000.0000 [IU] | ORAL_CAPSULE | ORAL | 0 refills | Status: DC

## 2019-03-07 MED ORDER — METFORMIN HCL 500 MG PO TABS: 500.0000 mg | ORAL_TABLET | Freq: Two times a day (BID) | ORAL | 0 refills | Status: DC

## 2019-03-07 MED ORDER — BUPROPION HCL ER (SR) 200 MG PO TB12: 200.0000 mg | ORAL_TABLET | Freq: Two times a day (BID) | ORAL | 0 refills | Status: DC

## 2019-03-08 NOTE — Progress Notes (Signed)
Office: [phone]  /  Fax: [phone] TeleHealth Visit:  Heidi Robinson has verbally consented to this TeleHealth visit today. The patient is located at home, the provider is located at the [employer] [facility] office. The participants in this visit include the listed provider and patient. The visit was conducted today via Face Time.  HPI:   Chief Complaint: OBESITY Heidi Robinson is here to discuss her progress with her obesity treatment plan. She is on the Category 2 plan and is following her eating plan approximately 50 % of the time. She states she is walking and using weights 45 minutes 4 times per week. Heidi Robinson is doing well with her eating plan and feels that she is losing weight. She feels that she is doing well avoiding emotional eating. Her hunger is controlled and she is sleeping well. Heidi Robinson is starting to walk outside for exercise on nice days.  We were unable to weigh the patient today for this TeleHealth visit. She feels as if she has lost weight since her last visit. She has lost 35 lbs since starting treatment with [place].  Pre-Diabetes Heidi Robinson has a diagnosis of pre-diabetes based on her elevated Hgb A1c and was informed this puts her at greater risk of developing diabetes. She is doing very well on her diet prescription and metformin and noted reduced polyphagia. She continues to work on diet and exercise to decrease risk of diabetes. Heidi Robinson is overdue for labs, but unable to get them done while in isolation. She denies nausea, vomiting, or hypoglycemia.   Iron Deficiency Anemia Heidi Robinson has a diagnosis of anemia.  She feels better on her iron supplement and with her increased foods with iron diet. Heidi Robinson denies constipation of palpitations.  Vitamin D Deficiency Heidi Robinson has a diagnosis of vitamin D deficiency. She is currently on vit D, but is almost at goal. Heidi Robinson denies nausea, vomiting, or muscle weakness.  Depression with emotional eating behaviors Heidi Robinson increase her dose at her last visit from  150 mg to 200 mg and feels that she is doing much better controlling her emotional eating and using food for comfort to the extent that it is negatively impacting her health. She often snacks when she is not hungry. Heidi Robinson sometimes feels she is out of control and then feels guilty that she made poor food choices. She has been working on behavior modification techniques to help reduce her emotional eating and has been somewhat successful. She is sleeping well overall and denies palpitations.  ASSESSMENT AND PLAN:  Prediabetes - Plan: metFORMIN (GLUCOPHAGE) 500 MG tablet  Other iron deficiency anemia - Plan: ferrous sulfate 325 (65 FE) MG tablet  Vitamin D deficiency - Plan: Vitamin D, Ergocalciferol, (DRISDOL) 1.25 MG (50000 UT) CAPS capsule  Other depression - with emotional eating - Plan: buPROPion (WELLBUTRIN SR) 200 MG 12 hr tablet  Class 3 severe obesity with serious comorbidity and body mass index (BMI) of 40.0 to 44.9 in adult, unspecified obesity type ([place])  PLAN:  Pre-Diabetes Heidi Robinson will continue to work on weight loss, exercise, and decreasing simple carbohydrates in her diet to help decrease the risk of diabetes. She was informed that eating too many simple carbohydrates or too many calories at one sitting increases the likelihood of GI side effects. Heidi Robinson agreed to continue metformin 500 mg BID #60 with no refills and a prescription was written today. Alaiza will get labs hopefully in 1 month and agreed to follow up with [place] as directed to monitor her progress in  2 weeks.  Iron Deficiency Anemia The diagnosis of Iron deficiency anemia was discussed with Heidi Robinson and was explained in detail. She was given suggestions of iron rich foods and iron supplement was prescribed. Ariam agrees to continue taking ferrous sulfate 325 mg with breakfast #30 with no refills and to continue her diet prescription. She agrees to follow up at the agreed upon time.  Vitamin D Deficiency Heidi Robinson was informed that low  vitamin D levels contribute to fatigue and are associated with obesity, breast, and colon cancer. Ayan agrees to continue to take prescription Vit D @50 ,000 IU every week #4 with no refills and will follow up for routine testing of vitamin D, at least 2-3 times per year. She was informed of the risk of over-replacement of vitamin D and agrees to not increase her dose unless she discusses this with [place] first. Heidi Robinson agrees to follow up in 2 weeks as directed.  Depression with Emotional Eating Behaviors We discussed behavior modification techniques today to help Heidi Robinson deal with her emotional eating and depression. She has agreed to take Wellbutrin SR 200 mg BID and agreed to follow up as directed.  Obesity Heidi Robinson is currently in the action stage of change. As such, her goal is to continue with weight loss efforts. She has agreed to follow the Category 2 plan. Heidi Robinson has been instructed to work up to a goal of 150 minutes of combined cardio and strengthening exercise per week for weight loss and overall health benefits. We discussed the following Behavioral Modification Strategies today: work on meal planning and easy cooking plans, keeping healthy foods in the home, better snacking choices, and emotional eating strategies.  Heidi Robinson has agreed to follow up with our clinic in 2 weeks. She was informed of the importance of frequent follow up visits to maximize her success with intensive lifestyle modifications for her multiple health conditions.  ALLERGIES: Allergies  Allergen Reactions  . Penicillins Anaphylaxis  . Food     Fish, Walnuts, Cashews, Almonds, Pecans, Blueberries, [place] nuts, Pistachios, Macadamia Nuts, Coconuts, Chestnuts  . Latex     "Rash, hives maybe... I don't really remember"    MEDICATIONS: Current Outpatient Medications on File Prior to Visit  Medication Sig Dispense Refill  . albuterol (PROVENTIL) (2.5 MG/3ML) 0.083% nebulizer solution Inhale 2.5 mg into the lungs every 6 (six) hours  as needed for wheezing or shortness of breath.    . lisinopril-hydrochlorothiazide (PRINZIDE,ZESTORETIC) 10-12.5 MG tablet TAKE 1 TABLET BY MOUTH ONCE DAILY 30 tablet 11  . meloxicam (MOBIC) 15 MG tablet Take 1 tablet (15 mg total) by mouth daily as needed for pain. 30 tablet 5  . Multiple Vitamin (MULTIVITAMINS PO) Take by mouth.    . Naltrexone-buPROPion HCl ER (CONTRAVE) 8-90 MG TB12 Take two tabs by mouth twice daily 120 tablet 0  . polyethylene glycol powder (GLYCOLAX/MIRALAX) powder Take 17 g by mouth daily. 3350 g 0  . traMADol (ULTRAM) 50 MG tablet Take by mouth every 6 (six) hours as needed.     No current facility-administered medications on file prior to visit.     PAST MEDICAL HISTORY: Past Medical History:  Diagnosis Date  . Allergy   . Anemia   . Arthritis   . Asthma   . Fibroids   . Hypertension   . Iron deficiency anemia due to chronic blood loss   . Obesity (BMI 30-39.9) 02/02/2012  . Sickle cell trait ([place]) 06/04/2014   hemoglobin electrophoresis confirmed    PAST SURGICAL HISTORY:  Past Surgical History:  Procedure Laterality Date  . BREAST REDUCTION SURGERY Bilateral 11/03/2018   Procedure: BILATERAL BREAST REDUCTION WITH LIPOSUCTION;  Surgeon: Wallace Going, DO;  Location: [facility];  Service: Plastics;  Laterality: Bilateral;  . tonsillecomy    . TONSILLECTOMY  1997    SOCIAL HISTORY: Social History   Tobacco Use  . Smoking status: Former Smoker    Years: 2.00    Types: Cigarettes    Last attempt to quit: 07/11/2003    Years since quitting: 15.6  . Smokeless tobacco: Never Used  Substance Use Topics  . Alcohol use: No    Alcohol/week: 0.0 standard drinks  . Drug use: No    FAMILY HISTORY: Family History  Problem Relation Age of Onset  . Hypertension Mother   . Anxiety disorder Mother   . Obesity Mother   . Kidney disease Brother   . Hypertension Maternal Grandmother   . Cancer Maternal Grandmother        bladder  .  Hyperlipidemia Father   . Cancer Paternal Grandfather        prostate  . Breast cancer Neg Hx     ROS: Review of Systems  Cardiovascular: Negative for palpitations.  Gastrointestinal: Negative for constipation, nausea and vomiting.  Musculoskeletal:       Negative for muscle weakness.  Endo/Heme/Allergies:       Negative for hypoglycemia. Positive for polyphagia.  Psychiatric/Behavioral: Positive for depression.    PHYSICAL EXAM: Pt in no acute distress  RECENT LABS AND TESTS: BMET    Component Value Date/Time   NA 139 11/19/2018 1155   K 4.3 11/19/2018 1155   CL 100 11/19/2018 1155   CO2 22 11/19/2018 1155   GLUCOSE 108 (H) 11/19/2018 1155   GLUCOSE 83 10/31/2018 1445   BUN 8 11/19/2018 1155   CREATININE 0.77 11/19/2018 1155   CREATININE 0.80 07/24/2016 1238   CALCIUM 9.6 11/19/2018 1155   GFRNONAA 96 11/19/2018 1155   GFRAA 110 11/19/2018 1155   Lab Results  Component Value Date   HGBA1C 5.2 05/11/2018   HGBA1C 5.7 (H) 12/21/2017   HGBA1C 5.2 08/04/2017   HGBA1C 5.3 07/24/2016   HGBA1C 5.6 07/31/2015   Lab Results  Component Value Date   INSULIN 16.7 05/11/2018   INSULIN 23.4 12/21/2017   CBC    Component Value Date/Time   WBC 12.5 (H) 11/19/2018 1155   WBC 11.5 (H) 07/24/2016 1238   RBC 4.44 11/19/2018 1155   RBC 5.42 (H) 07/24/2016 1238   HGB 9.5 (L) 11/19/2018 1155   HCT 30.0 (L) 11/19/2018 1155   PLT 496 (H) 11/19/2018 1155   MCV 68 (L) 11/19/2018 1155   MCH 21.4 (L) 11/19/2018 1155   MCH 23.1 (L) 07/24/2016 1238   MCHC 31.7 11/19/2018 1155   MCHC 33.6 07/24/2016 1238   RDW 15.7 (H) 11/19/2018 1155   LYMPHSABS 2.0 05/11/2018 0745   MONOABS 0.5 06/04/2014 0828   EOSABS 0.3 05/11/2018 0745   BASOSABS 0.0 05/11/2018 0745   Iron/TIBC/Ferritin/ %Sat    Component Value Date/Time   FERRITIN 19 08/04/2017 1006   Lipid Panel     Component Value Date/Time   CHOL 173 05/11/2018 0745   TRIG 128 05/11/2018 0745   HDL 44 05/11/2018 0745    CHOLHDL 3.8 08/04/2017 1006   CHOLHDL 3.6 07/24/2016 1238   VLDL 27 07/24/2016 1238   LDLCALC 103 (H) 05/11/2018 0745   Hepatic Function Panel     Component Value  Date/Time   PROT 6.5 11/19/2018 1155   ALBUMIN 4.1 11/19/2018 1155   AST 11 11/19/2018 1155   ALT 10 11/19/2018 1155   ALKPHOS 70 11/19/2018 1155   BILITOT <0.2 11/19/2018 1155      Component Value Date/Time   TSH 1.060 11/19/2018 1155   TSH 1.270 12/21/2017 0959   TSH 1.140 08/04/2017 1006   Results for CLARISE, CHACKO (MRN [id number]) as of 03/08/2019 09:50  Ref. Range 05/11/2018 07:45  Vitamin D, 25-Hydroxy Latest Ref Range: 30.0 - 100.0 ng/mL 46.7     I, Marcille Blanco, CMA, am acting as transcriptionist for Starlyn Skeans, MD I have reviewed the above documentation for accuracy and completeness, and I agree with the above. -Dennard Nip, MD

## 2019-03-22 ENCOUNTER — Other Ambulatory Visit: Payer: Self-pay

## 2019-03-22 ENCOUNTER — Ambulatory Visit (INDEPENDENT_AMBULATORY_CARE_PROVIDER_SITE_OTHER): Payer: 59 | Admitting: Family Medicine

## 2019-03-22 ENCOUNTER — Encounter (INDEPENDENT_AMBULATORY_CARE_PROVIDER_SITE_OTHER): Payer: Self-pay | Admitting: Family Medicine

## 2019-03-22 DIAGNOSIS — Z6841 Body Mass Index (BMI) 40.0 and over, adult: Secondary | ICD-10-CM

## 2019-03-22 DIAGNOSIS — D508 Other iron deficiency anemias: Secondary | ICD-10-CM

## 2019-03-22 DIAGNOSIS — R7303 Prediabetes: Secondary | ICD-10-CM

## 2019-03-22 DIAGNOSIS — E559 Vitamin D deficiency, unspecified: Secondary | ICD-10-CM

## 2019-03-22 DIAGNOSIS — F3289 Other specified depressive episodes: Secondary | ICD-10-CM | POA: Diagnosis not present

## 2019-03-22 MED ORDER — FERROUS SULFATE 325 (65 FE) MG PO TABS: 325.0000 mg | ORAL_TABLET | Freq: Every day | ORAL | 0 refills | Status: DC

## 2019-03-22 MED ORDER — BUPROPION HCL ER (SR) 200 MG PO TB12: 200.0000 mg | ORAL_TABLET | Freq: Two times a day (BID) | ORAL | 0 refills | Status: DC

## 2019-03-22 MED ORDER — METFORMIN HCL 500 MG PO TABS: 500.0000 mg | ORAL_TABLET | Freq: Two times a day (BID) | ORAL | 0 refills | Status: DC

## 2019-03-22 MED ORDER — VITAMIN D (ERGOCALCIFEROL) 1.25 MG (50000 UNIT) PO CAPS: 50000.0000 [IU] | ORAL_CAPSULE | ORAL | 0 refills | Status: DC

## 2019-03-27 NOTE — Progress Notes (Signed)
Office: [phone]  /  Fax: [phone] TeleHealth Visit:  Heidi Robinson has verbally consented to this TeleHealth visit today. The patient is located at home, the provider is located at the [employer] [facility] office. The participants in this visit include the listed provider and patient. Heidi Robinson was unable to use Webex today and the telehealth visit was conducted via telephone.  HPI:   Chief Complaint: OBESITY Heidi Robinson is here to discuss her progress with her obesity treatment plan. She is on the Category 2 plan and is following her eating plan approximately 40 % of the time. She states she is walking, using free weights and leg machines for 45 minutes 5 times per week. Heidi Robinson is doing well with exercise this last month. She has not been following her plan closely, but thinks that she has maintained her weight.  We were unable to weigh the patient today for this TeleHealth visit. She feels as if she has maintained weight since her last visit. She has lost 35 lbs since starting treatment with [place].  Pre-Diabetes Heidi Robinson has a diagnosis of pre-diabetes based on her elevated Hgb A1c and was informed this puts her at greater risk of developing diabetes. She is overdue for labs and is stable on metformin and her diet. She continues to work on diet and exercise to decrease risk of diabetes. She denies nausea, vomiting, or hypoglycemia.   Vitamin D Deficiency Heidi Robinson has a diagnosis of vitamin D deficiency. She is currently stable on vit D and is overdue for labs. Heidi Robinson denies nausea, vomiting, or muscle weakness.  Iron Deficiency Anemia Heidi Robinson has a diagnosis of anemia. She is stable on iron supplementation. Heidi Robinson denies constipation or palpitations.  Depression with emotional eating behaviors Heidi Robinson's mood is stable overall, but she notes an increase in stress with COVID19 and work stress. She is sleeping well. She is struggling with emotional eating and using food for comfort to the extent that it is  negatively impacting her health. She often snacks when she is not hungry. Heidi Robinson sometimes feels she is out of control and then feels guilty that she made poor food choices. She has been working on behavior modification techniques to help reduce her emotional eating and has been somewhat successful. She denies palpitations.  Depression screen Heidi Robinson 2/9 11/19/2018 08/06/2018 12/21/2017 11/06/2017 08/04/2017  Decreased Interest 0 0 3 0 0  Down, Depressed, Hopeless 0 0 3 0 0  PHQ - 2 Score 0 0 6 0 0  Altered sleeping - - 2 - -  Tired, decreased energy - - 3 - -  Change in appetite - - 2 - -  Feeling bad or failure about yourself  - - 2 - -  Trouble concentrating - - 3 - -  Moving slowly or fidgety/restless - - 2 - -  Suicidal thoughts - - 0 - -  PHQ-9 Score - - 20 - -  Difficult doing work/chores - - Somewhat difficult - -   ASSESSMENT AND PLAN:  Prediabetes - Plan: Comprehensive metabolic panel, Hemoglobin A1c, Insulin, random, metFORMIN (GLUCOPHAGE) 500 MG tablet  Vitamin D deficiency - Plan: VITAMIN D 25 Hydroxy (Vit-D Deficiency, Fractures), Vitamin D, Ergocalciferol, (DRISDOL) 1.25 MG (50000 UT) CAPS capsule  Other iron deficiency anemia - Plan: CBC With Differential, ferrous sulfate 325 (65 FE) MG tablet  Other depression - with emotional eating - Plan: buPROPion (WELLBUTRIN SR) 200 MG 12 hr tablet  Class 3 severe obesity with serious comorbidity and body mass index (BMI)  of 40.0 to 44.9 in adult, unspecified obesity type [facility])  PLAN:  Pre-Diabetes Heidi Robinson will continue to work on weight loss, exercise, and decreasing simple carbohydrates in her diet to help decrease the risk of diabetes. She was informed that eating too many simple carbohydrates or too many calories at one sitting increases the likelihood of GI side effects. Heidi Robinson agreed to continue metformin 500 mg BID #60 with no refills and a prescription was written today. Heidi Robinson agreed to follow up with [place] as directed to monitor her  progress in 3 weeks.   Vitamin D Deficiency Heidi Robinson was informed that low vitamin D levels contribute to fatigue and are associated with obesity, breast, and colon cancer. Heidi Robinson agrees to continue to take prescription Vit D @50 ,000 IU every week #4 with no refills and will follow up for routine testing of vitamin D, at least 2-3 times per year. She was informed of the risk of over-replacement of vitamin D and agrees to not increase her dose unless she discusses this with [place] first. Heidi Robinson agrees to follow up in 3 weeks as directed.  Iron Deficiency Anemia The diagnosis of Iron deficiency anemia was discussed with Heidi Robinson and was explained in detail. She was given suggestions of iron rich foods and ferrous sulfate 325 mg qAM with breakfast #30 with no refills was prescribed. Heidi Robinson agrees to follow up at the agreed upon time.  Depression with Emotional Eating Behaviors We discussed behavior modification techniques today to help Heidi Robinson deal with her emotional eating and depression. She has agreed to take Wellbutrin SR 200 mg BID #60 and agreed to follow up as directed.  Obesity Heidi Robinson is currently in the action stage of change. As such, her goal is to continue with weight loss efforts. She has agreed to follow the Category 2 plan. Heidi Robinson has been instructed to work up to a goal of 150 minutes of combined cardio and strengthening exercise per week for weight loss and overall health benefits. We discussed the following Behavioral Modification Strategies today: work on meal planning and easy cooking plans, emotional eating strategies, and ways to avoid boredom eating.  Heidi Robinson has agreed to follow up with our clinic in 3 weeks. She was informed of the importance of frequent follow up visits to maximize her success with intensive lifestyle modifications for her multiple health conditions.  ALLERGIES: Allergies  Allergen Reactions  . Penicillins Anaphylaxis  . Food     Fish, Walnuts, Cashews, Almonds, Pecans,  Blueberries, [place] nuts, Pistachios, Macadamia Nuts, Coconuts, Chestnuts  . Latex     "Rash, hives maybe... I don't really remember"    MEDICATIONS: Current Outpatient Medications on File Prior to Visit  Medication Sig Dispense Refill  . albuterol (PROVENTIL) (2.5 MG/3ML) 0.083% nebulizer solution Inhale 2.5 mg into the lungs every 6 (six) hours as needed for wheezing or shortness of breath.    . lisinopril-hydrochlorothiazide (PRINZIDE,ZESTORETIC) 10-12.5 MG tablet TAKE 1 TABLET BY MOUTH ONCE DAILY 30 tablet 11  . meloxicam (MOBIC) 15 MG tablet Take 1 tablet (15 mg total) by mouth daily as needed for pain. 30 tablet 5  . Multiple Vitamin (MULTIVITAMINS PO) Take by mouth.    . Naltrexone-buPROPion HCl ER (CONTRAVE) 8-90 MG TB12 Take two tabs by mouth twice daily 120 tablet 0  . polyethylene glycol powder (GLYCOLAX/MIRALAX) powder Take 17 g by mouth daily. 3350 g 0  . traMADol (ULTRAM) 50 MG tablet Take by mouth every 6 (six) hours as needed.     No current facility-administered  medications on file prior to visit.     PAST MEDICAL HISTORY: Past Medical History:  Diagnosis Date  . Allergy   . Anemia   . Arthritis   . Asthma   . Fibroids   . Hypertension   . Iron deficiency anemia due to chronic blood loss   . Obesity (BMI 30-39.9) 02/02/2012  . Sickle cell trait ([place]) 06/04/2014   hemoglobin electrophoresis confirmed    PAST SURGICAL HISTORY: Past Surgical History:  Procedure Laterality Date  . BREAST REDUCTION SURGERY Bilateral 11/03/2018   Procedure: BILATERAL BREAST REDUCTION WITH LIPOSUCTION;  Surgeon: Wallace Going, DO;  Location: [facility];  Service: Plastics;  Laterality: Bilateral;  . tonsillecomy    . TONSILLECTOMY  1997    SOCIAL HISTORY: Social History   Tobacco Use  . Smoking status: Former Smoker    Years: 2.00    Types: Cigarettes    Last attempt to quit: 07/11/2003    Years since quitting: 15.7  . Smokeless tobacco: Never Used   Substance Use Topics  . Alcohol use: No    Alcohol/week: 0.0 standard drinks  . Drug use: No    FAMILY HISTORY: Family History  Problem Relation Age of Onset  . Hypertension Mother   . Anxiety disorder Mother   . Obesity Mother   . Kidney disease Brother   . Hypertension Maternal Grandmother   . Cancer Maternal Grandmother        bladder  . Hyperlipidemia Father   . Cancer Paternal Grandfather        prostate  . Breast cancer Neg Hx     ROS: Review of Systems  Cardiovascular: Negative for palpitations.  Gastrointestinal: Negative for constipation, nausea and vomiting.  Musculoskeletal:       Negative for muscle weakness.  Endo/Heme/Allergies:       Negative for hypoglycemia.    PHYSICAL EXAM: Pt in no acute distress  RECENT LABS AND TESTS: BMET    Component Value Date/Time   NA 139 11/19/2018 1155   K 4.3 11/19/2018 1155   CL 100 11/19/2018 1155   CO2 22 11/19/2018 1155   GLUCOSE 108 (H) 11/19/2018 1155   GLUCOSE 83 10/31/2018 1445   BUN 8 11/19/2018 1155   CREATININE 0.77 11/19/2018 1155   CREATININE 0.80 07/24/2016 1238   CALCIUM 9.6 11/19/2018 1155   GFRNONAA 96 11/19/2018 1155   GFRAA 110 11/19/2018 1155   Lab Results  Component Value Date   HGBA1C 5.2 05/11/2018   HGBA1C 5.7 (H) 12/21/2017   HGBA1C 5.2 08/04/2017   HGBA1C 5.3 07/24/2016   HGBA1C 5.6 07/31/2015   Lab Results  Component Value Date   INSULIN 16.7 05/11/2018   INSULIN 23.4 12/21/2017   CBC    Component Value Date/Time   WBC 12.5 (H) 11/19/2018 1155   WBC 11.5 (H) 07/24/2016 1238   RBC 4.44 11/19/2018 1155   RBC 5.42 (H) 07/24/2016 1238   HGB 9.5 (L) 11/19/2018 1155   HCT 30.0 (L) 11/19/2018 1155   PLT 496 (H) 11/19/2018 1155   MCV 68 (L) 11/19/2018 1155   MCH 21.4 (L) 11/19/2018 1155   MCH 23.1 (L) 07/24/2016 1238   MCHC 31.7 11/19/2018 1155   MCHC 33.6 07/24/2016 1238   RDW 15.7 (H) 11/19/2018 1155   LYMPHSABS 2.0 05/11/2018 0745   MONOABS 0.5 06/04/2014 0828    EOSABS 0.3 05/11/2018 0745   BASOSABS 0.0 05/11/2018 0745   Iron/TIBC/Ferritin/ %Sat    Component Value Date/Time  FERRITIN 19 08/04/2017 1006   Lipid Panel     Component Value Date/Time   CHOL 173 05/11/2018 0745   TRIG 128 05/11/2018 0745   HDL 44 05/11/2018 0745   CHOLHDL 3.8 08/04/2017 1006   CHOLHDL 3.6 07/24/2016 1238   VLDL 27 07/24/2016 1238   LDLCALC 103 (H) 05/11/2018 0745   Hepatic Function Panel     Component Value Date/Time   PROT 6.5 11/19/2018 1155   ALBUMIN 4.1 11/19/2018 1155   AST 11 11/19/2018 1155   ALT 10 11/19/2018 1155   ALKPHOS 70 11/19/2018 1155   BILITOT <0.2 11/19/2018 1155      Component Value Date/Time   TSH 1.060 11/19/2018 1155   TSH 1.270 12/21/2017 0959   TSH 1.140 08/04/2017 1006   Results for CAYLAH, PLOUFF (MRN [id number]) as of 03/27/2019 08:59  Ref. Range 05/11/2018 07:45  Vitamin D, 25-Hydroxy Latest Ref Range: 30.0 - 100.0 ng/mL 46.7     I, Marcille Blanco, CMA, am acting as transcriptionist for Starlyn Skeans, MD I have reviewed the above documentation for accuracy and completeness, and I agree with the above. -Dennard Nip, MD

## 2019-04-17 ENCOUNTER — Encounter (INDEPENDENT_AMBULATORY_CARE_PROVIDER_SITE_OTHER): Payer: Self-pay | Admitting: Family Medicine

## 2019-04-17 ENCOUNTER — Ambulatory Visit (INDEPENDENT_AMBULATORY_CARE_PROVIDER_SITE_OTHER): Payer: 59 | Admitting: Family Medicine

## 2019-04-17 ENCOUNTER — Other Ambulatory Visit: Payer: Self-pay

## 2019-04-17 DIAGNOSIS — E559 Vitamin D deficiency, unspecified: Secondary | ICD-10-CM | POA: Diagnosis not present

## 2019-04-17 DIAGNOSIS — D508 Other iron deficiency anemias: Secondary | ICD-10-CM

## 2019-04-17 DIAGNOSIS — F3289 Other specified depressive episodes: Secondary | ICD-10-CM

## 2019-04-17 DIAGNOSIS — R7303 Prediabetes: Secondary | ICD-10-CM | POA: Diagnosis not present

## 2019-04-17 DIAGNOSIS — Z6841 Body Mass Index (BMI) 40.0 and over, adult: Secondary | ICD-10-CM

## 2019-04-18 MED ORDER — BUPROPION HCL ER (SR) 200 MG PO TB12: 200.0000 mg | ORAL_TABLET | Freq: Two times a day (BID) | ORAL | 0 refills | Status: DC

## 2019-04-18 MED ORDER — VITAMIN D (ERGOCALCIFEROL) 1.25 MG (50000 UNIT) PO CAPS: 50000.0000 [IU] | ORAL_CAPSULE | ORAL | 0 refills | Status: DC

## 2019-04-18 MED ORDER — FERROUS SULFATE 325 (65 FE) MG PO TABS: 325.0000 mg | ORAL_TABLET | Freq: Every day | ORAL | 0 refills | Status: DC

## 2019-04-18 MED ORDER — METFORMIN HCL 500 MG PO TABS: 500.0000 mg | ORAL_TABLET | Freq: Two times a day (BID) | ORAL | 0 refills | Status: DC

## 2019-04-18 NOTE — Progress Notes (Signed)
Office: [phone]  /  Fax: [phone] TeleHealth Visit:  Heidi Robinson has verbally consented to this TeleHealth visit today. The patient is located at home, the provider is located at the [employer] [facility] office. The participants in this visit include the listed provider and patient and any and all parties involved. The visit was conducted today via telephone. Hedaya was unable to use realtime audiovisual technology today (FaceTime failed) and the telehealth visit was conducted via telephone.  HPI:   Chief Complaint: OBESITY Heidi Robinson is here to discuss her progress with her obesity treatment plan. She is on the Category 2 plan and is following her eating plan approximately 20 % of the time. She states she is walking 10 to 15 miles per week. Aide continues to maintain her weight. Heidi Robinson has increased her exercise and she is walking 10 to 15 miles per week. She is frustrated that her exercise has not caused more weight loss. We were unable to weigh the patient today for this TeleHealth visit. She feels as if she has maintained weight since her last visit.   Pre-Diabetes Cortnie has a diagnosis of prediabetes based on her elevated Hgb A1c and was informed this puts her at greater risk of developing diabetes. She has been struggling to follow a structured plan. She is still eating too much simple carbohydrates. She is on metformin and she denies andy nausea, vomiting or hypoglycemia. She continues to work on diet and exercise to decrease risk of diabetes. She denies nausea or hypoglycemia.  Vitamin D deficiency Heidi Robinson has a diagnosis of vitamin D deficiency. Heidi Robinson is stable on vit D and denies nausea, vomiting or muscle weakness. She is due for labs.  Depression with emotional eating behaviors Heidi Robinson is still struggling with organization and meal prepping, as well as mindless eating, when she is not hungry. Her mood is stable and she has no insomnia. She has been working on behavior modification  techniques to help reduce her emotional eating and has been somewhat successful. She shows no sign of suicidal or homicidal ideations.  Iron Deficiency Anemia Heidi Robinson has a diagnosis of anemia.  She is stable on FeSO4. She denies constipation or palpitations. She is due for labs.  ASSESSMENT AND PLAN:  Vitamin D deficiency - Plan: Vitamin D, Ergocalciferol, (DRISDOL) 1.25 MG (50000 UT) CAPS capsule  Prediabetes - Plan: metFORMIN (GLUCOPHAGE) 500 MG tablet  Other iron deficiency anemia - Plan: ferrous sulfate 325 (65 FE) MG tablet  Other depression - with emotional eating - Plan: buPROPion (WELLBUTRIN SR) 200 MG 12 hr tablet  Class 3 severe obesity with serious comorbidity and body mass index (BMI) of 40.0 to 44.9 in adult, unspecified obesity type ([place])  PLAN:  Pre-Diabetes Heidi Robinson will continue to work on weight loss, exercise, and decreasing simple carbohydrates in her diet to help decrease the risk of diabetes. We dicussed metformin including benefits and risks. She was informed that eating too many simple carbohydrates or too many calories at one sitting increases the likelihood of GI side effects. Heidi Robinson agreed to continue metformin 500 mg 500 mg twice daily #60 with no refills and follow up with [place] as directed to monitor her progress.  Vitamin D Deficiency Heidi Robinson was informed that low vitamin D levels contributes to fatigue and are associated with obesity, breast, and colon cancer. She agrees to continue to take prescription Vit D @50 ,000 IU every week #4 with no refills and will follow up for routine testing of vitamin D, at least  2-3 times per year. She was informed of the risk of over-replacement of vitamin D and agrees to not increase her dose unless she discusses this with [place] first. We will check labs at the next in-office visit and Prerna agrees to follow up as directed.  Depression with Emotional Eating Behaviors We discussed behavior modification techniques today to help Heidi Robinson deal with  her emotional eating and depression. She has agreed to continue Wellbutrin SR 200 mg twice daily #60 with no refills and follow up as directed.  Iron Deficiency Anemia The diagnosis of Iron deficiency anemia was discussed with Heidi Robinson and was explained in detail. She was given suggestions of iron rich foods and iron supplement was not prescribed. We will check labs as soon as patient is able to be seen back in the office.  Obesity [place] is currently in the action stage of change. As such, her goal is to continue with weight loss efforts She has agreed to keep a food journal with 1100 to 1400 calories and 80+ grams of protein daily Heidi Robinson has been instructed to work up to a goal of 150 minutes of combined cardio and strengthening exercise per week for weight loss and overall health benefits. We discussed the following Behavioral Modification Strategies today: increase H2O intake, increasing lean protein intake and decreasing simple carbohydrates   Heidi Robinson was educated again, that the current nutrition is 80 to 90 percent of weight loss and she cannot out exercise poor nutrition. Patient agreed to change to journaling.  I spent > than 50% of the 25 minute visit on counseling as documented in the note.   Heidi Robinson has agreed to follow up with our clinic in 2 to 3 weeks. She was informed of the importance of frequent follow up visits to maximize her success with intensive lifestyle modifications for her multiple health conditions.  ALLERGIES: Allergies  Allergen Reactions  . Penicillins Anaphylaxis  . Food     Fish, Walnuts, Cashews, Almonds, Pecans, Blueberries, [place] nuts, Pistachios, Macadamia Nuts, Coconuts, Chestnuts  . Latex     "Rash, hives maybe... I don't really remember"    MEDICATIONS: Current Outpatient Medications on File Prior to Visit  Medication Sig Dispense Refill  . albuterol (PROVENTIL) (2.5 MG/3ML) 0.083% nebulizer solution Inhale 2.5 mg into the lungs every 6 (six) hours as needed  for wheezing or shortness of breath.    . lisinopril-hydrochlorothiazide (PRINZIDE,ZESTORETIC) 10-12.5 MG tablet TAKE 1 TABLET BY MOUTH ONCE DAILY 30 tablet 11  . meloxicam (MOBIC) 15 MG tablet Take 1 tablet (15 mg total) by mouth daily as needed for pain. 30 tablet 5  . Multiple Vitamin (MULTIVITAMINS PO) Take by mouth.    . polyethylene glycol powder (GLYCOLAX/MIRALAX) powder Take 17 g by mouth daily. 3350 g 0  . traMADol (ULTRAM) 50 MG tablet Take by mouth every 6 (six) hours as needed.     No current facility-administered medications on file prior to visit.     PAST MEDICAL HISTORY: Past Medical History:  Diagnosis Date  . Allergy   . Anemia   . Arthritis   . Asthma   . Fibroids   . Hypertension   . Iron deficiency anemia due to chronic blood loss   . Obesity (BMI 30-39.9) 02/02/2012  . Sickle cell trait ([place]) 06/04/2014   hemoglobin electrophoresis confirmed    PAST SURGICAL HISTORY: Past Surgical History:  Procedure Laterality Date  . BREAST REDUCTION SURGERY Bilateral 11/03/2018   Procedure: BILATERAL BREAST REDUCTION WITH LIPOSUCTION;  Surgeon: Marla Roe,  Loel Lofty, DO;  Location: [facility];  Service: Plastics;  Laterality: Bilateral;  . tonsillecomy    . TONSILLECTOMY  1997    SOCIAL HISTORY: Social History   Tobacco Use  . Smoking status: Former Smoker    Years: 2.00    Types: Cigarettes    Last attempt to quit: 07/11/2003    Years since quitting: 15.7  . Smokeless tobacco: Never Used  Substance Use Topics  . Alcohol use: No    Alcohol/week: 0.0 standard drinks  . Drug use: No    FAMILY HISTORY: Family History  Problem Relation Age of Onset  . Hypertension Mother   . Anxiety disorder Mother   . Obesity Mother   . Kidney disease Brother   . Hypertension Maternal Grandmother   . Cancer Maternal Grandmother        bladder  . Hyperlipidemia Father   . Cancer Paternal Grandfather        prostate  . Breast cancer Neg Hx     ROS:  Review of Systems  Constitutional: Negative for weight loss.  Cardiovascular: Negative for palpitations.  Gastrointestinal: Negative for constipation, nausea and vomiting.  Musculoskeletal:       Negative for muscle weakness  Endo/Heme/Allergies:       Negative for hypoglycemia  Psychiatric/Behavioral: Positive for depression. Negative for suicidal ideas. The patient does not have insomnia.     PHYSICAL EXAM: Pt in no acute distress  RECENT LABS AND TESTS: BMET    Component Value Date/Time   NA 139 11/19/2018 1155   K 4.3 11/19/2018 1155   CL 100 11/19/2018 1155   CO2 22 11/19/2018 1155   GLUCOSE 108 (H) 11/19/2018 1155   GLUCOSE 83 10/31/2018 1445   BUN 8 11/19/2018 1155   CREATININE 0.77 11/19/2018 1155   CREATININE 0.80 07/24/2016 1238   CALCIUM 9.6 11/19/2018 1155   GFRNONAA 96 11/19/2018 1155   GFRAA 110 11/19/2018 1155   Lab Results  Component Value Date   HGBA1C 5.2 05/11/2018   HGBA1C 5.7 (H) 12/21/2017   HGBA1C 5.2 08/04/2017   HGBA1C 5.3 07/24/2016   HGBA1C 5.6 07/31/2015   Lab Results  Component Value Date   INSULIN 16.7 05/11/2018   INSULIN 23.4 12/21/2017   CBC    Component Value Date/Time   WBC 12.5 (H) 11/19/2018 1155   WBC 11.5 (H) 07/24/2016 1238   RBC 4.44 11/19/2018 1155   RBC 5.42 (H) 07/24/2016 1238   HGB 9.5 (L) 11/19/2018 1155   HCT 30.0 (L) 11/19/2018 1155   PLT 496 (H) 11/19/2018 1155   MCV 68 (L) 11/19/2018 1155   MCH 21.4 (L) 11/19/2018 1155   MCH 23.1 (L) 07/24/2016 1238   MCHC 31.7 11/19/2018 1155   MCHC 33.6 07/24/2016 1238   RDW 15.7 (H) 11/19/2018 1155   LYMPHSABS 2.0 05/11/2018 0745   MONOABS 0.5 06/04/2014 0828   EOSABS 0.3 05/11/2018 0745   BASOSABS 0.0 05/11/2018 0745   Iron/TIBC/Ferritin/ %Sat    Component Value Date/Time   FERRITIN 19 08/04/2017 1006   Lipid Panel     Component Value Date/Time   CHOL 173 05/11/2018 0745   TRIG 128 05/11/2018 0745   HDL 44 05/11/2018 0745   CHOLHDL 3.8 08/04/2017 1006    CHOLHDL 3.6 07/24/2016 1238   VLDL 27 07/24/2016 1238   LDLCALC 103 (H) 05/11/2018 0745   Hepatic Function Panel     Component Value Date/Time   PROT 6.5 11/19/2018 1155   ALBUMIN 4.1  11/19/2018 1155   AST 11 11/19/2018 1155   ALT 10 11/19/2018 1155   ALKPHOS 70 11/19/2018 1155   BILITOT <0.2 11/19/2018 1155      Component Value Date/Time   TSH 1.060 11/19/2018 1155   TSH 1.270 12/21/2017 0959   TSH 1.140 08/04/2017 1006     Ref. Range 05/11/2018 07:45  Vitamin D, 25-Hydroxy Latest Ref Range: 30.0 - 100.0 ng/mL 46.7    I, Doreene Nest, am acting as Location manager for Dennard Nip, MD I have reviewed the above documentation for accuracy and completeness, and I agree with the above. -Dennard Nip, MD

## 2019-04-25 DIAGNOSIS — F32A Depression, unspecified: Secondary | ICD-10-CM | POA: Insufficient documentation

## 2019-04-25 DIAGNOSIS — Z6841 Body Mass Index (BMI) 40.0 and over, adult: Secondary | ICD-10-CM | POA: Insufficient documentation

## 2019-04-25 DIAGNOSIS — F329 Major depressive disorder, single episode, unspecified: Secondary | ICD-10-CM | POA: Insufficient documentation

## 2019-05-08 ENCOUNTER — Ambulatory Visit (INDEPENDENT_AMBULATORY_CARE_PROVIDER_SITE_OTHER): Payer: 59 | Admitting: Family Medicine

## 2019-05-08 ENCOUNTER — Encounter (INDEPENDENT_AMBULATORY_CARE_PROVIDER_SITE_OTHER): Payer: Self-pay | Admitting: Family Medicine

## 2019-05-08 ENCOUNTER — Other Ambulatory Visit: Payer: Self-pay

## 2019-05-08 DIAGNOSIS — R7303 Prediabetes: Secondary | ICD-10-CM | POA: Diagnosis not present

## 2019-05-08 DIAGNOSIS — E559 Vitamin D deficiency, unspecified: Secondary | ICD-10-CM

## 2019-05-08 DIAGNOSIS — K5909 Other constipation: Secondary | ICD-10-CM | POA: Diagnosis not present

## 2019-05-08 DIAGNOSIS — F3289 Other specified depressive episodes: Secondary | ICD-10-CM | POA: Diagnosis not present

## 2019-05-08 DIAGNOSIS — D508 Other iron deficiency anemias: Secondary | ICD-10-CM

## 2019-05-08 DIAGNOSIS — Z6841 Body Mass Index (BMI) 40.0 and over, adult: Secondary | ICD-10-CM

## 2019-05-08 MED ORDER — BUPROPION HCL ER (SR) 200 MG PO TB12: 200.0000 mg | ORAL_TABLET | Freq: Two times a day (BID) | ORAL | 0 refills | Status: DC

## 2019-05-08 MED ORDER — METFORMIN HCL 500 MG PO TABS: 500.0000 mg | ORAL_TABLET | Freq: Two times a day (BID) | ORAL | 0 refills | Status: DC

## 2019-05-08 MED ORDER — POLYETHYLENE GLYCOL 3350 17 GM/SCOOP PO POWD: 17.0000 g | Freq: Every day | ORAL | 0 refills | Status: DC

## 2019-05-08 MED ORDER — FERROUS SULFATE 325 (65 FE) MG PO TABS: 325.0000 mg | ORAL_TABLET | Freq: Every day | ORAL | 0 refills | Status: DC

## 2019-05-08 MED ORDER — VITAMIN D (ERGOCALCIFEROL) 1.25 MG (50000 UNIT) PO CAPS: 50000.0000 [IU] | ORAL_CAPSULE | ORAL | 0 refills | Status: DC

## 2019-05-09 NOTE — Progress Notes (Signed)
Office: [phone]  /  Fax: [phone] TeleHealth Visit:  Heidi Robinson has verbally consented to this TeleHealth visit today. The patient is located at home, the provider is located at the [employer] [facility] office. The participants in this visit include the listed provider and patient. [name] was unable to use realtime audiovisual technology today and the telehealth visit was conducted via telephone.   HPI:   Chief Complaint: OBESITY Heidi Robinson is here to discuss her progress with her obesity treatment plan. She is on the keep a food journal with 1100-1400 calories and 80+ grams of protein daily and is following her eating plan approximately 20 % of the time. She states she is walking for 45-50 minutes 4 times per week. [name] hasn't been concentrating on weight loss or diet due to increased work stress. She feels she has done well maintaining her weight during this time.  We were unable to weigh the patient today for this TeleHealth visit. She feels as if she has maintained her weight since her last visit. She has lost 35 lbs since starting treatment with [place].  Pre-Diabetes Heidi Robinson has a diagnosis of pre-diabetes based on her elevated Hgb A1c and was informed this puts her at greater risk of developing diabetes. She is stable on metformin. She is not concentrating on her diet much, but she has started walking for exercise. Last A1c was 5.2, well controlled. She denies nausea or hypoglycemia.  Vitamin D Deficiency Heidi Robinson has a diagnosis of vitamin D deficiency. She is taking prescription Vit D. Last Vit D level was almost at goal. She is over due for labs due to COVID-19 isolation. She denies nausea, vomiting or muscle weakness.  Iron Deficiency Anemia Heidi Robinson has a diagnosis of anemia. She is stable on Fesoy and controlled on miralax. She denies lightheadedness or palpitations.  Constipation Heidi Robinson's constipation is medication induced. She notes constipation is controlled on miralax. She is  requesting a refill. She denies hematochezia or melena. She denies drinking less H20 recently.  Depression with Emotional Eating Behaviors Heidi Robinson's mood is stable and she denies worsening insomnia. She is still struggling with job stress, but feels she is handling this as best as she can. Heidi Robinson struggles with emotional eating and using food for comfort to the extent that it is negatively impacting her health. She often snacks when she is not hungry. Heidi Robinson sometimes feels she is out of control and then feels guilty that she made poor food choices. She has been working on behavior modification techniques to help reduce her emotional eating and has been somewhat successful. She shows no sign of suicidal or homicidal ideations.  Depression screen [facility] 2/9 11/19/2018 08/06/2018 12/21/2017 11/06/2017 08/04/2017  Decreased Interest 0 0 3 0 0  Down, Depressed, Hopeless 0 0 3 0 0  PHQ - 2 Score 0 0 6 0 0  Altered sleeping - - 2 - -  Tired, decreased energy - - 3 - -  Change in appetite - - 2 - -  Feeling bad or failure about yourself  - - 2 - -  Trouble concentrating - - 3 - -  Moving slowly or fidgety/restless - - 2 - -  Suicidal thoughts - - 0 - -  PHQ-9 Score - - 20 - -  Difficult doing work/chores - - Somewhat difficult - -    ASSESSMENT AND PLAN:  Prediabetes - Plan: metFORMIN (GLUCOPHAGE) 500 MG tablet  Other depression - with emotional eating - Plan: buPROPion (WELLBUTRIN SR) 200  MG 12 hr tablet  Other iron deficiency anemia - Plan: ferrous sulfate 325 (65 FE) MG tablet  Other constipation - Plan: polyethylene glycol powder (GLYCOLAX/MIRALAX) 17 GM/SCOOP powder  Vitamin D deficiency - Plan: Vitamin D, Ergocalciferol, (DRISDOL) 1.25 MG (50000 UT) CAPS capsule  Class 3 severe obesity with serious comorbidity and body mass index (BMI) of 40.0 to 44.9 in adult, unspecified obesity type ([place])  PLAN:  Pre-Diabetes Heidi Robinson will continue to work on weight loss, exercise, and decreasing simple  carbohydrates in her diet to help decrease the risk of diabetes. We dicussed metformin including benefits and risks. She was informed that eating too many simple carbohydrates or too many calories at one sitting increases the likelihood of GI side effects. Heidi Robinson agrees to continue taking metformin 500 mg BID #60 and we will refill for 1 month. Heidi Robinson agrees to follow up with our clinic in 3 weeks as directed to monitor her progress.  Vitamin D Deficiency Heidi Robinson was informed that low vitamin D levels contributes to fatigue and are associated with obesity, breast, and colon cancer. Heidi Robinson agrees to continue taking prescription Vit D 50,000 IU every week #4 and we will refill for 1 month. She will follow up for routine testing of vitamin D, at least 2-3 times per year. She was informed of the risk of over-replacement of vitamin D and agrees to not increase her dose unless she discusses this with [place] first. We will recheck labs next month. [name] agrees to follow up with our clinic in 3 weeks.  Iron Deficiency Anemia The diagnosis of Iron deficiency anemia was discussed with Heidi Robinson and was explained in detail. She was given suggestions of iron rich foods and Heidi Robinson agrees to continue taking Fesoy 325 mg q AM #30 and we will refill for 1 month. We will recheck labs next month. Heidi Robinson agrees to follow up with our clinic in 3 weeks.  Constipation Heidi Robinson was informed decrease bowel movement frequency is normal while losing weight, but stools should not be hard or painful. She was advised to increase her H20 intake and work on increasing her fiber intake. High fiber foods were discussed today. Heidi Robinson agrees to continue taking miralax 17g 1 capful daily and we will refill for 1 month. Heidi Robinson agrees to follow up with our clinic in 3 weeks.  Depression with Emotional Eating Behaviors We discussed behavior modification techniques today to help Heidi Robinson deal with her emotional eating and depression. Heidi Robinson agrees to continue taking Wellbutrin SR  200 mg BID #60 and we will refill for 1 month. Heidi Robinson agrees to follow up with our clinic in 3 weeks.  I spent > than 50% of the 25 minute visit on counseling as documented in the note.  Obesity [name] is currently in the action stage of change. As such, her goal is to maintain weight for now She has agreed to follow the Category 2 plan Carmelle has been instructed to work up to a goal of 150 minutes of combined cardio and strengthening exercise per week for weight loss and overall health benefits. We discussed the following Behavioral Modification Strategies today: work on meal planning and easy cooking plans, emotional eating strategies, avoiding temptations, and planning for success Laelynn's goal is to maintain her weight until she is able to dedicate more time to weight loss.  Elsie has agreed to follow up with our clinic in 3 weeks. She was informed of the importance of frequent follow up visits to maximize her success with intensive lifestyle modifications  for her multiple health conditions.  ALLERGIES: Allergies  Allergen Reactions  . Penicillins Anaphylaxis  . Food     Fish, Walnuts, Cashews, Almonds, Pecans, Blueberries, [place] nuts, Pistachios, Macadamia Nuts, Coconuts, Chestnuts  . Latex     "Rash, hives maybe... I don't really remember"    MEDICATIONS: Current Outpatient Medications on File Prior to Visit  Medication Sig Dispense Refill  . albuterol (PROVENTIL) (2.5 MG/3ML) 0.083% nebulizer solution Inhale 2.5 mg into the lungs every 6 (six) hours as needed for wheezing or shortness of breath.    . lisinopril-hydrochlorothiazide (PRINZIDE,ZESTORETIC) 10-12.5 MG tablet TAKE 1 TABLET BY MOUTH ONCE DAILY 30 tablet 11  . meloxicam (MOBIC) 15 MG tablet Take 1 tablet (15 mg total) by mouth daily as needed for pain. 30 tablet 5  . Multiple Vitamin (MULTIVITAMINS PO) Take by mouth.    . traMADol (ULTRAM) 50 MG tablet Take by mouth every 6 (six) hours as needed.     No current  facility-administered medications on file prior to visit.     PAST MEDICAL HISTORY: Past Medical History:  Diagnosis Date  . Allergy   . Anemia   . Arthritis   . Asthma   . Fibroids   . Hypertension   . Iron deficiency anemia due to chronic blood loss   . Obesity (BMI 30-39.9) 02/02/2012  . Sickle cell trait ([place]) 06/04/2014   hemoglobin electrophoresis confirmed    PAST SURGICAL HISTORY: Past Surgical History:  Procedure Laterality Date  . BREAST REDUCTION SURGERY Bilateral 11/03/2018   Procedure: BILATERAL BREAST REDUCTION WITH LIPOSUCTION;  Surgeon: Wallace Going, DO;  Location: [facility];  Service: Plastics;  Laterality: Bilateral;  . tonsillecomy    . TONSILLECTOMY  1997    SOCIAL HISTORY: Social History   Tobacco Use  . Smoking status: Former Smoker    Years: 2.00    Types: Cigarettes    Quit date: 07/11/2003    Years since quitting: 15.8  . Smokeless tobacco: Never Used  Substance Use Topics  . Alcohol use: No    Alcohol/week: 0.0 standard drinks  . Drug use: No    FAMILY HISTORY: Family History  Problem Relation Age of Onset  . Hypertension Mother   . Anxiety disorder Mother   . Obesity Mother   . Kidney disease Brother   . Hypertension Maternal Grandmother   . Cancer Maternal Grandmother        bladder  . Hyperlipidemia Father   . Cancer Paternal Grandfather        prostate  . Breast cancer Neg Hx     ROS: Review of Systems  Constitutional: Negative for weight loss.  Cardiovascular: Negative for palpitations.  Gastrointestinal: Positive for constipation. Negative for melena, nausea and vomiting.       Negative hematochezia  Musculoskeletal:       Negative muscle weakness  Neurological:       Negative lightheadedness  Endo/Heme/Allergies:       Negative hypoglycemia  Psychiatric/Behavioral: Positive for depression. Negative for suicidal ideas. The patient does not have insomnia.     PHYSICAL EXAM: Pt in no acute  distress  RECENT LABS AND TESTS: BMET    Component Value Date/Time   NA 139 11/19/2018 1155   K 4.3 11/19/2018 1155   CL 100 11/19/2018 1155   CO2 22 11/19/2018 1155   GLUCOSE 108 (H) 11/19/2018 1155   GLUCOSE 83 10/31/2018 1445   BUN 8 11/19/2018 1155   CREATININE 0.77 11/19/2018 1155  CREATININE 0.80 07/24/2016 1238   CALCIUM 9.6 11/19/2018 1155   GFRNONAA 96 11/19/2018 1155   GFRAA 110 11/19/2018 1155   Lab Results  Component Value Date   HGBA1C 5.2 05/11/2018   HGBA1C 5.7 (H) 12/21/2017   HGBA1C 5.2 08/04/2017   HGBA1C 5.3 07/24/2016   HGBA1C 5.6 07/31/2015   Lab Results  Component Value Date   INSULIN 16.7 05/11/2018   INSULIN 23.4 12/21/2017   CBC    Component Value Date/Time   WBC 12.5 (H) 11/19/2018 1155   WBC 11.5 (H) 07/24/2016 1238   RBC 4.44 11/19/2018 1155   RBC 5.42 (H) 07/24/2016 1238   HGB 9.5 (L) 11/19/2018 1155   HCT 30.0 (L) 11/19/2018 1155   PLT 496 (H) 11/19/2018 1155   MCV 68 (L) 11/19/2018 1155   MCH 21.4 (L) 11/19/2018 1155   MCH 23.1 (L) 07/24/2016 1238   MCHC 31.7 11/19/2018 1155   MCHC 33.6 07/24/2016 1238   RDW 15.7 (H) 11/19/2018 1155   LYMPHSABS 2.0 05/11/2018 0745   MONOABS 0.5 06/04/2014 0828   EOSABS 0.3 05/11/2018 0745   BASOSABS 0.0 05/11/2018 0745   Iron/TIBC/Ferritin/ %Sat    Component Value Date/Time   FERRITIN 19 08/04/2017 1006   Lipid Panel     Component Value Date/Time   CHOL 173 05/11/2018 0745   TRIG 128 05/11/2018 0745   HDL 44 05/11/2018 0745   CHOLHDL 3.8 08/04/2017 1006   CHOLHDL 3.6 07/24/2016 1238   VLDL 27 07/24/2016 1238   LDLCALC 103 (H) 05/11/2018 0745   Hepatic Function Panel     Component Value Date/Time   PROT 6.5 11/19/2018 1155   ALBUMIN 4.1 11/19/2018 1155   AST 11 11/19/2018 1155   ALT 10 11/19/2018 1155   ALKPHOS 70 11/19/2018 1155   BILITOT <0.2 11/19/2018 1155      Component Value Date/Time   TSH 1.060 11/19/2018 1155   TSH 1.270 12/21/2017 0959   TSH 1.140 08/04/2017  1006      I, Trixie Dredge, am acting as Location manager for Dennard Nip, MD I have reviewed the above documentation for accuracy and completeness, and I agree with the above. -Dennard Nip, MD

## 2019-05-29 ENCOUNTER — Ambulatory Visit (INDEPENDENT_AMBULATORY_CARE_PROVIDER_SITE_OTHER): Payer: 59 | Admitting: Family Medicine

## 2019-05-29 ENCOUNTER — Encounter (INDEPENDENT_AMBULATORY_CARE_PROVIDER_SITE_OTHER): Payer: Self-pay

## 2019-05-29 ENCOUNTER — Encounter (INDEPENDENT_AMBULATORY_CARE_PROVIDER_SITE_OTHER): Payer: Self-pay | Admitting: Family Medicine

## 2019-05-29 ENCOUNTER — Other Ambulatory Visit: Payer: Self-pay

## 2019-05-29 VITALS — BP 113/72 | HR 104 | Temp 97.9°F | Ht 62.0 in | Wt 227.0 lb

## 2019-05-29 DIAGNOSIS — R7303 Prediabetes: Secondary | ICD-10-CM

## 2019-05-29 DIAGNOSIS — F3289 Other specified depressive episodes: Secondary | ICD-10-CM | POA: Diagnosis not present

## 2019-05-29 DIAGNOSIS — Z9189 Other specified personal risk factors, not elsewhere classified: Secondary | ICD-10-CM | POA: Diagnosis not present

## 2019-05-29 DIAGNOSIS — Z6841 Body Mass Index (BMI) 40.0 and over, adult: Secondary | ICD-10-CM

## 2019-05-29 DIAGNOSIS — D508 Other iron deficiency anemias: Secondary | ICD-10-CM | POA: Diagnosis not present

## 2019-05-29 DIAGNOSIS — E559 Vitamin D deficiency, unspecified: Secondary | ICD-10-CM | POA: Diagnosis not present

## 2019-05-29 MED ORDER — VITAMIN D (ERGOCALCIFEROL) 1.25 MG (50000 UNIT) PO CAPS: 50000.0000 [IU] | ORAL_CAPSULE | ORAL | 0 refills | Status: DC

## 2019-05-29 MED ORDER — BUPROPION HCL ER (SR) 200 MG PO TB12: 200.0000 mg | ORAL_TABLET | Freq: Two times a day (BID) | ORAL | 0 refills | Status: DC

## 2019-05-29 MED ORDER — METFORMIN HCL 500 MG PO TABS: 500.0000 mg | ORAL_TABLET | Freq: Two times a day (BID) | ORAL | 0 refills | Status: DC

## 2019-05-29 MED ORDER — FERROUS SULFATE 325 (65 FE) MG PO TABS: 325.0000 mg | ORAL_TABLET | Freq: Every day | ORAL | 0 refills | Status: DC

## 2019-05-31 NOTE — Progress Notes (Signed)
Office: [phone]  /  Fax: [phone]   HPI:   Chief Complaint: OBESITY Heidi Robinson is here to discuss her progress with her obesity treatment plan. She is on the Category 2 plan and is following her eating plan approximately 20% of the time. She states she is walking 30 minutes 2 times per week. [name] started a new job today and has been concentrating on the transition and not so much on her weight. She has increased eating out and decreased meal planning. She is unhappy she has gained weight but thinks it will be easier to stay on track after she settles in to her new routine.  Her weight is 227 lb (103 kg) today and has had a weight gain of 4 lbs since her last in-office visit 4 months ago. She has lost 31 lbs since starting treatment with [place].  Vitamin D deficiency Heidi Robinson has a diagnosis of Vitamin D deficiency. She is currently stable on prescription Vit D and denies nausea, vomiting or muscle weakness. She is due for labs.  Pre-Diabetes Heidi Robinson has a diagnosis of prediabetes based on her elevated Hgb A1c and was informed this puts her at greater risk of developing diabetes. She is stable on metformin currently but is not doing well on her diet prescription. She denies nausea, vomiting, or hypoglycemia.  At risk for cardiovascular disease [name] is at a higher than average risk for cardiovascular disease due to obesity. She currently denies any chest pain.  Iron Deficiency Anemia Heidi Robinson is stable on iron and denies palpitations or lightheadedness. She denies syncopal episodes.  Depression with emotional eating behaviors Heidi Robinson is struggling with emotional eating and using food for comfort to the extent that it is negatively impacting her health. She often snacks when she is not hungry. Heidi Robinson sometimes feels she is out of control and then feels guilty that she made poor food choices. She has been working on behavior modification techniques to help reduce her emotional eating and has been somewhat  successful. Heidi Robinson's mood is stable but she has increased stress eating with changing jobs with less motivation to concentrate on her health. She denies insomnia. She shows no sign of suicidal or homicidal ideations.  Depression screen [facility] 2/9 11/19/2018 08/06/2018 12/21/2017 11/06/2017 08/04/2017  Decreased Interest 0 0 3 0 0  Down, Depressed, Hopeless 0 0 3 0 0  PHQ - 2 Score 0 0 6 0 0  Altered sleeping - - 2 - -  Tired, decreased energy - - 3 - -  Change in appetite - - 2 - -  Feeling bad or failure about yourself  - - 2 - -  Trouble concentrating - - 3 - -  Moving slowly or fidgety/restless - - 2 - -  Suicidal thoughts - - 0 - -  PHQ-9 Score - - 20 - -  Difficult doing work/chores - - Somewhat difficult - -   ASSESSMENT AND PLAN:  Vitamin D deficiency - Plan: Vitamin D, Ergocalciferol, (DRISDOL) 1.25 MG (50000 UT) CAPS capsule, VITAMIN D 25 Hydroxy (Vit-D Deficiency, Fractures)  Prediabetes - Plan: metFORMIN (GLUCOPHAGE) 500 MG tablet, Comprehensive metabolic panel, Hemoglobin A1c, Insulin, random, Lipid Panel With LDL/HDL Ratio  Other iron deficiency anemia - Plan: ferrous sulfate 325 (65 FE) MG tablet, Anemia panel, CBC With Differential  Other depression - with emotional eating - Plan: buPROPion (WELLBUTRIN SR) 200 MG 12 hr tablet  At risk for heart disease  Class 3 severe obesity with serious comorbidity and body mass index (BMI)  of 40.0 to 44.9 in adult, unspecified obesity type ([place])  PLAN:  Vitamin D Deficiency Heidi Robinson was informed that low Vitamin D levels contributes to fatigue and are associated with obesity, breast, and colon cancer. She agrees to continue to take prescription Vit D @ 50,000 IU every week #4 with 0 refills and will have routine testing of Vitamin D. She was informed of the risk of over-replacement of Vitamin D and agrees to not increase her dose unless she discusses this with [place] first. Heidi Robinson agrees to follow-up with our clinic in 2-3 weeks.  Pre-Diabetes Heidi Robinson  will continue to work on weight loss, exercise, and decreasing simple carbohydrates in her diet to help decrease the risk of diabetes. We dicussed metformin including benefits and risks. She was informed that eating too many simple carbohydrates or too many calories at one sitting increases the likelihood of GI side effects. Heidi Robinson was given a refill on her metformin 500 mg #60 with 0 refills and agrees to follow-up with our clinic in 2-3 weeks. She is due to have lab work done.  Cardiovascular risk counseling Heidi Robinson was given extended (15 minutes) coronary artery disease prevention counseling today. She is 43 y.o. female and has risk factors for heart disease including obesity. We discussed intensive lifestyle modifications today with an emphasis on specific weight loss instructions and strategies. Pt was also informed of the importance of increasing exercise and decreasing saturated fats to help prevent heart disease.  Iron Deficiency Anemia Heidi Robinson was given a refill on her Feosol 325 mg #30 with 0 refills and will follow-up with our clinic in 2-3 weeks. She is due to have lab work done.  Depression with Emotional Eating Behaviors We discussed behavior modification techniques today to help Heidi Robinson deal with her emotional eating and depression. She was given a refill on her Wellbutrin 200 mg #60 with 0 refills and agrees to follow-up with our clinic in 2-3 weeks.  Obesity Heidi Robinson is currently in the action stage of change. As such, her goal is to maintain her weight while getting in her new routine with her new job. She has agreed to follow the Category 2 plan. Heidi Robinson has been instructed to work up to a goal of 150 minutes of combined cardio and strengthening exercise per week for weight loss and overall health benefits. We discussed the following Behavioral Modification Strategies today: increasing lean protein intake, decrease eating out, work on meal planning and easy cooking plans.  Heidi Robinson has agreed to  follow-up with our clinic in 2-3 weeks. She was informed of the importance of frequent follow-up visits to maximize her success with intensive lifestyle modifications for her multiple health conditions.  ALLERGIES: Allergies  Allergen Reactions   Penicillins Anaphylaxis   Food     Fish, Walnuts, Cashews, Almonds, Pecans, Blueberries, Pine nuts, Pistachios, Macadamia Nuts, Coconuts, Chestnuts   Latex     "Rash, hives maybe... I don't really remember"    MEDICATIONS: Current Outpatient Medications on File Prior to Visit  Medication Sig Dispense Refill   albuterol (PROVENTIL) (2.5 MG/3ML) 0.083% nebulizer solution Inhale 2.5 mg into the lungs every 6 (six) hours as needed for wheezing or shortness of breath.     lisinopril-hydrochlorothiazide (PRINZIDE,ZESTORETIC) 10-12.5 MG tablet TAKE 1 TABLET BY MOUTH ONCE DAILY 30 tablet 11   meloxicam (MOBIC) 15 MG tablet Take 1 tablet (15 mg total) by mouth daily as needed for pain. 30 tablet 5   Multiple Vitamin (MULTIVITAMINS PO) Take by mouth.  polyethylene glycol powder (GLYCOLAX/MIRALAX) 17 GM/SCOOP powder Take 17 g by mouth daily. 3350 g 0   No current facility-administered medications on file prior to visit.     PAST MEDICAL HISTORY: Past Medical History:  Diagnosis Date   Allergy    Anemia    Arthritis    Asthma    Fibroids    Hypertension    Iron deficiency anemia due to chronic blood loss    Obesity (BMI 30-39.9) 02/02/2012   Sickle cell trait ([place]) 06/04/2014   hemoglobin electrophoresis confirmed    PAST SURGICAL HISTORY: Past Surgical History:  Procedure Laterality Date   BREAST REDUCTION SURGERY Bilateral 11/03/2018   Procedure: BILATERAL BREAST REDUCTION WITH LIPOSUCTION;  Surgeon: Wallace Going, DO;  Location: [place];  Service: Plastics;  Laterality: Bilateral;   tonsillecomy     TONSILLECTOMY  1997    SOCIAL HISTORY: Social History   Tobacco Use   Smoking status:  Former Smoker    Years: 2.00    Types: Cigarettes    Quit date: 07/11/2003    Years since quitting: 15.8   Smokeless tobacco: Never Used  Substance Use Topics   Alcohol use: No    Alcohol/week: 0.0 standard drinks   Drug use: No    FAMILY HISTORY: Family History  Problem Relation Age of Onset   Hypertension Mother    Anxiety disorder Mother    Obesity Mother    Kidney disease Brother    Hypertension Maternal Grandmother    Cancer Maternal Grandmother        bladder   Hyperlipidemia Father    Cancer Paternal Grandfather        prostate   Breast cancer Neg Hx    ROS: Review of Systems  Cardiovascular: Negative for palpitations.  Gastrointestinal: Negative for nausea and vomiting.  Musculoskeletal:       Negative for muscle weakness.  Neurological:       Negative for lightheadedness. Negative for syncopal episodes.  Endo/Heme/Allergies:       Negative for hypoglycemia.  Psychiatric/Behavioral: The patient does not have insomnia.    PHYSICAL EXAM: Blood pressure 113/72, pulse (!) 104, temperature 97.9 F (36.6 C), temperature source Oral, height 5\' 2"  (1.575 m), weight 227 lb (103 kg), SpO2 99 %. Body mass index is 41.52 kg/m. Physical Exam Vitals signs reviewed.  Constitutional:      Appearance: Normal appearance. She is obese.  Cardiovascular:     Rate and Rhythm: Normal rate.     Pulses: Normal pulses.  Pulmonary:     Effort: Pulmonary effort is normal.     Breath sounds: Normal breath sounds.  Musculoskeletal: Normal range of motion.  Skin:    General: Skin is warm and dry.  Neurological:     Mental Status: She is alert and oriented to person, place, and time.  Psychiatric:        Behavior: Behavior normal.   RECENT LABS AND TESTS: BMET    Component Value Date/Time   NA 139 11/19/2018 1155   K 4.3 11/19/2018 1155   CL 100 11/19/2018 1155   CO2 22 11/19/2018 1155   GLUCOSE 108 (H) 11/19/2018 1155   GLUCOSE 83 10/31/2018 1445   BUN 8  11/19/2018 1155   CREATININE 0.77 11/19/2018 1155   CREATININE 0.80 07/24/2016 1238   CALCIUM 9.6 11/19/2018 1155   GFRNONAA 96 11/19/2018 1155   GFRAA 110 11/19/2018 1155   Lab Results  Component Value Date   HGBA1C 5.2  05/11/2018   HGBA1C 5.7 (H) 12/21/2017   HGBA1C 5.2 08/04/2017   HGBA1C 5.3 07/24/2016   HGBA1C 5.6 07/31/2015   Lab Results  Component Value Date   INSULIN 16.7 05/11/2018   INSULIN 23.4 12/21/2017   CBC    Component Value Date/Time   WBC 12.5 (H) 11/19/2018 1155   WBC 11.5 (H) 07/24/2016 1238   RBC 4.44 11/19/2018 1155   RBC 5.42 (H) 07/24/2016 1238   HGB 9.5 (L) 11/19/2018 1155   HCT 30.0 (L) 11/19/2018 1155   PLT 496 (H) 11/19/2018 1155   MCV 68 (L) 11/19/2018 1155   MCH 21.4 (L) 11/19/2018 1155   MCH 23.1 (L) 07/24/2016 1238   MCHC 31.7 11/19/2018 1155   MCHC 33.6 07/24/2016 1238   RDW 15.7 (H) 11/19/2018 1155   LYMPHSABS 2.0 05/11/2018 0745   MONOABS 0.5 06/04/2014 0828   EOSABS 0.3 05/11/2018 0745   BASOSABS 0.0 05/11/2018 0745   Iron/TIBC/Ferritin/ %Sat    Component Value Date/Time   FERRITIN 19 08/04/2017 1006   Lipid Panel     Component Value Date/Time   CHOL 173 05/11/2018 0745   TRIG 128 05/11/2018 0745   HDL 44 05/11/2018 0745   CHOLHDL 3.8 08/04/2017 1006   CHOLHDL 3.6 07/24/2016 1238   VLDL 27 07/24/2016 1238   LDLCALC 103 (H) 05/11/2018 0745   Hepatic Function Panel     Component Value Date/Time   PROT 6.5 11/19/2018 1155   ALBUMIN 4.1 11/19/2018 1155   AST 11 11/19/2018 1155   ALT 10 11/19/2018 1155   ALKPHOS 70 11/19/2018 1155   BILITOT <0.2 11/19/2018 1155      Component Value Date/Time   TSH 1.060 11/19/2018 1155   TSH 1.270 12/21/2017 0959   TSH 1.140 08/04/2017 1006   Results for TIMERA, WINDT (MRN [id number]) as of 05/31/2019 07:38  Ref. Range 05/11/2018 07:45  Vitamin D, 25-Hydroxy Latest Ref Range: 30.0 - 100.0 ng/mL 46.7   OBESITY BEHAVIORAL INTERVENTION VISIT  Today's visit was #25   Starting  weight: 258 lbs Starting date: 12/21/2017 Today's weight: 227 lbs  Today's date: 05/29/2019 Total lbs lost to date: 31   05/29/2019  Height 5\' 2"  (1.575 m)  Weight 227 lb (103 kg)  BMI (Calculated) 41.51  BLOOD PRESSURE - SYSTOLIC 381  BLOOD PRESSURE - DIASTOLIC 72   Body Fat % 82.9 %  Total Body Water (lbs) 84.8 lbs   ASK: We discussed the diagnosis of obesity with Cristie Hem today and Torianne agreed to give [place] permission to discuss obesity behavioral modification therapy today.  ASSESS: Ruchel has the diagnosis of obesity and her BMI today is 41.5. Yaremi is in the action stage of change.   ADVISE: Maddyson was educated on the multiple health risks of obesity as well as the benefit of weight loss to improve her health. She was advised of the need for long term treatment and the importance of lifestyle modifications to improve her current health and to decrease her risk of future health problems.  AGREE: Multiple dietary modification options and treatment options were discussed and  Miyo agreed to follow the recommendations documented in the above note.  ARRANGE: Alayza was educated on the importance of frequent visits to treat obesity as outlined per CMS and USPSTF guidelines and agreed to schedule her next follow up appointment today.  I, Michaelene Song, am acting as Location manager for Dennard Nip, MD  I have reviewed the above documentation for accuracy and completeness, and I  agree with the above. -Dennard Nip, MD

## 2019-06-20 ENCOUNTER — Other Ambulatory Visit: Payer: Self-pay

## 2019-06-20 ENCOUNTER — Telehealth (INDEPENDENT_AMBULATORY_CARE_PROVIDER_SITE_OTHER): Payer: 59 | Admitting: Family Medicine

## 2019-06-20 ENCOUNTER — Encounter (INDEPENDENT_AMBULATORY_CARE_PROVIDER_SITE_OTHER): Payer: Self-pay | Admitting: Family Medicine

## 2019-06-20 DIAGNOSIS — F3289 Other specified depressive episodes: Secondary | ICD-10-CM

## 2019-06-20 DIAGNOSIS — E559 Vitamin D deficiency, unspecified: Secondary | ICD-10-CM

## 2019-06-20 DIAGNOSIS — R7303 Prediabetes: Secondary | ICD-10-CM | POA: Diagnosis not present

## 2019-06-20 DIAGNOSIS — Z6841 Body Mass Index (BMI) 40.0 and over, adult: Secondary | ICD-10-CM

## 2019-06-20 DIAGNOSIS — D508 Other iron deficiency anemias: Secondary | ICD-10-CM | POA: Diagnosis not present

## 2019-06-20 MED ORDER — METFORMIN HCL 500 MG PO TABS: 500.0000 mg | ORAL_TABLET | Freq: Two times a day (BID) | ORAL | 0 refills | Status: DC

## 2019-06-20 MED ORDER — BUPROPION HCL ER (SR) 200 MG PO TB12: 200.0000 mg | ORAL_TABLET | Freq: Two times a day (BID) | ORAL | 0 refills | Status: DC

## 2019-06-20 MED ORDER — FERROUS SULFATE 325 (65 FE) MG PO TABS: 325.0000 mg | ORAL_TABLET | Freq: Every day | ORAL | 0 refills | Status: DC

## 2019-06-20 MED ORDER — VITAMIN D (ERGOCALCIFEROL) 1.25 MG (50000 UNIT) PO CAPS: 50000.0000 [IU] | ORAL_CAPSULE | ORAL | 0 refills | Status: DC

## 2019-06-22 NOTE — Progress Notes (Signed)
Office: [phone]  /  Fax: [phone] TeleHealth Visit:  Heidi Robinson has verbally consented to this TeleHealth visit today. The patient is located at home, the provider is located at the [facility] office. The participants in this visit include the listed provider and patient.. The visit was conducted today via telephone call (FaceTime failed - changed to telephone call).  HPI:   Chief Complaint: OBESITY Heidi Robinson is here to discuss her progress with her obesity treatment plan. She is on the Category 2 plan and is following her eating plan approximately 25% of the time. She states she is exercising 0 minutes 0 times per week. Heidi Robinson states she has done well maintaining her weight loss. Hunger is controlled and she is happier overall with her new job. She thinks she is ready to go back to a structured plan and work on weight loss again. We were unable to weigh the patient today for this TeleHealth visit. She feels as if she has maintained her weight since her last visit. She has lost 31 lbs since starting treatment with [place].  Depression  Heidi Robinson's mood is stable on Wellbutrin. She is happier in her new job and feels more in control of her life. She shows no sign of suicidal or homicidal ideations.  Depression screen [facility] 2/9 11/19/2018 08/06/2018 12/21/2017 11/06/2017 08/04/2017  Decreased Interest 0 0 3 0 0  Down, Depressed, Hopeless 0 0 3 0 0  PHQ - 2 Score 0 0 6 0 0  Altered sleeping - - 2 - -  Tired, decreased energy - - 3 - -  Change in appetite - - 2 - -  Feeling bad or failure about yourself  - - 2 - -  Trouble concentrating - - 3 - -  Moving slowly or fidgety/restless - - 2 - -  Suicidal thoughts - - 0 - -  PHQ-9 Score - - 20 - -  Difficult doing work/chores - - Somewhat difficult - -   Iron Deficiency Anemia Heidi Robinson is stable on Feosol and denies constipation, lightheadedness, or palpitations.  Vitamin D deficiency Heidi Robinson has a diagnosis of Vitamin D deficiency. She is  currently taking prescription Vit D and denies nausea, vomiting or muscle weakness.  Pre-Diabetes Heidi Robinson has a diagnosis of prediabetes based on her elevated Hgb A1c and was informed this puts her at greater risk of developing diabetes. She hasn't been doing as well on her diet prescription recently and is ready to get back on track. She is taking metformin currently and continues to work on diet and exercise to decrease risk of diabetes. She denies nausea, vomiting, or hypoglycemia.  ASSESSMENT AND PLAN:  Class 3 severe obesity with serious comorbidity and body mass index (BMI) of 40.0 to 44.9 in adult, unspecified obesity type ([facility])  Other depression - with emotional eating - Plan: buPROPion (WELLBUTRIN SR) 200 MG 12 hr tablet  Other iron deficiency anemia - Plan: ferrous sulfate 325 (65 FE) MG tablet  Prediabetes - Plan: metFORMIN (GLUCOPHAGE) 500 MG tablet  Vitamin D deficiency - Plan: Vitamin D, Ergocalciferol, (DRISDOL) 1.25 MG (50000 UT) CAPS capsule  PLAN:  Depression  We discussed behavior modification techniques today to help Heidi Robinson deal with her emotional eating and depression. Heidi Robinson was given a refill on her Wellbutrin 200 mg #60 with 0 refills and agrees to follow-up with our clinic in 3-4 weeks.  Iron Deficiency Anemia Heidi Robinson was given a refill on her Feosol and agrees to follow-up with our clinic  in 3-4 weeks. We will check labs at her next visit.  Vitamin D Deficiency Heidi Robinson was informed that low Vitamin D levels contributes to fatigue and are associated with obesity, breast, and colon cancer. She agrees to continue to take prescription Vit D @ 50,000 IU every week #4 with 0 refills and will follow-up for routine testing of Vitamin D at her next visit. She was informed of the risk of over-replacement of Vitamin D and agrees to not increase her dose unless she discusses this with [place] first. Heidi Robinson agrees to follow-up with our clinic in 3-4 weeks.  Pre-Diabetes Heidi Robinson will continue to  work on weight loss, exercise, and decreasing simple carbohydrates in her diet to help decrease the risk of diabetes. We dicussed metformin including benefits and risks. She was informed that eating too many simple carbohydrates or too many calories at one sitting increases the likelihood of GI side effects. Heidi Robinson was given a refill on her metformin 500 mg #60 with 0 refills and agrees to follow-up with our clinic in 3-4 weeks. She will restart her diet and will have labs checked at her next visit.  Obesity Heidi Robinson is currently in the action stage of change. As such, her goal is to continue with weight loss efforts. She has agreed to follow the Category 2 plan or journal 1100-1300 calories and 75+ grams of protein. Heidi Robinson has been instructed to work up to a goal of 150 minutes of combined cardio and strengthening exercise per week for weight loss and overall health benefits. We discussed the following Behavioral Modification Strategies today: increasing lean protein intake, work on meal planning and easy cooking plans, and keep a strict food journal.  Heidi Robinson has agreed to follow-up with our clinic in 3-4 weeks. She was informed of the importance of frequent follow-up visits to maximize her success with intensive lifestyle modifications for her multiple health conditions.  ALLERGIES: Allergies  Allergen Reactions  . Penicillins Anaphylaxis  . Food     Fish, Walnuts, Cashews, Almonds, Pecans, Blueberries, [place] nuts, Pistachios, Macadamia Nuts, Coconuts, Chestnuts  . Latex     "Rash, hives maybe... I don't really remember"    MEDICATIONS: Current Outpatient Medications on File Prior to Visit  Medication Sig Dispense Refill  . albuterol (PROVENTIL) (2.5 MG/3ML) 0.083% nebulizer solution Inhale 2.5 mg into the lungs every 6 (six) hours as needed for wheezing or shortness of breath.    . lisinopril-hydrochlorothiazide (PRINZIDE,ZESTORETIC) 10-12.5 MG tablet TAKE 1 TABLET BY MOUTH ONCE DAILY 30 tablet 11   . meloxicam (MOBIC) 15 MG tablet Take 1 tablet (15 mg total) by mouth daily as needed for pain. 30 tablet 5  . Multiple Vitamin (MULTIVITAMINS PO) Take by mouth.    . polyethylene glycol powder (GLYCOLAX/MIRALAX) 17 GM/SCOOP powder Take 17 g by mouth daily. 3350 g 0   No current facility-administered medications on file prior to visit.     PAST MEDICAL HISTORY: Past Medical History:  Diagnosis Date  . Allergy   . Anemia   . Arthritis   . Asthma   . Fibroids   . Hypertension   . Iron deficiency anemia due to chronic blood loss   . Obesity (BMI 30-39.9) 02/02/2012  . Sickle cell trait ([place]) 06/04/2014   hemoglobin electrophoresis confirmed    PAST SURGICAL HISTORY: Past Surgical History:  Procedure Laterality Date  . BREAST REDUCTION SURGERY Bilateral 11/03/2018   Procedure: BILATERAL BREAST REDUCTION WITH LIPOSUCTION;  Surgeon: Wallace Going, DO;  [facility];  Service: Clinical cytogeneticist;  Laterality: Bilateral;  . tonsillecomy    . TONSILLECTOMY  1997    SOCIAL HISTORY: Social History   Tobacco Use  . Smoking status: Former Smoker    Years: 2.00    Types: Cigarettes    Quit date: 07/11/2003    Years since quitting: 15.9  . Smokeless tobacco: Never Used  Substance Use Topics  . Alcohol use: No    Alcohol/week: 0.0 standard drinks  . Drug use: No    FAMILY HISTORY: Family History  Problem Relation Age of Onset  . Hypertension Mother   . Anxiety disorder Mother   . Obesity Mother   . Kidney disease Brother   . Hypertension Maternal Grandmother   . Cancer Maternal Grandmother        bladder  . Hyperlipidemia Father   . Cancer Paternal Grandfather        prostate  . Breast cancer Neg Hx    ROS: Review of Systems  Cardiovascular: Negative for palpitations.  Gastrointestinal: Negative for constipation, nausea and vomiting.  Musculoskeletal:       Negative for muscle weakness.  Neurological:       Negative for lightheadedness.   Endo/Heme/Allergies:       Negative for hypoglycemia.  Psychiatric/Behavioral: Positive for depression. Negative for suicidal ideas.       Negative for homicidal ideas.   PHYSICAL EXAM: Pt in no acute distress  RECENT LABS AND TESTS: BMET    Component Value Date/Time   NA 139 11/19/2018 1155   K 4.3 11/19/2018 1155   CL 100 11/19/2018 1155   CO2 22 11/19/2018 1155   GLUCOSE 108 (H) 11/19/2018 1155   GLUCOSE 83 10/31/2018 1445   BUN 8 11/19/2018 1155   CREATININE 0.77 11/19/2018 1155   CREATININE 0.80 07/24/2016 1238   CALCIUM 9.6 11/19/2018 1155   GFRNONAA 96 11/19/2018 1155   GFRAA 110 11/19/2018 1155   Lab Results  Component Value Date   HGBA1C 5.2 05/11/2018   HGBA1C 5.7 (H) 12/21/2017   HGBA1C 5.2 08/04/2017   HGBA1C 5.3 07/24/2016   HGBA1C 5.6 07/31/2015   Lab Results  Component Value Date   INSULIN 16.7 05/11/2018   INSULIN 23.4 12/21/2017   CBC    Component Value Date/Time   WBC 12.5 (H) 11/19/2018 1155   WBC 11.5 (H) 07/24/2016 1238   RBC 4.44 11/19/2018 1155   RBC 5.42 (H) 07/24/2016 1238   HGB 9.5 (L) 11/19/2018 1155   HCT 30.0 (L) 11/19/2018 1155   PLT 496 (H) 11/19/2018 1155   MCV 68 (L) 11/19/2018 1155   MCH 21.4 (L) 11/19/2018 1155   MCH 23.1 (L) 07/24/2016 1238   MCHC 31.7 11/19/2018 1155   MCHC 33.6 07/24/2016 1238   RDW 15.7 (H) 11/19/2018 1155   LYMPHSABS 2.0 05/11/2018 0745   MONOABS 0.5 06/04/2014 0828   EOSABS 0.3 05/11/2018 0745   BASOSABS 0.0 05/11/2018 0745   Iron/TIBC/Ferritin/ %Sat    Component Value Date/Time   FERRITIN 19 08/04/2017 1006   Lipid Panel     Component Value Date/Time   CHOL 173 05/11/2018 0745   TRIG 128 05/11/2018 0745   HDL 44 05/11/2018 0745   CHOLHDL 3.8 08/04/2017 1006   CHOLHDL 3.6 07/24/2016 1238   VLDL 27 07/24/2016 1238   LDLCALC 103 (H) 05/11/2018 0745   Hepatic Function Panel     Component Value Date/Time   PROT 6.5 11/19/2018 1155   ALBUMIN 4.1 11/19/2018 1155   AST  11 11/19/2018  1155   ALT 10 11/19/2018 1155   ALKPHOS 70 11/19/2018 1155   BILITOT <0.2 11/19/2018 1155      Component Value Date/Time   TSH 1.060 11/19/2018 1155   TSH 1.270 12/21/2017 0959   TSH 1.140 08/04/2017 1006   Results for SHYTERIA, LEWIS (MRN [id number]) as of 06/22/2019 09:21  Ref. Range 05/11/2018 07:45  Vitamin D, 25-Hydroxy Latest Ref Range: 30.0 - 100.0 ng/mL 46.7   I, Michaelene Song, am acting as Location manager for Dennard Nip, MD I have reviewed the above documentation for accuracy and completeness, and I agree with the above. -Dennard Nip, MD

## 2019-07-18 ENCOUNTER — Other Ambulatory Visit: Payer: Self-pay

## 2019-07-18 ENCOUNTER — Encounter (INDEPENDENT_AMBULATORY_CARE_PROVIDER_SITE_OTHER): Payer: Self-pay | Admitting: Family Medicine

## 2019-07-18 ENCOUNTER — Ambulatory Visit (INDEPENDENT_AMBULATORY_CARE_PROVIDER_SITE_OTHER): Payer: 59 | Admitting: Family Medicine

## 2019-07-18 VITALS — BP 107/72 | HR 95 | Temp 98.0°F | Ht 62.0 in | Wt 234.0 lb

## 2019-07-18 DIAGNOSIS — E559 Vitamin D deficiency, unspecified: Secondary | ICD-10-CM | POA: Diagnosis not present

## 2019-07-18 DIAGNOSIS — Z9189 Other specified personal risk factors, not elsewhere classified: Secondary | ICD-10-CM | POA: Diagnosis not present

## 2019-07-18 DIAGNOSIS — D508 Other iron deficiency anemias: Secondary | ICD-10-CM

## 2019-07-18 DIAGNOSIS — R7303 Prediabetes: Secondary | ICD-10-CM | POA: Diagnosis not present

## 2019-07-18 DIAGNOSIS — Z6841 Body Mass Index (BMI) 40.0 and over, adult: Secondary | ICD-10-CM

## 2019-07-18 DIAGNOSIS — F3289 Other specified depressive episodes: Secondary | ICD-10-CM

## 2019-07-18 MED ORDER — METFORMIN HCL 500 MG PO TABS: 500.0000 mg | ORAL_TABLET | Freq: Three times a day (TID) | ORAL | 0 refills | Status: DC

## 2019-07-18 MED ORDER — FERROUS SULFATE 325 (65 FE) MG PO TABS: 325.0000 mg | ORAL_TABLET | Freq: Every day | ORAL | 0 refills | Status: DC

## 2019-07-18 MED ORDER — VITAMIN D (ERGOCALCIFEROL) 1.25 MG (50000 UNIT) PO CAPS: 50000.0000 [IU] | ORAL_CAPSULE | ORAL | 0 refills | Status: DC

## 2019-07-18 MED ORDER — BUPROPION HCL ER (SR) 200 MG PO TB12: 200.0000 mg | ORAL_TABLET | Freq: Two times a day (BID) | ORAL | 0 refills | Status: DC

## 2019-07-19 NOTE — Progress Notes (Signed)
Office: [phone]  /  Fax: [phone]   HPI:   Chief Complaint: OBESITY Heidi Robinson is here to discuss her progress with her obesity treatment plan. She is on the keep a food journal with 1100-1300 calories and 75+ grams of protein daily or follow the Category 2 plan and is following her eating plan approximately 20 % of the time. She states she is walking and using free weights for 40-45 minutes 4 times per week. Heidi Robinson has been using protein supplements more in the last few weeks, and has gained weight. She is not following her plan closely and notes increased hunger.  Her weight is 234 lb (106.1 kg) today and has gained 7 lbs since her last visit. She has lost 0 lbs since starting treatment with [place].  Pre-Diabetes Heidi Robinson has a diagnosis of pre-diabetes based on her elevated Hgb A1c and was informed this puts her at greater risk of developing diabetes. She is taking metformin currently and she is still struggling with polyphagia, worse in the PM and worse with decreased protein. She continues to work on diet and exercise to decrease risk of diabetes. She denies nausea or hypoglycemia.  At risk for diabetes Heidi Robinson is at higher than average risk for developing diabetes due to her obesity and pre-diabetes. She currently denies polyuria or polydipsia.  Vitamin D Deficiency Heidi Robinson has a diagnosis of vitamin D deficiency. She is stable on prescription Vit D, and she has not gotten her labs again. She denies nausea, vomiting or muscle weakness.  Iron Deficiency Anemia Heidi Robinson has a diagnosis of anemia. She is stable on iron supplementation, and didn't get her labs done.  Depression with Emotional Eating Behaviors Heidi Robinson's mood is stable, but she is struggling to avoid emotional eating. Heidi Robinson struggles with emotional eating and using food for comfort to the extent that it is negatively impacting her health. She often snacks when she is not hungry. Heidi Robinson sometimes feels she is out of control and then feels guilty  that she made poor food choices. She has been working on behavior modification techniques to help reduce her emotional eating and has been somewhat successful. She shows no sign of suicidal or homicidal ideations.  Depression screen Heidi Robinson28/2019 12/21/2017 11/06/2017 Robinson26/2018  Decreased Interest 0 0 3 0 0  Down, Depressed, Hopeless 0 0 3 0 0  PHQ - 2 Score 0 0 6 0 0  Altered sleeping - - 2 - -  Tired, decreased energy - - 3 - -  Change in appetite - - 2 - -  Feeling bad or failure about yourself  - - 2 - -  Trouble concentrating - - 3 - -  Moving slowly or fidgety/restless - - 2 - -  Suicidal thoughts - - 0 - -  PHQ-9 Score - - 20 - -  Difficult doing work/chores - - Somewhat difficult - -    ASSESSMENT AND PLAN:  At risk for diabetes mellitus  Prediabetes - Plan: metFORMIN (GLUCOPHAGE) 500 MG tablet  Vitamin D deficiency - Plan: Vitamin D, Ergocalciferol, (DRISDOL) 1.25 MG (50000 UT) CAPS capsule  Other iron deficiency anemia - Plan: ferrous sulfate 325 (65 FE) MG tablet  Other depression - with emotional eating - Plan: buPROPion (WELLBUTRIN SR) 200 MG 12 hr tablet  Class 3 severe obesity with serious comorbidity and body mass index (BMI) of 40.0 to 44.9 in adult, unspecified obesity type (HCC)  PLAN:  Pre-Diabetes Heidi Robinson will continue to work on weight loss, exercise, and  decreasing simple carbohydrates in her diet to help decrease the risk of diabetes. We dicussed metformin including benefits and risks. She was informed that eating too many simple carbohydrates or too many calories at one sitting increases the likelihood of GI side effects. Heidi Robinson agrees to increase metformin to 500 mg TID #90 with no refills; and she is to get labs as ordered. Heidi Robinson agrees to follow up with our clinic in 2 to 3 weeks as directed to monitor her progress.  Diabetes risk counseling Heidi Robinson was given extended (15 minutes) diabetes prevention counseling today. She is 43 y.o. female and has  risk factors for diabetes including obesity and pre-diabetes. We discussed intensive lifestyle modifications today with an emphasis on weight loss as well as increasing exercise and decreasing simple carbohydrates in her diet.  Vitamin D Deficiency Heidi Robinson was informed that low vitamin D levels contributes to fatigue and are associated with obesity, breast, and colon cancer. Heidi Robinson agrees to continue taking prescription Vit D 50,000 IU every week #4 and we will refill for 1 month. She will follow up for routine testing of vitamin D, at least 2-3 times per year. She was informed of the risk of over-replacement of vitamin D and agrees to not increase her dose unless she discusses this with [place] first. She is to get labs as ordered. Heidi Robinson agrees to follow up with our clinic in 2 to 3 weeks.  Iron Deficiency Anemia The diagnosis of Iron deficiency anemia was discussed with Heidi Robinson and was explained in detail. She was given suggestions of iron rich foods. Heidi Robinson agrees to continue taking FeSoy 325 mg q AM #30 and we will refill for 1 month; and she is to get las as ordered. Heidi Robinson agrees to follow up with our clinic in 2 to 3 weeks.  Depression with Emotional Eating Behaviors We discussed behavior modification techniques today to help Heidi Robinson deal with her emotional eating and depression. Heidi Robinson agrees to continue taking Wellbutrin SR 200 mg PO BID #60 and we will refill for 1 month. Heidi Robinson agrees to follow up with our clinic in 2 to 3 weeks.  Obesity Heidi Robinson is currently in the action stage of change. As such, her goal is to continue with weight loss efforts She has agreed to follow the Category 2 plan Heidi Robinson has been instructed to work up to a goal of 150 minutes of combined cardio and strengthening exercise per week for weight loss and overall health benefits. We discussed the following Behavioral Modification Strategies today: increasing lean protein intake, decreasing simple carbohydrates  and work on meal planning and easy  cooking plans   Heidi Robinson has agreed to follow up with our clinic in 2 to 3 weeks. She was informed of the importance of frequent follow up visits to maximize her success with intensive lifestyle modifications for her multiple health conditions.  ALLERGIES: Allergies  Allergen Reactions   Penicillins Anaphylaxis   Food     Fish, Walnuts, Cashews, Almonds, Pecans, Blueberries, Pine nuts, Pistachios, Macadamia Nuts, Coconuts, Chestnuts   Latex     "Rash, hives maybe... I don't really remember"    MEDICATIONS: Current Outpatient Medications on File Prior to Visit  Medication Sig Dispense Refill   albuterol (PROVENTIL) (2.5 MG/3ML) 0.083% nebulizer solution Inhale 2.5 mg into the lungs every 6 (six) hours as needed for wheezing or shortness of breath.     lisinopril-hydrochlorothiazide (PRINZIDE,ZESTORETIC) 10-12.5 MG tablet TAKE 1 TABLET BY MOUTH ONCE DAILY 30 tablet 11   meloxicam (MOBIC) 15 MG  tablet Take 1 tablet (15 mg total) by mouth daily as needed for pain. 30 tablet 5   Multiple Vitamin (MULTIVITAMINS PO) Take by mouth.     polyethylene glycol powder (GLYCOLAX/MIRALAX) 17 GM/SCOOP powder Take 17 g by mouth daily. 3350 g 0   No current facility-administered medications on file prior to visit.     PAST MEDICAL HISTORY: Past Medical History:  Diagnosis Date   Allergy    Anemia    Arthritis    Asthma    Fibroids    Hypertension    Iron deficiency anemia due to chronic blood loss    Obesity (BMI 30-39.9) 02/02/2012   Sickle cell trait ([place]) 06/04/2014   hemoglobin electrophoresis confirmed    PAST SURGICAL HISTORY: Past Surgical History:  Procedure Laterality Date   BREAST REDUCTION SURGERY Bilateral 11/03/2018   Procedure: BILATERAL BREAST REDUCTION WITH LIPOSUCTION;  Surgeon: Wallace Going, DO;  Location: [facility];  Service: Plastics;  Laterality: Bilateral;   tonsillecomy     TONSILLECTOMY  1997    SOCIAL HISTORY: Social  History   Tobacco Use   Smoking status: Former Smoker    Years: 2.00    Types: Cigarettes    Quit date: Robinson1/2004    Years since quitting: 16.0   Smokeless tobacco: Never Used  Substance Use Topics   Alcohol use: No    Alcohol/week: 0.0 standard drinks   Drug use: No    FAMILY HISTORY: Family History  Problem Relation Age of Onset   Hypertension Mother    Anxiety disorder Mother    Obesity Mother    Kidney disease Brother    Hypertension Maternal Grandmother    Cancer Maternal Grandmother        bladder   Hyperlipidemia Father    Cancer Paternal Grandfather        prostate   Breast cancer Neg Hx     ROS: Review of Systems  Constitutional: Negative for weight loss.  Gastrointestinal: Negative for nausea and vomiting.  Genitourinary: Negative for frequency.  Musculoskeletal:       Negative muscle weakness  Endo/Heme/Allergies: Negative for polydipsia.       Positive polyphagia Negative hypoglycemia  Psychiatric/Behavioral: Positive for depression. Negative for suicidal ideas.    PHYSICAL EXAM: Blood pressure 107/72, pulse 95, temperature 98 F (36.7 C), temperature source Oral, height 5\' 2"  (1.575 m), weight 234 lb (106.1 kg), last menstrual period 07/02/2019, SpO2 100 %. Body mass index is 42.8 kg/m. Physical Exam Vitals signs reviewed.  Constitutional:      Appearance: Normal appearance. She is obese.  Cardiovascular:     Rate and Rhythm: Normal rate.     Pulses: Normal pulses.  Pulmonary:     Effort: Pulmonary effort is normal.     Breath sounds: Normal breath sounds.  Musculoskeletal: Normal range of motion.  Skin:    General: Skin is warm and dry.  Neurological:     Mental Status: She is alert and oriented to person, place, and time.  Psychiatric:        Mood and Affect: Mood normal.        Behavior: Behavior normal.     RECENT LABS AND TESTS: BMET    Component Value Date/Time   NA 139 11/19/2018 1155   K 4.3 11/19/2018 1155    CL 100 11/19/2018 1155   CO2 22 11/19/2018 1155   GLUCOSE 108 (H) 11/19/2018 1155   GLUCOSE 83 10/31/2018 1445   BUN 8 11/19/2018  1155   CREATININE 0.77 11/19/2018 1155   CREATININE 0.80 07/24/2016 1238   CALCIUM 9.6 11/19/2018 1155   GFRNONAA 96 11/19/2018 1155   GFRAA 110 11/19/2018 1155   Lab Results  Component Value Date   HGBA1C 5.2 05/11/2018   HGBA1C 5.7 (H) 12/21/2017   HGBA1C 5.2 08/04/2017   HGBA1C 5.3 07/24/2016   HGBA1C 5.6 07/31/2015   Lab Results  Component Value Date   INSULIN 16.7 05/11/2018   INSULIN 23.4 12/21/2017   CBC    Component Value Date/Time   WBC 12.5 (H) 11/19/2018 1155   WBC 11.5 (H) 07/24/2016 1238   RBC 4.44 11/19/2018 1155   RBC 5.42 (H) 07/24/2016 1238   HGB 9.5 (L) 11/19/2018 1155   HCT 30.0 (L) 11/19/2018 1155   PLT 496 (H) 11/19/2018 1155   MCV 68 (L) 11/19/2018 1155   MCH 21.4 (L) 11/19/2018 1155   MCH 23.1 (L) 07/24/2016 1238   MCHC 31.7 11/19/2018 1155   MCHC 33.6 07/24/2016 1238   RDW 15.7 (H) 11/19/2018 1155   LYMPHSABS 2.0 05/11/2018 0745   MONOABS 0.5 06/04/2014 0828   EOSABS 0.3 05/11/2018 0745   BASOSABS 0.0 05/11/2018 0745   Iron/TIBC/Ferritin/ %Sat    Component Value Date/Time   FERRITIN 19 08/04/2017 1006   Lipid Panel     Component Value Date/Time   CHOL 173 05/11/2018 0745   TRIG 128 05/11/2018 0745   HDL 44 05/11/2018 0745   CHOLHDL 3.8 08/04/2017 1006   CHOLHDL 3.6 07/24/2016 1238   VLDL 27 07/24/2016 1238   LDLCALC 103 (H) 05/11/2018 0745   Hepatic Function Panel     Component Value Date/Time   PROT 6.5 11/19/2018 1155   ALBUMIN 4.1 11/19/2018 1155   AST 11 11/19/2018 1155   ALT 10 11/19/2018 1155   ALKPHOS 70 11/19/2018 1155   BILITOT <0.2 11/19/2018 1155      Component Value Date/Time   TSH 1.060 11/19/2018 1155   TSH 1.270 12/21/2017 0959   TSH 1.140 08/04/2017 1006      OBESITY BEHAVIORAL INTERVENTION VISIT  Today's visit was # 27   Starting weight: 221 lbs Starting date:  12/21/17 Today's weight : 234 lbs Today's date: Robinson8/2020 Total lbs lost to date: 0    ASK: We discussed the diagnosis of obesity with Cristie Hem today and Panhia agreed to give [place] permission to discuss obesity behavioral modification therapy today.  ASSESS: Kitrina has the diagnosis of obesity and her BMI today is 42.79 [place] is in the action stage of change   ADVISE: Aryahna was educated on the multiple health risks of obesity as well as the benefit of weight loss to improve her health. She was advised of the need for long term treatment and the importance of lifestyle modifications to improve her current health and to decrease her risk of future health problems.  AGREE: Multiple dietary modification options and treatment options were discussed and  Montanna agreed to follow the recommendations documented in the above note.  ARRANGE: Tarissa was educated on the importance of frequent visits to treat obesity as outlined per CMS and USPSTF guidelines and agreed to schedule her next follow up appointment today.  I, Trixie Dredge, am acting as transcriptionist for Dennard Nip, MD  I have reviewed the above documentation for accuracy and completeness, and I agree with the above. -Dennard Nip, MD

## 2019-08-15 ENCOUNTER — Telehealth (INDEPENDENT_AMBULATORY_CARE_PROVIDER_SITE_OTHER): Payer: 59 | Admitting: Family Medicine

## 2019-08-15 ENCOUNTER — Other Ambulatory Visit: Payer: Self-pay

## 2019-08-15 ENCOUNTER — Encounter (INDEPENDENT_AMBULATORY_CARE_PROVIDER_SITE_OTHER): Payer: Self-pay | Admitting: Family Medicine

## 2019-08-15 DIAGNOSIS — F3289 Other specified depressive episodes: Secondary | ICD-10-CM

## 2019-08-15 DIAGNOSIS — E559 Vitamin D deficiency, unspecified: Secondary | ICD-10-CM

## 2019-08-15 DIAGNOSIS — D508 Other iron deficiency anemias: Secondary | ICD-10-CM

## 2019-08-15 DIAGNOSIS — Z6841 Body Mass Index (BMI) 40.0 and over, adult: Secondary | ICD-10-CM

## 2019-08-15 DIAGNOSIS — R7303 Prediabetes: Secondary | ICD-10-CM | POA: Diagnosis not present

## 2019-08-15 MED ORDER — METFORMIN HCL 500 MG PO TABS: 500.0000 mg | ORAL_TABLET | Freq: Three times a day (TID) | ORAL | 0 refills | Status: DC

## 2019-08-15 MED ORDER — VITAMIN D (ERGOCALCIFEROL) 1.25 MG (50000 UNIT) PO CAPS: 50000.0000 [IU] | ORAL_CAPSULE | ORAL | 0 refills | Status: DC

## 2019-08-15 MED ORDER — BUPROPION HCL ER (SR) 200 MG PO TB12: 200.0000 mg | ORAL_TABLET | Freq: Two times a day (BID) | ORAL | 0 refills | Status: DC

## 2019-08-15 MED ORDER — FERROUS SULFATE 325 (65 FE) MG PO TABS: 325.0000 mg | ORAL_TABLET | Freq: Every day | ORAL | 0 refills | Status: DC

## 2019-08-16 NOTE — Progress Notes (Signed)
Office: [phone]  /  Fax: [phone] TeleHealth Visit:  Heidi Robinson has verbally consented to this TeleHealth visit today. The patient is located at home, the provider is located at the [employer] [facility] office. The participants in this visit include the listed provider and patient. The visit was conducted today via telephone call (FaceTime failed - changed to telephone call).  HPI:   Chief Complaint: OBESITY Heidi Robinson is here to discuss her progress with her obesity treatment plan. She is on the Category 2 plan and is following her eating plan approximately 30% of the time. She states she is walking 2 miles and using free weights 55 minutes 4 times per week. Thaily has had increased stress at work and has increased drive-thru meals and decreased meal planning. She thinks she has maintained her weight. She is especially struggling with making easy meals. We were unable to weigh the patient today for this TeleHealth visit. She feels as if she has maintained her weight since her last visit. She has lost 0 lbs since starting treatment with [place].  Pre-Diabetes Heidi Robinson has a diagnosis of prediabetes based on her elevated Hgb A1c and was informed this puts her at greater risk of developing diabetes. She is stable on metformin currently and continues to work on diet and exercise to decrease risk of diabetes. She denies nausea or hypoglycemia.  Vitamin D deficiency Heidi Robinson has a diagnosis of Vitamin D deficiency. She is currently taking prescription Vit D and denies nausea, vomiting or muscle weakness.  Depression with emotional eating behaviors Heidi Robinson is struggling with emotional eating and using food for comfort to the extent that it is negatively impacting her health. She often snacks when she is not hungry. Heidi Robinson sometimes feels she is out of control and then feels guilty that she made poor food choices. She has been working on behavior modification techniques to help reduce her emotional eating and  has been somewhat successful. Heidi Robinson reports her mood to be stable but has increased work stress and increased emotional eating. She reports increased fatigue. She shows no sign of suicidal or homicidal ideations.  Depression screen [name] 2/9 11/19/2018 08/06/2018 12/21/2017 11/06/2017 08/04/2017  Decreased Interest 0 0 3 0 0  Down, Depressed, Hopeless 0 0 3 0 0  PHQ - 2 Score 0 0 6 0 0  Altered sleeping - - 2 - -  Tired, decreased energy - - 3 - -  Change in appetite - - 2 - -  Feeling bad or failure about yourself  - - 2 - -  Trouble concentrating - - 3 - -  Moving slowly or fidgety/restless - - 2 - -  Suicidal thoughts - - 0 - -  PHQ-9 Score - - 20 - -  Difficult doing work/chores - - Somewhat difficult - -   Iron Deficiency Anemia Heidi Robinson has a diagnosis of iron deficiency anemia and is stable. She denies constipation or palpitations.  ASSESSMENT AND PLAN:  Prediabetes - Plan: metFORMIN (GLUCOPHAGE) 500 MG tablet  Vitamin D deficiency - Plan: Vitamin D, Ergocalciferol, (DRISDOL) 1.25 MG (50000 UT) CAPS capsule  Other iron deficiency anemia - Plan: ferrous sulfate 325 (65 FE) MG tablet  Other depression - with emotional eating - Plan: buPROPion (WELLBUTRIN SR) 200 MG 12 hr tablet  Class 3 severe obesity with serious comorbidity and body mass index (BMI) of 40.0 to 44.9 in adult, unspecified obesity type ([name])  PLAN:  Pre-Diabetes Heidi Robinson will continue to work on weight loss, exercise,  and decreasing simple carbohydrates in her diet to help decrease the risk of diabetes. We dicussed metformin including benefits and risks. She was informed that eating too many simple carbohydrates or too many calories at one sitting increases the likelihood of GI side effects. [name] was given a refill on her metformin 500 mg TID #90 with 0 refills. She agrees to follow-up with our clinic in 3 weeks and will get labs as previously ordered.  Vitamin D Deficiency Heidi Robinson was informed that low Vitamin D levels  contributes to fatigue and are associated with obesity, breast, and colon cancer. She agrees to continue to take prescription Vit D @ 50,000 IU every week #4 with 0 refills and will follow-up for routine testing of Vitamin D as previously ordered. She was informed of the risk of over-replacement of Vitamin D and agrees to not increase her dose unless she discusses this with [place] first. Heidi Robinson agrees to follow-up with our clinic in 3 weeks.  Depression with Emotional Eating Behaviors We discussed behavior modification techniques today to help Heidi Robinson deal with her emotional eating and depression. [name] was given a refill on her Wellbutrin 200 mg BID #60 with 0 refills and agrees to follow-up with our clinic in 3 weeks. CBT to help with time management discussed.  Iron Deficiency Anemia [name] was given a refill on her Ferrous Sulfate 325 mg #30 with 0 refills and agrees to follow-up with our clinic in 3 weeks. She will have labs checked as previously ordered.  Obesity Heidi Robinson is currently in the action stage of change. As such, her goal is to continue with weight loss efforts. She has agreed to follow the Category 2 plan. Shredded [employer] were given. Heidi Robinson has been instructed to work up to a goal of 150 minutes of combined cardio and strengthening exercise per week for weight loss and overall health benefits. We discussed the following Behavioral Modification Strategies today: increasing lean protein intake, decreasing simple carbohydrates, work on meal planning and easy cooking plans.  Heidi Robinson has agreed to follow-up with our clinic in 3 weeks. She was informed of the importance of frequent follow-up visits to maximize her success with intensive lifestyle modifications for her multiple health conditions.  ALLERGIES: Allergies  Allergen Reactions  . Penicillins Anaphylaxis  . Food     Fish, Walnuts, Cashews, Almonds, Pecans, Blueberries, [place] nuts, Pistachios, Macadamia Nuts, Coconuts, Chestnuts   . Latex     "Rash, hives maybe... I don't really remember"    MEDICATIONS: Current Outpatient Medications on File Prior to Visit  Medication Sig Dispense Refill  . albuterol (PROVENTIL) (2.5 MG/3ML) 0.083% nebulizer solution Inhale 2.5 mg into the lungs every 6 (six) hours as needed for wheezing or shortness of breath.    . lisinopril-hydrochlorothiazide (PRINZIDE,ZESTORETIC) 10-12.5 MG tablet TAKE 1 TABLET BY MOUTH ONCE DAILY 30 tablet 11  . meloxicam (MOBIC) 15 MG tablet Take 1 tablet (15 mg total) by mouth daily as needed for pain. 30 tablet 5  . Multiple Vitamin (MULTIVITAMINS PO) Take by mouth.    . polyethylene glycol powder (GLYCOLAX/MIRALAX) 17 GM/SCOOP powder Take 17 g by mouth daily. 3350 g 0   No current facility-administered medications on file prior to visit.     PAST MEDICAL HISTORY: Past Medical History:  Diagnosis Date  . Allergy   . Anemia   . Arthritis   . Asthma   . Fibroids   . Hypertension   . Iron deficiency anemia due to chronic blood loss   .  Obesity (BMI 30-39.9) 02/02/2012  . Sickle cell trait ([place]) 06/04/2014   hemoglobin electrophoresis confirmed    PAST SURGICAL HISTORY: Past Surgical History:  Procedure Laterality Date  . BREAST REDUCTION SURGERY Bilateral 11/03/2018   Procedure: BILATERAL BREAST REDUCTION WITH LIPOSUCTION;  Surgeon: Wallace Going, DO;  Location: [facility];  Service: Plastics;  Laterality: Bilateral;  . tonsillecomy    . TONSILLECTOMY  1997    SOCIAL HISTORY: Social History   Tobacco Use  . Smoking status: Former Smoker    Years: 2.00    Types: Cigarettes    Quit date: 07/11/2003    Years since quitting: 16.1  . Smokeless tobacco: Never Used  Substance Use Topics  . Alcohol use: No    Alcohol/week: 0.0 standard drinks  . Drug use: No    FAMILY HISTORY: Family History  Problem Relation Age of Onset  . Hypertension Mother   . Anxiety disorder Mother   . Obesity Mother   . Kidney disease  Brother   . Hypertension Maternal Grandmother   . Cancer Maternal Grandmother        bladder  . Hyperlipidemia Father   . Cancer Paternal Grandfather        prostate  . Breast cancer Neg Hx    ROS: Review of Systems  Cardiovascular: Negative for palpitations.  Gastrointestinal: Negative for constipation, nausea and vomiting.  Musculoskeletal:       Negative for muscle weakness.  Endo/Heme/Allergies:       Negative for hypoglycemia.  Psychiatric/Behavioral: Positive for depression. Negative for suicidal ideas.       Negative for homicidal ideas.   PHYSICAL EXAM: Pt in no acute distress  RECENT LABS AND TESTS: BMET    Component Value Date/Time   NA 139 11/19/2018 1155   K 4.3 11/19/2018 1155   CL 100 11/19/2018 1155   CO2 22 11/19/2018 1155   GLUCOSE 108 (H) 11/19/2018 1155   GLUCOSE 83 10/31/2018 1445   BUN 8 11/19/2018 1155   CREATININE 0.77 11/19/2018 1155   CREATININE 0.80 07/24/2016 1238   CALCIUM 9.6 11/19/2018 1155   GFRNONAA 96 11/19/2018 1155   GFRAA 110 11/19/2018 1155   Lab Results  Component Value Date   HGBA1C 5.2 05/11/2018   HGBA1C 5.7 (H) 12/21/2017   HGBA1C 5.2 08/04/2017   HGBA1C 5.3 07/24/2016   HGBA1C 5.6 07/31/2015   Lab Results  Component Value Date   INSULIN 16.7 05/11/2018   INSULIN 23.4 12/21/2017   CBC    Component Value Date/Time   WBC 12.5 (H) 11/19/2018 1155   WBC 11.5 (H) 07/24/2016 1238   RBC 4.44 11/19/2018 1155   RBC 5.42 (H) 07/24/2016 1238   HGB 9.5 (L) 11/19/2018 1155   HCT 30.0 (L) 11/19/2018 1155   PLT 496 (H) 11/19/2018 1155   MCV 68 (L) 11/19/2018 1155   MCH 21.4 (L) 11/19/2018 1155   MCH 23.1 (L) 07/24/2016 1238   MCHC 31.7 11/19/2018 1155   MCHC 33.6 07/24/2016 1238   RDW 15.7 (H) 11/19/2018 1155   LYMPHSABS 2.0 05/11/2018 0745   MONOABS 0.5 06/04/2014 0828   EOSABS 0.3 05/11/2018 0745   BASOSABS 0.0 05/11/2018 0745   Iron/TIBC/Ferritin/ %Sat    Component Value Date/Time   FERRITIN 19 08/04/2017 1006    Lipid Panel     Component Value Date/Time   CHOL 173 05/11/2018 0745   TRIG 128 05/11/2018 0745   HDL 44 05/11/2018 0745   CHOLHDL 3.8 08/04/2017 1006  CHOLHDL 3.6 07/24/2016 1238   VLDL 27 07/24/2016 1238   LDLCALC 103 (H) 05/11/2018 0745   Hepatic Function Panel     Component Value Date/Time   PROT 6.5 11/19/2018 1155   ALBUMIN 4.1 11/19/2018 1155   AST 11 11/19/2018 1155   ALT 10 11/19/2018 1155   ALKPHOS 70 11/19/2018 1155   BILITOT <0.2 11/19/2018 1155      Component Value Date/Time   TSH 1.060 11/19/2018 1155   TSH 1.270 12/21/2017 0959   TSH 1.140 08/04/2017 1006   Results for ALIZANDRA, CANIPE (MRN [id number]) as of 08/16/2019 10:38  Ref. Range 05/11/2018 07:45  Vitamin D, 25-Hydroxy Latest Ref Range: 30.0 - 100.0 ng/mL 46.7   I, Michaelene Song, am acting as Location manager for Dennard Nip, MD I have reviewed the above documentation for accuracy and completeness, and I agree with the above. -Dennard Nip, MD

## 2019-08-31 ENCOUNTER — Telehealth: Payer: Self-pay | Admitting: Family Medicine

## 2019-08-31 NOTE — Telephone Encounter (Signed)
Pt is needing a med refill of her bp medication. The earliest appt she could get w/ Dr Nolon Rod for a TOC/CPE was in December.Please advise.

## 2019-09-01 NOTE — Telephone Encounter (Signed)
Patient is calling back about this request. Please contact patient.

## 2019-09-05 ENCOUNTER — Other Ambulatory Visit: Payer: Self-pay

## 2019-09-05 ENCOUNTER — Encounter (INDEPENDENT_AMBULATORY_CARE_PROVIDER_SITE_OTHER): Payer: Self-pay | Admitting: Family Medicine

## 2019-09-05 ENCOUNTER — Ambulatory Visit (INDEPENDENT_AMBULATORY_CARE_PROVIDER_SITE_OTHER): Payer: 59 | Admitting: Family Medicine

## 2019-09-05 VITALS — BP 121/83 | HR 95 | Temp 97.9°F | Ht 62.0 in | Wt 231.0 lb

## 2019-09-05 DIAGNOSIS — D508 Other iron deficiency anemias: Secondary | ICD-10-CM

## 2019-09-05 DIAGNOSIS — Z9189 Other specified personal risk factors, not elsewhere classified: Secondary | ICD-10-CM

## 2019-09-05 DIAGNOSIS — Z6841 Body Mass Index (BMI) 40.0 and over, adult: Secondary | ICD-10-CM

## 2019-09-05 DIAGNOSIS — F3289 Other specified depressive episodes: Secondary | ICD-10-CM

## 2019-09-05 DIAGNOSIS — R7303 Prediabetes: Secondary | ICD-10-CM | POA: Diagnosis not present

## 2019-09-05 DIAGNOSIS — I10 Essential (primary) hypertension: Secondary | ICD-10-CM

## 2019-09-05 DIAGNOSIS — E559 Vitamin D deficiency, unspecified: Secondary | ICD-10-CM | POA: Diagnosis not present

## 2019-09-05 MED ORDER — FERROUS SULFATE 325 (65 FE) MG PO TABS: 325.0000 mg | ORAL_TABLET | Freq: Every day | ORAL | 0 refills | Status: DC

## 2019-09-05 MED ORDER — METFORMIN HCL 500 MG PO TABS: 500.0000 mg | ORAL_TABLET | Freq: Three times a day (TID) | ORAL | 0 refills | Status: DC

## 2019-09-05 MED ORDER — VITAMIN D (ERGOCALCIFEROL) 1.25 MG (50000 UNIT) PO CAPS: 50000.0000 [IU] | ORAL_CAPSULE | ORAL | 0 refills | Status: DC

## 2019-09-05 MED ORDER — LISINOPRIL-HYDROCHLOROTHIAZIDE 10-12.5 MG PO TABS: 1.0000 | ORAL_TABLET | Freq: Every day | ORAL | 0 refills | Status: DC

## 2019-09-05 MED ORDER — BUPROPION HCL ER (SR) 200 MG PO TB12: 200.0000 mg | ORAL_TABLET | Freq: Two times a day (BID) | ORAL | 0 refills | Status: DC

## 2019-09-06 LAB — CBC WITH DIFFERENTIAL
Basophils Absolute: 0 10*3/uL (ref 0.0–0.2)
Basos: 0 %
EOS (ABSOLUTE): 0.4 10*3/uL (ref 0.0–0.4)
Eos: 3 %
Hemoglobin: 10.3 g/dL — ABNORMAL LOW (ref 11.1–15.9)
Immature Grans (Abs): 0.1 10*3/uL (ref 0.0–0.1)
Immature Granulocytes: 1 %
Lymphocytes Absolute: 1.9 10*3/uL (ref 0.7–3.1)
Lymphs: 15 %
MCH: 22 pg — ABNORMAL LOW (ref 26.6–33.0)
MCHC: 31.7 g/dL (ref 31.5–35.7)
MCV: 69 fL — ABNORMAL LOW (ref 79–97)
Monocytes Absolute: 0.6 10*3/uL (ref 0.1–0.9)
Monocytes: 5 %
Neutrophils Absolute: 9.4 10*3/uL — ABNORMAL HIGH (ref 1.4–7.0)
Neutrophils: 76 %
RBC: 4.68 x10E6/uL (ref 3.77–5.28)
RDW: 14.7 % (ref 11.7–15.4)
WBC: 12.4 10*3/uL — ABNORMAL HIGH (ref 3.4–10.8)

## 2019-09-06 LAB — ANEMIA PANEL
Ferritin: 13 ng/mL — ABNORMAL LOW (ref 15–150)
Folate, Hemolysate: 480 ng/mL
Folate, RBC: 1477 ng/mL (ref >498)
Hematocrit: 32.5 % — ABNORMAL LOW (ref 34.0–46.6)
Iron Saturation: 7 % — CL (ref 15–55)
Iron: 25 ug/dL — ABNORMAL LOW (ref 27–159)
Retic Ct Pct: 2.5 % (ref 0.6–2.6)
Total Iron Binding Capacity: 367 ug/dL (ref 250–450)
UIBC: 342 ug/dL (ref 131–425)
Vitamin B-12: 512 pg/mL (ref 232–1245)

## 2019-09-06 LAB — LIPID PANEL WITH LDL/HDL RATIO
Cholesterol, Total: 169 mg/dL (ref 100–199)
HDL: 40 mg/dL (ref >39)
LDL Chol Calc (NIH): 97 mg/dL (ref 0–99)
LDL/HDL Ratio: 2.4 ratio (ref 0.0–3.2)
Triglycerides: 185 mg/dL — ABNORMAL HIGH (ref 0–149)
VLDL Cholesterol Cal: 32 mg/dL (ref 5–40)

## 2019-09-06 LAB — COMPREHENSIVE METABOLIC PANEL
ALT: 7 IU/L (ref 0–32)
AST: 8 IU/L (ref 0–40)
Albumin/Globulin Ratio: 1.8 (ref 1.2–2.2)
Albumin: 3.9 g/dL (ref 3.8–4.8)
Alkaline Phosphatase: 75 IU/L (ref 39–117)
BUN/Creatinine Ratio: 9 (ref 9–23)
BUN: 7 mg/dL (ref 6–24)
Bilirubin Total: <0.2 mg/dL (ref 0.0–1.2)
CO2: 23 mmol/L (ref 20–29)
Calcium: 9 mg/dL (ref 8.7–10.2)
Chloride: 105 mmol/L (ref 96–106)
Creatinine, Ser: 0.81 mg/dL (ref 0.57–1.00)
GFR calc Af Amer: 104 mL/min/{1.73_m2} (ref >59)
GFR calc non Af Amer: 90 mL/min/{1.73_m2} (ref >59)
Globulin, Total: 2.2 g/dL (ref 1.5–4.5)
Glucose: 82 mg/dL (ref 65–99)
Potassium: 4.6 mmol/L (ref 3.5–5.2)
Sodium: 140 mmol/L (ref 134–144)
Total Protein: 6.1 g/dL (ref 6.0–8.5)

## 2019-09-06 LAB — HEMOGLOBIN A1C
Est. average glucose Bld gHb Est-mCnc: 94 mg/dL
Hgb A1c MFr Bld: 4.9 % (ref 4.8–5.6)

## 2019-09-06 LAB — VITAMIN D 25 HYDROXY (VIT D DEFICIENCY, FRACTURES): Vit D, 25-Hydroxy: 51 ng/mL (ref 30.0–100.0)

## 2019-09-06 LAB — INSULIN, RANDOM: INSULIN: 13.1 u[IU]/mL (ref 2.6–24.9)

## 2019-09-06 NOTE — Progress Notes (Signed)
Office: [phone]  /  Fax: [phone]   HPI:   Chief Complaint: OBESITY Heidi Robinson is here to discuss her progress with her obesity treatment plan. She is on the Category 2 plan and is following her eating plan approximately 5 % of the time. She states she is exercising 0 minutes 0 times per week. Heidi Robinson has done well with weight loss, but stress has increased at work and she is sometimes skipping meals. She is struggling with meal planning and eating out more.  Her weight is 231 lb (104.8 kg) today and has had a weight loss of 3 pounds over a period of 7 weeks since her last visit. She has lost 27 lbs since starting treatment with [place].  Pre-Diabetes Heidi Robinson has a diagnosis of pre-diabetes. Her level is well controlled on metformin. Last A1c is stable at 4.9. She denies hypoglycemia. She continues to work on diet and exercise to decrease risk of diabetes.   At risk for diabetes Heidi Robinson is at higher than average risk for developing diabetes due to her obesity and pre-diabetes. She currently denies polyuria or polydipsia.  Vitamin D Deficiency Heidi Robinson has a diagnosis of vitamin D deficiency. Her level is now at goal on Vit D prescription. She denies nausea, vomiting or muscle weakness.  Iron Deficiency Anemia Heidi Robinson has a diagnosis of anemia. She is followed by OBGYN due to menorrhagia. She sometimes skips doses due to nausea.   Depression with Emotional Eating Behaviors Heidi Robinson's mood is stable on Wellbutrin. She denies insomnia and her blood pressure is stable. Heidi Robinson struggles with emotional eating and using food for comfort to the extent that it is negatively impacting her health. She often snacks when she is not hungry. Heidi Robinson sometimes feels she is out of control and then feels guilty that she made poor food choices. She has been working on behavior modification techniques to help reduce her emotional eating and has been somewhat successful. She shows no sign of suicidal or homicidal ideations.   Depression screen [facility] 2/9 11/19/2018 08/06/2018 12/21/2017 11/06/2017 08/04/2017  Decreased Interest 0 0 3 0 0  Down, Depressed, Hopeless 0 0 3 0 0  PHQ - 2 Score 0 0 6 0 0  Altered sleeping - - 2 - -  Tired, decreased energy - - 3 - -  Change in appetite - - 2 - -  Feeling bad or failure about yourself  - - 2 - -  Trouble concentrating - - 3 - -  Moving slowly or fidgety/restless - - 2 - -  Suicidal thoughts - - 0 - -  PHQ-9 Score - - 20 - -  Difficult doing work/chores - - Somewhat difficult - -    ASSESSMENT AND PLAN:  Prediabetes - Plan: metFORMIN (GLUCOPHAGE) 500 MG tablet  Vitamin D deficiency - Plan: Vitamin D, Ergocalciferol, (DRISDOL) 1.25 MG (50000 UT) CAPS capsule  Other iron deficiency anemia - Plan: ferrous sulfate 325 (65 FE) MG tablet  Other depression - with emotional eating - Plan: buPROPion (WELLBUTRIN SR) 200 MG 12 hr tablet  At risk for diabetes mellitus  Class 3 severe obesity with serious comorbidity and body mass index (BMI) of 40.0 to 44.9 in adult, unspecified obesity type (HCC)  PLAN:  Pre-Diabetes Heidi Robinson will continue to work on weight loss, exercise, and decreasing simple carbohydrates in her diet to help decrease the risk of diabetes. We dicussed metformin including benefits and risks. She was informed that eating too many simple carbohydrates or too many calories at  one sitting increases the likelihood of GI side effects. Mikel agrees to continue taking metformin 500 mg PO TID #90 and we will refill for 1 month. Gwenevere agrees to follow up with our clinic in 2 to 3 weeks as directed to monitor her progress.  Diabetes risk counseling Heidi Robinson was given extended (15 minutes) diabetes prevention counseling today. She is 43 y.o. female and has risk factors for diabetes including obesity and pre-diabetes. We discussed intensive lifestyle modifications today with an emphasis on weight loss as well as increasing exercise and decreasing simple carbohydrates in her diet.   Vitamin D Deficiency Heidi Robinson was informed that low vitamin D levels contributes to fatigue and are associated with obesity, breast, and colon cancer. Ima agrees to continue taking prescription Vit D 50,000 IU every week #4 and we will refill for 1 month. She will follow up for routine testing of vitamin D, at least 2-3 times per year. She was informed of the risk of over-replacement of vitamin D and agrees to not increase her dose unless she discusses this with [place] first. Heidi Robinson agrees to follow up with our clinic in 2 to 3 weeks.  Iron Deficiency Anemia The diagnosis of Iron deficiency anemia was discussed with Heidi Robinson and was explained in detail. Leyton agrees to continue taking ferrous sulfate 325 mg PO q AM #30 and we will refill for 1 month. She is discuss iron infusions with her OBGYN. Heidi Robinson agrees to follow up with our clinic in 2 to 3 weeks.  Depression with Emotional Eating Behaviors We discussed behavior modification techniques today to help Heidi Robinson deal with her emotional eating and depression. Heidi Robinson agrees to continue taking Wellbutrin SR 200 mg PO BID #60 and we will refill for 1 month. Heidi Robinson agrees to follow up with our clinic in 2 to 3 weeks.  Obesity Heidi Robinson is currently in the action stage of change. As such, her goal is to continue with weight loss efforts She has agreed to follow the Category 2 plan  Fast food better option handout was given. Heidi Robinson has been instructed to work up to a goal of 150 minutes of combined cardio and strengthening exercise per week for weight loss and overall health benefits. We discussed the following Behavioral Modification Strategies today: increasing lean protein intake, decreasing simple carbohydrates  and emotional eating strategies   Heidi Robinson has agreed to follow up with our clinic in 2 to 3 weeks. She was informed of the importance of frequent follow up visits to maximize her success with intensive lifestyle modifications for her multiple health conditions.   ALLERGIES: Allergies  Allergen Reactions  . Penicillins Anaphylaxis  . Food     Fish, Walnuts, Cashews, Almonds, Pecans, Blueberries, [place] nuts, Pistachios, Macadamia Nuts, Coconuts, Chestnuts  . Latex     "Rash, hives maybe... I don't really remember"    MEDICATIONS: Current Outpatient Medications on File Prior to Visit  Medication Sig Dispense Refill  . albuterol (PROVENTIL) (2.5 MG/3ML) 0.083% nebulizer solution Inhale 2.5 mg into the lungs every 6 (six) hours as needed for wheezing or shortness of breath.    . meloxicam (MOBIC) 15 MG tablet Take 1 tablet (15 mg total) by mouth daily as needed for pain. 30 tablet 5  . Multiple Vitamin (MULTIVITAMINS PO) Take by mouth.    . polyethylene glycol powder (GLYCOLAX/MIRALAX) 17 GM/SCOOP powder Take 17 g by mouth daily. 3350 g 0   No current facility-administered medications on file prior to visit.     PAST MEDICAL HISTORY:  Past Medical History:  Diagnosis Date  . Allergy   . Anemia   . Arthritis   . Asthma   . Fibroids   . Hypertension   . Iron deficiency anemia due to chronic blood loss   . Obesity (BMI 30-39.9) 02/02/2012  . Sickle cell trait ([place]) 06/04/2014   hemoglobin electrophoresis confirmed    PAST SURGICAL HISTORY: Past Surgical History:  Procedure Laterality Date  . BREAST REDUCTION SURGERY Bilateral 11/03/2018   Procedure: BILATERAL BREAST REDUCTION WITH LIPOSUCTION;  Surgeon: Wallace Going, DO;  Location: [place];  Service: Plastics;  Laterality: Bilateral;  . tonsillecomy    . TONSILLECTOMY  1997    SOCIAL HISTORY: Social History   Tobacco Use  . Smoking status: Former Smoker    Years: 2.00    Types: Cigarettes    Quit date: 07/11/2003    Years since quitting: 16.1  . Smokeless tobacco: Never Used  Substance Use Topics  . Alcohol use: No    Alcohol/week: 0.0 standard drinks  . Drug use: No    FAMILY HISTORY: Family History  Problem Relation Age of Onset  . Hypertension  Mother   . Anxiety disorder Mother   . Obesity Mother   . Kidney disease Brother   . Hypertension Maternal Grandmother   . Cancer Maternal Grandmother        bladder  . Hyperlipidemia Father   . Cancer Paternal Grandfather        prostate  . Breast cancer Neg Hx     ROS: Review of Systems  Constitutional: Positive for weight loss.  Gastrointestinal: Negative for nausea and vomiting.  Genitourinary: Negative for frequency.  Musculoskeletal:       Negative muscle weakness  Endo/Heme/Allergies: Negative for polydipsia.       Negative hypoglycemia  Psychiatric/Behavioral: Positive for depression. Negative for suicidal ideas. The patient does not have insomnia.     PHYSICAL EXAM: Blood pressure 121/83, pulse 95, temperature 97.9 F (36.6 C), temperature source Oral, height 5\' 2"  (1.575 m), weight 231 lb (104.8 kg), last menstrual period 08/19/2019, SpO2 100 %. Body mass index is 42.25 kg/m. Physical Exam Vitals signs reviewed.  Constitutional:      Appearance: Normal appearance. She is obese.  Cardiovascular:     Rate and Rhythm: Normal rate.     Pulses: Normal pulses.  Pulmonary:     Effort: Pulmonary effort is normal.     Breath sounds: Normal breath sounds.  Musculoskeletal: Normal range of motion.  Skin:    General: Skin is warm and dry.  Neurological:     Mental Status: She is alert and oriented to person, place, and time.  Psychiatric:        Mood and Affect: Mood normal.        Behavior: Behavior normal.     RECENT LABS AND TESTS: BMET    Component Value Date/Time   NA 140 09/01/2019 0902   K 4.6 09/01/2019 0902   CL 105 09/01/2019 0902   CO2 23 09/01/2019 0902   GLUCOSE 82 09/01/2019 0902   GLUCOSE 83 10/31/2018 1445   BUN 7 09/01/2019 0902   CREATININE 0.81 09/01/2019 0902   CREATININE 0.80 07/24/2016 1238   CALCIUM 9.0 09/01/2019 0902   GFRNONAA 90 09/01/2019 0902   GFRAA 104 09/01/2019 0902   Lab Results  Component Value Date   HGBA1C 4.9  09/01/2019   HGBA1C 5.2 05/11/2018   HGBA1C 5.7 (H) 12/21/2017   HGBA1C 5.2 08/04/2017  HGBA1C 5.3 07/24/2016   Lab Results  Component Value Date   INSULIN 13.1 09/01/2019   INSULIN 16.7 05/11/2018   INSULIN 23.4 12/21/2017   CBC    Component Value Date/Time   WBC 12.4 (H) 09/01/2019 0902   WBC 11.5 (H) 07/24/2016 1238   RBC 4.68 09/01/2019 0902   RBC 5.42 (H) 07/24/2016 1238   HGB 10.3 (L) 09/01/2019 0902   HCT 32.5 (L) 09/01/2019 0902   PLT 496 (H) 11/19/2018 1155   MCV 69 (L) 09/01/2019 0902   MCH 22.0 (L) 09/01/2019 0902   MCH 23.1 (L) 07/24/2016 1238   MCHC 31.7 09/01/2019 0902   MCHC 33.6 07/24/2016 1238   RDW 14.7 09/01/2019 0902   LYMPHSABS 1.9 09/01/2019 0902   MONOABS 0.5 06/04/2014 0828   EOSABS 0.4 09/01/2019 0902   BASOSABS 0.0 09/01/2019 0902   Iron/TIBC/Ferritin/ %Sat    Component Value Date/Time   IRON 25 (L) 09/01/2019 0902   TIBC 367 09/01/2019 0902   FERRITIN 13 (L) 09/01/2019 0902   IRONPCTSAT 7 (LL) 09/01/2019 0902   Lipid Panel     Component Value Date/Time   CHOL 169 09/01/2019 0902   TRIG 185 (H) 09/01/2019 0902   HDL 40 09/01/2019 0902   CHOLHDL 3.8 08/04/2017 1006   CHOLHDL 3.6 07/24/2016 1238   VLDL 27 07/24/2016 1238   LDLCALC 97 09/01/2019 0902   Hepatic Function Panel     Component Value Date/Time   PROT 6.1 09/01/2019 0902   ALBUMIN 3.9 09/01/2019 0902   AST 8 09/01/2019 0902   ALT 7 09/01/2019 0902   ALKPHOS 75 09/01/2019 0902   BILITOT <0.2 09/01/2019 0902      Component Value Date/Time   TSH 1.060 11/19/2018 1155   TSH 1.270 12/21/2017 0959   TSH 1.140 08/04/2017 1006      OBESITY BEHAVIORAL INTERVENTION VISIT  Today's visit was # 49   Starting weight: 258 lbs Starting date: 12/21/17 Today's weight : 231 lbs  Today's date: 09/05/2019 Total lbs lost to date: 49    ASK: We discussed the diagnosis of obesity with Heidi Robinson today and Heidi Robinson agreed to give [place] permission to discuss obesity behavioral  modification therapy today.  ASSESS: Heidi Robinson has the diagnosis of obesity and her BMI today is 42.24 Heidi Robinson is in the action stage of change   ADVISE: Heidi Robinson was educated on the multiple health risks of obesity as well as the benefit of weight loss to improve her health. She was advised of the need for long term treatment and the importance of lifestyle modifications to improve her current health and to decrease her risk of future health problems.  AGREE: Multiple dietary modification options and treatment options were discussed and  Eldonna agreed to follow the recommendations documented in the above note.  ARRANGE: Chasidi was educated on the importance of frequent visits to treat obesity as outlined per CMS and USPSTF guidelines and agreed to schedule her next follow up appointment today.  I, Heidi Robinson Dredge, am acting as transcriptionist for Dennard Nip, MD  I have reviewed the above documentation for accuracy and completeness, and I agree with the above. -Dennard Nip, MD

## 2019-09-11 ENCOUNTER — Telehealth (INDEPENDENT_AMBULATORY_CARE_PROVIDER_SITE_OTHER): Payer: 59 | Admitting: Family Medicine

## 2019-09-11 ENCOUNTER — Other Ambulatory Visit: Payer: Self-pay

## 2019-09-11 ENCOUNTER — Encounter: Payer: Self-pay | Admitting: Family Medicine

## 2019-09-11 VITALS — Ht 62.5 in

## 2019-09-11 DIAGNOSIS — M2550 Pain in unspecified joint: Secondary | ICD-10-CM

## 2019-09-11 DIAGNOSIS — I10 Essential (primary) hypertension: Secondary | ICD-10-CM | POA: Diagnosis not present

## 2019-09-11 MED ORDER — LISINOPRIL-HYDROCHLOROTHIAZIDE 10-12.5 MG PO TABS: 1.0000 | ORAL_TABLET | Freq: Every day | ORAL | 1 refills | Status: DC

## 2019-09-11 MED ORDER — MELOXICAM 15 MG PO TABS: 15.0000 mg | ORAL_TABLET | Freq: Every day | ORAL | 5 refills | Status: DC | PRN

## 2019-09-11 NOTE — Progress Notes (Signed)
Telemedicine Encounter- SOAP NOTE Established Patient  This telephone encounter was conducted with the patient's (or proxy's) verbal consent via audio telecommunications: yes/no: Yes Patient was instructed to have this encounter in a suitably private space; and to only have persons present to whom they give permission to participate. In addition, patient identity was confirmed by use of name plus two identifiers (DOB and address).  I discussed the limitations, risks, security and privacy concerns of performing an evaluation and management service by telephone and the availability of in person appointments. I also discussed with the patient that there may be a patient responsible charge related to this service. The patient expressed understanding and agreed to proceed.  I spent a total of TIME; 0 MIN TO 60 MIN: 15 minutes talking with the patient or their proxy.  CC: med refill, hypertension  Subjective   Heidi Robinson is a 43 y.o. established patient. Telephone visit today for  HPI   Hypertension: Patient here for follow-up of elevated blood pressure. She is exercising and is adherent to low salt diet.  Blood pressure is well controlled at home. Cardiac symptoms none. Patient denies chest pain, claudication, exertional chest pressure/discomfort, fatigue, irregular heart beat, near-syncope and orthopnea.  Cardiovascular risk factors: hypertension. Use of agents associated with hypertension: none. She needs a refill of lisinopril.   Lab Results  Component Value Date   CREATININE 0.81 09/01/2019   BP Readings from Last 3 Encounters:  09/05/19 121/83  07/18/19 107/72  05/29/19 113/72   Joint Pains She is taking her meloxicam as needed for joint pains Lately she has not had any joint pains but needs a refill for PRN pain  Patient Active Problem List   Diagnosis Date Noted  . Depression 04/25/2019  . Class 3 severe obesity with serious comorbidity and body mass index (BMI) of 40.0 to  44.9 in adult ([place]) 04/25/2019  . Chronic bilateral thoracic back pain 09/16/2018  . Neck pain 09/16/2018  . Symptomatic mammary hypertrophy 09/16/2018  . Other constipation 01/18/2018  . Vitamin D deficiency 01/04/2018  . Anemia 01/04/2018  . Prediabetes 01/04/2018  . Other fatigue 12/21/2017  . Shortness of breath on exertion 12/21/2017  . Essential hypertension 12/21/2017  . Cough 11/06/2017  . Mild intermittent asthma without complication [id number]  . Lower resp. tract infection 11/06/2017  . Iron deficiency anemia due to chronic blood loss 08/03/2017  . Pure hypercholesterolemia 08/03/2017  . Sickle cell trait ([place]) 06/12/2014  . Obesity (BMI 35.0-39.9 without comorbidity) 05/23/2013  . HTN (hypertension) 02/02/2012    Past Medical History:  Diagnosis Date  . Allergy   . Anemia   . Arthritis   . Asthma   . Fibroids   . Hypertension   . Iron deficiency anemia due to chronic blood loss   . Obesity (BMI 30-39.9) 02/02/2012  . Sickle cell trait ([place]) 06/04/2014   hemoglobin electrophoresis confirmed    Current Outpatient Medications  Medication Sig Dispense Refill  . albuterol (PROVENTIL) (2.5 MG/3ML) 0.083% nebulizer solution Inhale 2.5 mg into the lungs every 6 (six) hours as needed for wheezing or shortness of breath.    [name] buPROPion (WELLBUTRIN SR) 200 MG 12 hr tablet Take 1 tablet (200 mg total) by mouth 2 (two) times daily. 60 tablet 0  . ferrous sulfate 325 (65 FE) MG tablet Take 1 tablet (325 mg total) by mouth daily with breakfast. 30 tablet 0  . lisinopril-hydrochlorothiazide (ZESTORETIC) 10-12.5 MG tablet Take 1 tablet by mouth daily. Take  1 tablet by mouth daily.  Office visit needed for further refills. 90 tablet 1  . meloxicam (MOBIC) 15 MG tablet Take 1 tablet (15 mg total) by mouth daily as needed for pain. 30 tablet 5  . metFORMIN (GLUCOPHAGE) 500 MG tablet Take 1 tablet (500 mg total) by mouth 3 (three) times daily. 90 tablet 0  . Multiple Vitamin  (MULTIVITAMINS PO) Take by mouth.    . polyethylene glycol powder (GLYCOLAX/MIRALAX) 17 GM/SCOOP powder Take 17 g by mouth daily. 3350 g 0  . Vitamin D, Ergocalciferol, (DRISDOL) 1.25 MG (50000 UT) CAPS capsule Take 1 capsule (50,000 Units total) by mouth every 7 (seven) days. 4 capsule 0   No current facility-administered medications for this visit.     Allergies  Allergen Reactions  . Penicillins Anaphylaxis  . Food     Fish, Walnuts, Cashews, Almonds, Pecans, Blueberries, [place] nuts, Pistachios, Macadamia Nuts, Coconuts, Chestnuts  . Latex     "Rash, hives maybe... I don't really remember"    Social History   Socioeconomic History  . Marital status: Married    Spouse name: Marya Amsler  . Number of children: Not on file  . Years of education: Not on file  . Highest education level: Not on file  Occupational History  . Occupation: Energy manager   Social Needs  . Financial resource strain: Not on file  . Food insecurity    Worry: Not on file    Inability: Not on file  . Transportation needs    Medical: Not on file    Non-medical: Not on file  Tobacco Use  . Smoking status: Former Smoker    Years: 2.00    Types: Cigarettes    Quit date: 07/11/2003    Years since quitting: 16.1  . Smokeless tobacco: Never Used  Substance and Sexual Activity  . Alcohol use: No    Alcohol/week: 0.0 standard drinks  . Drug use: No  . Sexual activity: Yes    Partners: Male  Lifestyle  . Physical activity    Days per week: Not on file    Minutes per session: Not on file  . Stress: Not on file  Relationships  . Social Herbalist on phone: Not on file    Gets together: Not on file    Attends religious service: Not on file    Active member of club or organization: Not on file    Attends meetings of clubs or organizations: Not on file    Relationship status: Not on file  . Intimate partner violence    Fear of current or ex partner: Not on file    Emotionally abused: Not on  file    Physically abused: Not on file    Forced sexual activity: Not on file  Other Topics Concern  . Not on file  Social History Narrative   Married with 2 children. Patient has a Financial risk analyst. Exercise: Cardio 2-3 times a week for 30 minutes.    ROS Review of Systems  Constitutional: Negative for activity change, appetite change, chills and fever.  HENT: Negative for congestion, nosebleeds, trouble swallowing and voice change.   Respiratory: Negative for cough, shortness of breath and wheezing.   Gastrointestinal: Negative for diarrhea, nausea and vomiting.  Genitourinary: Negative for difficulty urinating, dysuria, flank pain and hematuria.  Musculoskeletal: Negative for back pain, joint swelling and neck pain.  Neurological: Negative for dizziness, speech difficulty, light-headedness and numbness.  See HPI. All other review  of systems negative.   Objective   Vitals as reported by the patient: Today's Vitals   09/11/19 1612  Height: 5' 2.5" (1.588 m)    Diagnoses and all orders for this visit:  Essential hypertension - Patient's blood pressure is at goal of 139/89 or less. Condition is stable. Continue current medications and treatment plan. I recommend that you exercise for 30-45 minutes 5 days a week. I also recommend a balanced diet with fruits and vegetables every day, lean meats, and little fried foods. The DASH diet (you can find this online) is a good example of this.  -     lisinopril-hydrochlorothiazide (ZESTORETIC) 10-12.5 MG tablet; Take 1 tablet by mouth daily. Take 1 tablet by mouth daily.  Office visit needed for further refills.  Polyarthralgia - continue exercise, prn meloxicam  Other orders -     meloxicam (MOBIC) 15 MG tablet; Take 1 tablet (15 mg total) by mouth daily as needed for pain.     I discussed the assessment and treatment plan with the patient. The patient was provided an opportunity to ask questions and all were answered. The patient  agreed with the plan and demonstrated an understanding of the instructions.   The patient was advised to call back or seek an in-person evaluation if the symptoms worsen or if the condition fails to improve as anticipated.  I provided 15 minutes of non-face-to-face time during this encounter.  Forrest Moron, MD  Primary Care at [facility]

## 2019-09-11 NOTE — Patient Instructions (Signed)
° ° ° °  If you have lab work done today you will be contacted with your lab results within the next 2 weeks.  If you have not heard from us then please contact us. The fastest way to get your results is to register for My Chart. ° ° °IF you received an x-ray today, you will receive an invoice from [facility]. Please contact [facility] at [phone] with questions or concerns regarding your invoice.  ° °IF you received labwork today, you will receive an invoice from [facility]. Please contact [facility] at [phone] with questions or concerns regarding your invoice.  ° °Our billing staff will not be able to assist you with questions regarding bills from these companies. ° °You will be contacted with the lab results as soon as they are available. The fastest way to get your results is to activate your My Chart account. Instructions are located on the last page of this paperwork. If you have not heard from us regarding the results in 2 weeks, please contact this office. °  ° ° ° °

## 2019-09-11 NOTE — Progress Notes (Signed)
Medication refill, Lisinopril & Meloxicam  CPE is schedule  Dec

## 2019-09-20 ENCOUNTER — Other Ambulatory Visit: Payer: Self-pay | Admitting: Family Medicine

## 2019-09-20 DIAGNOSIS — Z1231 Encounter for screening mammogram for malignant neoplasm of breast: Secondary | ICD-10-CM

## 2019-09-22 ENCOUNTER — Ambulatory Visit (INDEPENDENT_AMBULATORY_CARE_PROVIDER_SITE_OTHER): Payer: 59 | Admitting: Family Medicine

## 2019-09-22 ENCOUNTER — Other Ambulatory Visit: Payer: Self-pay

## 2019-09-22 DIAGNOSIS — Z23 Encounter for immunization: Secondary | ICD-10-CM | POA: Diagnosis not present

## 2019-09-26 ENCOUNTER — Other Ambulatory Visit: Payer: Self-pay

## 2019-09-26 ENCOUNTER — Encounter (INDEPENDENT_AMBULATORY_CARE_PROVIDER_SITE_OTHER): Payer: Self-pay | Admitting: Family Medicine

## 2019-09-26 ENCOUNTER — Ambulatory Visit (INDEPENDENT_AMBULATORY_CARE_PROVIDER_SITE_OTHER): Payer: 59 | Admitting: Family Medicine

## 2019-09-26 VITALS — BP 110/76 | HR 92 | Temp 97.7°F | Ht 62.0 in | Wt 227.0 lb

## 2019-09-26 DIAGNOSIS — Z6841 Body Mass Index (BMI) 40.0 and over, adult: Secondary | ICD-10-CM

## 2019-09-26 DIAGNOSIS — F3289 Other specified depressive episodes: Secondary | ICD-10-CM | POA: Diagnosis not present

## 2019-09-26 DIAGNOSIS — Z9189 Other specified personal risk factors, not elsewhere classified: Secondary | ICD-10-CM | POA: Diagnosis not present

## 2019-09-26 DIAGNOSIS — E8881 Metabolic syndrome: Secondary | ICD-10-CM | POA: Diagnosis not present

## 2019-09-26 DIAGNOSIS — D508 Other iron deficiency anemias: Secondary | ICD-10-CM | POA: Diagnosis not present

## 2019-09-26 DIAGNOSIS — E559 Vitamin D deficiency, unspecified: Secondary | ICD-10-CM | POA: Diagnosis not present

## 2019-09-26 MED ORDER — VITAMIN D (ERGOCALCIFEROL) 1.25 MG (50000 UNIT) PO CAPS: 50000.0000 [IU] | ORAL_CAPSULE | ORAL | 0 refills | Status: DC

## 2019-09-26 MED ORDER — METFORMIN HCL 500 MG PO TABS: 500.0000 mg | ORAL_TABLET | Freq: Three times a day (TID) | ORAL | 0 refills | Status: DC

## 2019-09-26 MED ORDER — BUPROPION HCL ER (SR) 200 MG PO TB12: 200.0000 mg | ORAL_TABLET | Freq: Two times a day (BID) | ORAL | 0 refills | Status: DC

## 2019-09-26 MED ORDER — FERROUS SULFATE 325 (65 FE) MG PO TABS: 325.0000 mg | ORAL_TABLET | Freq: Every day | ORAL | 0 refills | Status: DC

## 2019-09-27 NOTE — Progress Notes (Signed)
Office: [phone]  /  Fax: [phone]   HPI:   Chief Complaint: OBESITY Heidi Robinson is here to discuss her progress with her obesity treatment plan. She is on the Category 2 plan and is following her eating plan approximately 25% of the time. She states she is exercising 0 minutes 0 times per week. Heidi Robinson continues to do well with weight loss. Hunger is controlled, but she still struggles with some sugar cravings. She is uncertain of her Thanksgiving plans.  Her weight is 227 lb (103 kg) today and has had a weight loss of 4 pounds over a period of 3 weeks since her last visit. She has lost 31 lbs since starting treatment with [place].  Insulin Resistance Heidi Robinson has a diagnosis of insulin resistance based on her elevated fasting insulin level >5. She still struggles with increased sugar intake. Although Heidi Robinson's blood glucose readings are still under good control, insulin resistance puts her at greater risk of metabolic syndrome and diabetes. She is doing well on diet and is tolerating metformin well. She continues to work on diet and exercise to decrease risk of diabetes.  At risk for cardiovascular disease Heidi Robinson is at a higher than average risk for cardiovascular disease due to obesity. She currently denies any chest pain.  Iron Deficiency  Heidi Robinson is still on FeSO4, still iron deficient but improved. She is considering having an iron infusion but hasn't decided yet.  Depression, Other  Heidi Robinson states her mood is stable on Wellbutrin. She denies insomnia or palpitations. She shows no sign of suicidal or homicidal ideations.  Depression screen Heidi Robinson 2/9 09/11/2019 11/19/2018 08/06/2018 12/21/2017 11/06/2017  Decreased Interest 0 0 0 3 0  Down, Depressed, Hopeless 0 0 0 3 0  PHQ - 2 Score 0 0 0 6 0  Altered sleeping - - - 2 -  Tired, decreased energy - - - 3 -  Change in appetite - - - 2 -  Feeling bad or failure about yourself  - - - 2 -  Trouble concentrating - - - 3 -  Moving slowly or fidgety/restless -  - - 2 -  Suicidal thoughts - - - 0 -  PHQ-9 Score - - - 20 -  Difficult doing work/chores - - - Somewhat difficult -   Vitamin D deficiency Heidi Robinson has a diagnosis of Vitamin D deficiency. She is currently doing well on prescription Vit D and denies nausea, vomiting or muscle weakness.  ASSESSMENT AND PLAN:  Insulin resistance - Plan: metFORMIN (GLUCOPHAGE) 500 MG tablet  Other iron deficiency anemia - Plan: ferrous sulfate 325 (65 FE) MG tablet  Vitamin D deficiency - Plan: Vitamin D, Ergocalciferol, (DRISDOL) 1.25 MG (50000 UT) CAPS capsule  Other depression - with emotional eating - Plan: buPROPion (WELLBUTRIN SR) 200 MG 12 hr tablet  At risk for heart disease  Class 3 severe obesity with serious comorbidity and body mass index (BMI) of 40.0 to 44.9 in adult, unspecified obesity type (HCC)  PLAN:  Insulin Resistance Heidi Robinson will continue to work on weight loss, exercise, and decreasing simple carbohydrates in her diet to help decrease the risk of diabetes. We dicussed metformin including benefits and risks. She was informed that eating too many simple carbohydrates or too many calories at one sitting increases the likelihood of GI side effects. Heidi Robinson was given a refill on metformin 500 mg TID #90 with 0 refills. She agrees to follow-up with our clinic in 3 weeks.  Cardiovascular risk counseling Heidi Robinson was given extended (15  minutes) coronary artery disease prevention counseling today. She is 43 y.o. female and has risk factors for heart disease including obesity. We discussed intensive lifestyle modifications today with an emphasis on specific weight loss instructions and strategies. Pt was also informed of the importance of increasing exercise and decreasing saturated fats to help prevent heart disease.  Iron Deficiency  Heidi Robinson was given a refill on her FeSO4 325 mg #30 with 0 refills and agrees to follow-up with our clinic in 3 weeks.  Depression, Other  Heidi Robinson was given a refill on her  Wellbutrin 200 mg BID #60 with 0 refills. She agrees to follow-up with our clinic in 3 weeks.  Vitamin D Deficiency Heidi Robinson was informed that low Vitamin D levels contributes to fatigue and are associated with obesity, breast, and colon cancer. She agrees to continue to take prescription Vit D @ 50,000 IU every week #4 with 0 refills and will follow-up for routine testing of Vitamin D, at least 2-3 times per year. She was informed of the risk of over-replacement of Vitamin D and agrees to not increase her dose unless she discusses this with [place] first. Heidi Robinson agrees to follow-up with our clinic in 3 weeks.  Obesity Heidi Robinson is currently in the action stage of change. As such, her goal is to continue with weight loss efforts. She has agreed to follow the Category 2 plan. Heidi Robinson has been instructed to work up to a goal of 150 minutes of combined cardio and strengthening exercise per week for weight loss and overall health benefits. We discussed the following Behavioral Modification Strategies today: increasing lean protein intake, increasing vegetables, and holiday eating strategies.  Heidi Robinson has agreed to follow-up with our clinic in 3 weeks. She was informed of the importance of frequent follow-up visits to maximize her success with intensive lifestyle modifications for her multiple health conditions.  ALLERGIES: Allergies  Allergen Reactions  . Penicillins Anaphylaxis  . Food     Fish, Walnuts, Cashews, Almonds, Pecans, Blueberries, [place] nuts, Pistachios, Macadamia Nuts, Coconuts, Chestnuts  . Latex     "Rash, hives maybe... Heidi Robinson don't really remember"    MEDICATIONS: Current Outpatient Medications on File Prior to Visit  Medication Sig Dispense Refill  . albuterol (PROVENTIL) (2.5 MG/3ML) 0.083% nebulizer solution Inhale 2.5 mg into the lungs every 6 (six) hours as needed for wheezing or shortness of breath.    . lisinopril-hydrochlorothiazide (ZESTORETIC) 10-12.5 MG tablet Take 1 tablet by mouth daily.  Take 1 tablet by mouth daily.  Office visit needed for further refills. 90 tablet 1  . meloxicam (MOBIC) 15 MG tablet Take 1 tablet (15 mg total) by mouth daily as needed for pain. 30 tablet 5  . Multiple Vitamin (MULTIVITAMINS PO) Take by mouth.    . polyethylene glycol powder (GLYCOLAX/MIRALAX) 17 GM/SCOOP powder Take 17 g by mouth daily. 3350 g 0   No current facility-administered medications on file prior to visit.     PAST MEDICAL HISTORY: Past Medical History:  Diagnosis Date  . Allergy   . Anemia   . Arthritis   . Asthma   . Fibroids   . Hypertension   . Iron deficiency anemia due to chronic blood loss   . Obesity (BMI 30-39.9) 02/02/2012  . Sickle cell trait ([place]) 06/04/2014   hemoglobin electrophoresis confirmed    PAST SURGICAL HISTORY: Past Surgical History:  Procedure Laterality Date  . BREAST REDUCTION SURGERY Bilateral 11/03/2018   Procedure: BILATERAL BREAST REDUCTION WITH LIPOSUCTION;  Surgeon: Wallace Going,  DO;  Location: [facility];  Service: Plastics;  Laterality: Bilateral;  . tonsillecomy    . TONSILLECTOMY  1997    SOCIAL HISTORY: Social History   Tobacco Use  . Smoking status: Former Smoker    Years: 2.00    Types: Cigarettes    Quit date: 07/11/2003    Years since quitting: 16.2  . Smokeless tobacco: Never Used  Substance Use Topics  . Alcohol use: No    Alcohol/week: 0.0 standard drinks  . Drug use: No    FAMILY HISTORY: Family History  Problem Relation Age of Onset  . Hypertension Mother   . Anxiety disorder Mother   . Obesity Mother   . Kidney disease Brother   . Hypertension Maternal Grandmother   . Cancer Maternal Grandmother        bladder  . Hyperlipidemia Father   . Cancer Paternal Grandfather        prostate  . Breast cancer Neg Hx    ROS: Review of Systems  Cardiovascular: Negative for palpitations.  Gastrointestinal: Negative for nausea and vomiting.  Musculoskeletal:       Negative for muscle  weakness.  Psychiatric/Behavioral: Positive for depression. Negative for suicidal ideas. The patient does not have insomnia.        Negative for homicidal ideas.   PHYSICAL EXAM: Blood pressure 110/76, pulse 92, temperature 97.7 F (36.5 C), temperature source Oral, height 5\' 2"  (1.575 m), weight 227 lb (103 kg), last menstrual period 09/13/2019, SpO2 97 %. Body mass index is 41.52 kg/m. Physical Exam Vitals signs reviewed.  Constitutional:      Appearance: Normal appearance. She is obese.  Cardiovascular:     Rate and Rhythm: Normal rate.     Pulses: Normal pulses.  Pulmonary:     Effort: Pulmonary effort is normal.     Breath sounds: Normal breath sounds.  Musculoskeletal: Normal range of motion.  Skin:    General: Skin is warm and dry.  Neurological:     Mental Status: She is alert and oriented to person, place, and time.  Psychiatric:        Behavior: Behavior normal.   RECENT LABS AND TESTS: BMET    Component Value Date/Time   NA 140 09/01/2019 0902   K 4.6 09/01/2019 0902   CL 105 09/01/2019 0902   CO2 23 09/01/2019 0902   GLUCOSE 82 09/01/2019 0902   GLUCOSE 83 10/31/2018 1445   BUN 7 09/01/2019 0902   CREATININE 0.81 09/01/2019 0902   CREATININE 0.80 07/24/2016 1238   CALCIUM 9.0 09/01/2019 0902   GFRNONAA 90 09/01/2019 0902   GFRAA 104 09/01/2019 0902   Lab Results  Component Value Date   HGBA1C 4.9 09/01/2019   HGBA1C 5.2 05/11/2018   HGBA1C 5.7 (H) 12/21/2017   HGBA1C 5.2 08/04/2017   HGBA1C 5.3 07/24/2016   Lab Results  Component Value Date   INSULIN 13.1 09/01/2019   INSULIN 16.7 05/11/2018   INSULIN 23.4 12/21/2017   CBC    Component Value Date/Time   WBC 12.4 (H) 09/01/2019 0902   WBC 11.5 (H) 07/24/2016 1238   RBC 4.68 09/01/2019 0902   RBC 5.42 (H) 07/24/2016 1238   HGB 10.3 (L) 09/01/2019 0902   HCT 32.5 (L) 09/01/2019 0902   PLT 496 (H) 11/19/2018 1155   MCV 69 (L) 09/01/2019 0902   MCH 22.0 (L) 09/01/2019 0902   MCH 23.1 (L)  07/24/2016 1238   MCHC 31.7 09/01/2019 0902   MCHC 33.6  07/24/2016 1238   RDW 14.7 09/01/2019 0902   LYMPHSABS 1.9 09/01/2019 0902   MONOABS 0.5 06/04/2014 0828   EOSABS 0.4 09/01/2019 0902   BASOSABS 0.0 09/01/2019 0902   Iron/TIBC/Ferritin/ %Sat    Component Value Date/Time   IRON 25 (L) 09/01/2019 0902   TIBC 367 09/01/2019 0902   FERRITIN 13 (L) 09/01/2019 0902   IRONPCTSAT 7 (LL) 09/01/2019 0902   Lipid Panel     Component Value Date/Time   CHOL 169 09/01/2019 0902   TRIG 185 (H) 09/01/2019 0902   HDL 40 09/01/2019 0902   CHOLHDL 3.8 08/04/2017 1006   CHOLHDL 3.6 07/24/2016 1238   VLDL 27 07/24/2016 1238   LDLCALC 97 09/01/2019 0902   Hepatic Function Panel     Component Value Date/Time   PROT 6.1 09/01/2019 0902   ALBUMIN 3.9 09/01/2019 0902   AST 8 09/01/2019 0902   ALT 7 09/01/2019 0902   ALKPHOS 75 09/01/2019 0902   BILITOT <0.2 09/01/2019 0902      Component Value Date/Time   TSH 1.060 11/19/2018 1155   TSH 1.270 12/21/2017 0959   TSH 1.140 08/04/2017 1006   Results for KIARALEE, VALDIVIEZO (MRN [id number]) as of 09/27/2019 12:03  Ref. Range 09/01/2019 09:02  Vitamin D, 25-Hydroxy Latest Ref Range: 30.0 - 100.0 ng/mL 51.0   OBESITY BEHAVIORAL INTERVENTION VISIT  Today's visit was #30   Starting weight: 258 lbs Starting date: 12/21/2017 Today's weight: 227 lbs  Today's date: 09/26/2019 Total lbs lost to date: 31     09/26/2019  Height 5\' 2"  (1.575 m)  Weight 227 lb (103 kg)  BMI (Calculated) 41.51  BLOOD PRESSURE - SYSTOLIC [id number]  BLOOD PRESSURE - DIASTOLIC 76   Body Fat % 47 %  Total Body Water (lbs) 84.2 lbs   ASK: We discussed the diagnosis of obesity with Cristie Hem today and Thula agreed to give [place] permission to discuss obesity behavioral modification therapy today.  ASSESS: Brieona has the diagnosis of obesity and her BMI today is 41.7. Lajada is in the action stage of change.   ADVISE: Jamie was educated on the multiple health risks of obesity  as well as the benefit of weight loss to improve her health. She was advised of the need for long term treatment and the importance of lifestyle modifications to improve her current health and to decrease her risk of future health problems.  AGREE: Multiple dietary modification options and treatment options were discussed and  Lannie agreed to follow the recommendations documented in the above note.  ARRANGE: Shoko was educated on the importance of frequent visits to treat obesity as outlined per CMS and USPSTF guidelines and agreed to schedule her next follow up appointment today.  Heidi Robinson, Heidi Robinson, am acting as Location manager for Dennard Nip, MD  Heidi Robinson have reviewed the above documentation for accuracy and completeness, and Heidi Robinson agree with the above. -Dennard Nip, MD

## 2019-10-16 ENCOUNTER — Encounter: Payer: 59 | Admitting: Family Medicine

## 2019-10-19 ENCOUNTER — Encounter (INDEPENDENT_AMBULATORY_CARE_PROVIDER_SITE_OTHER): Payer: Self-pay | Admitting: Family Medicine

## 2019-10-19 ENCOUNTER — Other Ambulatory Visit: Payer: Self-pay

## 2019-10-19 ENCOUNTER — Ambulatory Visit (INDEPENDENT_AMBULATORY_CARE_PROVIDER_SITE_OTHER): Payer: No Typology Code available for payment source | Admitting: Family Medicine

## 2019-10-19 VITALS — BP 130/85 | HR 105 | Temp 98.3°F | Ht 62.0 in | Wt 227.0 lb

## 2019-10-19 DIAGNOSIS — E559 Vitamin D deficiency, unspecified: Secondary | ICD-10-CM

## 2019-10-19 DIAGNOSIS — F3289 Other specified depressive episodes: Secondary | ICD-10-CM

## 2019-10-19 DIAGNOSIS — R7303 Prediabetes: Secondary | ICD-10-CM

## 2019-10-19 DIAGNOSIS — Z6841 Body Mass Index (BMI) 40.0 and over, adult: Secondary | ICD-10-CM

## 2019-10-19 DIAGNOSIS — K5909 Other constipation: Secondary | ICD-10-CM

## 2019-10-19 DIAGNOSIS — Z9189 Other specified personal risk factors, not elsewhere classified: Secondary | ICD-10-CM

## 2019-10-19 DIAGNOSIS — D508 Other iron deficiency anemias: Secondary | ICD-10-CM | POA: Diagnosis not present

## 2019-10-19 MED ORDER — POLYETHYLENE GLYCOL 3350 17 GM/SCOOP PO POWD: 17.0000 g | Freq: Every day | ORAL | 0 refills | Status: DC

## 2019-10-19 MED ORDER — VITAMIN D (ERGOCALCIFEROL) 1.25 MG (50000 UNIT) PO CAPS: 50000.0000 [IU] | ORAL_CAPSULE | ORAL | 0 refills | Status: DC

## 2019-10-19 MED ORDER — FERROUS SULFATE 325 (65 FE) MG PO TABS: 325.0000 mg | ORAL_TABLET | Freq: Every day | ORAL | 0 refills | Status: DC

## 2019-10-19 MED ORDER — BUPROPION HCL ER (SR) 200 MG PO TB12: 200.0000 mg | ORAL_TABLET | Freq: Two times a day (BID) | ORAL | 0 refills | Status: DC

## 2019-10-19 MED ORDER — METFORMIN HCL 500 MG PO TABS: 500.0000 mg | ORAL_TABLET | Freq: Three times a day (TID) | ORAL | 0 refills | Status: DC

## 2019-10-23 ENCOUNTER — Encounter: Payer: Self-pay | Admitting: Family Medicine

## 2019-10-23 ENCOUNTER — Ambulatory Visit
Admission: RE | Admit: 2019-10-23 | Discharge: 2019-10-23 | Disposition: A | Discharge status: Home / Self Care | Payer: No Typology Code available for payment source | Source: Ambulatory Visit | Attending: Family Medicine | Admitting: Family Medicine

## 2019-10-23 ENCOUNTER — Other Ambulatory Visit: Payer: Self-pay

## 2019-10-23 ENCOUNTER — Ambulatory Visit (INDEPENDENT_AMBULATORY_CARE_PROVIDER_SITE_OTHER): Payer: No Typology Code available for payment source | Admitting: Family Medicine

## 2019-10-23 VITALS — BP 114/81 | HR 82 | Temp 97.9°F | Resp 17 | Ht 62.0 in | Wt 234.2 lb

## 2019-10-23 DIAGNOSIS — Z0001 Encounter for general adult medical examination with abnormal findings: Secondary | ICD-10-CM | POA: Diagnosis not present

## 2019-10-23 DIAGNOSIS — R7303 Prediabetes: Secondary | ICD-10-CM

## 2019-10-23 DIAGNOSIS — Z760 Encounter for issue of repeat prescription: Secondary | ICD-10-CM | POA: Diagnosis not present

## 2019-10-23 DIAGNOSIS — I10 Essential (primary) hypertension: Secondary | ICD-10-CM

## 2019-10-23 DIAGNOSIS — D508 Other iron deficiency anemias: Secondary | ICD-10-CM | POA: Diagnosis not present

## 2019-10-23 DIAGNOSIS — Z23 Encounter for immunization: Secondary | ICD-10-CM

## 2019-10-23 DIAGNOSIS — Z0000 Encounter for general adult medical examination without abnormal findings: Secondary | ICD-10-CM

## 2019-10-23 DIAGNOSIS — Z1329 Encounter for screening for other suspected endocrine disorder: Secondary | ICD-10-CM

## 2019-10-23 DIAGNOSIS — Z1231 Encounter for screening mammogram for malignant neoplasm of breast: Secondary | ICD-10-CM

## 2019-10-23 MED ORDER — LISINOPRIL-HYDROCHLOROTHIAZIDE 10-12.5 MG PO TABS: 1.0000 | ORAL_TABLET | Freq: Every day | ORAL | 1 refills | Status: DC

## 2019-10-23 MED ORDER — ALBUTEROL SULFATE HFA 108 (90 BASE) MCG/ACT IN AERS: 2.0000 | INHALATION_SPRAY | Freq: Four times a day (QID) | RESPIRATORY_TRACT | 3 refills | Status: DC | PRN

## 2019-10-23 MED ORDER — MELOXICAM 15 MG PO TABS: 15.0000 mg | ORAL_TABLET | Freq: Every day | ORAL | 5 refills | Status: DC | PRN

## 2019-10-23 NOTE — Progress Notes (Signed)
Chief Complaint  Patient presents with  . Annual Exam    cpe with no pap  . Medication Refill    lisinopril, albuterol and meloxicam    Subjective:  Heidi Robinson is a 43 y.o. female here for a health maintenance visit.  Patient is established pt  Hypertension Hypertension: Patient here for follow-up of elevated blood pressure. She is exercising and is adherent to low salt diet.  Blood pressure is well controlled at home. Cardiac symptoms none. Patient denies chest pain, chest pressure/discomfort, claudication, dyspnea, exertional chest pressure/discomfort, fatigue, irregular heart beat and lower extremity edema.  Cardiovascular risk factors: diabetes mellitus and dyslipidemia. Use of agents associated with hypertension: none. History of target organ damage: none. BP Readings from Last 3 Encounters:  10/23/19 114/81  10/19/19 130/85  09/26/19 110/76   Wt Readings from Last 3 Encounters:  10/23/19 234 lb 3.2 oz (106.2 kg)  10/19/19 227 lb (103 kg)  09/26/19 227 lb (103 kg)     Patient Active Problem List   Diagnosis Date Noted  . Depression 04/25/2019  . Class 3 severe obesity with serious comorbidity and body mass index (BMI) of 40.0 to 44.9 in adult ([place]) 04/25/2019  . Chronic bilateral thoracic back pain 09/16/2018  . Neck pain 09/16/2018  . Symptomatic mammary hypertrophy 09/16/2018  . Other constipation 01/18/2018  . Vitamin D deficiency 01/04/2018  . Anemia 01/04/2018  . Prediabetes 01/04/2018  . Other fatigue 12/21/2017  . Shortness of breath on exertion 12/21/2017  . Essential hypertension 12/21/2017  . Cough 11/06/2017  . Mild intermittent asthma without complication [id number]  . Lower resp. tract infection 11/06/2017  . Iron deficiency anemia due to chronic blood loss 08/03/2017  . Pure hypercholesterolemia 08/03/2017  . Sickle cell trait ([place]) 06/12/2014  . Obesity (BMI 35.0-39.9 without comorbidity) 05/23/2013  . HTN (hypertension) 02/02/2012    Past Medical  History:  Diagnosis Date  . Allergy   . Anemia   . Arthritis   . Asthma   . Fibroids   . Hypertension   . Iron deficiency anemia due to chronic blood loss   . Obesity (BMI 30-39.9) 02/02/2012  . Sickle cell trait ([place]) 06/04/2014   hemoglobin electrophoresis confirmed    Past Surgical History:  Procedure Laterality Date  . BREAST REDUCTION SURGERY Bilateral 11/03/2018   Procedure: BILATERAL BREAST REDUCTION WITH LIPOSUCTION;  Surgeon: Wallace Going, DO;  Location: [place];  Service: Plastics;  Laterality: Bilateral;  . tonsillecomy    . TONSILLECTOMY  1997     Outpatient Medications Prior to Visit  Medication Sig Dispense Refill  . albuterol (PROVENTIL) (2.5 MG/3ML) 0.083% nebulizer solution Inhale 2.5 mg into the lungs every 6 (six) hours as needed for wheezing or shortness of breath.    [name] buPROPion (WELLBUTRIN SR) 200 MG 12 hr tablet Take 1 tablet (200 mg total) by mouth 2 (two) times daily. 60 tablet 0  . ferrous sulfate 325 (65 FE) MG tablet Take 1 tablet (325 mg total) by mouth daily with breakfast. 30 tablet 0  . metFORMIN (GLUCOPHAGE) 500 MG tablet Take 1 tablet (500 mg total) by mouth 3 (three) times daily. 90 tablet 0  . Multiple Vitamin (MULTIVITAMINS PO) Take by mouth.    . polyethylene glycol powder (GLYCOLAX/MIRALAX) 17 GM/SCOOP powder Take 17 g by mouth daily. 3350 g 0  . Vitamin D, Ergocalciferol, (DRISDOL) 1.25 MG (50000 UT) CAPS capsule Take 1 capsule (50,000 Units total) by mouth every 7 (seven) days. 4  capsule 0  . lisinopril-hydrochlorothiazide (ZESTORETIC) 10-12.5 MG tablet Take 1 tablet by mouth daily. Take 1 tablet by mouth daily.  Office visit needed for further refills. 90 tablet 1  . meloxicam (MOBIC) 15 MG tablet Take 1 tablet (15 mg total) by mouth daily as needed for pain. 30 tablet 5   No facility-administered medications prior to visit.    Allergies  Allergen Reactions  . Penicillins Anaphylaxis  . Food     Fish,  Walnuts, Cashews, Almonds, Pecans, Blueberries, [place] nuts, Pistachios, Macadamia Nuts, Coconuts, Chestnuts  . Latex     "Rash, hives maybe... I don't really remember"     Family History  Problem Relation Age of Onset  . Hypertension Mother   . Anxiety disorder Mother   . Obesity Mother   . Kidney disease Brother   . Hypertension Maternal Grandmother   . Cancer Maternal Grandmother        bladder  . Hyperlipidemia Father   . Cancer Paternal Grandfather        prostate  . Breast cancer Neg Hx      Health Habits: Dental Exam: up to date Eye Exam: up to date Exercise:  times/week on average Current exercise activities: walking/running Diet: balanced  Social History   Socioeconomic History  . Marital status: Married    Spouse name: Marya Amsler  . Number of children: Not on file  . Years of education: Not on file  . Highest education level: Not on file  Occupational History  . Occupation: Energy manager   Tobacco Use  . Smoking status: Former Smoker    Years: 2.00    Types: Cigarettes    Quit date: 07/11/2003    Years since quitting: 16.2  . Smokeless tobacco: Never Used  Substance and Sexual Activity  . Alcohol use: No    Alcohol/week: 0.0 standard drinks  . Drug use: No  . Sexual activity: Yes    Partners: Male  Other Topics Concern  . Not on file  Social History Narrative   Married with 2 children. Patient has a Financial risk analyst. Exercise: Cardio 2-3 times a week for 30 minutes.   Social Determinants of Health   Financial Resource Strain:   . Difficulty of Paying Living Expenses: Not on file  Food Insecurity:   . Worried About Charity fundraiser in the Last Year: Not on file  . Ran Out of Food in the Last Year: Not on file  Transportation Needs:   . Lack of Transportation (Medical): Not on file  . Lack of Transportation (Non-Medical): Not on file  Physical Activity:   . Days of Exercise per Week: Not on file  . Minutes of Exercise per Session: Not  on file  Stress:   . Feeling of Stress : Not on file  Social Connections:   . Frequency of Communication with Friends and Family: Not on file  . Frequency of Social Gatherings with Friends and Family: Not on file  . Attends Religious Services: Not on file  . Active Member of Clubs or Organizations: Not on file  . Attends Archivist Meetings: Not on file  . Marital Status: Not on file  Intimate Partner Violence:   . Fear of Current or Ex-Partner: Not on file  . Emotionally Abused: Not on file  . Physically Abused: Not on file  . Sexually Abused: Not on file   Social History   Substance and Sexual Activity  Alcohol Use No  . Alcohol/week: 0.0  standard drinks   Social History   Tobacco Use  Smoking Status Former Smoker  . Years: 2.00  . Types: Cigarettes  . Quit date: 07/11/2003  . Years since quitting: 16.2  Smokeless Tobacco Never Used   Social History   Substance and Sexual Activity  Drug Use No    GYN: Sexual Health Menstrual status: regular menses LMP: Patient's last menstrual period was 10/10/2019 (exact date). Last pap smear: see HM section History of abnormal pap smears:  Sexually active: with female partner Current contraception: none  Health Maintenance: See under health Maintenance activity for review of completion dates as well. Immunization History  Administered Date(s) Administered  . Influenza,inj,Quad PF,6+ Mos 07/31/2015, 07/24/2016, 08/04/2017, 08/06/2018, 09/22/2019  . Pneumococcal Polysaccharide-23 05/19/2013  . Tdap 11/09/2008, 10/23/2019      Depression Screen-PHQ2/9 Depression screen [facility] 2/9 10/23/2019 09/11/2019 11/19/2018 08/06/2018 12/21/2017  Decreased Interest 0 0 0 0 3  Down, Depressed, Hopeless 0 0 0 0 3  PHQ - 2 Score 0 0 0 0 6  Altered sleeping - - - - 2  Tired, decreased energy - - - - 3  Change in appetite - - - - 2  Feeling bad or failure about yourself  - - - - 2  Trouble concentrating - - - - 3  Moving slowly or  fidgety/restless - - - - 2  Suicidal thoughts - - - - 0  PHQ-9 Score - - - - 20  Difficult doing work/chores - - - - Somewhat difficult       Depression Severity and Treatment Recommendations:  0-4= None  5-9= Mild / Treatment: Support, educate to call if worse; return in one month  10-14= Moderate / Treatment: Support, watchful waiting; Antidepressant or Psycotherapy  15-19= Moderately severe / Treatment: Antidepressant OR Psychotherapy  >= 20 = Major depression, severe / Antidepressant AND Psychotherapy    Review of Systems   ROS  See HPI for ROS as well.   Review of Systems  Constitutional: Negative for activity change, appetite change, chills and fever.  HENT: Negative for congestion, nosebleeds, trouble swallowing and voice change.   Respiratory: Negative for cough, shortness of breath and wheezing.   Gastrointestinal: Negative for diarrhea, nausea and vomiting.  Genitourinary: Negative for difficulty urinating, dysuria, flank pain and hematuria.  Musculoskeletal: Negative for back pain, joint swelling and neck pain.  Neurological: Negative for dizziness, speech difficulty, light-headedness and numbness.  See HPI. All other review of systems negative.    Objective:   Vitals:   10/23/19 1038  BP: 114/81  Pulse: 82  Resp: 17  Temp: 97.9 F (36.6 C)  TempSrc: Oral  SpO2: 98%  Weight: 234 lb 3.2 oz (106.2 kg)  Height: 5\' 2"  (1.575 m)    Body mass index is 42.84 kg/m.  Physical Exam Constitutional:      Appearance: Normal appearance.  HENT:     Head: Normocephalic and atraumatic.     Right Ear: Tympanic membrane normal.     Left Ear: Tympanic membrane normal.     Nose: Nose normal. No congestion.  Eyes:     Extraocular Movements: Extraocular movements intact.     Conjunctiva/sclera: Conjunctivae normal.  Cardiovascular:     Rate and Rhythm: Normal rate and regular rhythm.     Pulses: Normal pulses.     Heart sounds: No murmur.  Pulmonary:      Effort: Pulmonary effort is normal. No respiratory distress.     Breath sounds: Normal breath sounds. No  stridor. No wheezing, rhonchi or rales.  Chest:     Chest wall: No tenderness.  Abdominal:     General: Abdomen is flat. Bowel sounds are normal. There is no distension.     Palpations: Abdomen is soft. There is no mass.     Tenderness: There is no abdominal tenderness. There is no right CVA tenderness, left CVA tenderness, guarding or rebound.     Hernia: No hernia is present.  Musculoskeletal:     Cervical back: Normal range of motion and neck supple.  Skin:    General: Skin is warm.     Capillary Refill: Capillary refill takes less than 2 seconds.  Neurological:     General: No focal deficit present.     Mental Status: She is alert and oriented to person, place, and time.  Psychiatric:        Mood and Affect: Mood normal.        Behavior: Behavior normal.        Thought Content: Thought content normal.        Judgment: Judgment normal.        Assessment/Plan:   Patient was seen for a health maintenance exam.  Counseled the patient on health maintenance issues. Reviewed her health mainteance schedule and ordered appropriate tests (see orders.) Counseled on regular exercise and weight management. Recommend regular eye exams and dental cleaning.   The following issues were addressed today for health maintenance:   Kyleen was seen today for annual exam and medication refill.  Diagnoses and all orders for this visit:  Encounter for health maintenance examination in adult-  Women's Health Maintenance Plan Advised monthly breast exam and annual mammogram Advised dental exam every six months Discussed stress management Discussed pap smear screening guidelines.  Continue with Gynecology.   Other iron deficiency anemia- discussed IUD that can stabilize hormones and lead to amenorrhea which can help with anemia.  Also continue with iron supplementation. If hgb less than 10 she  should get an infusion.  -     Cancel: POCT CBC -     CBC  Need for Tdap vaccination -     Tdap vaccine greater than or equal to 7yo IM  Prediabetes- continue with Dr. Leafy Ro -     TSH  Screening for thyroid disorder -     TSH  Need for vaccination  Encounter for medication refill -     albuterol (VENTOLIN HFA) 108 (90 Base) MCG/ACT inhaler; Inhale 2 puffs into the lungs every 6 (six) hours as needed for wheezing or shortness of breath.  Essential hypertension- Patient's blood pressure is at goal of 139/89 or less. Condition is stable. Continue current medications and treatment plan. I recommend that you exercise for 30-45 minutes 5 days a week. I also recommend a balanced diet with fruits and vegetables every day, lean meats, and little fried foods. The DASH diet (you can find this online) is a good example of this.  -     lisinopril-hydrochlorothiazide (ZESTORETIC) 10-12.5 MG tablet; Take 1 tablet by mouth daily. Take 1 tablet by mouth daily.  Need for diphtheria-tetanus-pertussis (Tdap) vaccine  Other orders -     meloxicam (MOBIC) 15 MG tablet; Take 1 tablet (15 mg total) by mouth daily as needed for pain.    No follow-ups on file.    Body mass index is 42.84 kg/m.:  Discussed the patient's BMI with patient. The BMI body mass index is 42.84 kg/m.     Future Appointments  Date Time Provider [place]  10/23/2019  3:40 PM [facility] 3 [facility]  11/16/2019  5:00 PM Starlyn Skeans, MD [facility] None    Patient Instructions       If you have lab work done today you will be contacted with your lab results within the next 2 weeks.  If you have not heard from [place] then please contact us. The fastest way to get your results is to register for My Chart.   IF you received an x-ray today, you will receive an invoice from [facility]. Please contact [facility] at [phone] with questions or concerns regarding your invoice.   IF  you received labwork today, you will receive an invoice from [place]. Please contact [facility] at [phone] with questions or concerns regarding your invoice.   Our billing staff will not be able to assist you with questions regarding bills from these companies.  You will be contacted with the lab results as soon as they are available. The fastest way to get your results is to activate your My Chart account. Instructions are located on the last page of this paperwork. If you have not heard from [place] regarding the results in 2 weeks, please contact this office.

## 2019-10-23 NOTE — Progress Notes (Signed)
Office: [phone]  /  Fax: [phone]   HPI:  Chief Complaint: OBESITY Heidi Robinson is here to discuss her progress with her obesity treatment plan. She is on the Category 2 plan and states she is following her eating plan approximately 30% of the time. She states she is exercising 0 minutes 0 times per week.  Heidi Robinson did well maintaining her weight even over Thanksgiving. She notes her appetite decreased so she didn't feel like eating much, but knows this could have decreased her metabolism.  Today's visit was #31 Starting weight: 258 lbs Starting date: 12/21/2017 Today's weight: 227 lbs  Today's date: 10/19/2019 Total lbs lost to date: 31  Total lbs lost since last in-office visit: 0  Prediabetes Heidi Robinson has a diagnosis of prediabetes and continues to do well with diet. A1c is nicely controlled at 4.9 on 09/01/2019. She reports tolerating metformin well.  Constipation Heidi Robinson has constipation. She is taking MiraLax 1-2 times per week PRN and feels it is helping her constipation.  Iron Deficiency Anemia Heidi Robinson has iron deficiency anemia due to menorrhagia. She reports tolerating Feosol well and requests a refill. She is following with her GYN and considering a hysterectomy.  Vitamin D deficiency Heidi Robinson's last Vitamin D level was at goal at 51.0 on 09/01/2019. She is now maintaining this level.  At risk for cardiovascular disease Heidi Robinson is at a higher than average risk for cardiovascular disease due to obesity. She currently denies any chest pain.  Emotional Eating Heidi Robinson is doing well controlling her emotional eating and tolerating Wellbutrin. Blood pressure is stable.  ASSESSMENT AND PLAN:  Other constipation - Plan: polyethylene glycol powder (GLYCOLAX/MIRALAX) 17 GM/SCOOP powder  Other iron deficiency anemia - Plan: ferrous sulfate 325 (65 FE) MG tablet  Vitamin D deficiency - Plan: Vitamin D, Ergocalciferol, (DRISDOL) 1.25 MG (50000 UT) CAPS capsule  Prediabetes - Plan: metFORMIN  (GLUCOPHAGE) 500 MG tablet  Other depression - with emotional eating - Plan: buPROPion (WELLBUTRIN SR) 200 MG 12 hr tablet  At risk for heart disease  Class 3 severe obesity with serious comorbidity and body mass index (BMI) of 40.0 to 44.9 in adult, unspecified obesity type ([place])  PLAN:  Pre-Diabetes Segen will continue to work on weight loss, exercise, and decreasing simple carbohydrates to help decrease the risk of diabetes. She was given a refill on her metformin 500 mg TID #90 with 0 refills. She agrees to follow-up with our clinic in 4 weeks.  Constipation Darlisa was informed decrease bowel movement frequency is normal while losing weight, but stools should not be hard or painful. She was advised to increase her H20 intake and work on optimizing her fiber intake. Dotsy was given a refill on her MiraLax 3350 g 17 gram scoop daily with 0 refills. She agrees to follow-up with our clinic in 4 weeks.  Iron Deficiency Anemia Raneshia was given a refill on her Feosol 325 mg #30 with 0 refills. She agrees to follow-up with our clinic in 4 weeks.  Vitamin D Deficiency Heidi Robinson was informed that low Vitamin D levels contributes to fatigue and are associated with obesity, breast, and colon cancer. She agrees to continue to take prescription Vit D @ 50,000 IU every week #4 with 0 refills and will follow-up for routine testing of Vitamin D, at least 2-3 times per year. She was informed of the risk of over-replacement of Vitamin D and agrees to not increase her dose unless she discusses this with [place] first. Heidi Robinson agrees to follow-up with our  clinic in 4 weeks.  Cardiovascular risk counseling Heidi Robinson was given (~15 minutes) coronary artery disease prevention counseling today. She is 43 y.o. female and has risk factors for heart disease including obesity. We discussed intensive lifestyle modifications today with an emphasis on specific weight loss instructions and strategies.   Emotional Eating Behaviors (other  depression) Behavior modification techniques were discussed today to help Heidi Robinson deal with her emotional/non-hunger eating behaviors. Heidi Robinson was given a refill on her Wellbutrin 200 mg BID #60 with 0 refills. She agrees to follow-up with our clinic in 4 weeks.  Obesity Heidi Robinson is currently in the action stage of change. As such, her goal is to continue with weight loss efforts She has agreed to follow the Category 2 plan Heidi Robinson has an eventual goal of 150 minutes of combined cardio and strengthening exercise per week for weight loss and overall health benefits. We discussed the following Behavioral Modification Stratagies today: no skipping meals and holiday eating strategies.  Heidi Robinson has agreed to follow-up with our clinic in 4 weeks. She was informed of the importance of frequent follow-up visits to maximize her success with intensive lifestyle modifications for her multiple health conditions.  ALLERGIES: Allergies  Allergen Reactions  . Penicillins Anaphylaxis  . Food     Fish, Walnuts, Cashews, Almonds, Pecans, Blueberries, [place] nuts, Pistachios, Macadamia Nuts, Coconuts, Chestnuts  . Latex     "Rash, hives maybe... I don't really remember"    MEDICATIONS: Current Outpatient Medications on File Prior to Visit  Medication Sig Dispense Refill  . albuterol (PROVENTIL) (2.5 MG/3ML) 0.083% nebulizer solution Inhale 2.5 mg into the lungs every 6 (six) hours as needed for wheezing or shortness of breath.    . Multiple Vitamin (MULTIVITAMINS PO) Take by mouth.     No current facility-administered medications on file prior to visit.    PAST MEDICAL HISTORY: Past Medical History:  Diagnosis Date  . Allergy   . Anemia   . Arthritis   . Asthma   . Fibroids   . Hypertension   . Iron deficiency anemia due to chronic blood loss   . Obesity (BMI 30-39.9) 02/02/2012  . Sickle cell trait ([place]) 06/04/2014   hemoglobin electrophoresis confirmed    PAST SURGICAL HISTORY: Past Surgical History:    Procedure Laterality Date  . BREAST REDUCTION SURGERY Bilateral 11/03/2018   Procedure: BILATERAL BREAST REDUCTION WITH LIPOSUCTION;  Surgeon: Wallace Going, DO;  Location: [place];  Service: Plastics;  Laterality: Bilateral;  . tonsillecomy    . TONSILLECTOMY  1997    SOCIAL HISTORY: Social History   Tobacco Use  . Smoking status: Former Smoker    Years: 2.00    Types: Cigarettes    Quit date: 07/11/2003    Years since quitting: 16.2  . Smokeless tobacco: Never Used  Substance Use Topics  . Alcohol use: No    Alcohol/week: 0.0 standard drinks  . Drug use: No    FAMILY HISTORY: Family History  Problem Relation Age of Onset  . Hypertension Mother   . Anxiety disorder Mother   . Obesity Mother   . Kidney disease Brother   . Hypertension Maternal Grandmother   . Cancer Maternal Grandmother        bladder  . Hyperlipidemia Father   . Cancer Paternal Grandfather        prostate  . Breast cancer Neg Hx    ROS: Review of Systems  Gastrointestinal: Positive for constipation.  Endo/Heme/Allergies:  Positive for iron deficiency anemia.   PHYSICAL EXAM: Blood pressure 130/85, pulse (!) 105, temperature 98.3 F (36.8 C), temperature source Oral, height 5\' 2"  (1.575 m), weight 227 lb (103 kg), last menstrual period 10/10/2019, SpO2 100 %. Body mass index is 41.52 kg/m. Physical Exam Vitals reviewed.  Constitutional:      Appearance: Normal appearance. She is obese.  Cardiovascular:     Rate and Rhythm: Normal rate.     Pulses: Normal pulses.  Pulmonary:     Effort: Pulmonary effort is normal.     Breath sounds: Normal breath sounds.  Musculoskeletal:        General: Normal range of motion.  Skin:    General: Skin is warm and dry.  Neurological:     Mental Status: She is alert and oriented to person, place, and time.  Psychiatric:        Behavior: Behavior normal.   RECENT LABS AND TESTS: BMET    Component Value Date/Time   NA  140 09/01/2019 0902   K 4.6 09/01/2019 0902   CL 105 09/01/2019 0902   CO2 23 09/01/2019 0902   GLUCOSE 82 09/01/2019 0902   GLUCOSE 83 10/31/2018 1445   BUN 7 09/01/2019 0902   CREATININE 0.81 09/01/2019 0902   CREATININE 0.80 07/24/2016 1238   CALCIUM 9.0 09/01/2019 0902   GFRNONAA 90 09/01/2019 0902   GFRAA 104 09/01/2019 0902   Lab Results  Component Value Date   HGBA1C 4.9 09/01/2019   HGBA1C 5.2 05/11/2018   HGBA1C 5.7 (H) 12/21/2017   HGBA1C 5.2 08/04/2017   HGBA1C 5.3 07/24/2016   Lab Results  Component Value Date   INSULIN 13.1 09/01/2019   INSULIN 16.7 05/11/2018   INSULIN 23.4 12/21/2017   CBC    Component Value Date/Time   WBC 12.4 (H) 09/01/2019 0902   WBC 11.5 (H) 07/24/2016 1238   RBC 4.68 09/01/2019 0902   RBC 5.42 (H) 07/24/2016 1238   HGB 10.3 (L) 09/01/2019 0902   HCT 32.5 (L) 09/01/2019 0902   PLT 496 (H) 11/19/2018 1155   MCV 69 (L) 09/01/2019 0902   MCH 22.0 (L) 09/01/2019 0902   MCH 23.1 (L) 07/24/2016 1238   MCHC 31.7 09/01/2019 0902   MCHC 33.6 07/24/2016 1238   RDW 14.7 09/01/2019 0902   LYMPHSABS 1.9 09/01/2019 0902   MONOABS 0.5 06/04/2014 0828   EOSABS 0.4 09/01/2019 0902   BASOSABS 0.0 09/01/2019 0902   Iron/TIBC/Ferritin/ %Sat    Component Value Date/Time   IRON 25 (L) 09/01/2019 0902   TIBC 367 09/01/2019 0902   FERRITIN 13 (L) 09/01/2019 0902   IRONPCTSAT 7 (LL) 09/01/2019 0902   Lipid Panel     Component Value Date/Time   CHOL 169 09/01/2019 0902   TRIG 185 (H) 09/01/2019 0902   HDL 40 09/01/2019 0902   CHOLHDL 3.8 08/04/2017 1006   CHOLHDL 3.6 07/24/2016 1238   VLDL 27 07/24/2016 1238   LDLCALC 97 09/01/2019 0902   Hepatic Function Panel     Component Value Date/Time   PROT 6.1 09/01/2019 0902   ALBUMIN 3.9 09/01/2019 0902   AST 8 09/01/2019 0902   ALT 7 09/01/2019 0902   ALKPHOS 75 09/01/2019 0902   BILITOT <0.2 09/01/2019 0902      Component Value Date/Time   TSH 1.060 11/19/2018 1155   TSH 1.270  12/21/2017 0959   TSH 1.140 08/04/2017 1006      I, Michaelene Song, am acting as Location manager for [employer],  MD I have reviewed the above documentation for accuracy and completeness, and I agree with the above. -Dennard Nip, MD

## 2019-10-23 NOTE — Patient Instructions (Signed)
° ° ° °  If you have lab work done today you will be contacted with your lab results within the next 2 weeks.  If you have not heard from us then please contact us. The fastest way to get your results is to register for My Chart. ° ° °IF you received an x-ray today, you will receive an invoice from [facility]. Please contact [facility] at [phone] with questions or concerns regarding your invoice.  ° °IF you received labwork today, you will receive an invoice from [facility]. Please contact [facility] at [phone] with questions or concerns regarding your invoice.  ° °Our billing staff will not be able to assist you with questions regarding bills from these companies. ° °You will be contacted with the lab results as soon as they are available. The fastest way to get your results is to activate your My Chart account. Instructions are located on the last page of this paperwork. If you have not heard from us regarding the results in 2 weeks, please contact this office. °  ° ° ° °

## 2019-10-24 LAB — CBC
Hematocrit: 33.5 % — ABNORMAL LOW (ref 34.0–46.6)
Hemoglobin: 10.6 g/dL — ABNORMAL LOW (ref 11.1–15.9)
MCH: 21.9 pg — ABNORMAL LOW (ref 26.6–33.0)
MCHC: 31.6 g/dL (ref 31.5–35.7)
MCV: 69 fL — ABNORMAL LOW (ref 79–97)
Platelets: 423 10*3/uL (ref 150–450)
RBC: 4.85 x10E6/uL (ref 3.77–5.28)
RDW: 15 % (ref 11.7–15.4)
WBC: 11.4 10*3/uL — ABNORMAL HIGH (ref 3.4–10.8)

## 2019-10-24 LAB — TSH: TSH: 1.6 u[IU]/mL (ref 0.450–4.500)

## 2019-11-13 ENCOUNTER — Ambulatory Visit: Payer: Commercial Managed Care - PPO

## 2019-11-16 ENCOUNTER — Other Ambulatory Visit: Payer: Self-pay

## 2019-11-16 ENCOUNTER — Encounter (INDEPENDENT_AMBULATORY_CARE_PROVIDER_SITE_OTHER): Payer: Self-pay | Admitting: Family Medicine

## 2019-11-16 ENCOUNTER — Ambulatory Visit (INDEPENDENT_AMBULATORY_CARE_PROVIDER_SITE_OTHER): Payer: No Typology Code available for payment source | Admitting: Family Medicine

## 2019-11-16 VITALS — BP 118/75 | HR 104 | Temp 98.3°F | Ht 62.0 in | Wt 229.0 lb

## 2019-11-16 DIAGNOSIS — Z6841 Body Mass Index (BMI) 40.0 and over, adult: Secondary | ICD-10-CM

## 2019-11-16 DIAGNOSIS — Z9189 Other specified personal risk factors, not elsewhere classified: Secondary | ICD-10-CM

## 2019-11-16 DIAGNOSIS — F3289 Other specified depressive episodes: Secondary | ICD-10-CM | POA: Diagnosis not present

## 2019-11-16 DIAGNOSIS — K5909 Other constipation: Secondary | ICD-10-CM | POA: Diagnosis not present

## 2019-11-16 DIAGNOSIS — R7303 Prediabetes: Secondary | ICD-10-CM

## 2019-11-16 DIAGNOSIS — D508 Other iron deficiency anemias: Secondary | ICD-10-CM | POA: Diagnosis not present

## 2019-11-16 MED ORDER — POLYETHYLENE GLYCOL 3350 17 GM/SCOOP PO POWD: 17.0000 g | Freq: Every day | ORAL | 0 refills | Status: DC

## 2019-11-16 MED ORDER — BUPROPION HCL ER (SR) 200 MG PO TB12: 200.0000 mg | ORAL_TABLET | Freq: Two times a day (BID) | ORAL | 0 refills | Status: DC

## 2019-11-16 MED ORDER — METFORMIN HCL 500 MG PO TABS: 500.0000 mg | ORAL_TABLET | Freq: Three times a day (TID) | ORAL | 0 refills | Status: DC

## 2019-11-16 MED ORDER — FERROUS SULFATE 325 (65 FE) MG PO TABS: 325.0000 mg | ORAL_TABLET | Freq: Every day | ORAL | 0 refills | Status: DC

## 2019-11-22 NOTE — Progress Notes (Signed)
Chief Complaint:   OBESITY Heidi Robinson is here to discuss her progress with her obesity treatment plan along with follow-up of her obesity related diagnoses. Heidi Robinson is on the Category 2 Plan and states she is following her eating plan approximately 10% of the time. Heidi Robinson states she is doing 0 minutes 0 times per week.  Today's visit was #: 50 Starting weight: 258 lbs Starting date: 12/21/17 Today's weight: 229 lbs Today's date: 11/16/2019 Total lbs lost to date: 29 Total lbs lost since last in-office visit: 0  Interim History: Heidi Robinson did some celebration eating over the holidays, but she tried to be mindful of her food choices. She notes her motivation to follow an eating plan strictly has waned.  Subjective:   1. Other iron deficiency anemia She is stable on FeSoy, and her constipation improved with miralax.  2. Pre-diabetes Heidi Robinson is struggling with diet, but she is tolerating metformin well. She denies nausea or vomiting.  3. Other constipation Heidi Robinson's constipation worsened with FeSoy, but is controlled with miralax.  4. Other depression Heidi Robinson is stable on Wellbutrin, but she notes decreased motivation to change and she is frustrated with this.  5. At risk for diabetes mellitus Heidi Robinson is at higher than average risk for developing diabetes due to her obesity.   Assessment/Plan:   1. Other iron deficiency anemia We will refill FeSoy for 1 month. Orders and follow up as documented in patient record.  Counseling . Iron is essential for our bodies to make red blood cells.  Reasons that someone may be deficient include: an iron-deficient diet (more likely in those following vegan or vegetarian diets), women with heavy menses, patients with GI disorders or poor absorption, patients that have had bariatric surgery, frequent blood donors, patients with cancer, and patients with heart disease.   [name] An iron supplement has been recommended. This is found over-the-counter. We recommend: Once  Daily: Slow Fe 45mg  Iron Supplement for Iron Deficiency, Slow Release, High Potency, Easy to Swallow Tablets.  [name] Noble foods include dark leafy greens, red and white meats, eggs, seafood, and beans.   . Certain foods and drinks prevent your body from absorbing iron properly. Avoid eating these foods in the same meal as iron-rich foods or with iron supplements. These foods include: coffee, black tea, and red wine; milk, dairy products, and foods that are high in calcium; beans and soybeans; whole grains.  . Constipation can be a side effect of iron supplementation. Increased water and fiber intake are helpful. Water goal: > 2 liters/day. Fiber goal: > 25 grams/day.  - ferrous sulfate 325 (65 FE) MG tablet; Take 1 tablet (325 mg total) by mouth daily with breakfast.  Dispense: 30 tablet; Refill: 0  2. Pre-diabetes Heidi Robinson will continue to work on weight loss, exercise, and decreasing simple carbohydrates to help decrease the risk of diabetes. We will refill metformin for 1 month. We will continue to monitor.  - metFORMIN (GLUCOPHAGE) 500 MG tablet; Take 1 tablet (500 mg total) by mouth 3 (three) times daily.  Dispense: 90 tablet; Refill: 0  3. Other constipation Heidi Robinson was informed that a decrease in bowel movement frequency is normal while losing weight, but stools should not be hard or painful. We will refill miralax for 1 month. Orders and follow up as documented in patient record.   Counseling Getting to Good Bowel Health:  Drink at least 8 glasses of water each day. Eat plenty of fiber (goal is over 25 grams each  day). It is best to get most of your fiber from dietary sources which includes leafy green vegetables, fresh fruit, and whole grains. You may need to add fiber with the help of OTC fiber supplements. These include Metamucil, Citrucel, and Flaxseed. If you are still having trouble, try adding Miralax or Magnesium Citrate. If all of these changes do not work, [employer].  -  polyethylene glycol powder (GLYCOLAX/MIRALAX) 17 GM/SCOOP powder; Take 17 g by mouth daily.  Dispense: 3350 g; Refill: 0  4. Other depression Behavior modification techniques were discussed today to help Heidi Robinson deal with her emotional/non-hunger eating behaviors.  Support and encouragement was offered. We will refill Wellbutrin for 1 month. Orders and follow up as documented in patient record.   - buPROPion (WELLBUTRIN SR) 200 MG 12 hr tablet; Take 1 tablet (200 mg total) by mouth 2 (two) times daily.  Dispense: 60 tablet; Refill: 0  5. At risk for diabetes mellitus Heidi Robinson was given approximately 15 minutes of diabetes education and counseling today. We discussed intensive lifestyle modifications today with an emphasis on weight loss as well as increasing exercise and decreasing simple carbohydrates in her diet. We also reviewed medication options with an emphasis on risk versus benefit of those discussed.   6. Class 3 severe obesity with serious comorbidity and body mass index (BMI) of 40.0 to 44.9 in adult, unspecified obesity type (HCC) Heidi Robinson is currently in the action stage of change. As such, her goal is to continue with weight loss efforts. She has agreed to change to keeping a food journal and adhering to recommended goals of 1200-1500 calories and 80+ grams of protein daily.   We discussed the following exercise goals today: For substantial health benefits, adults should do at least 150 minutes (2 hours and 30 minutes) a week of moderate-intensity, or 75 minutes (1 hour and 15 minutes) a week of vigorous-intensity aerobic physical activity, or an equivalent combination of moderate- and vigorous-intensity aerobic activity. Aerobic activity should be performed in episodes of at least 10 minutes, and preferably, it should be spread throughout the week. Adults should also include muscle-strengthening activities that involve all major muscle groups on 2 or more days a week.  We discussed the following  behavioral modification strategies today: emotional eating strategies and keeping a strict food journal.  Heidi Robinson has agreed to follow-up with our clinic in 3 to 4 weeks. She was informed of the importance of frequent follow-up visits to maximize her success with intensive lifestyle modifications for her multiple health conditions.   Objective:   Blood pressure 118/75, pulse (!) 104, temperature 98.3 F (36.8 C), temperature source Oral, height 5\' 2"  (1.575 m), weight 229 lb (103.9 kg), last menstrual period 11/04/2019, SpO2 100 %. Body mass index is 41.88 kg/m.  General: Cooperative, alert, well developed, in no acute distress. HEENT: Conjunctivae and lids unremarkable. Neck: No thyromegaly.  Cardiovascular: Regular rhythm.  Lungs: Normal work of breathing. Extremities: No edema.  Neurologic: No focal deficits.   Lab Results  Component Value Date   CREATININE 0.81 09/01/2019   BUN 7 09/01/2019   NA 140 09/01/2019   K 4.6 09/01/2019   CL 105 09/01/2019   CO2 23 09/01/2019   Lab Results  Component Value Date   ALT 7 09/01/2019   AST 8 09/01/2019   ALKPHOS 75 09/01/2019   BILITOT <0.2 09/01/2019   Lab Results  Component Value Date   HGBA1C 4.9 09/01/2019   HGBA1C 5.2 05/11/2018  HGBA1C 5.7 (H) 12/21/2017   HGBA1C 5.2 08/04/2017   HGBA1C 5.3 07/24/2016   Lab Results  Component Value Date   INSULIN 13.1 09/01/2019   INSULIN 16.7 05/11/2018   INSULIN 23.4 12/21/2017   Lab Results  Component Value Date   TSH 1.600 10/23/2019   Lab Results  Component Value Date   CHOL 169 09/01/2019   HDL 40 09/01/2019   LDLCALC 97 09/01/2019   TRIG 185 (H) 09/01/2019   CHOLHDL 3.8 08/04/2017   Lab Results  Component Value Date   WBC 11.4 (H) 10/23/2019   HGB 10.6 (L) 10/23/2019   HCT 33.5 (L) 10/23/2019   MCV 69 (L) 10/23/2019   PLT 423 10/23/2019   Lab Results  Component Value Date   IRON 25 (L) 09/01/2019   TIBC 367 09/01/2019   FERRITIN 13 (L) 09/01/2019    Attestation Statements:   Reviewed by clinician on day of visit: allergies, medications, problem list, medical history, surgical history, family history, social history, and previous encounter notes.   I, Trixie Dredge, am acting as transcriptionist for Dennard Nip, MD.  I have reviewed the above documentation for accuracy and completeness, and I agree with the above. -  Dennard Nip, MD

## 2019-12-21 ENCOUNTER — Other Ambulatory Visit: Payer: Self-pay

## 2019-12-21 ENCOUNTER — Ambulatory Visit (INDEPENDENT_AMBULATORY_CARE_PROVIDER_SITE_OTHER): Payer: No Typology Code available for payment source | Admitting: Family Medicine

## 2019-12-21 ENCOUNTER — Encounter (INDEPENDENT_AMBULATORY_CARE_PROVIDER_SITE_OTHER): Payer: Self-pay | Admitting: Family Medicine

## 2019-12-21 VITALS — BP 117/72 | HR 73 | Temp 98.1°F | Ht 62.0 in | Wt 227.0 lb

## 2019-12-21 DIAGNOSIS — E559 Vitamin D deficiency, unspecified: Secondary | ICD-10-CM | POA: Diagnosis not present

## 2019-12-21 DIAGNOSIS — E8881 Metabolic syndrome: Secondary | ICD-10-CM

## 2019-12-21 DIAGNOSIS — D508 Other iron deficiency anemias: Secondary | ICD-10-CM | POA: Diagnosis not present

## 2019-12-21 DIAGNOSIS — K5909 Other constipation: Secondary | ICD-10-CM

## 2019-12-21 DIAGNOSIS — Z9189 Other specified personal risk factors, not elsewhere classified: Secondary | ICD-10-CM | POA: Diagnosis not present

## 2019-12-21 DIAGNOSIS — F3289 Other specified depressive episodes: Secondary | ICD-10-CM

## 2019-12-21 DIAGNOSIS — Z6841 Body Mass Index (BMI) 40.0 and over, adult: Secondary | ICD-10-CM

## 2019-12-21 MED ORDER — BUPROPION HCL ER (SR) 200 MG PO TB12: 200.0000 mg | ORAL_TABLET | Freq: Two times a day (BID) | ORAL | 0 refills | Status: DC

## 2019-12-21 MED ORDER — METFORMIN HCL 500 MG PO TABS: 500.0000 mg | ORAL_TABLET | Freq: Three times a day (TID) | ORAL | 0 refills | Status: DC

## 2019-12-21 MED ORDER — VITAMIN D (ERGOCALCIFEROL) 1.25 MG (50000 UNIT) PO CAPS: 50000.0000 [IU] | ORAL_CAPSULE | ORAL | 0 refills | Status: DC

## 2019-12-21 MED ORDER — FERROUS SULFATE 325 (65 FE) MG PO TABS: 325.0000 mg | ORAL_TABLET | Freq: Every day | ORAL | 0 refills | Status: DC

## 2019-12-21 MED ORDER — POLYETHYLENE GLYCOL 3350 17 GM/SCOOP PO POWD: 17.0000 g | Freq: Every day | ORAL | 0 refills | Status: DC

## 2019-12-25 NOTE — Progress Notes (Signed)
Chief Complaint:   OBESITY [name] Robinson is here to discuss her progress with her obesity treatment plan along with follow-up of her obesity related diagnoses. Heidi Robinson is on the keeping a food journal of 1200 to 1500 calories and 80+ grams of protein daily plan and states she is following her eating plan approximately 0% of the time. Heidi Robinson states she is exercising 0 minutes 0 times per week.  Today's visit was #: 41 Starting weight: 258 lbs Starting date: 12/21/2017 Today's weight: 227 lbs Today's date: 12/21/2019 Total lbs lost to date: 31 Total lbs lost since last in-office visit: 2  Interim History: Heidi Robinson was on the journaling plan, but she was unable to journal strictly, but she was able to control her portions and make smarter food choices. She hasn't slept as much.  Subjective:   Vitamin D deficiency  Heidi Robinson's Vitamin D level was 51.0 on 12/21/2017. She is stable on vitamin D. She denies nausea, vomiting or muscle weakness.  Other iron deficiency anemia  Heidi Robinson is stable on iron supplement, plus she is trying to increase her dietary iron.  CBC Latest Ref Rng & Units 10/23/2019 09/01/2019 11/19/2018  WBC 3.4 - 10.8 x10E3/uL 11.4(H) 12.4(H) 12.5(H)  Hemoglobin 11.1 - 15.9 g/dL 10.6(L) 10.3(L) 9.5(L)  Hematocrit 34.0 - 46.6 % 33.5(L) 32.5(L) 30.0(L)  Platelets 150 - 450 x10E3/uL 423 - 496(H)   Lab Results  Component Value Date   IRON 25 (L) 09/01/2019   TIBC 367 09/01/2019   FERRITIN 13 (L) 09/01/2019   Lab Results  Component Value Date   VITAMINB12 512 09/01/2019   Insulin Resistance Heidi Robinson is doing well with diet and she is tolerating metformin. Heidi Robinson denies nausea or vomiting.  Lab Results  Component Value Date   INSULIN 13.1 09/01/2019   INSULIN 16.7 05/11/2018   INSULIN 23.4 12/21/2017   Lab Results  Component Value Date   HGBA1C 4.9 09/01/2019   Other constipation  Heidi Robinson notes constipation improved on Miralax, and patient requests a refill.  Other depression - with  emotional eating  Heidi Robinson struggles with emotional eating and using food for comfort to the extent that it is negatively impacting her health. Her mood is stable on Wellbutrin. She is working on [employer].  At risk for activity intolerance Heidi Robinson is at risk of exercise intolerance due to decreased activity.  Assessment/Plan:   Vitamin D deficiency  Low Vitamin D level contributes to fatigue and are associated with obesity, breast, and colon cancer. Bryson agrees to continue to take prescription Vitamin D @50 ,000 IU every week #4 with no refills and she will follow-up for routine testing of Vitamin D, at least 2-3 times per year to avoid over-replacement.  Other iron deficiency anemia  [name] agrees to continue  ferrous sulfate 325 (65 FE) MG tablet once daily #30 with no refills, and we will follow.  Insulin Resistance [name] will continue to work on weight loss, exercise, and decreasing simple carbohydrates to help decrease the risk of diabetes. [name] agreed to continue metFORMIN (GLUCOPHAGE) 500 MG tablet three times daily #90 with no refills and follow-up with [place] as directed to closely monitor her progress.  Other constipation  [name] was informed that a decrease in bowel movement frequency is normal while losing weight, but stools should not be hard or painful. [name] agrees to continue polyethylene glycol powder (GLYCOLAX/MIRALAX) 17 GM/SCOOP powder daily. Orders and follow up as documented in patient record.   Other depression - with emotional eating  Heidi Robinson agrees to continue buPROPion (WELLBUTRIN SR) 200 MG 12 hr tablet 2 times daily #60 with no refills. Orders and follow up as documented in patient record.   At risk for activity intolerance Heidi Robinson was given approximately 15 minutes of exercise intolerance counseling today. She is 44 y.o. female and has risk factors exercise intolerance including obesity. We discussed intensive lifestyle modifications today with an emphasis on specific  weight loss instructions and strategies. Heidi Robinson will slowly increase activity as tolerated.  Repetitive spaced learning was employed today to elicit superior memory formation and behavioral change.  Class 3 severe obesity with serious comorbidity and body mass index (BMI) of 40.0 to 44.9 in adult, unspecified obesity type (HCC) Heidi Robinson is currently in the action stage of change. As such, her goal is to continue with weight loss efforts. She has agreed to practicing portion control and making smarter food choices, such as increasing vegetables and decreasing simple carbohydrates.   Exercise goals: Increase exercise slowly, as patient continue to lose weight.  Behavioral modification strategies: increasing lean protein intake and planning for success.  Heidi Robinson is to work on increasing sleep to at least 7 hours per night and journal when she can.  Heidi Robinson has agreed to follow-up with our clinic in 4 weeks. She was informed of the importance of frequent follow-up visits to maximize her success with intensive lifestyle modifications for her multiple health conditions.   Objective:   Blood pressure 117/72, pulse 73, temperature 98.1 F (36.7 C), temperature source Oral, height 5\' 2"  (1.575 m), weight 227 lb (103 kg), SpO2 98 %. Body mass index is 41.52 kg/m.  General: Cooperative, alert, well developed, in no acute distress. HEENT: Conjunctivae and lids unremarkable. Cardiovascular: Regular rhythm.  Lungs: Normal work of breathing. Neurologic: No focal deficits.   Lab Results  Component Value Date   CREATININE 0.81 09/01/2019   BUN 7 09/01/2019   NA 140 09/01/2019   K 4.6 09/01/2019   CL 105 09/01/2019   CO2 23 09/01/2019   Lab Results  Component Value Date   ALT 7 09/01/2019   AST 8 09/01/2019   ALKPHOS 75 09/01/2019   BILITOT <0.2 09/01/2019   Lab Results  Component Value Date   HGBA1C 4.9 09/01/2019   HGBA1C 5.2 05/11/2018   HGBA1C 5.7 (H) 12/21/2017   HGBA1C 5.2 08/04/2017    HGBA1C 5.3 07/24/2016   Lab Results  Component Value Date   INSULIN 13.1 09/01/2019   INSULIN 16.7 05/11/2018   INSULIN 23.4 12/21/2017   Lab Results  Component Value Date   TSH 1.600 10/23/2019   Lab Results  Component Value Date   CHOL 169 09/01/2019   HDL 40 09/01/2019   LDLCALC 97 09/01/2019   TRIG 185 (H) 09/01/2019   CHOLHDL 3.8 08/04/2017   Lab Results  Component Value Date   WBC 11.4 (H) 10/23/2019   HGB 10.6 (L) 10/23/2019   HCT 33.5 (L) 10/23/2019   MCV 69 (L) 10/23/2019   PLT 423 10/23/2019   Lab Results  Component Value Date   IRON 25 (L) 09/01/2019   TIBC 367 09/01/2019   FERRITIN 13 (L) 09/01/2019    Ref. Range 09/01/2019 09:02  Vitamin D, 25-Hydroxy Latest Ref Range: 30.0 - 100.0 ng/mL 51.0    Attestation Statements:   Reviewed by clinician on day of visit: allergies, medications, problem list, medical history, surgical history, family history, social history, and previous encounter notes.  Corey Skains, am acting as Location manager for Dennard Nip, MD.  I have reviewed the above documentation for accuracy and completeness, and I agree with the above. -  Dennard Nip, MD

## 2020-01-18 ENCOUNTER — Other Ambulatory Visit: Payer: Self-pay

## 2020-01-18 ENCOUNTER — Ambulatory Visit (INDEPENDENT_AMBULATORY_CARE_PROVIDER_SITE_OTHER): Payer: No Typology Code available for payment source | Admitting: Family Medicine

## 2020-01-18 ENCOUNTER — Encounter (INDEPENDENT_AMBULATORY_CARE_PROVIDER_SITE_OTHER): Payer: Self-pay | Admitting: Family Medicine

## 2020-01-18 VITALS — BP 111/79 | HR 90 | Temp 98.1°F | Ht 62.0 in | Wt 230.0 lb

## 2020-01-18 DIAGNOSIS — Z9189 Other specified personal risk factors, not elsewhere classified: Secondary | ICD-10-CM

## 2020-01-18 DIAGNOSIS — F3289 Other specified depressive episodes: Secondary | ICD-10-CM

## 2020-01-18 DIAGNOSIS — E559 Vitamin D deficiency, unspecified: Secondary | ICD-10-CM | POA: Diagnosis not present

## 2020-01-18 DIAGNOSIS — D508 Other iron deficiency anemias: Secondary | ICD-10-CM

## 2020-01-18 DIAGNOSIS — Z6841 Body Mass Index (BMI) 40.0 and over, adult: Secondary | ICD-10-CM

## 2020-01-18 DIAGNOSIS — R7303 Prediabetes: Secondary | ICD-10-CM | POA: Diagnosis not present

## 2020-01-18 MED ORDER — VITAMIN D (ERGOCALCIFEROL) 1.25 MG (50000 UNIT) PO CAPS: 50000.0000 [IU] | ORAL_CAPSULE | ORAL | 0 refills | Status: DC

## 2020-01-18 MED ORDER — METFORMIN HCL 500 MG PO TABS: 500.0000 mg | ORAL_TABLET | Freq: Three times a day (TID) | ORAL | 0 refills | Status: DC

## 2020-01-18 MED ORDER — FERROUS SULFATE 325 (65 FE) MG PO TABS: 325.0000 mg | ORAL_TABLET | Freq: Every day | ORAL | 0 refills | Status: DC

## 2020-01-18 MED ORDER — BUPROPION HCL ER (SR) 200 MG PO TB12: 200.0000 mg | ORAL_TABLET | Freq: Two times a day (BID) | ORAL | 0 refills | Status: DC

## 2020-01-18 NOTE — Progress Notes (Signed)
Chief Complaint:   OBESITY Heidi Robinson is here to discuss her progress with her obesity treatment plan along with follow-up of her obesity related diagnoses. Heidi Robinson is on practicing portion control and making smarter food choices, such as increasing vegetables and decreasing simple carbohydrates and states she is following her eating plan approximately 50% of the time. Heidi Robinson states she is doing cardio for 30 minutes 3-4 times per week, and weights for 10 minutes 3 times per week.  Today's visit was #: 76 Starting weight: 258 lbs Starting date: 12/21/2017 Today's weight: 230 lbs Today's date: 01/18/2020 Total lbs lost to date: 28 Total lbs lost since last in-office visit: 0  Interim History: Heidi Robinson has been working on journaling more. She is trying to increase her protein but she is averaging approximately 60 grams of protein daily, which is still low enough to decreased her RMR.  Subjective:   1. Vitamin D deficiency Heidi Robinson is stable on Vit D and she denies nausea or vomiting.  2. Other iron deficiency anemia Heidi Robinson is stable on iron, and she denies constipation. She requests a refill today.  3. Pre-diabetes Heidi Robinson is doing well with diet and weight loss. She denies hypoglycemia.  4. Other depression, with emotional eating Heidi Robinson is stable on Wellbutrin, and her blood pressure is well controlled. She denies insomnia. She requests a refill today.  5. At risk for hypoglycemia Heidi Robinson is at increased risk for hypoglycemia due to decreased A1c and metformin. Heidi Robinson is not currently taking insulin.   Assessment/Plan:   1. Vitamin D deficiency Low Vitamin D level contributes to fatigue and are associated with obesity, breast, and colon cancer. We will refill prescription Vitamin D for 1 month. Lecia will follow-up for routine testing of Vitamin D, at least 2-3 times per year to avoid over-replacement.  - Vitamin D, Ergocalciferol, (DRISDOL) 1.25 MG (50000 UNIT) CAPS capsule; Take 1 capsule (50,000 Units  total) by mouth every 7 (seven) days.  Dispense: 4 capsule; Refill: 0  2. Other iron deficiency anemia We will refill FeSo4 for 1 month. Orders and follow up as documented in patient record.  - ferrous sulfate 325 (65 FE) MG tablet; Take 1 tablet (325 mg total) by mouth daily with breakfast.  Dispense: 30 tablet; Refill: 0  3. Pre-diabetes Heidi Robinson will continue to work on weight loss, diet, exercise, and decreasing simple carbohydrates to help decrease the risk of diabetes. We will refill metformin for 1 month.  - metFORMIN (GLUCOPHAGE) 500 MG tablet; Take 1 tablet (500 mg total) by mouth 3 (three) times daily.  Dispense: 90 tablet; Refill: 0  4. Other depression, with emotional eating Behavior modification techniques were discussed today to help Heidi Robinson deal with her emotional/non-hunger eating behaviors. We will refill Wellbutrin SR for 1 month. Orders and follow up as documented in patient record.   - buPROPion (WELLBUTRIN SR) 200 MG 12 hr tablet; Take 1 tablet (200 mg total) by mouth 2 (two) times daily.  Dispense: 60 tablet; Refill: 0  5. At risk for hypoglycemia Heidi Robinson was given approximately 15 minutes of counseling today regarding prevention of hypoglycemia. She was advised of symptoms of hypoglycemia. Heidi Robinson was instructed to avoid skipping meals, eat regular protein rich meals and schedule low calorie snacks as needed.   Repetitive spaced learning was employed today to elicit superior memory formation and behavioral change  6. Class 3 severe obesity with serious comorbidity and body mass index (BMI) of 40.0 to 44.9 in adult, unspecified obesity type (Heidi Robinson) Heidi Robinson  is currently in the action stage of change. As such, her goal is to continue with weight loss efforts. She has agreed to keeping a food journal and adhering to recommended goals of 1200-1500 calories and 80+ grams of protein daily.   Exercise goals: As is.  Behavioral modification strategies: increasing lean protein intake.  Heidi Robinson  has agreed to follow-up with our clinic in 4 weeks. She was informed of the importance of frequent follow-up visits to maximize her success with intensive lifestyle modifications for her multiple health conditions.   Objective:   Blood pressure 111/79, pulse 90, temperature 98.1 F (36.7 C), temperature source Oral, height 5\' 2"  (1.575 m), weight 230 lb (104.3 kg), last menstrual period 12/26/2019, SpO2 99 %. Body mass index is 42.07 kg/m.  General: Cooperative, alert, well developed, in no acute distress. HEENT: Conjunctivae and lids unremarkable. Cardiovascular: Regular rhythm.  Lungs: Normal work of breathing. Neurologic: No focal deficits.   Lab Results  Component Value Date   CREATININE 0.81 09/01/2019   BUN 7 09/01/2019   NA 140 09/01/2019   K 4.6 09/01/2019   CL 105 09/01/2019   CO2 23 09/01/2019   Lab Results  Component Value Date   ALT 7 09/01/2019   AST 8 09/01/2019   ALKPHOS 75 09/01/2019   BILITOT <0.2 09/01/2019   Lab Results  Component Value Date   HGBA1C 4.9 09/01/2019   HGBA1C 5.2 05/11/2018   HGBA1C 5.7 (H) 12/21/2017   HGBA1C 5.2 08/04/2017   HGBA1C 5.3 07/24/2016   Lab Results  Component Value Date   INSULIN 13.1 09/01/2019   INSULIN 16.7 05/11/2018   INSULIN 23.4 12/21/2017   Lab Results  Component Value Date   TSH 1.600 10/23/2019   Lab Results  Component Value Date   CHOL 169 09/01/2019   HDL 40 09/01/2019   LDLCALC 97 09/01/2019   TRIG 185 (H) 09/01/2019   CHOLHDL 3.8 08/04/2017   Lab Results  Component Value Date   WBC 11.4 (H) 10/23/2019   HGB 10.6 (L) 10/23/2019   HCT 33.5 (L) 10/23/2019   MCV 69 (L) 10/23/2019   PLT 423 10/23/2019   Lab Results  Component Value Date   IRON 25 (L) 09/01/2019   TIBC 367 09/01/2019   FERRITIN 13 (L) 09/01/2019   Attestation Statements:   Reviewed by clinician on day of visit: allergies, medications, problem list, medical history, surgical history, family history, social history, and  previous encounter notes.   I, Trixie Dredge, am acting as transcriptionist for Dennard Nip, MD.  I have reviewed the above documentation for accuracy and completeness, and I agree with the above. -  Dennard Nip, MD

## 2020-02-12 ENCOUNTER — Encounter (INDEPENDENT_AMBULATORY_CARE_PROVIDER_SITE_OTHER): Payer: Self-pay | Admitting: Family Medicine

## 2020-02-12 ENCOUNTER — Ambulatory Visit (INDEPENDENT_AMBULATORY_CARE_PROVIDER_SITE_OTHER): Payer: No Typology Code available for payment source | Admitting: Family Medicine

## 2020-02-12 ENCOUNTER — Other Ambulatory Visit: Payer: Self-pay

## 2020-02-12 VITALS — BP 106/72 | HR 98 | Temp 98.4°F | Ht 62.0 in | Wt 230.0 lb

## 2020-02-12 DIAGNOSIS — R7303 Prediabetes: Secondary | ICD-10-CM | POA: Diagnosis not present

## 2020-02-12 DIAGNOSIS — F3289 Other specified depressive episodes: Secondary | ICD-10-CM

## 2020-02-12 DIAGNOSIS — D508 Other iron deficiency anemias: Secondary | ICD-10-CM | POA: Diagnosis not present

## 2020-02-12 DIAGNOSIS — Z6841 Body Mass Index (BMI) 40.0 and over, adult: Secondary | ICD-10-CM

## 2020-02-12 DIAGNOSIS — E559 Vitamin D deficiency, unspecified: Secondary | ICD-10-CM | POA: Diagnosis not present

## 2020-02-12 DIAGNOSIS — Z9189 Other specified personal risk factors, not elsewhere classified: Secondary | ICD-10-CM | POA: Diagnosis not present

## 2020-02-12 MED ORDER — METFORMIN HCL 500 MG PO TABS: 500.0000 mg | ORAL_TABLET | Freq: Three times a day (TID) | ORAL | 0 refills | Status: DC

## 2020-02-12 MED ORDER — BUPROPION HCL ER (SR) 200 MG PO TB12: 200.0000 mg | ORAL_TABLET | Freq: Two times a day (BID) | ORAL | 0 refills | Status: DC

## 2020-02-12 MED ORDER — FERROUS SULFATE 325 (65 FE) MG PO TABS: 325.0000 mg | ORAL_TABLET | Freq: Every day | ORAL | 0 refills | Status: DC

## 2020-02-12 MED ORDER — VITAMIN D (ERGOCALCIFEROL) 1.25 MG (50000 UNIT) PO CAPS: 50000.0000 [IU] | ORAL_CAPSULE | ORAL | 0 refills | Status: DC

## 2020-02-12 NOTE — Progress Notes (Signed)
Chief Complaint:   OBESITY Heidi Robinson is here to discuss her progress with her obesity treatment plan along with follow-up of her obesity related diagnoses. Heidi Robinson is on keeping a food journal and adhering to recommended goals of 1200-1500 calories and 80+ grams of protein daily and states she is following her eating plan approximately 15% of the time. Heidi Robinson states she is walking for 30 minutes 2 times per week.  Today's visit was #: 37 Starting weight: 258 lbs Starting date: 12/21/2017 Today's weight: 230 lbs Today's date: 02/12/2020 Total lbs lost to date: 28 Total lbs lost since last in-office visit: 0  Interim History: Heidi Robinson finds it challenging to follow journaling meal plan with her hectic work schedule and her son's traveling for sports.  Subjective:   1. Vitamin D deficiency Heidi Robinson's Vit D level on 09/01/2019 was 51.0. She is currently on prescription strength Vit D supplementation.  2. Other depression, with emotional eating Heidi Robinson's mood is stable, and emotional eating has decreased on bupropion.  3. Other iron deficiency anemia Heidi Robinson reports constipation with ferrous sulfate.  4. Pre-diabetes Heidi Robinson is tolerating metformin if she takes with meals.  5. At risk for impaired metabolic function Heidi Robinson is at increased risk for impaired metabolic function due to decreased protein intake.  Assessment/Plan:   1. Vitamin D deficiency Low Vitamin D level contributes to fatigue and are associated with obesity, breast, and colon cancer. We will refill prescription Vitamin D for 1 month. Heidi Robinson will follow-up for routine testing of Vitamin D, at least 2-3 times per year to avoid over-replacement.  - Vitamin D, Ergocalciferol, (DRISDOL) 1.25 MG (50000 UNIT) CAPS capsule; Take 1 capsule (50,000 Units total) by mouth every 7 (seven) days.  Dispense: 4 capsule; Refill: 0  2. Other depression, with emotional eating Behavior modification techniques were discussed today to help Heidi Robinson deal with her  emotional/non-hunger eating behaviors. we will refill bupropion SR for 1 month. Orders and follow up as documented in patient record.   - buPROPion (WELLBUTRIN SR) 200 MG 12 hr tablet; Take 1 tablet (200 mg total) by mouth 2 (two) times daily.  Dispense: 60 tablet; Refill: 0  3. Other iron deficiency anemia We will refill ferrous sulfate for 1 month. Heidi Robinson is to increase her water intake. Orders and follow up as documented in patient record.  - ferrous sulfate 325 (65 FE) MG tablet; Take 1 tablet (325 mg total) by mouth daily with breakfast.  Dispense: 30 tablet; Refill: 0  4. Pre-diabetes Heidi Robinson will continue to work on weight loss, exercise, and decreasing simple carbohydrates to help decrease the risk of diabetes. We will refill metformin for 1 month. Heidi Robinson was encouraged to set a timer to remind her to take metformin TID.  - metFORMIN (GLUCOPHAGE) 500 MG tablet; Take 1 tablet (500 mg total) by mouth 3 (three) times daily.  Dispense: 90 tablet; Refill: 0  5. At risk for impaired metabolic function Heidi Robinson was given approximately 15 minutes of impaired  metabolic function prevention counseling today. We discussed intensive lifestyle modifications today with an emphasis on specific nutrition and exercise instructions and strategies.   Repetitive spaced learning was employed today to elicit superior memory formation and behavioral change.  6. Class 3 severe obesity with serious comorbidity and body mass index (BMI) of 40.0 to 44.9 in adult, unspecified obesity type (HCC) Heidi Robinson is currently in the action stage of change. As such, her goal is to continue with weight loss efforts. She has agreed to keeping a  food journal and adhering to recommended goals of 1200-1500 calories and 80+ grams of protein daily.   Exercise goals: As is.  Behavioral modification strategies: increasing lean protein intake, increasing water intake, no skipping meals and meal planning and cooking strategies.  Heidi Robinson has agreed  to follow-up with our clinic in 4 weeks. She was informed of the importance of frequent follow-up visits to maximize her success with intensive lifestyle modifications for her multiple health conditions.   Objective:   Blood pressure 106/72, pulse 98, temperature 98.4 F (36.9 C), temperature source Oral, height 5\' 2"  (1.575 m), weight 230 lb (104.3 kg), SpO2 100 %. Body mass index is 42.07 kg/m.  General: Cooperative, alert, well developed, in no acute distress. HEENT: Conjunctivae and lids unremarkable. Cardiovascular: Regular rhythm.  Lungs: Normal work of breathing. Neurologic: No focal deficits.   Lab Results  Component Value Date   CREATININE 0.81 09/01/2019   BUN 7 09/01/2019   NA 140 09/01/2019   K 4.6 09/01/2019   CL 105 09/01/2019   CO2 23 09/01/2019   Lab Results  Component Value Date   ALT 7 09/01/2019   AST 8 09/01/2019   ALKPHOS 75 09/01/2019   BILITOT <0.2 09/01/2019   Lab Results  Component Value Date   HGBA1C 4.9 09/01/2019   HGBA1C 5.2 05/11/2018   HGBA1C 5.7 (H) 12/21/2017   HGBA1C 5.2 08/04/2017   HGBA1C 5.3 07/24/2016   Lab Results  Component Value Date   INSULIN 13.1 09/01/2019   INSULIN 16.7 05/11/2018   INSULIN 23.4 12/21/2017   Lab Results  Component Value Date   TSH 1.600 10/23/2019   Lab Results  Component Value Date   CHOL 169 09/01/2019   HDL 40 09/01/2019   LDLCALC 97 09/01/2019   TRIG 185 (H) 09/01/2019   CHOLHDL 3.8 08/04/2017   Lab Results  Component Value Date   WBC 11.4 (H) 10/23/2019   HGB 10.6 (L) 10/23/2019   HCT 33.5 (L) 10/23/2019   MCV 69 (L) 10/23/2019   PLT 423 10/23/2019   Lab Results  Component Value Date   IRON 25 (L) 09/01/2019   TIBC 367 09/01/2019   FERRITIN 13 (L) 09/01/2019   Attestation Statements:   Reviewed by clinician on day of visit: allergies, medications, problem list, medical history, surgical history, family history, social history, and previous encounter notes.   I, Trixie Dredge,  am acting as transcriptionist for Dennard Nip, MD.  I have reviewed the above documentation for accuracy and completeness, and I agree with the above. -  Dennard Nip, MD

## 2020-02-17 ENCOUNTER — Encounter: Payer: Self-pay | Admitting: Family Medicine

## 2020-02-24 ENCOUNTER — Ambulatory Visit: Payer: No Typology Code available for payment source | Attending: Internal Medicine

## 2020-02-24 DIAGNOSIS — Z23 Encounter for immunization: Secondary | ICD-10-CM

## 2020-02-24 NOTE — Progress Notes (Signed)
   Covid-19 Vaccination Clinic  Name:  Heidi Robinson    MRN: [id number] DOB: Sep 26, 1976  02/24/2020  [name] was observed post Covid-19 immunization for 30 minutes based on pre-vaccination screening without incident. She was provided with Vaccine Information Sheet and instruction to access the V-Safe system.   [name] was instructed to call 911 with any severe reactions post vaccine: [name] Difficulty breathing  . Swelling of face and throat  . A fast heartbeat  . A bad rash all over body  . Dizziness and weakness   Immunizations Administered    Name Date Dose VIS Date Route   Pfizer COVID-19 Vaccine 02/24/2020  1:04 PM 0.3 mL 10/20/2019 Intramuscular   Manufacturer: [place]   Lot: [id number]   [place]: [id number]

## 2020-03-11 ENCOUNTER — Other Ambulatory Visit: Payer: Self-pay

## 2020-03-11 ENCOUNTER — Encounter (INDEPENDENT_AMBULATORY_CARE_PROVIDER_SITE_OTHER): Payer: Self-pay | Admitting: Family Medicine

## 2020-03-11 ENCOUNTER — Ambulatory Visit (INDEPENDENT_AMBULATORY_CARE_PROVIDER_SITE_OTHER): Payer: No Typology Code available for payment source | Admitting: Family Medicine

## 2020-03-11 VITALS — BP 117/71 | HR 90 | Temp 98.1°F | Ht 62.0 in | Wt 235.0 lb

## 2020-03-11 DIAGNOSIS — R7303 Prediabetes: Secondary | ICD-10-CM

## 2020-03-11 DIAGNOSIS — Z6841 Body Mass Index (BMI) 40.0 and over, adult: Secondary | ICD-10-CM

## 2020-03-11 DIAGNOSIS — K5909 Other constipation: Secondary | ICD-10-CM | POA: Diagnosis not present

## 2020-03-11 DIAGNOSIS — E559 Vitamin D deficiency, unspecified: Secondary | ICD-10-CM | POA: Diagnosis not present

## 2020-03-11 DIAGNOSIS — Z9189 Other specified personal risk factors, not elsewhere classified: Secondary | ICD-10-CM

## 2020-03-11 DIAGNOSIS — D508 Other iron deficiency anemias: Secondary | ICD-10-CM

## 2020-03-11 DIAGNOSIS — F3289 Other specified depressive episodes: Secondary | ICD-10-CM

## 2020-03-11 MED ORDER — VITAMIN D (ERGOCALCIFEROL) 1.25 MG (50000 UNIT) PO CAPS: 50000.0000 [IU] | ORAL_CAPSULE | ORAL | 0 refills | Status: DC

## 2020-03-11 MED ORDER — POLYETHYLENE GLYCOL 3350 17 GM/SCOOP PO POWD: 17.0000 g | Freq: Every day | ORAL | 0 refills | Status: DC

## 2020-03-11 MED ORDER — METFORMIN HCL 500 MG PO TABS: 500.0000 mg | ORAL_TABLET | Freq: Three times a day (TID) | ORAL | 0 refills | Status: DC

## 2020-03-11 MED ORDER — FERROUS SULFATE 325 (65 FE) MG PO TABS: 325.0000 mg | ORAL_TABLET | Freq: Every day | ORAL | 0 refills | Status: DC

## 2020-03-11 MED ORDER — BUPROPION HCL ER (SR) 200 MG PO TB12: 200.0000 mg | ORAL_TABLET | Freq: Two times a day (BID) | ORAL | 0 refills | Status: DC

## 2020-03-11 MED ORDER — TOPIRAMATE 50 MG PO TABS: 50.0000 mg | ORAL_TABLET | Freq: Every day | ORAL | 0 refills | Status: DC

## 2020-03-11 NOTE — Progress Notes (Signed)
Chief Complaint:   OBESITY Heidi Robinson is here to discuss her progress with her obesity treatment plan along with follow-up of her obesity related diagnoses. Heidi Robinson is on keeping a food journal and adhering to recommended goals of 1200-1500 calories and 80+ grams of protein daily and states she is following her eating plan approximately 25% of the time. Heidi Robinson states she is walking and lifting some free weights for 30 minutes 2-3 times per week.  Today's visit was #: 30 Starting weight: 258 lbs Starting date: 12/21/2017 Today's weight: 235 lbs Today's date: 03/11/2020 Total lbs lost to date: 23 Total lbs lost since last in-office visit: 0  Interim History: Heidi Robinson has had increased celebration eating on vacations, but she is ready to get back on track. She doesn't eat a lot of food and sometimes does not get enough calories.  Subjective:   1. Vitamin D deficiency Heidi Robinson's is stable on Vit D and she is due for labs.  2. Other iron deficiency anemia Heidi Robinson is stable on FeSo4, and she is due for labs.  3. Pre-diabetes Heidi Robinson is working on diet and weight loss.  4. Other constipation Heidi Robinson notes constipation has worsened with FeSo4, and relived with miralax.  5. Other depression, with emotional eating Heidi Robinson is struggling with increased emotional eating, worse in the PM. She is tolerating Wellbutrin well.   6. At risk for side effect of medication Heidi Robinson is at risk of drug side effects of Topamax and birth defects.  Assessment/Plan:   1. Vitamin D deficiency Low Vitamin D level contributes to fatigue and are associated with obesity, breast, and colon cancer. We will check labs today, and we will refill prescription Vitamin D for 1 month. Heidi Robinson will follow-up for routine testing of Vitamin D, at least 2-3 times per year to avoid over-replacement.  - VITAMIN D 25 Hydroxy (Vit-D Deficiency, Fractures)  - Vitamin D, Ergocalciferol, (DRISDOL) 1.25 MG (50000 UNIT) CAPS capsule; Take 1 capsule (50,000  Units total) by mouth every 7 (seven) days.  Dispense: 4 capsule; Refill: 0  2. Other iron deficiency anemia We will check labs today, and we refill FeSo4 for 1 month. Orders and follow up as documented in patient record.  - CBC with Differential/Platelet - Comprehensive metabolic panel - Folate - Vitamin B12  - ferrous sulfate 325 (65 FE) MG tablet; Take 1 tablet (325 mg total) by mouth daily with breakfast.  Dispense: 30 tablet; Refill: 0  3. Pre-diabetes Heidi Robinson will continue to work on weight loss, exercise, and decreasing simple carbohydrates to help decrease the risk of diabetes. We will check labs today, and we will refill metformin for 1 month.  - Hemoglobin A1c - Insulin, random - Lipid Panel With LDL/HDL Ratio - T3 - T4, free - TSH  - metFORMIN (GLUCOPHAGE) 500 MG tablet; Take 1 tablet (500 mg total) by mouth 3 (three) times daily.  Dispense: 90 tablet; Refill: 0  4. Other constipation Heidi Robinson was informed that a decrease in bowel movement frequency is normal while losing weight, but stools should not be hard or painful. We will refill miralax for 1 month. Orders and follow up as documented in patient record.   - polyethylene glycol powder (GLYCOLAX/MIRALAX) 17 GM/SCOOP powder; Take 17 g by mouth daily.  Dispense: 3350 g; Refill: 0  5. Other depression, with emotional eating Behavior modification techniques were discussed today to help Heidi Robinson deal with her emotional/non-hunger eating behaviors. Heidi Robinson agreed to start topiramate 50 mg qhs with no refills. Heidi Robinson  was warned to use birth control every time as this medication can cause birth defects. We will refill Wellbutrin SR for 1 month. Orders and follow up as documented in patient record.   - topiramate (TOPAMAX) 50 MG tablet; Take 1 tablet (50 mg total) by mouth at bedtime.  Dispense: 30 tablet; Refill: 0 - buPROPion (WELLBUTRIN SR) 200 MG 12 hr tablet; Take 1 tablet (200 mg total) by mouth 2 (two) times daily.  Dispense: 60 tablet;  Refill: 0  6. At risk for side effect of medication Heidi Robinson was given approximately 15 minutes of drug side effect counseling today. Heidi Robinson is to use condoms every time she has intercourse. We discussed side effect possibility and risk versus benefits. Heidi Robinson agreed to the medication and will contact this office if these side effects are intolerable.  Repetitive spaced learning was employed today to elicit superior memory formation and behavioral change.  7. Class 3 severe obesity with serious comorbidity and body mass index (BMI) of 40.0 to 44.9 in adult, unspecified obesity type (HCC) Heidi Robinson is currently in the action stage of change. As such, her goal is to continue with weight loss efforts. She has agreed to keeping a food journal and adhering to recommended goals of 1200-1500 calories and 85+ grams of protein daily.   Exercise goals: As is.  Behavioral modification strategies: meal planning and cooking strategies, emotional eating strategies, holiday eating strategies , celebration eating strategies and keeping a strict food journal.  Heidi Robinson has agreed to follow-up with our clinic in 4 weeks. She was informed of the importance of frequent follow-up visits to maximize her success with intensive lifestyle modifications for her multiple health conditions.   Heidi Robinson was informed we would discuss her lab results at her next visit unless there is a critical issue that needs to be addressed sooner. Heidi Robinson agreed to keep her next visit at the agreed upon time to discuss these results.  Objective:   Blood pressure 117/71, pulse 90, temperature 98.1 F (36.7 C), temperature source Oral, height 5\' 2"  (1.575 m), weight 235 lb (106.6 kg), SpO2 98 %. Body mass index is 42.98 kg/m.  General: Cooperative, alert, well developed, in no acute distress. HEENT: Conjunctivae and lids unremarkable. Cardiovascular: Regular rhythm.  Lungs: Normal work of breathing. Neurologic: No focal deficits.   Lab Results  Component  Value Date   CREATININE 0.81 09/01/2019   BUN 7 09/01/2019   NA 140 09/01/2019   K 4.6 09/01/2019   CL 105 09/01/2019   CO2 23 09/01/2019   Lab Results  Component Value Date   ALT 7 09/01/2019   AST 8 09/01/2019   ALKPHOS 75 09/01/2019   BILITOT <0.2 09/01/2019   Lab Results  Component Value Date   HGBA1C 4.9 09/01/2019   HGBA1C 5.2 05/11/2018   HGBA1C 5.7 (H) 12/21/2017   HGBA1C 5.2 08/04/2017   HGBA1C 5.3 07/24/2016   Lab Results  Component Value Date   INSULIN 13.1 09/01/2019   INSULIN 16.7 05/11/2018   INSULIN 23.4 12/21/2017   Lab Results  Component Value Date   TSH 1.600 10/23/2019   Lab Results  Component Value Date   CHOL 169 09/01/2019   HDL 40 09/01/2019   LDLCALC 97 09/01/2019   TRIG 185 (H) 09/01/2019   CHOLHDL 3.8 08/04/2017   Lab Results  Component Value Date   WBC 11.4 (H) 10/23/2019   HGB 10.6 (L) 10/23/2019   HCT 33.5 (L) 10/23/2019   MCV 69 (L) 10/23/2019   PLT  423 10/23/2019   Lab Results  Component Value Date   IRON 25 (L) 09/01/2019   TIBC 367 09/01/2019   FERRITIN 13 (L) 09/01/2019   Attestation Statements:   Reviewed by clinician on day of visit: allergies, medications, problem list, medical history, surgical history, family history, social history, and previous encounter notes.   I, Trixie Dredge, am acting as transcriptionist for Dennard Nip, MD.  I have reviewed the above documentation for accuracy and completeness, and I agree with the above. -  Dennard Nip, MD

## 2020-03-14 LAB — COMPREHENSIVE METABOLIC PANEL
ALT: 8 IU/L (ref 0–32)
AST: 14 IU/L (ref 0–40)
Albumin/Globulin Ratio: 1.7 (ref 1.2–2.2)
Albumin: 4 g/dL (ref 3.8–4.8)
Alkaline Phosphatase: 74 IU/L (ref 39–117)
BUN/Creatinine Ratio: 12 (ref 9–23)
BUN: 10 mg/dL (ref 6–24)
Bilirubin Total: <0.2 mg/dL (ref 0.0–1.2)
CO2: 23 mmol/L (ref 20–29)
Calcium: 9.5 mg/dL (ref 8.7–10.2)
Chloride: 102 mmol/L (ref 96–106)
Creatinine, Ser: 0.81 mg/dL (ref 0.57–1.00)
GFR calc Af Amer: 103 mL/min/{1.73_m2} (ref >59)
GFR calc non Af Amer: 89 mL/min/{1.73_m2} (ref >59)
Globulin, Total: 2.4 g/dL (ref 1.5–4.5)
Glucose: 90 mg/dL (ref 65–99)
Potassium: 4.4 mmol/L (ref 3.5–5.2)
Sodium: 140 mmol/L (ref 134–144)
Total Protein: 6.4 g/dL (ref 6.0–8.5)

## 2020-03-14 LAB — CBC WITH DIFFERENTIAL/PLATELET
Basophils Absolute: 0 10*3/uL (ref 0.0–0.2)
Basos: 0 %
EOS (ABSOLUTE): 0.3 10*3/uL (ref 0.0–0.4)
Eos: 4 %
Hematocrit: 35.7 % (ref 34.0–46.6)
Hemoglobin: 11 g/dL — ABNORMAL LOW (ref 11.1–15.9)
Immature Grans (Abs): 0.1 10*3/uL (ref 0.0–0.1)
Immature Granulocytes: 1 %
Lymphocytes Absolute: 1.5 10*3/uL (ref 0.7–3.1)
Lymphs: 17 %
MCH: 22.3 pg — ABNORMAL LOW (ref 26.6–33.0)
MCHC: 30.8 g/dL — ABNORMAL LOW (ref 31.5–35.7)
MCV: 72 fL — ABNORMAL LOW (ref 79–97)
Monocytes Absolute: 0.6 10*3/uL (ref 0.1–0.9)
Monocytes: 7 %
Neutrophils Absolute: 6.3 10*3/uL (ref 1.4–7.0)
Neutrophils: 71 %
Platelets: 426 10*3/uL (ref 150–450)
RBC: 4.93 x10E6/uL (ref 3.77–5.28)
RDW: 15.4 % (ref 11.7–15.4)
WBC: 8.7 10*3/uL (ref 3.4–10.8)

## 2020-03-14 LAB — HEMOGLOBIN A1C
Est. average glucose Bld gHb Est-mCnc: 97 mg/dL
Hgb A1c MFr Bld: 5 % (ref 4.8–5.6)

## 2020-03-14 LAB — LIPID PANEL WITH LDL/HDL RATIO
Cholesterol, Total: 150 mg/dL (ref 100–199)
HDL: 44 mg/dL (ref >39)
LDL Chol Calc (NIH): 89 mg/dL (ref 0–99)
LDL/HDL Ratio: 2 ratio (ref 0.0–3.2)
Triglycerides: 87 mg/dL (ref 0–149)
VLDL Cholesterol Cal: 17 mg/dL (ref 5–40)

## 2020-03-14 LAB — VITAMIN B12: Vitamin B-12: 615 pg/mL (ref 232–1245)

## 2020-03-14 LAB — VITAMIN D 25 HYDROXY (VIT D DEFICIENCY, FRACTURES): Vit D, 25-Hydroxy: 63.3 ng/mL (ref 30.0–100.0)

## 2020-03-14 LAB — T4, FREE: Free T4: 1.16 ng/dL (ref 0.82–1.77)

## 2020-03-14 LAB — T3: T3, Total: 144 ng/dL (ref 71–180)

## 2020-03-14 LAB — TSH: TSH: 1.75 u[IU]/mL (ref 0.450–4.500)

## 2020-03-14 LAB — INSULIN, RANDOM: INSULIN: 17.3 u[IU]/mL (ref 2.6–24.9)

## 2020-03-14 LAB — FOLATE: Folate: 14.5 ng/mL (ref >3.0)

## 2020-03-19 ENCOUNTER — Ambulatory Visit: Payer: No Typology Code available for payment source | Attending: Internal Medicine

## 2020-03-19 DIAGNOSIS — Z23 Encounter for immunization: Secondary | ICD-10-CM

## 2020-03-19 NOTE — Progress Notes (Signed)
   Covid-19 Vaccination Clinic  Name:  Heidi Robinson    MRN: [id number] DOB: [birth year]  03/19/2020  [name] was observed post Covid-19 immunization for 30 minutes based on pre-vaccination screening without incident. She was provided with Vaccine Information Sheet and instruction to access the V-Safe system.   [name] was instructed to call 911 with any severe reactions post vaccine: [name] Difficulty breathing  . Swelling of face and throat  . A fast heartbeat  . A bad rash all over body  . Dizziness and weakness   Immunizations Administered    Name Date Dose VIS Date Route   Pfizer COVID-19 Vaccine 03/19/2020 12:31 PM 0.3 mL 01/03/2019 Intramuscular   Manufacturer: [place]   Lot: [id number]   [place]: [id number]

## 2020-04-09 ENCOUNTER — Other Ambulatory Visit: Payer: Self-pay

## 2020-04-09 ENCOUNTER — Encounter (INDEPENDENT_AMBULATORY_CARE_PROVIDER_SITE_OTHER): Payer: Self-pay | Admitting: Family Medicine

## 2020-04-09 ENCOUNTER — Ambulatory Visit (INDEPENDENT_AMBULATORY_CARE_PROVIDER_SITE_OTHER): Payer: No Typology Code available for payment source | Admitting: Family Medicine

## 2020-04-09 VITALS — BP 118/87 | HR 109 | Temp 98.9°F | Ht 62.0 in | Wt 228.0 lb

## 2020-04-09 DIAGNOSIS — D508 Other iron deficiency anemias: Secondary | ICD-10-CM | POA: Diagnosis not present

## 2020-04-09 DIAGNOSIS — E8881 Metabolic syndrome: Secondary | ICD-10-CM | POA: Insufficient documentation

## 2020-04-09 DIAGNOSIS — F3289 Other specified depressive episodes: Secondary | ICD-10-CM

## 2020-04-09 DIAGNOSIS — E88819 Insulin resistance, unspecified: Secondary | ICD-10-CM | POA: Insufficient documentation

## 2020-04-09 DIAGNOSIS — Z9189 Other specified personal risk factors, not elsewhere classified: Secondary | ICD-10-CM | POA: Diagnosis not present

## 2020-04-09 DIAGNOSIS — E559 Vitamin D deficiency, unspecified: Secondary | ICD-10-CM

## 2020-04-09 DIAGNOSIS — Z6841 Body Mass Index (BMI) 40.0 and over, adult: Secondary | ICD-10-CM

## 2020-04-09 DIAGNOSIS — E66813 Obesity, class 3: Secondary | ICD-10-CM

## 2020-04-09 MED ORDER — FERROUS SULFATE 325 (65 FE) MG PO TABS: 325.0000 mg | ORAL_TABLET | Freq: Every day | ORAL | 0 refills | Status: DC

## 2020-04-09 MED ORDER — VITAMIN D (ERGOCALCIFEROL) 1.25 MG (50000 UNIT) PO CAPS: 50000.0000 [IU] | ORAL_CAPSULE | ORAL | 0 refills | Status: DC

## 2020-04-09 MED ORDER — BUPROPION HCL ER (SR) 200 MG PO TB12: 200.0000 mg | ORAL_TABLET | Freq: Two times a day (BID) | ORAL | 0 refills | Status: DC

## 2020-04-09 MED ORDER — METFORMIN HCL 500 MG PO TABS: 500.0000 mg | ORAL_TABLET | Freq: Three times a day (TID) | ORAL | 0 refills | Status: DC

## 2020-04-09 MED ORDER — TOPIRAMATE 50 MG PO TABS: 50.0000 mg | ORAL_TABLET | Freq: Every day | ORAL | 0 refills | Status: DC

## 2020-04-09 NOTE — Progress Notes (Signed)
Chief Complaint:   OBESITY Heidi Robinson is here to discuss her progress with her obesity treatment plan along with follow-up of her obesity related diagnoses. Heidi Robinson is on keeping a food journal and adhering to recommended goals of 1200-1500 calories and 85+ grams of protein daily and states she is following her eating plan approximately 10% of the time. Heidi Robinson states she is walking for 30 minutes 1-2 times per week.  Today's visit was #: 37 Starting weight: 258 lbs Starting date: 12/21/2017 Today's weight: 228 lbs Today's date: 04/09/2020 Total lbs lost to date: 30 Total lbs lost since last in-office visit: 7  Interim History: Heidi Robinson has done very well with weight loss since her last visit. She still struggles a bit with journaling especially when she is very busy. She notes decreased emotional eating.  Subjective:   1. Vitamin D deficiency Heidi Robinson's Vit D level is well controlled on Vit D, and is now at goal. I discussed labs with the patient today.  2. Other depression, with emotional eating Heidi Robinson started Topamax in addition to Wellbutrin, and she feels she has done better decreasing emotional eating especially in the PM. Changes are an added bonus, she feels she sleeps better.  3. Insulin resistance Heidi Robinson's A1c remains very well controlled on metformin and diet. She denies nausea, vomiting, or hypoglycemia. I discussed labs with the patient today.  4. Other iron deficiency anemia Heidi Robinson's hemoglobin is slowly improving, and is tolerating iron supplement well.  5. At risk for side effect of medication Heidi Robinson is at risk for drug side effects due to dysgeusia and Topamax.  Assessment/Plan:   1. Vitamin D deficiency Low Vitamin D level contributes to fatigue and are associated with obesity, breast, and colon cancer. We will refill prescription Vitamin D for 1 month. Heidi Robinson will follow-up for routine testing of Vitamin D, at least 2-3 times per year to avoid over-replacement.  - Vitamin D,  Ergocalciferol, (DRISDOL) 1.25 MG (50000 UNIT) CAPS capsule; Take 1 capsule (50,000 Units total) by mouth every 7 (seven) days.  Dispense: 4 capsule; Refill: 0  2. Other depression, with emotional eating Behavior modification techniques were discussed today to help Sua deal with her emotional/non-hunger eating behaviors. We will refill Wellbutrin and topiramate for 1 month. Orders and follow up as documented in patient record.   - buPROPion (WELLBUTRIN SR) 200 MG 12 hr tablet; Take 1 tablet (200 mg total) by mouth 2 (two) times daily.  Dispense: 60 tablet; Refill: 0 - topiramate (TOPAMAX) 50 MG tablet; Take 1 tablet (50 mg total) by mouth at bedtime.  Dispense: 30 tablet; Refill: 0  3. Insulin resistance Heidi Robinson will continue to work on weight loss, exercise, and decreasing simple carbohydrates to help decrease the risk of diabetes. We will refill metformin for 1 month. Heidi Robinson agreed to follow-up with [place] as directed to closely monitor her progress.  - metFORMIN (GLUCOPHAGE) 500 MG tablet; Take 1 tablet (500 mg total) by mouth 3 (three) times daily.  Dispense: 90 tablet; Refill: 0  4. Other iron deficiency anemia We will refill FeSo4 for 1 month. Orders and follow up as documented in patient record.  - ferrous sulfate 325 (65 FE) MG tablet; Take 1 tablet (325 mg total) by mouth daily with breakfast.  Dispense: 30 tablet; Refill: 0  5. At risk for side effect of medication Heidi Robinson was given approximately 15 minutes of drug side effect counseling today. We discussed side effect possibility and risk versus benefits. She is to avoid carbonated  liquids. Heidi Robinson agreed to the medication and will contact this office if these side effects are intolerable.  Repetitive spaced learning was employed today to elicit superior memory formation and behavioral change.  6. Class 3 severe obesity with serious comorbidity and body mass index (BMI) of 40.0 to 44.9 in adult, unspecified obesity type (HCC) Heidi Robinson is currently  in the action stage of change. As such, her goal is to continue with weight loss efforts. She has agreed to keeping a food journal and adhering to recommended goals of 1200-1500 calories and 85+ grams of protein daily.   Exercise goals: As is.  Behavioral modification strategies: increasing lean protein intake and emotional eating strategies.  [name] has agreed to follow-up with our clinic in 4 weeks. She was informed of the importance of frequent follow-up visits to maximize her success with intensive lifestyle modifications for her multiple health conditions.   Objective:   Blood pressure 118/87, pulse (!) 109, temperature 98.9 F (37.2 C), temperature source Oral, height 5\' 2"  (1.575 m), weight 228 lb (103.4 kg), last menstrual period 04/07/2020, SpO2 100 %. Body mass index is 41.7 kg/m.  General: Cooperative, alert, well developed, in no acute distress. HEENT: Conjunctivae and lids unremarkable. Cardiovascular: Regular rhythm.  Lungs: Normal work of breathing. Neurologic: No focal deficits.   Lab Results  Component Value Date   CREATININE 0.81 03/11/2020   BUN 10 03/11/2020   NA 140 03/11/2020   K 4.4 03/11/2020   CL 102 03/11/2020   CO2 23 03/11/2020   Lab Results  Component Value Date   ALT 8 03/11/2020   AST 14 03/11/2020   ALKPHOS 74 03/11/2020   BILITOT <0.2 03/11/2020   Lab Results  Component Value Date   HGBA1C 5.0 03/11/2020   HGBA1C 4.9 09/01/2019   HGBA1C 5.2 05/11/2018   HGBA1C 5.7 (H) 12/21/2017   HGBA1C 5.2 08/04/2017   Lab Results  Component Value Date   INSULIN 17.3 03/11/2020   INSULIN 13.1 09/01/2019   INSULIN 16.7 05/11/2018   INSULIN 23.4 12/21/2017   Lab Results  Component Value Date   TSH 1.750 03/11/2020   Lab Results  Component Value Date   CHOL 150 03/11/2020   HDL 44 03/11/2020   LDLCALC 89 03/11/2020   TRIG 87 03/11/2020   CHOLHDL 3.8 08/04/2017   Lab Results  Component Value Date   WBC 8.7 03/11/2020   HGB 11.0 (L)  03/11/2020   HCT 35.7 03/11/2020   MCV 72 (L) 03/11/2020   PLT 426 03/11/2020   Lab Results  Component Value Date   IRON 25 (L) 09/01/2019   TIBC 367 09/01/2019   FERRITIN 13 (L) 09/01/2019   Attestation Statements:   Reviewed by clinician on day of visit: allergies, medications, problem list, medical history, surgical history, family history, social history, and previous encounter notes.   I, Trixie Dredge, am acting as transcriptionist for Dennard Nip, MD.  I have reviewed the above documentation for accuracy and completeness, and I agree with the above. -  Dennard Nip, MD

## 2020-04-11 IMAGING — MG DIGITAL SCREENING BILAT W/ TOMO W/ CAD
8 series · 8 of 24 positions shown · non-contrast
Comparison: Previous exam(s).

CLINICAL DATA: Screening.

EXAM:
DIGITAL SCREENING BILATERAL MAMMOGRAM WITH TOMO AND CAD

[R CC synth-2D]
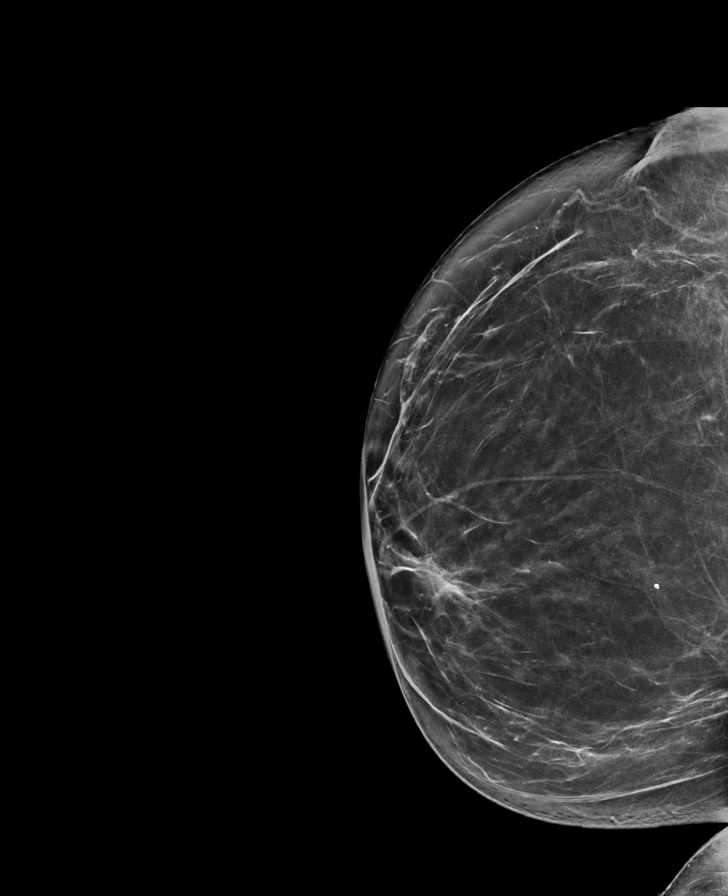

[L CC synth-2D]
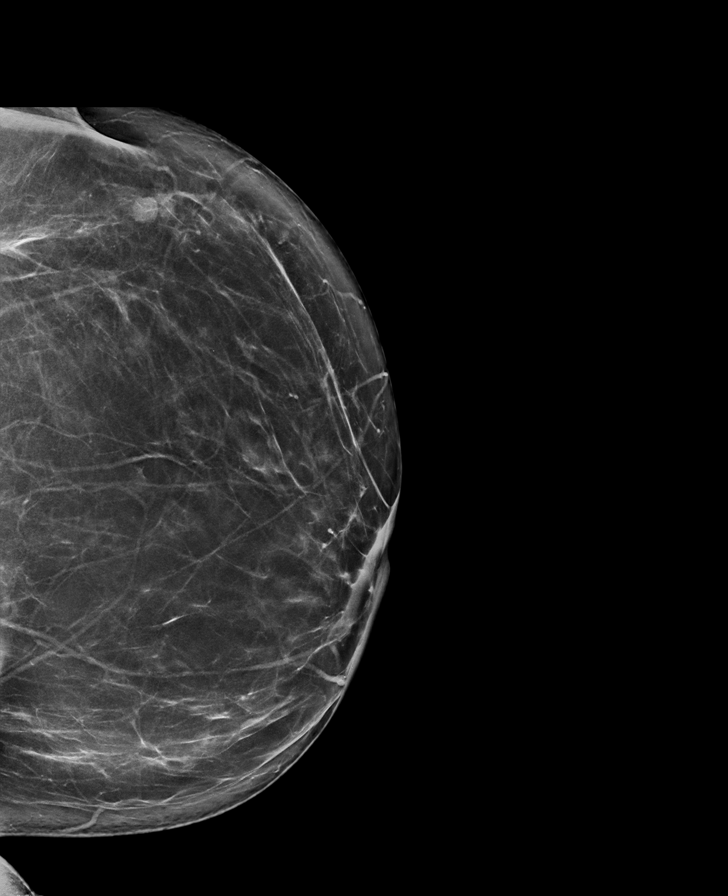

[R MLO synth-2D]
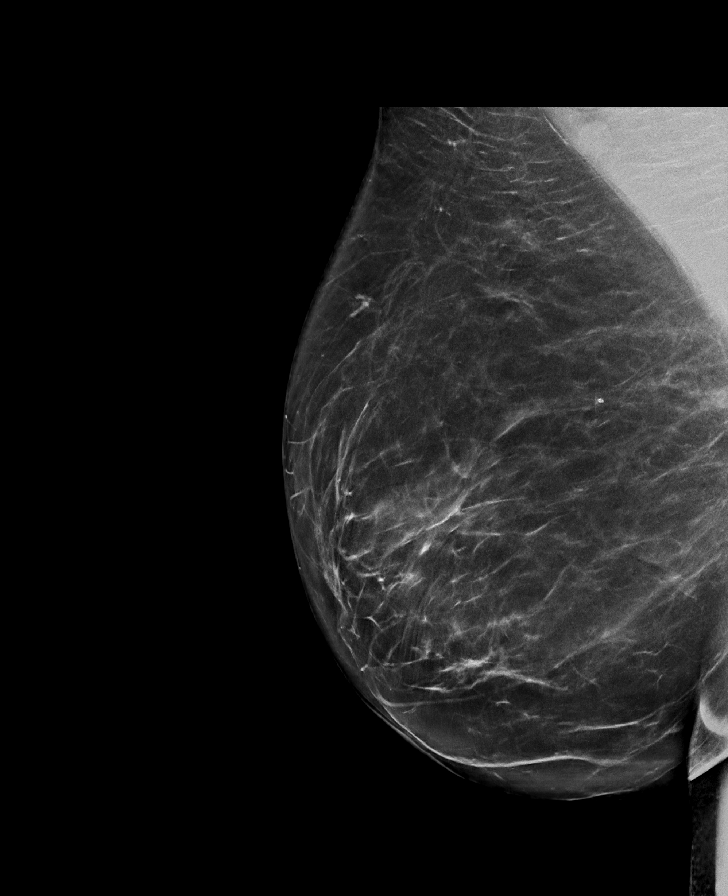

[L MLO synth-2D]
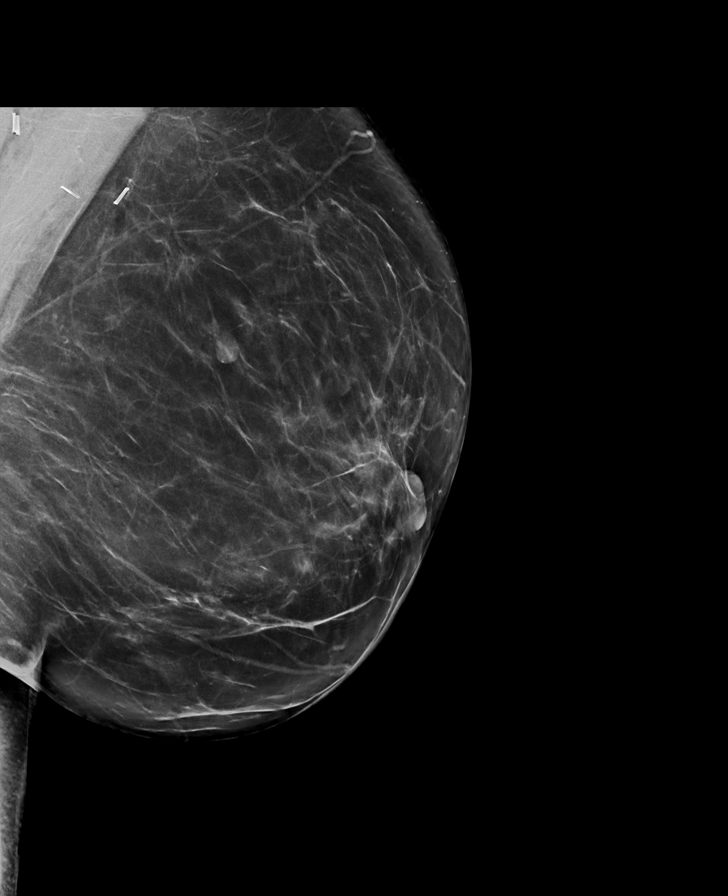

[R MLO tomo · tomo slice 45/90.0]
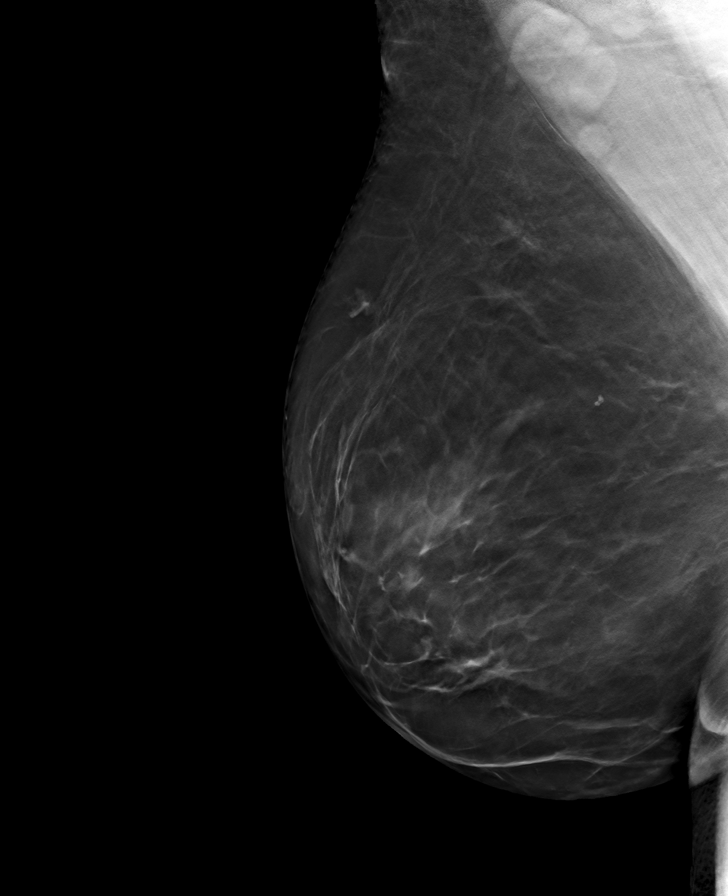

[R CC tomo · tomo slice 41/82.0]
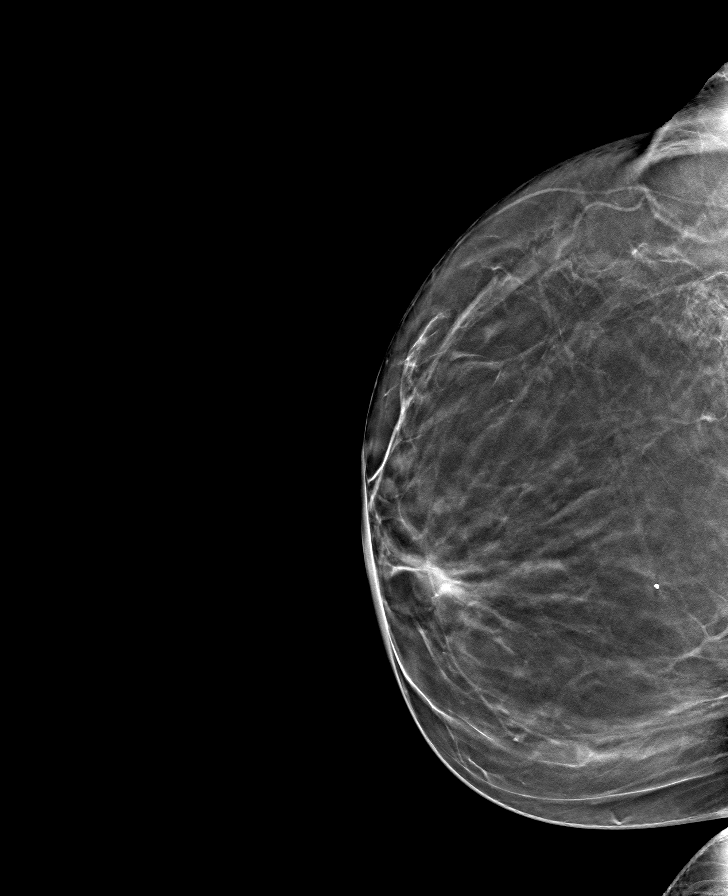

[L MLO tomo · tomo slice 47/93.0]
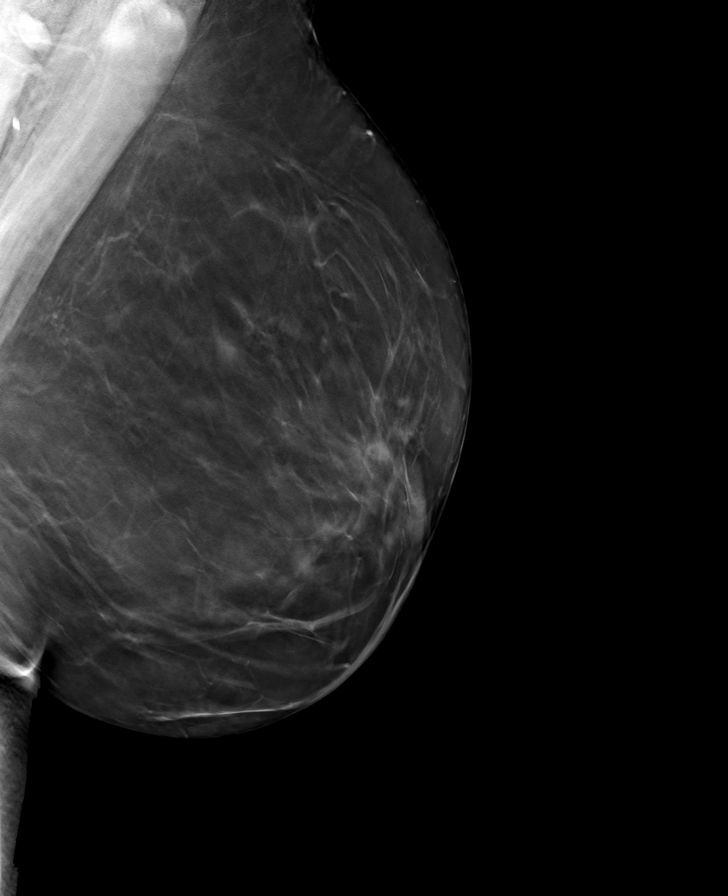

[L CC tomo · tomo slice 42/83.0]
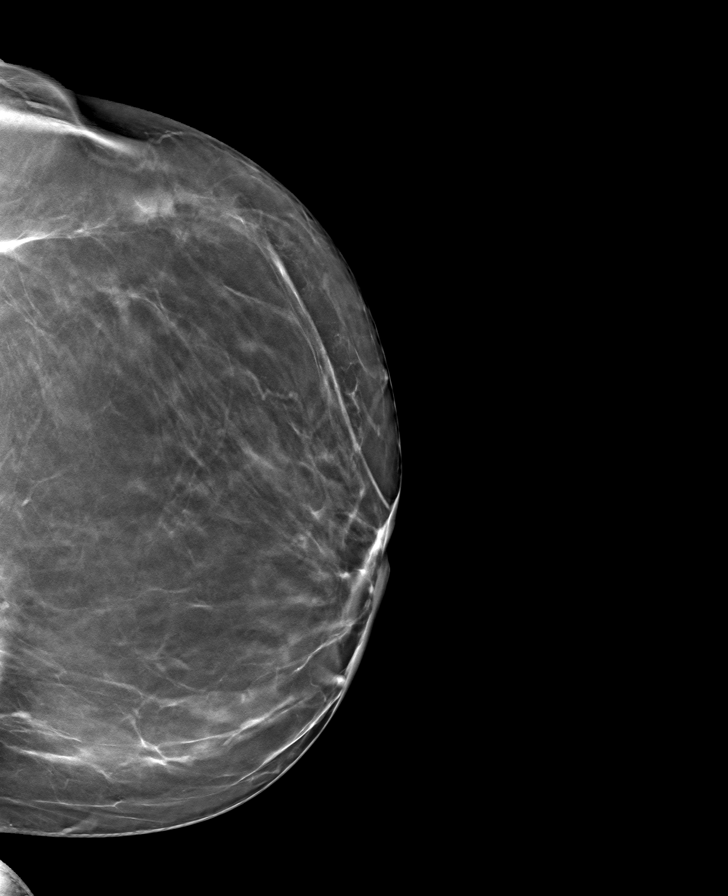

[8 of 24 positions shown; findings below may reference images not displayed]

ACR Breast Density Category b: There are scattered areas of
fibroglandular density.
FINDINGS: There are no findings suspicious for malignancy. Images were
processed with CAD.
IMPRESSION: No mammographic evidence of malignancy. A result letter of this
screening mammogram will be mailed directly to the patient.

RECOMMENDATION:
Screening mammogram in one year. (Code:[id number])

BI-RADS CATEGORY  1: Negative.

## 2020-04-17 ENCOUNTER — Telehealth (INDEPENDENT_AMBULATORY_CARE_PROVIDER_SITE_OTHER): Payer: No Typology Code available for payment source | Admitting: Registered Nurse

## 2020-04-17 VITALS — Ht 63.0 in | Wt 227.0 lb

## 2020-04-17 DIAGNOSIS — I10 Essential (primary) hypertension: Secondary | ICD-10-CM

## 2020-04-17 DIAGNOSIS — J4520 Mild intermittent asthma, uncomplicated: Secondary | ICD-10-CM | POA: Diagnosis not present

## 2020-04-17 MED ORDER — ALBUTEROL SULFATE HFA 108 (90 BASE) MCG/ACT IN AERS: 2.0000 | INHALATION_SPRAY | Freq: Four times a day (QID) | RESPIRATORY_TRACT | 3 refills | Status: DC | PRN

## 2020-04-17 MED ORDER — LISINOPRIL-HYDROCHLOROTHIAZIDE 10-12.5 MG PO TABS: 1.0000 | ORAL_TABLET | Freq: Every day | ORAL | 1 refills | Status: DC

## 2020-04-17 NOTE — Patient Instructions (Signed)
° ° ° °  If you have lab work done today you will be contacted with your lab results within the next 2 weeks.  If you have not heard from us then please contact us. The fastest way to get your results is to register for My Chart. ° ° °IF you received an x-ray today, you will receive an invoice from [facility]. Please contact [facility] at [phone] with questions or concerns regarding your invoice.  ° °IF you received labwork today, you will receive an invoice from [facility]. Please contact [facility] at [phone] with questions or concerns regarding your invoice.  ° °Our billing staff will not be able to assist you with questions regarding bills from these companies. ° °You will be contacted with the lab results as soon as they are available. The fastest way to get your results is to activate your My Chart account. Instructions are located on the last page of this paperwork. If you have not heard from us regarding the results in 2 weeks, please contact this office. °  ° ° ° °

## 2020-05-07 ENCOUNTER — Ambulatory Visit (INDEPENDENT_AMBULATORY_CARE_PROVIDER_SITE_OTHER): Payer: No Typology Code available for payment source | Admitting: Family Medicine

## 2020-05-07 ENCOUNTER — Other Ambulatory Visit: Payer: Self-pay

## 2020-05-07 ENCOUNTER — Encounter (INDEPENDENT_AMBULATORY_CARE_PROVIDER_SITE_OTHER): Payer: Self-pay | Admitting: Family Medicine

## 2020-05-07 VITALS — BP 107/71 | HR 89 | Temp 98.2°F | Ht 62.0 in | Wt 226.0 lb

## 2020-05-07 DIAGNOSIS — R7303 Prediabetes: Secondary | ICD-10-CM

## 2020-05-07 DIAGNOSIS — E66813 Obesity, class 3: Secondary | ICD-10-CM

## 2020-05-07 DIAGNOSIS — F3289 Other specified depressive episodes: Secondary | ICD-10-CM | POA: Diagnosis not present

## 2020-05-07 DIAGNOSIS — D508 Other iron deficiency anemias: Secondary | ICD-10-CM

## 2020-05-07 DIAGNOSIS — Z6841 Body Mass Index (BMI) 40.0 and over, adult: Secondary | ICD-10-CM

## 2020-05-07 DIAGNOSIS — E559 Vitamin D deficiency, unspecified: Secondary | ICD-10-CM

## 2020-05-07 MED ORDER — FERROUS SULFATE 325 (65 FE) MG PO TABS: 325.0000 mg | ORAL_TABLET | Freq: Every day | ORAL | 0 refills | Status: DC

## 2020-05-07 MED ORDER — VITAMIN D (ERGOCALCIFEROL) 1.25 MG (50000 UNIT) PO CAPS: 50000.0000 [IU] | ORAL_CAPSULE | ORAL | 0 refills | Status: DC

## 2020-05-07 MED ORDER — BUPROPION HCL ER (SR) 200 MG PO TB12: 200.0000 mg | ORAL_TABLET | Freq: Two times a day (BID) | ORAL | 0 refills | Status: DC

## 2020-05-07 MED ORDER — TOPIRAMATE 50 MG PO TABS: 50.0000 mg | ORAL_TABLET | Freq: Every day | ORAL | 0 refills | Status: DC

## 2020-05-07 MED ORDER — METFORMIN HCL 500 MG PO TABS: 500.0000 mg | ORAL_TABLET | Freq: Three times a day (TID) | ORAL | 0 refills | Status: DC

## 2020-05-08 NOTE — Progress Notes (Signed)
Chief Complaint:   OBESITY Heidi Robinson is here to discuss her progress with her obesity treatment plan along with follow-up of her obesity related diagnoses. Heidi Robinson is on keeping a food journal and adhering to recommended goals of 1200-1500 calories and 85+ grams of protein daily and states she is following her eating plan approximately 0% of the time. Heidi Robinson states she is doing 0 minutes 0 times per week.  Today's visit was #: 98 Starting weight: 258 lbs Starting date: 12/21/2017 Today's weight: 226 lbs Today's date: 05/07/2020 Total lbs lost to date: 32 Total lbs lost since last in-office visit: 2  Interim History: Alexys notes her appetite has decreased but this appears to be due to stress. She is eating less but not able to meet her nutritional criteria.  Subjective:   1. Pre-diabetes Heidi Robinson is stable on metformin, and she denies nausea, vomiting, or hypoglycemia.  2. Other iron deficiency anemia Heidi Robinson is stable on iron, and she denies GI upset.  3. Vitamin D deficiency Heidi Robinson is stable on Vit D, and last Vit D level was at goal.  4. Other depression, with emotional eating Heidi Robinson's mood has decreased with increased work stress, and worrying about her children her appetite has decreased in a unhealthy way. She has not done therapy in the past.  Assessment/Plan:   1. Pre-diabetes Heidi Robinson will continue to work on weight loss, exercise, and decreasing simple carbohydrates to help decrease the risk of diabetes. We will refill metformin for 1 month.  - metFORMIN (GLUCOPHAGE) 500 MG tablet; Take 1 tablet (500 mg total) by mouth 3 (three) times daily.  Dispense: 90 tablet; Refill: 0  2. Other iron deficiency anemia Heidi Robinson will continue Feso4 and we will refill for 1 month. Orders and follow up as documented in patient record.  - ferrous sulfate 325 (65 FE) MG tablet; Take 1 tablet (325 mg total) by mouth daily with breakfast.  Dispense: 30 tablet; Refill: 0  3. Vitamin D deficiency Low Vitamin D  level contributes to fatigue and are associated with obesity, breast, and colon cancer. We will refill prescription Vitamin D for 1 month. Heidi Robinson will follow-up for routine testing of Vitamin D, at least 2-3 times per year to avoid over-replacement.  - Vitamin D, Ergocalciferol, (DRISDOL) 1.25 MG (50000 UNIT) CAPS capsule; Take 1 capsule (50,000 Units total) by mouth every 7 (seven) days.  Dispense: 4 capsule; Refill: 0  4. Other depression, with emotional eating Behavior modification techniques were discussed today to help Heidi Robinson deal with her emotional/non-hunger eating behaviors. We will refill Wellbutrin SR and Topamax for 1 month. Heidi Robinson was given the mental health option handout and was encouraged to start therapy. Orders and follow up as documented in patient record.   - buPROPion (WELLBUTRIN SR) 200 MG 12 hr tablet; Take 1 tablet (200 mg total) by mouth 2 (two) times daily.  Dispense: 60 tablet; Refill: 0 - topiramate (TOPAMAX) 50 MG tablet; Take 1 tablet (50 mg total) by mouth at bedtime.  Dispense: 30 tablet; Refill: 0  5. Class 3 severe obesity with serious comorbidity and body mass index (BMI) of 40.0 to 44.9 in adult, unspecified obesity type (HCC) Heidi Robinson is currently in the action stage of change. As such, her goal is to continue with weight loss efforts. She has agreed to the Category 2 Plan.   Heidi Robinson is to drink protein shakes for now to improve her nutrition and cycle back to "real food" when she is able.  Behavioral modification strategies:  no skipping meals and meal planning and cooking strategies.  Heidi Robinson has agreed to follow-up with our clinic in 2 weeks. She was informed of the importance of frequent follow-up visits to maximize her success with intensive lifestyle modifications for her multiple health conditions.   Objective:   Blood pressure 107/71, pulse 89, temperature 98.2 F (36.8 C), temperature source Oral, height 5\' 2"  (1.575 m), weight 226 lb (102.5 kg), last menstrual  period 05/01/2020, SpO2 100 %. Body mass index is 41.34 kg/m.  General: Cooperative, alert, well developed, in no acute distress. HEENT: Conjunctivae and lids unremarkable. Cardiovascular: Regular rhythm.  Lungs: Normal work of breathing. Neurologic: No focal deficits.   Lab Results  Component Value Date   CREATININE 0.81 03/11/2020   BUN 10 03/11/2020   NA 140 03/11/2020   K 4.4 03/11/2020   CL 102 03/11/2020   CO2 23 03/11/2020   Lab Results  Component Value Date   ALT 8 03/11/2020   AST 14 03/11/2020   ALKPHOS 74 03/11/2020   BILITOT <0.2 03/11/2020   Lab Results  Component Value Date   HGBA1C 5.0 03/11/2020   HGBA1C 4.9 09/01/2019   HGBA1C 5.2 05/11/2018   HGBA1C 5.7 (H) 12/21/2017   HGBA1C 5.2 08/04/2017   Lab Results  Component Value Date   INSULIN 17.3 03/11/2020   INSULIN 13.1 09/01/2019   INSULIN 16.7 05/11/2018   INSULIN 23.4 12/21/2017   Lab Results  Component Value Date   TSH 1.750 03/11/2020   Lab Results  Component Value Date   CHOL 150 03/11/2020   HDL 44 03/11/2020   LDLCALC 89 03/11/2020   TRIG 87 03/11/2020   CHOLHDL 3.8 08/04/2017   Lab Results  Component Value Date   WBC 8.7 03/11/2020   HGB 11.0 (L) 03/11/2020   HCT 35.7 03/11/2020   MCV 72 (L) 03/11/2020   PLT 426 03/11/2020   Lab Results  Component Value Date   IRON 25 (L) 09/01/2019   TIBC 367 09/01/2019   FERRITIN 13 (L) 09/01/2019   Attestation Statements:   Reviewed by clinician on day of visit: allergies, medications, problem list, medical history, surgical history, family history, social history, and previous encounter notes.  Time spent on visit including pre-visit chart review and post-visit care and charting was 41 minutes.    I, Trixie Dredge, am acting as transcriptionist for Dennard Nip, MD.  I have reviewed the above documentation for accuracy and completeness, and I agree with the above. -  Dennard Nip, MD

## 2020-06-04 ENCOUNTER — Encounter (INDEPENDENT_AMBULATORY_CARE_PROVIDER_SITE_OTHER): Payer: Self-pay | Admitting: Family Medicine

## 2020-06-04 ENCOUNTER — Other Ambulatory Visit: Payer: Self-pay

## 2020-06-04 ENCOUNTER — Ambulatory Visit (INDEPENDENT_AMBULATORY_CARE_PROVIDER_SITE_OTHER): Payer: No Typology Code available for payment source | Admitting: Family Medicine

## 2020-06-04 VITALS — BP 115/60 | HR 94 | Temp 98.1°F | Ht 62.0 in | Wt 224.0 lb

## 2020-06-04 DIAGNOSIS — R7303 Prediabetes: Secondary | ICD-10-CM | POA: Diagnosis not present

## 2020-06-04 DIAGNOSIS — D508 Other iron deficiency anemias: Secondary | ICD-10-CM | POA: Diagnosis not present

## 2020-06-04 DIAGNOSIS — Z9189 Other specified personal risk factors, not elsewhere classified: Secondary | ICD-10-CM

## 2020-06-04 DIAGNOSIS — E66813 Obesity, class 3: Secondary | ICD-10-CM

## 2020-06-04 DIAGNOSIS — E559 Vitamin D deficiency, unspecified: Secondary | ICD-10-CM

## 2020-06-04 DIAGNOSIS — F3289 Other specified depressive episodes: Secondary | ICD-10-CM

## 2020-06-04 DIAGNOSIS — Z6841 Body Mass Index (BMI) 40.0 and over, adult: Secondary | ICD-10-CM

## 2020-06-04 MED ORDER — TOPIRAMATE 50 MG PO TABS: 50.0000 mg | ORAL_TABLET | Freq: Every day | ORAL | 0 refills | Status: DC

## 2020-06-04 MED ORDER — VITAMIN D (ERGOCALCIFEROL) 1.25 MG (50000 UNIT) PO CAPS: 50000.0000 [IU] | ORAL_CAPSULE | ORAL | 0 refills | Status: DC

## 2020-06-04 MED ORDER — METFORMIN HCL 500 MG PO TABS: 500.0000 mg | ORAL_TABLET | Freq: Three times a day (TID) | ORAL | 0 refills | Status: DC

## 2020-06-04 MED ORDER — BUPROPION HCL ER (SR) 200 MG PO TB12: 200.0000 mg | ORAL_TABLET | Freq: Two times a day (BID) | ORAL | 0 refills | Status: DC

## 2020-06-04 MED ORDER — FERROUS SULFATE 325 (65 FE) MG PO TABS: 325.0000 mg | ORAL_TABLET | Freq: Every day | ORAL | 0 refills | Status: DC

## 2020-06-04 NOTE — Progress Notes (Signed)
Chief Complaint:   OBESITY Heidi Robinson is here to discuss her progress with her obesity treatment plan along with follow-up of her obesity related diagnoses. Heidi Robinson is on the Category 2 Plan and states she is following her eating plan approximately 20% of the time. Heidi Robinson states she is doing 0 minutes 0 times per week.  Today's visit was #: 55 Starting weight: 258 lbs Starting date: 12/21/2017 Today's weight: 224 lbs Today's date: 06/04/2020 Total lbs lost to date: 34 Total lbs lost since last in-office visit: 2  Interim History: Heidi Robinson continues to do well with weight loss. Her hunger is controlled but she struggles to eat all of her protein at times. She is going on vacation next week and she will try to minimize holiday weight gain.  Subjective:   1. Vitamin D deficiency Heidi Robinson is stable on Vit D, and she is due for labs next month.  2. Pre-diabetes Heidi Robinson is stable on her medications, and she is working on diet and weight loss.  3. Other iron deficiency anemia Heidi Robinson is stable on iron supplementation, and she is due for labs soon.  4. Other depression, with emotional eating Heidi Robinson is stable on her medications, and her blood pressure is controlled. She denies worsening insomnia.  5. At risk for impaired metabolic function Heidi Robinson is at increased risk for impaired metabolic function due to decreased protein.  Assessment/Plan:   1. Vitamin D deficiency Low Vitamin D level contributes to fatigue and are associated with obesity, breast, and colon cancer. We will refill prescription Vitamin D for 1 month. Justyna will follow-up for routine testing of Vitamin D, at least 2-3 times per year to avoid over-replacement. We will recheck labs next month.  - Vitamin D, Ergocalciferol, (DRISDOL) 1.25 MG (50000 UNIT) CAPS capsule; Take 1 capsule (50,000 Units total) by mouth every 7 (seven) days.  Dispense: 4 capsule; Refill: 0  2. Pre-diabetes Heidi Robinson will continue to work on weight loss, exercise, and  decreasing simple carbohydrates to help decrease the risk of diabetes. We will refill metformin for 1 month, and we will recheck labs next month.  - metFORMIN (GLUCOPHAGE) 500 MG tablet; Take 1 tablet (500 mg total) by mouth 3 (three) times daily.  Dispense: 90 tablet; Refill: 0  3. Other iron deficiency anemia Heidi Robinson will continue ferrous sulfate, and we will refill for 1 month. We will recheck labs next month. Orders and follow up as documented in patient record.  - ferrous sulfate 325 (65 FE) MG tablet; Take 1 tablet (325 mg total) by mouth daily with breakfast.  Dispense: 30 tablet; Refill: 0  4. Other depression, with emotional eating Behavior modification techniques were discussed today to help Heidi Robinson deal with her emotional/non-hunger eating behaviors. We will refill Topamax and Wellbutrin SR and we will refill for 1 month. Orders and follow up as documented in patient record.   - buPROPion (WELLBUTRIN SR) 200 MG 12 hr tablet; Take 1 tablet (200 mg total) by mouth 2 (two) times daily.  Dispense: 60 tablet; Refill: 0 - topiramate (TOPAMAX) 50 MG tablet; Take 1 tablet (50 mg total) by mouth at bedtime.  Dispense: 30 tablet; Refill: 0  5. At risk for impaired metabolic function Heidi Robinson was given approximately 15 minutes of impaired  metabolic function prevention counseling today. We discussed intensive lifestyle modifications today with an emphasis on specific nutrition and exercise instructions and strategies.   Repetitive spaced learning was employed today to elicit superior memory formation and behavioral change.  6.  Class 3 severe obesity with serious comorbidity and body mass index (BMI) of 40.0 to 44.9 in adult, unspecified obesity type (HCC) Heidi Robinson is currently in the action stage of change. As such, her goal is to continue with weight loss efforts. She has agreed to keeping a food journal and adhering to recommended goals of 1200-1300 calories and 75+ grams of protein daily.   Behavioral  modification strategies: travel eating strategies, holiday eating strategies  and celebration eating strategies.  Heidi Robinson has agreed to follow-up with our clinic in 3 to 4 weeks. She was informed of the importance of frequent follow-up visits to maximize her success with intensive lifestyle modifications for her multiple health conditions.   Objective:   Blood pressure (!) 115/60, pulse 94, temperature 98.1 F (36.7 C), temperature source Oral, height 5\' 2"  (1.575 m), weight (!) 224 lb (101.6 kg), last menstrual period 05/25/2020, SpO2 99 %. Body mass index is 40.97 kg/m.  General: Cooperative, alert, well developed, in no acute distress. HEENT: Conjunctivae and lids unremarkable. Cardiovascular: Regular rhythm.  Lungs: Normal work of breathing. Neurologic: No focal deficits.   Lab Results  Component Value Date   CREATININE 0.81 03/11/2020   BUN 10 03/11/2020   NA 140 03/11/2020   K 4.4 03/11/2020   CL 102 03/11/2020   CO2 23 03/11/2020   Lab Results  Component Value Date   ALT 8 03/11/2020   AST 14 03/11/2020   ALKPHOS 74 03/11/2020   BILITOT <0.2 03/11/2020   Lab Results  Component Value Date   HGBA1C 5.0 03/11/2020   HGBA1C 4.9 09/01/2019   HGBA1C 5.2 05/11/2018   HGBA1C 5.7 (H) 12/21/2017   HGBA1C 5.2 08/04/2017   Lab Results  Component Value Date   INSULIN 17.3 03/11/2020   INSULIN 13.1 09/01/2019   INSULIN 16.7 05/11/2018   INSULIN 23.4 12/21/2017   Lab Results  Component Value Date   TSH 1.750 03/11/2020   Lab Results  Component Value Date   CHOL 150 03/11/2020   HDL 44 03/11/2020   LDLCALC 89 03/11/2020   TRIG 87 03/11/2020   CHOLHDL 3.8 08/04/2017   Lab Results  Component Value Date   WBC 8.7 03/11/2020   HGB 11.0 (L) 03/11/2020   HCT 35.7 03/11/2020   MCV 72 (L) 03/11/2020   PLT 426 03/11/2020   Lab Results  Component Value Date   IRON 25 (L) 09/01/2019   TIBC 367 09/01/2019   FERRITIN 13 (L) 09/01/2019   Attestation Statements:    Reviewed by clinician on day of visit: allergies, medications, problem list, medical history, surgical history, family history, social history, and previous encounter notes.   I, Trixie Dredge, am acting as transcriptionist for Dennard Nip, MD.  I have reviewed the above documentation for accuracy and completeness, and I agree with the above. -  Dennard Nip, MD

## 2020-07-01 ENCOUNTER — Ambulatory Visit (INDEPENDENT_AMBULATORY_CARE_PROVIDER_SITE_OTHER): Payer: No Typology Code available for payment source | Admitting: Family Medicine

## 2020-07-01 ENCOUNTER — Encounter (INDEPENDENT_AMBULATORY_CARE_PROVIDER_SITE_OTHER): Payer: Self-pay | Admitting: Family Medicine

## 2020-07-01 ENCOUNTER — Other Ambulatory Visit: Payer: Self-pay

## 2020-07-01 VITALS — BP 124/73 | HR 99 | Temp 98.4°F | Ht 62.0 in | Wt 224.0 lb

## 2020-07-01 DIAGNOSIS — R7303 Prediabetes: Secondary | ICD-10-CM

## 2020-07-01 DIAGNOSIS — Z6841 Body Mass Index (BMI) 40.0 and over, adult: Secondary | ICD-10-CM

## 2020-07-01 DIAGNOSIS — E559 Vitamin D deficiency, unspecified: Secondary | ICD-10-CM | POA: Diagnosis not present

## 2020-07-01 DIAGNOSIS — D508 Other iron deficiency anemias: Secondary | ICD-10-CM | POA: Diagnosis not present

## 2020-07-01 DIAGNOSIS — F3289 Other specified depressive episodes: Secondary | ICD-10-CM

## 2020-07-01 MED ORDER — FERROUS SULFATE 325 (65 FE) MG PO TABS: 325.0000 mg | ORAL_TABLET | Freq: Every day | ORAL | 0 refills | Status: DC

## 2020-07-01 MED ORDER — METFORMIN HCL 500 MG PO TABS: 500.0000 mg | ORAL_TABLET | Freq: Three times a day (TID) | ORAL | 0 refills | Status: DC

## 2020-07-01 MED ORDER — VITAMIN D (ERGOCALCIFEROL) 1.25 MG (50000 UNIT) PO CAPS: 50000.0000 [IU] | ORAL_CAPSULE | ORAL | 0 refills | Status: DC

## 2020-07-01 MED ORDER — BUPROPION HCL ER (SR) 200 MG PO TB12: 200.0000 mg | ORAL_TABLET | Freq: Two times a day (BID) | ORAL | 0 refills | Status: DC

## 2020-07-01 MED ORDER — TOPIRAMATE 50 MG PO TABS: 50.0000 mg | ORAL_TABLET | Freq: Every day | ORAL | 0 refills | Status: DC

## 2020-07-01 NOTE — Progress Notes (Signed)
Chief Complaint:   OBESITY Heidi Robinson is here to discuss her progress with her obesity treatment plan along with follow-up of her obesity related diagnoses. Heidi Robinson is on keeping a food journal and adhering to recommended goals of 1200-1300 calories and 75+ grams of protein daily and states she is following her eating plan approximately 25% of the time. Heidi Robinson states she is walking for 30 minutes 3 times per week.  Today's visit was #: 5 Starting weight: 258 lbs Starting date: 12/21/2017 Today's weight: 224 lbs Today's date: 07/01/2020 Total lbs lost to date: 34 Total lbs lost since last in-office visit: 0  Interim History: Heidi Robinson continues to do well maintaining her weight. School has started and she feels she will do better in the Fall with meal planning and scheduling time for herself.  Subjective:   1. Vitamin D deficiency Heidi Robinson is stable on Vit D, and denies nausea or vomiting.  2. Pre-diabetes Heidi Robinson is working on diet and weight loss, and is tolerating metformin.  She denies nausea or vomiting.  3. Other iron deficiency anemia Heidi Robinson is stable on FeSo4, and she denies palpitations or feeling lightheaded.  4. Other depression Heidi Robinson's mood is stable on her medications. She isn't sl]eeping as well and she feels this may be stress related with her job.  Assessment/Plan:   1. Vitamin D deficiency Low Vitamin D level contributes to fatigue and are associated with obesity, breast, and colon cancer. We will refill prescription Vitamin D for 1 month. Heidi Robinson will follow-up for routine testing of Vitamin D, at least 2-3 times per year to avoid over-replacement. We will recheck labs next month.  - Vitamin D, Ergocalciferol, (DRISDOL) 1.25 MG (50000 UNIT) CAPS capsule; Take 1 capsule (50,000 Units total) by mouth every 7 (seven) days.  Dispense: 4 capsule; Refill: 0  2. Pre-diabetes Heidi Robinson will continue to work on weight loss, exercise, and decreasing simple carbohydrates to help decrease the risk of  diabetes. We will refill metformin for 1 month.  - metFORMIN (GLUCOPHAGE) 500 MG tablet; Take 1 tablet (500 mg total) by mouth 3 (three) times daily.  Dispense: 90 tablet; Refill: 0  3. Other iron deficiency anemia We will refill FeSo4 for 1 month, and we will recheck labs in 1 month. Orders and follow up as documented in patient record.  Counseling . Iron is essential for our bodies to make red blood cells.  Reasons that someone may be deficient include: an iron-deficient diet (more likely in those following vegan or vegetarian diets), women with heavy menses, patients with GI disorders or poor absorption, patients that have had bariatric surgery, frequent blood donors, patients with cancer, and patients with heart disease.   Heidi Robinson foods include dark leafy greens, red and white meats, eggs, seafood, and beans.   . Certain foods and drinks prevent your body from absorbing iron properly. Avoid eating these foods in the same meal as iron-rich foods or with iron supplements. These foods include: coffee, black tea, and red wine; milk, dairy products, and foods that are high in calcium; beans and soybeans; whole grains.  . Constipation can be a side effect of iron supplementation. Increased water and fiber intake are helpful. Water goal: > 2 liters/day. Fiber goal: > 25 grams/day.  - ferrous sulfate 325 (65 FE) MG tablet; Take 1 tablet (325 mg total) by mouth daily with breakfast.  Dispense: 30 tablet; Refill: 0  4. Other depression Behavior modification techniques were discussed today to help Heidi Robinson deal with her  emotional/non-hunger eating behaviors. We will refill Topamax and Wellbutrin for 1 month. Heidi Robinson is to work on increasing sleep to at least 6 hours per night. Orders and follow up as documented in patient record.   - topiramate (TOPAMAX) 50 MG tablet; Take 1 tablet (50 mg total) by mouth at bedtime.  Dispense: 30 tablet; Refill: 0  5. Class 3 severe obesity with serious comorbidity and  body mass index (BMI) of 40.0 to 44.9 in adult, unspecified obesity type (HCC) Heidi Robinson is currently in the action stage of change. As such, her goal is to continue with weight loss efforts. She has agreed to keeping a food journal and adhering to recommended goals of 1200-1300 calories and 75+ grams of protein daily.   Exercise goals: As is.  Behavioral modification strategies: increasing lean protein intake.  Heidi Robinson has agreed to follow-up with our clinic in 4 weeks. She was informed of the importance of frequent follow-up visits to maximize her success with intensive lifestyle modifications for her multiple health conditions.   Objective:   Blood pressure 124/73, pulse 99, temperature 98.4 F (36.9 C), temperature source Oral, height 5\' 2"  (1.575 m), weight 224 lb (101.6 kg), SpO2 100 %. Body mass index is 40.97 kg/m.  General: Cooperative, alert, well developed, in no acute distress. HEENT: Conjunctivae and lids unremarkable. Cardiovascular: Regular rhythm.  Lungs: Normal work of breathing. Neurologic: No focal deficits.   Lab Results  Component Value Date   CREATININE 0.81 03/11/2020   BUN 10 03/11/2020   NA 140 03/11/2020   K 4.4 03/11/2020   CL 102 03/11/2020   CO2 23 03/11/2020   Lab Results  Component Value Date   ALT 8 03/11/2020   AST 14 03/11/2020   ALKPHOS 74 03/11/2020   BILITOT <0.2 03/11/2020   Lab Results  Component Value Date   HGBA1C 5.0 03/11/2020   HGBA1C 4.9 09/01/2019   HGBA1C 5.2 05/11/2018   HGBA1C 5.7 (H) 12/21/2017   HGBA1C 5.2 08/04/2017   Lab Results  Component Value Date   INSULIN 17.3 03/11/2020   INSULIN 13.1 09/01/2019   INSULIN 16.7 05/11/2018   INSULIN 23.4 12/21/2017   Lab Results  Component Value Date   TSH 1.750 03/11/2020   Lab Results  Component Value Date   CHOL 150 03/11/2020   HDL 44 03/11/2020   LDLCALC 89 03/11/2020   TRIG 87 03/11/2020   CHOLHDL 3.8 08/04/2017   Lab Results  Component Value Date   WBC 8.7  03/11/2020   HGB 11.0 (L) 03/11/2020   HCT 35.7 03/11/2020   MCV 72 (L) 03/11/2020   PLT 426 03/11/2020   Lab Results  Component Value Date   IRON 25 (L) 09/01/2019   TIBC 367 09/01/2019   FERRITIN 13 (L) 09/01/2019   Attestation Statements:   Reviewed by clinician on day of visit: allergies, medications, problem list, medical history, surgical history, family history, social history, and previous encounter notes.   I, Trixie Dredge, am acting as transcriptionist for Dennard Nip, MD.  I have reviewed the above documentation for accuracy and completeness, and I agree with the above. -  Dennard Nip, MD

## 2020-07-29 NOTE — Progress Notes (Signed)
Telemedicine Encounter- SOAP NOTE Established Patient  This telephone encounter was conducted with the patient's (or proxy's) verbal consent via audio telecommunications: yes  Patient was instructed to have this encounter in a suitably private space; and to only have persons present to whom they give permission to participate. In addition, patient identity was confirmed by use of name plus two identifiers (DOB and address).  I discussed the limitations, risks, security and privacy concerns of performing an evaluation and management service by telephone and the availability of in person appointments. I also discussed with the patient that there may be a patient responsible charge related to this service. The patient expressed understanding and agreed to proceed.  I spent a total of 13 minutes talking with the patient or their proxy.  Chief Complaint  Patient presents with  . Medication Refill    Subjective   Heidi Robinson is a 44 y.o. established patient. Telephone visit today for med refill  HPI Needs lisinopril-hctz and albuterol   Asthma: managed well. Uses albuterol PRN - not daily  HTN: checks at home. Wnl. Managed well with zestoretic. No complaints about AEs. No CV symptoms  Feeling well overall and is without complaint  Patient Active Problem List   Diagnosis Date Noted  . Insulin resistance 04/09/2020  . Depression 04/25/2019  . Class 3 severe obesity with serious comorbidity and body mass index (BMI) of 40.0 to 44.9 in adult ([place]) 04/25/2019  . Chronic bilateral thoracic back pain 09/16/2018  . Neck pain 09/16/2018  . Symptomatic mammary hypertrophy 09/16/2018  . Other constipation 01/18/2018  . Vitamin D deficiency 01/04/2018  . Anemia 01/04/2018  . Prediabetes 01/04/2018  . Other fatigue 12/21/2017  . Shortness of breath on exertion 12/21/2017  . Essential hypertension 12/21/2017  . Cough 11/06/2017  . Mild intermittent asthma without complication 76/16/0737    . Lower resp. tract infection 11/06/2017  . Iron deficiency anemia due to chronic blood loss 08/03/2017  . Pure hypercholesterolemia 08/03/2017  . Sickle cell trait ([place]) 06/12/2014  . Obesity (BMI 35.0-39.9 without comorbidity) 05/23/2013  . HTN (hypertension) 02/02/2012    Past Medical History:  Diagnosis Date  . Allergy   . Anemia   . Arthritis   . Asthma   . Fibroids   . Hypertension   . Iron deficiency anemia due to chronic blood loss   . Obesity (BMI 30-39.9) 02/02/2012  . Sickle cell trait ([place]) 06/04/2014   hemoglobin electrophoresis confirmed    Current Outpatient Medications  Medication Sig Dispense Refill  . albuterol (PROVENTIL) (2.5 MG/3ML) 0.083% nebulizer solution Inhale 2.5 mg into the lungs every 6 (six) hours as needed for wheezing or shortness of breath.    . lisinopril-hydrochlorothiazide (ZESTORETIC) 10-12.5 MG tablet Take 1 tablet by mouth daily. Take 1 tablet by mouth daily. 90 tablet 1  . meloxicam (MOBIC) 15 MG tablet Take 1 tablet (15 mg total) by mouth daily as needed for pain. 30 tablet 5  . Multiple Vitamin (MULTIVITAMINS PO) Take by mouth.    . polyethylene glycol powder (GLYCOLAX/MIRALAX) 17 GM/SCOOP powder Take 17 g by mouth daily. 3350 g 0  . albuterol (VENTOLIN HFA) 108 (90 Base) MCG/ACT inhaler Inhale 2 puffs into the lungs every 6 (six) hours as needed for wheezing or shortness of breath. 18 g 3  . buPROPion (WELLBUTRIN SR) 200 MG 12 hr tablet Take 1 tablet (200 mg total) by mouth 2 (two) times daily. 60 tablet 0  . ferrous sulfate 325 (65  FE) MG tablet Take 1 tablet (325 mg total) by mouth daily with breakfast. 30 tablet 0  . metFORMIN (GLUCOPHAGE) 500 MG tablet Take 1 tablet (500 mg total) by mouth 3 (three) times daily. 90 tablet 0  . topiramate (TOPAMAX) 50 MG tablet Take 1 tablet (50 mg total) by mouth at bedtime. 30 tablet 0  . Vitamin D, Ergocalciferol, (DRISDOL) 1.25 MG (50000 UNIT) CAPS capsule Take 1 capsule (50,000 Units total) by  mouth every 7 (seven) days. 4 capsule 0   No current facility-administered medications for this visit.    Allergies  Allergen Reactions  . Penicillins Anaphylaxis  . Food     Fish, Walnuts, Cashews, Almonds, Pecans, Blueberries, [place] nuts, Pistachios, Macadamia Nuts, Coconuts, Chestnuts  . Latex     "Rash, hives maybe... I don't really remember"    Social History   Socioeconomic History  . Marital status: Married    Spouse name: Marya Amsler  . Number of children: Not on file  . Years of education: Not on file  . Highest education level: Not on file  Occupational History  . Occupation: Energy manager   Tobacco Use  . Smoking status: Former Smoker    Years: 2.00    Types: Cigarettes    Quit date: 07/11/2003    Years since quitting: 17.0  . Smokeless tobacco: Never Used  Vaping Use  . Vaping Use: Never used  Substance and Sexual Activity  . Alcohol use: No    Alcohol/week: 0.0 standard drinks  . Drug use: No  . Sexual activity: Yes    Partners: Male  Other Topics Concern  . Not on file  Social History Narrative   Married with 2 children. Patient has a Financial risk analyst. Exercise: Cardio 2-3 times a week for 30 minutes.   Social Determinants of Health   Financial Resource Strain:   . Difficulty of Paying Living Expenses: Not on file  Food Insecurity:   . Worried About Charity fundraiser in the Last Year: Not on file  . Ran Out of Food in the Last Year: Not on file  Transportation Needs:   . Lack of Transportation (Medical): Not on file  . Lack of Transportation (Non-Medical): Not on file  Physical Activity:   . Days of Exercise per Week: Not on file  . Minutes of Exercise per Session: Not on file  Stress:   . Feeling of Stress : Not on file  Social Connections:   . Frequency of Communication with Friends and Family: Not on file  . Frequency of Social Gatherings with Friends and Family: Not on file  . Attends Religious Services: Not on file  . Active Member  of Clubs or Organizations: Not on file  . Attends Archivist Meetings: Not on file  . Marital Status: Not on file  Intimate Partner Violence:   . Fear of Current or Ex-Partner: Not on file  . Emotionally Abused: Not on file  . Physically Abused: Not on file  . Sexually Abused: Not on file    Review of Systems  Constitutional: Negative.   HENT: Negative.   Eyes: Negative.   Respiratory: Negative.   Cardiovascular: Negative.   Gastrointestinal: Negative.   Genitourinary: Negative.   Musculoskeletal: Negative.   Skin: Negative.   Neurological: Negative.   Endo/Heme/Allergies: Negative.   Psychiatric/Behavioral: Negative.     Objective   Vitals as reported by the patient: Today's Vitals   04/17/20 1332  Weight: 227 lb (103 kg)  Height: 5\' 3"  (1.6 m)    Sharise was seen today for medication refill.  Diagnoses and all orders for this visit:  Mild intermittent asthma without complication -     albuterol (VENTOLIN HFA) 108 (90 Base) MCG/ACT inhaler; Inhale 2 puffs into the lungs every 6 (six) hours as needed for wheezing or shortness of breath.  Essential hypertension -     lisinopril-hydrochlorothiazide (ZESTORETIC) 10-12.5 MG tablet; Take 1 tablet by mouth daily. Take 1 tablet by mouth daily.   PLAN  Refill x 6 mo  Suggest present to clinic for bp check and labs  Patient encouraged to call clinic with any questions, comments, or concerns.  I discussed the assessment and treatment plan with the patient. The patient was provided an opportunity to ask questions and all were answered. The patient agreed with the plan and demonstrated an understanding of the instructions.   The patient was advised to call back or seek an in-person evaluation if the symptoms worsen or if the condition fails to improve as anticipated.  I provided 13 minutes of non-face-to-face time during this encounter.  Maximiano Coss, NP  Primary Care at [facility]

## 2020-07-30 ENCOUNTER — Ambulatory Visit (INDEPENDENT_AMBULATORY_CARE_PROVIDER_SITE_OTHER): Payer: No Typology Code available for payment source | Admitting: Family Medicine

## 2020-07-30 ENCOUNTER — Other Ambulatory Visit: Payer: Self-pay

## 2020-07-30 ENCOUNTER — Encounter (INDEPENDENT_AMBULATORY_CARE_PROVIDER_SITE_OTHER): Payer: Self-pay | Admitting: Family Medicine

## 2020-07-30 VITALS — BP 113/78 | HR 88 | Temp 98.0°F | Ht 62.0 in | Wt 222.0 lb

## 2020-07-30 DIAGNOSIS — Z6841 Body Mass Index (BMI) 40.0 and over, adult: Secondary | ICD-10-CM

## 2020-07-30 DIAGNOSIS — Z9189 Other specified personal risk factors, not elsewhere classified: Secondary | ICD-10-CM

## 2020-07-30 DIAGNOSIS — D508 Other iron deficiency anemias: Secondary | ICD-10-CM

## 2020-07-30 DIAGNOSIS — R7303 Prediabetes: Secondary | ICD-10-CM | POA: Diagnosis not present

## 2020-07-30 DIAGNOSIS — E559 Vitamin D deficiency, unspecified: Secondary | ICD-10-CM

## 2020-07-30 DIAGNOSIS — F3289 Other specified depressive episodes: Secondary | ICD-10-CM | POA: Diagnosis not present

## 2020-07-30 MED ORDER — VITAMIN D (ERGOCALCIFEROL) 1.25 MG (50000 UNIT) PO CAPS: 50000.0000 [IU] | ORAL_CAPSULE | ORAL | 0 refills | Status: DC

## 2020-07-30 MED ORDER — METFORMIN HCL 500 MG PO TABS: 500.0000 mg | ORAL_TABLET | Freq: Three times a day (TID) | ORAL | 0 refills | Status: DC

## 2020-07-30 MED ORDER — BUPROPION HCL ER (SR) 200 MG PO TB12: 200.0000 mg | ORAL_TABLET | Freq: Two times a day (BID) | ORAL | 0 refills | Status: DC

## 2020-07-30 MED ORDER — TOPIRAMATE 50 MG PO TABS: 50.0000 mg | ORAL_TABLET | Freq: Every day | ORAL | 0 refills | Status: DC

## 2020-07-30 MED ORDER — FERROUS SULFATE 325 (65 FE) MG PO TABS: 325.0000 mg | ORAL_TABLET | Freq: Every day | ORAL | 0 refills | Status: DC

## 2020-07-31 LAB — CBC WITH DIFFERENTIAL/PLATELET
Basophils Absolute: 0.1 10*3/uL (ref 0.0–0.2)
Basos: 1 %
EOS (ABSOLUTE): 0.2 10*3/uL (ref 0.0–0.4)
Eos: 2 %
Hematocrit: 33.5 % — ABNORMAL LOW (ref 34.0–46.6)
Hemoglobin: 10.5 g/dL — ABNORMAL LOW (ref 11.1–15.9)
Immature Grans (Abs): 0.1 10*3/uL (ref 0.0–0.1)
Immature Granulocytes: 1 %
Lymphocytes Absolute: 1.6 10*3/uL (ref 0.7–3.1)
Lymphs: 15 %
MCH: 22.1 pg — ABNORMAL LOW (ref 26.6–33.0)
MCHC: 31.3 g/dL — ABNORMAL LOW (ref 31.5–35.7)
MCV: 70 fL — ABNORMAL LOW (ref 79–97)
Monocytes Absolute: 0.7 10*3/uL (ref 0.1–0.9)
Monocytes: 6 %
Neutrophils Absolute: 8.2 10*3/uL — ABNORMAL HIGH (ref 1.4–7.0)
Neutrophils: 75 %
Platelets: 463 10*3/uL — ABNORMAL HIGH (ref 150–450)
RBC: 4.76 x10E6/uL (ref 3.77–5.28)
RDW: 15.5 % — ABNORMAL HIGH (ref 11.7–15.4)
WBC: 10.7 10*3/uL (ref 3.4–10.8)

## 2020-07-31 LAB — COMPREHENSIVE METABOLIC PANEL
ALT: 10 IU/L (ref 0–32)
AST: 10 IU/L (ref 0–40)
Albumin/Globulin Ratio: 2 (ref 1.2–2.2)
Albumin: 4.4 g/dL (ref 3.8–4.8)
Alkaline Phosphatase: 77 IU/L (ref 44–121)
BUN/Creatinine Ratio: 9 (ref 9–23)
BUN: 8 mg/dL (ref 6–24)
Bilirubin Total: <0.2 mg/dL (ref 0.0–1.2)
CO2: 24 mmol/L (ref 20–29)
Calcium: 9.4 mg/dL (ref 8.7–10.2)
Chloride: 99 mmol/L (ref 96–106)
Creatinine, Ser: 0.87 mg/dL (ref 0.57–1.00)
GFR calc Af Amer: 94 mL/min/{1.73_m2} (ref >59)
GFR calc non Af Amer: 82 mL/min/{1.73_m2} (ref >59)
Globulin, Total: 2.2 g/dL (ref 1.5–4.5)
Glucose: 91 mg/dL (ref 65–99)
Potassium: 4 mmol/L (ref 3.5–5.2)
Sodium: 138 mmol/L (ref 134–144)
Total Protein: 6.6 g/dL (ref 6.0–8.5)

## 2020-07-31 LAB — HEMOGLOBIN A1C
Est. average glucose Bld gHb Est-mCnc: 103 mg/dL
Hgb A1c MFr Bld: 5.2 % (ref 4.8–5.6)

## 2020-07-31 LAB — VITAMIN D 25 HYDROXY (VIT D DEFICIENCY, FRACTURES): Vit D, 25-Hydroxy: 68.9 ng/mL (ref 30.0–100.0)

## 2020-07-31 LAB — INSULIN, RANDOM: INSULIN: 21 u[IU]/mL (ref 2.6–24.9)

## 2020-07-31 NOTE — Progress Notes (Signed)
Chief Complaint:   OBESITY Heidi Robinson is here to discuss her progress with her obesity treatment plan along with follow-up of her obesity related diagnoses. Heidi Robinson is on keeping a food journal and adhering to recommended goals of 1300 calories and 75+ grams of protein daily and states she is following her eating plan approximately 20% of the time. Heidi Robinson states she is walking for 30 minutes 2 times per week.  Today's visit was #: 82 Starting weight: 258 lbs Starting date: 12/21/2017 Today's weight: 222 lbs Today's date: 07/30/2020 Total lbs lost to date: 36 Total lbs lost since last in-office visit: 2  Interim History: Heidi Robinson continues to do well with weight loss on her Category 2 plan. Her hunger is controlled and she struggles to eat all her food sometimes.  Subjective:   1. Pre-diabetes Heidi Robinson is doing well on her diet and metformin, and she is due for labs.  2. Vitamin D deficiency Heidi Robinson is stable on Vit D, and her last level was at goal. She is at risk of over-replacement.  3. Other iron deficiency anemia Heidi Robinson is stable on Feso4, and she denies worsening fatigue. She is due for labs. She denies lightheadedness or tachycardia.  4. Other depression with emotional eating Heidi Robinson notes decreased emotional eating, and her blood pressure is well controlled.  5. At risk for impaired metabolic function Heidi Robinson is at increased risk for impaired metabolic function due to current nutrition and muscle mass.  Assessment/Plan:   1. Pre-diabetes Heidi Robinson will continue to work on weight loss, exercise, and decreasing simple carbohydrates to help decrease the risk of diabetes. We will check labs today, and we will refill metformin for 1 month.  - metFORMIN (GLUCOPHAGE) 500 MG tablet; Take 1 tablet (500 mg total) by mouth 3 (three) times daily.  Dispense: 90 tablet; Refill: 0 - Comprehensive metabolic panel - Insulin, random - Hemoglobin A1c  2. Vitamin D deficiency Low Vitamin D level contributes to  fatigue and are associated with obesity, breast, and colon cancer. We will check labs today, and we will refill prescription Vitamin D for 1 month. Heidi Robinson will follow-up for routine testing of Vitamin D, at least 2-3 times per year to avoid over-replacement.  - Vitamin D, Ergocalciferol, (DRISDOL) 1.25 MG (50000 UNIT) CAPS capsule; Take 1 capsule (50,000 Units total) by mouth every 7 (seven) days.  Dispense: 4 capsule; Refill: 0 - VITAMIN D 25 Hydroxy (Vit-D Deficiency, Fractures)  3. Other iron deficiency anemia We will check labs today, and we will refill Feso4 for 1 month. Heidi Robinson will continue to follow up as directed. Orders and follow up as documented in patient record.  Counseling . Iron is essential for our bodies to make red blood cells.  Reasons that someone may be deficient include: an iron-deficient diet (more likely in those following vegan or vegetarian diets), women with heavy menses, patients with GI disorders or poor absorption, patients that have had bariatric surgery, frequent blood donors, patients with cancer, and patients with heart disease.   [name] foods include dark leafy greens, red and white meats, eggs, seafood, and beans.   . Certain foods and drinks prevent your body from absorbing iron properly. Avoid eating these foods in the same meal as iron-rich foods or with iron supplements. These foods include: coffee, black tea, and red wine; milk, dairy products, and foods that are high in calcium; beans and soybeans; whole grains.  . Constipation can be a side effect of iron supplementation. Increased water and  fiber intake are helpful. Water goal: > 2 liters/day. Fiber goal: > 25 grams/day.  - ferrous sulfate 325 (65 FE) MG tablet; Take 1 tablet (325 mg total) by mouth daily with breakfast.  Dispense: 30 tablet; Refill: 0 - CBC with Differential/Platelet  4. Other depression with emotional eating Behavior modification techniques were discussed today to help Heidi Robinson deal with  her emotional/non-hunger eating behaviors.we will refill Wellbutrin SR and Topamax for 1 month. Orders and follow up as documented in patient record.   - buPROPion (WELLBUTRIN SR) 200 MG 12 hr tablet; Take 1 tablet (200 mg total) by mouth 2 (two) times daily.  Dispense: 60 tablet; Refill: 0 - topiramate (TOPAMAX) 50 MG tablet; Take 1 tablet (50 mg total) by mouth at bedtime.  Dispense: 30 tablet; Refill: 0  5. At risk for impaired metabolic function Heidi Robinson was given approximately 15 minutes of impaired  metabolic function prevention counseling today. We discussed intensive lifestyle modifications today with an emphasis on specific nutrition and exercise instructions and strategies.   Repetitive spaced learning was employed today to elicit superior memory formation and behavioral change.  6. Class 3 severe obesity with serious comorbidity and body mass index (BMI) of 40.0 to 44.9 in adult, unspecified obesity type (HCC) Heidi Robinson is currently in the action stage of change. As such, her goal is to continue with weight loss efforts. She has agreed to the Category 2 Plan or keeping a food journal and adhering to recommended goals of 1200-1300 calories and 75+ grams of protein daily.   Exercise goals: As is.  Behavioral modification strategies: no skipping meals.  Heidi Robinson has agreed to follow-up with our clinic in 3 to 4 weeks. She was informed of the importance of frequent follow-up visits to maximize her success with intensive lifestyle modifications for her multiple health conditions.   Heidi Robinson was informed we would discuss her lab results at her next visit unless there is a critical issue that needs to be addressed sooner. Heidi Robinson agreed to keep her next visit at the agreed upon time to discuss these results.  Objective:   Blood pressure 113/78, pulse 88, temperature 98 F (36.7 C), height 5\' 2"  (1.575 m), weight 222 lb (100.7 kg), SpO2 99 %. Body mass index is 40.6 kg/m.  General: Cooperative, alert,  well developed, in no acute distress. HEENT: Conjunctivae and lids unremarkable. Cardiovascular: Regular rhythm.  Lungs: Normal work of breathing. Neurologic: No focal deficits.   Lab Results  Component Value Date   CREATININE 0.87 07/30/2020   BUN 8 07/30/2020   NA 138 07/30/2020   K 4.0 07/30/2020   CL 99 07/30/2020   CO2 24 07/30/2020   Lab Results  Component Value Date   ALT 10 07/30/2020   AST 10 07/30/2020   ALKPHOS 77 07/30/2020   BILITOT <0.2 07/30/2020   Lab Results  Component Value Date   HGBA1C 5.2 07/30/2020   HGBA1C 5.0 03/11/2020   HGBA1C 4.9 09/01/2019   HGBA1C 5.2 05/11/2018   HGBA1C 5.7 (H) 12/21/2017   Lab Results  Component Value Date   INSULIN 21.0 07/30/2020   INSULIN 17.3 03/11/2020   INSULIN 13.1 09/01/2019   INSULIN 16.7 05/11/2018   INSULIN 23.4 12/21/2017   Lab Results  Component Value Date   TSH 1.750 03/11/2020   Lab Results  Component Value Date   CHOL 150 03/11/2020   HDL 44 03/11/2020   LDLCALC 89 03/11/2020   TRIG 87 03/11/2020   CHOLHDL 3.8 08/04/2017   Lab Results  Component Value Date   WBC 10.7 07/30/2020   HGB 10.5 (L) 07/30/2020   HCT 33.5 (L) 07/30/2020   MCV 70 (L) 07/30/2020   PLT 463 (H) 07/30/2020   Lab Results  Component Value Date   IRON 25 (L) 09/01/2019   TIBC 367 09/01/2019   FERRITIN 13 (L) 09/01/2019   Attestation Statements:   Reviewed by clinician on day of visit: allergies, medications, problem list, medical history, surgical history, family history, social history, and previous encounter notes.   I, Trixie Dredge, am acting as transcriptionist for Dennard Nip, MD.  I have reviewed the above documentation for accuracy and completeness, and I agree with the above. -  Dennard Nip, MD

## 2020-08-29 ENCOUNTER — Other Ambulatory Visit: Payer: Self-pay

## 2020-08-29 ENCOUNTER — Ambulatory Visit (INDEPENDENT_AMBULATORY_CARE_PROVIDER_SITE_OTHER): Payer: No Typology Code available for payment source | Admitting: Family Medicine

## 2020-08-29 ENCOUNTER — Encounter (INDEPENDENT_AMBULATORY_CARE_PROVIDER_SITE_OTHER): Payer: Self-pay | Admitting: Family Medicine

## 2020-08-29 VITALS — BP 125/83 | HR 106 | Temp 98.0°F | Ht 62.0 in | Wt 218.0 lb

## 2020-08-29 DIAGNOSIS — E66812 Obesity, class 2: Secondary | ICD-10-CM

## 2020-08-29 DIAGNOSIS — F3289 Other specified depressive episodes: Secondary | ICD-10-CM

## 2020-08-29 DIAGNOSIS — E559 Vitamin D deficiency, unspecified: Secondary | ICD-10-CM

## 2020-08-29 DIAGNOSIS — Z9189 Other specified personal risk factors, not elsewhere classified: Secondary | ICD-10-CM | POA: Diagnosis not present

## 2020-08-29 DIAGNOSIS — Z6839 Body mass index (BMI) 39.0-39.9, adult: Secondary | ICD-10-CM

## 2020-08-29 DIAGNOSIS — R7303 Prediabetes: Secondary | ICD-10-CM

## 2020-08-29 DIAGNOSIS — E88819 Insulin resistance, unspecified: Secondary | ICD-10-CM

## 2020-08-29 DIAGNOSIS — D6489 Other specified anemias: Secondary | ICD-10-CM

## 2020-08-29 DIAGNOSIS — E8881 Metabolic syndrome: Secondary | ICD-10-CM

## 2020-08-29 DIAGNOSIS — D508 Other iron deficiency anemias: Secondary | ICD-10-CM

## 2020-08-29 MED ORDER — TOPIRAMATE 50 MG PO TABS: 50.0000 mg | ORAL_TABLET | Freq: Every day | ORAL | 0 refills | Status: DC

## 2020-08-29 MED ORDER — BUPROPION HCL ER (SR) 200 MG PO TB12: 200.0000 mg | ORAL_TABLET | Freq: Two times a day (BID) | ORAL | 0 refills | Status: DC

## 2020-08-29 MED ORDER — FERROUS SULFATE 325 (65 FE) MG PO TABS: 325.0000 mg | ORAL_TABLET | Freq: Every day | ORAL | 0 refills | Status: DC

## 2020-08-29 MED ORDER — METFORMIN HCL 500 MG PO TABS: 500.0000 mg | ORAL_TABLET | Freq: Three times a day (TID) | ORAL | 0 refills | Status: DC

## 2020-08-29 MED ORDER — VITAMIN D (ERGOCALCIFEROL) 1.25 MG (50000 UNIT) PO CAPS: 50000.0000 [IU] | ORAL_CAPSULE | ORAL | 0 refills | Status: DC

## 2020-09-04 NOTE — Progress Notes (Signed)
Chief Complaint:   OBESITY Heidi Robinson is here to discuss her progress with her obesity treatment plan along with follow-up of her obesity related diagnoses. Heidi Robinson is on the Category 2 Plan or keeping a food journal and adhering to recommended goals of 1200-1300 calories and 75+ grams of protein daily and states she is following her eating plan approximately 0% of the time. Heidi Robinson states she is doing 0 minutes 0 times per week.  Today's visit was #: 29 Starting weight: 258 lbs Starting date: 12/21/2017 Today's weight: 218 lbs Today's date: 08/29/2020 Total lbs lost to date: 40 Total lbs lost since last in-office visit: 4  Interim History: Heidi Robinson continues to do well with weight loss. She is doing better with increasing protein and not skipping meals, and she is mindful of her food choices. She is under a lot of stress from multiple sources and her sleep is suffering.  Subjective:   1. Insulin resistance Heidi Robinson's A1c and glucose are stable but her fasting insulin is starting to creep back up. She has already gotten back on track with her eating and is doing well with metformin. She denies hypoglycemia. I discussed labs with the patient today.  2. Vitamin D deficiency Heidi Robinson's Vit D level is at goal, and she denies nausea or vomiting. She requests a refill today. I discussed labs with the patient today.  3. Other depression with emotional eating Heidi Robinson is stable on her medications, and her blood pressure is controlled. She is doing well with decreasing emotional eating.  4. Anemia  Heidi Robinson's hemoglobin has slightly dropped, but is still within her "normal" range. She is tolerating iron well. I discussed labs with the patient today.  5. At risk for impaired metabolic function Heidi Robinson is at increased risk for impaired metabolic function due to decreased sleep quality and quantity.  Assessment/Plan:   1. Insulin resistance Heidi Robinson will continue to work on weight loss, exercise, and decreasing simple  carbohydrates to help decrease the risk of diabetes. We will refill metformin for 1 month. Heidi Robinson agreed to follow-up with [place] as directed to closely monitor her progress.  - metFORMIN (GLUCOPHAGE) 500 MG tablet; Take 1 tablet (500 mg total) by mouth 3 (three) times daily.  Dispense: 90 tablet; Refill: 0  2. Vitamin D deficiency Low Vitamin D level contributes to fatigue and are associated with obesity, breast, and colon cancer. We will refill prescription Vitamin D for 1 month. Heidi Robinson will follow-up for routine testing of Vitamin D, at least 2-3 times per year to avoid over-replacement.  - Vitamin D, Ergocalciferol, (DRISDOL) 1.25 MG (50000 UNIT) CAPS capsule; Take 1 capsule (50,000 Units total) by mouth every 7 (seven) days.  Dispense: 4 capsule; Refill: 0  3. Other depression with emotional eating Behavior modification techniques were discussed today to help Heidi Robinson deal with her emotional/non-hunger eating behaviors. We will refill both Wellbutrin SR and Topamax for 1 month. Orders and follow up as documented in patient record.   - topiramate (TOPAMAX) 50 MG tablet; Take 1 tablet (50 mg total) by mouth at bedtime.  Dispense: 30 tablet; Refill: 0 - buPROPion (WELLBUTRIN SR) 200 MG 12 hr tablet; Take 1 tablet (200 mg total) by mouth 2 (two) times daily.  Dispense: 60 tablet; Refill: 0  4. Anemia  We will refill FeSo4 for 1 month. Heidi Robinson will continue to follow up as directed. Orders and follow up as documented in patient record.  - ferrous sulfate 325 (65 FE) MG tablet; Take 1 tablet (325  mg total) by mouth daily with breakfast.  Dispense: 30 tablet; Refill: 0  5. At risk for impaired metabolic function Heidi Robinson was given approximately 15 minutes of impaired  metabolic function prevention counseling today. We discussed intensive lifestyle modifications today with an emphasis on specific nutrition and exercise instructions and strategies.   Repetitive spaced learning was employed today to elicit superior  memory formation and behavioral change.  6. Class 2 severe obesity with serious comorbidity and body mass index (BMI) of 39.0 to 39.9 in adult, unspecified obesity type (HCC) Heidi Robinson is currently in the action stage of change. As such, her goal is to continue with weight loss efforts. She has agreed to keeping a food journal and adhering to recommended goals of 1200-1300 calories and 75 grams of protein daily.   Exercise goals: All adults should avoid inactivity. Some physical activity is better than none, and adults who participate in any amount of physical activity gain some health benefits.  Behavioral modification strategies: meal planning and cooking strategies.  Heidi Robinson has agreed to follow-up with our clinic in 4 weeks. She was informed of the importance of frequent follow-up visits to maximize her success with intensive lifestyle modifications for her multiple health conditions.   Objective:   Blood pressure 125/83, pulse (!) 106, temperature 98 F (36.7 C), height 5\' 2"  (1.575 m), weight 218 lb (98.9 kg), SpO2 100 %. Body mass index is 39.87 kg/m.  General: Cooperative, alert, well developed, in no acute distress. HEENT: Conjunctivae and lids unremarkable. Cardiovascular: Regular rhythm.  Lungs: Normal work of breathing. Neurologic: No focal deficits.   Lab Results  Component Value Date   CREATININE 0.87 07/30/2020   BUN 8 07/30/2020   NA 138 07/30/2020   K 4.0 07/30/2020   CL 99 07/30/2020   CO2 24 07/30/2020   Lab Results  Component Value Date   ALT 10 07/30/2020   AST 10 07/30/2020   ALKPHOS 77 07/30/2020   BILITOT <0.2 07/30/2020   Lab Results  Component Value Date   HGBA1C 5.2 07/30/2020   HGBA1C 5.0 03/11/2020   HGBA1C 4.9 09/01/2019   HGBA1C 5.2 05/11/2018   HGBA1C 5.7 (H) 12/21/2017   Lab Results  Component Value Date   INSULIN 21.0 07/30/2020   INSULIN 17.3 03/11/2020   INSULIN 13.1 09/01/2019   INSULIN 16.7 05/11/2018   INSULIN 23.4 12/21/2017    Lab Results  Component Value Date   TSH 1.750 03/11/2020   Lab Results  Component Value Date   CHOL 150 03/11/2020   HDL 44 03/11/2020   LDLCALC 89 03/11/2020   TRIG 87 03/11/2020   CHOLHDL 3.8 08/04/2017   Lab Results  Component Value Date   WBC 10.7 07/30/2020   HGB 10.5 (L) 07/30/2020   HCT 33.5 (L) 07/30/2020   MCV 70 (L) 07/30/2020   PLT 463 (H) 07/30/2020   Lab Results  Component Value Date   IRON 25 (L) 09/01/2019   TIBC 367 09/01/2019   FERRITIN 13 (L) 09/01/2019   Attestation Statements:   Reviewed by clinician on day of visit: allergies, medications, problem list, medical history, surgical history, family history, social history, and previous encounter notes.   I, Trixie Dredge, am acting as transcriptionist for Dennard Nip, MD.  I have reviewed the above documentation for accuracy and completeness, and I agree with the above. -  Dennard Nip, MD

## 2020-09-30 ENCOUNTER — Ambulatory Visit (INDEPENDENT_AMBULATORY_CARE_PROVIDER_SITE_OTHER): Payer: No Typology Code available for payment source | Admitting: Family Medicine

## 2020-10-10 ENCOUNTER — Ambulatory Visit (INDEPENDENT_AMBULATORY_CARE_PROVIDER_SITE_OTHER): Payer: No Typology Code available for payment source | Admitting: Family Medicine

## 2020-10-10 ENCOUNTER — Other Ambulatory Visit: Payer: Self-pay

## 2020-10-10 ENCOUNTER — Encounter (INDEPENDENT_AMBULATORY_CARE_PROVIDER_SITE_OTHER): Payer: Self-pay | Admitting: Family Medicine

## 2020-10-10 VITALS — BP 112/76 | HR 64 | Temp 98.3°F | Ht 62.0 in | Wt 219.0 lb

## 2020-10-10 DIAGNOSIS — R7303 Prediabetes: Secondary | ICD-10-CM

## 2020-10-10 DIAGNOSIS — Z9189 Other specified personal risk factors, not elsewhere classified: Secondary | ICD-10-CM | POA: Diagnosis not present

## 2020-10-10 DIAGNOSIS — Z6841 Body Mass Index (BMI) 40.0 and over, adult: Secondary | ICD-10-CM

## 2020-10-10 DIAGNOSIS — I10 Essential (primary) hypertension: Secondary | ICD-10-CM | POA: Diagnosis not present

## 2020-10-10 DIAGNOSIS — F3289 Other specified depressive episodes: Secondary | ICD-10-CM

## 2020-10-10 DIAGNOSIS — E559 Vitamin D deficiency, unspecified: Secondary | ICD-10-CM | POA: Diagnosis not present

## 2020-10-10 DIAGNOSIS — D508 Other iron deficiency anemias: Secondary | ICD-10-CM

## 2020-10-10 MED ORDER — FERROUS SULFATE 325 (65 FE) MG PO TABS: 325.0000 mg | ORAL_TABLET | Freq: Every day | ORAL | 0 refills | Status: DC

## 2020-10-10 MED ORDER — BUPROPION HCL ER (SR) 200 MG PO TB12: 200.0000 mg | ORAL_TABLET | Freq: Two times a day (BID) | ORAL | 0 refills | Status: DC

## 2020-10-10 MED ORDER — LISINOPRIL-HYDROCHLOROTHIAZIDE 10-12.5 MG PO TABS: 1.0000 | ORAL_TABLET | Freq: Every day | ORAL | 0 refills | Status: DC

## 2020-10-10 MED ORDER — TOPIRAMATE 50 MG PO TABS: 50.0000 mg | ORAL_TABLET | Freq: Every day | ORAL | 0 refills | Status: DC

## 2020-10-10 MED ORDER — VITAMIN D (ERGOCALCIFEROL) 1.25 MG (50000 UNIT) PO CAPS: 50000.0000 [IU] | ORAL_CAPSULE | ORAL | 0 refills | Status: DC

## 2020-10-10 MED ORDER — METFORMIN HCL 500 MG PO TABS: 500.0000 mg | ORAL_TABLET | Freq: Three times a day (TID) | ORAL | 0 refills | Status: DC

## 2020-10-14 ENCOUNTER — Other Ambulatory Visit: Payer: Self-pay | Admitting: Obstetrics and Gynecology

## 2020-10-14 DIAGNOSIS — Z1231 Encounter for screening mammogram for malignant neoplasm of breast: Secondary | ICD-10-CM

## 2020-10-15 NOTE — Progress Notes (Signed)
Chief Complaint:   OBESITY Heidi Robinson is here to discuss her progress with her obesity treatment plan along with follow-up of her obesity related diagnoses. Heidi Robinson is on keeping a food journal and adhering to recommended goals of 1200-1300 calories and 75 grams of protein daily and states she is following her eating plan approximately 0% of the time. Heidi Robinson states she is doing 0 minutes 0 times per week.  Today's visit was #: 75 Starting weight: 258 lbs Starting date: 12/21/2017 Today's weight: 219 lbs Today's date: 10/10/2020 Total lbs lost to date: 39 Total lbs lost since last in-office visit: 0  Interim History: Heidi Robinson has done well minimizing her weight gain over Thanksgiving and her birthday. She feels she is ready to get back on track with her eating plan, and she is ready to go back to journaling.  Subjective:   1. Other iron deficiency anemia Heidi Robinson is stable on iron, and she is tolerating it well. She requests a refill today.  2. Vitamin D deficiency Heidi Robinson is stable on Vit D, and she denies nausea or vomiting. She requests a refill today.  3. Pre-diabetes Heidi Robinson is working on diet and weight loss. She is trying to minimize simple carbohydrates.  4. Essential hypertension Heidi Robinson's blood pressure is controlled today on her medications. She is requests a refill of lisinopril-hydrochlorothiazide. She is looking for a new primary care physician, but doesn't have one yet.  5. Other depression with emotional eating Heidi Robinson is stable on her medications, and she is doing well minimizing emotional eating. She requests a refill today.  6. At risk for heart disease Heidi Robinson is at a higher than average risk for cardiovascular disease due to obesity.   Assessment/Plan:   1. Other iron deficiency anemia We will refill FeSo4 for 1 month. We will recheck labs at [place] next visit. Orders and follow up as documented in patient record.  - ferrous sulfate 325 (65 FE) MG tablet; Take 1 tablet (325 mg total)  by mouth daily with breakfast.  Dispense: 30 tablet; Refill: 0  2. Vitamin D deficiency Low Vitamin D level contributes to fatigue and are associated with obesity, breast, and colon cancer. We will refill prescription Vitamin D for 1 month, and we will recheck labs at her next visit. Heidi Robinson will follow-up for routine testing of Vitamin D, at least 2-3 times per year to avoid over-replacement.  - Vitamin D, Ergocalciferol, (DRISDOL) 1.25 MG (50000 UNIT) CAPS capsule; Take 1 capsule (50,000 Units total) by mouth every 7 (seven) days.  Dispense: 4 capsule; Refill: 0  3. Pre-diabetes Heidi Robinson will continue to work on weight loss, exercise, and decreasing simple carbohydrates to help decrease the risk of diabetes. We will refill metformin for 1 month, and we will recheck labs at her next visit.  - metFORMIN (GLUCOPHAGE) 500 MG tablet; Take 1 tablet (500 mg total) by mouth 3 (three) times daily.  Dispense: 90 tablet; Refill: 0  4. Essential hypertension Heidi Robinson is working on healthy weight loss and exercise to improve blood pressure control. We will watch for signs of hypotension as she continues her lifestyle modifications. We will refill lisinopril-hydrochlorothiazide for 1 month.  - lisinopril-hydrochlorothiazide (ZESTORETIC) 10-12.5 MG tablet; Take 1 tablet by mouth daily. Take 1 tablet by mouth daily.  Dispense: 30 tablet; Refill: 0  5. Other depression with emotional eating Behavior modification techniques were discussed today to help Heidi Robinson deal with her emotional/non-hunger eating behaviors. We will refill Wellbutrin SR and Topamax for 1 month. Orders  and follow up as documented in patient record.   - topiramate (TOPAMAX) 50 MG tablet; Take 1 tablet (50 mg total) by mouth at bedtime.  Dispense: 30 tablet; Refill: 0 - buPROPion (WELLBUTRIN SR) 200 MG 12 hr tablet; Take 1 tablet (200 mg total) by mouth 2 (two) times daily.  Dispense: 60 tablet; Refill: 0  6. At risk for heart disease Heidi Robinson was given  approximately 15 minutes of coronary artery disease prevention counseling today. She is 44 y.o. female and has risk factors for heart disease including obesity. We discussed intensive lifestyle modifications today with an emphasis on specific weight loss instructions and strategies.   Repetitive spaced learning was employed today to elicit superior memory formation and behavioral change.  7. Class 3 severe obesity with serious comorbidity and body mass index (BMI) of 40.0 to 44.9 in adult, unspecified obesity type (HCC) Heidi Robinson is currently in the action stage of change. As such, her goal is to continue with weight loss efforts. She has agreed to keeping a food journal and adhering to recommended goals of 1200-1300 calories and 75+ grams of protein daily.   Behavioral modification strategies: meal planning and cooking strategies and holiday eating strategies .  Heidi Robinson has agreed to follow-up with our clinic in 4 to 5 weeks. She was informed of the importance of frequent follow-up visits to maximize her success with intensive lifestyle modifications for her multiple health conditions.   Objective:   Blood pressure 112/76, pulse 64, temperature 98.3 F (36.8 C), height 5\' 2"  (1.575 m), weight 219 lb (99.3 kg), SpO2 99 %. Body mass index is 40.06 kg/m.  General: Cooperative, alert, well developed, in no acute distress. HEENT: Conjunctivae and lids unremarkable. Cardiovascular: Regular rhythm.  Lungs: Normal work of breathing. Neurologic: No focal deficits.   Lab Results  Component Value Date   CREATININE 0.87 07/30/2020   BUN 8 07/30/2020   NA 138 07/30/2020   K 4.0 07/30/2020   CL 99 07/30/2020   CO2 24 07/30/2020   Lab Results  Component Value Date   ALT 10 07/30/2020   AST 10 07/30/2020   ALKPHOS 77 07/30/2020   BILITOT <0.2 07/30/2020   Lab Results  Component Value Date   HGBA1C 5.2 07/30/2020   HGBA1C 5.0 03/11/2020   HGBA1C 4.9 09/01/2019   HGBA1C 5.2 05/11/2018   HGBA1C  5.7 (H) 12/21/2017   Lab Results  Component Value Date   INSULIN 21.0 07/30/2020   INSULIN 17.3 03/11/2020   INSULIN 13.1 09/01/2019   INSULIN 16.7 05/11/2018   INSULIN 23.4 12/21/2017   Lab Results  Component Value Date   TSH 1.750 03/11/2020   Lab Results  Component Value Date   CHOL 150 03/11/2020   HDL 44 03/11/2020   LDLCALC 89 03/11/2020   TRIG 87 03/11/2020   CHOLHDL 3.8 08/04/2017   Lab Results  Component Value Date   WBC 10.7 07/30/2020   HGB 10.5 (L) 07/30/2020   HCT 33.5 (L) 07/30/2020   MCV 70 (L) 07/30/2020   PLT 463 (H) 07/30/2020   Lab Results  Component Value Date   IRON 25 (L) 09/01/2019   TIBC 367 09/01/2019   FERRITIN 13 (L) 09/01/2019   Attestation Statements:   Reviewed by clinician on day of visit: allergies, medications, problem list, medical history, surgical history, family history, social history, and previous encounter notes.   I, Trixie Dredge, am acting as transcriptionist for Dennard Nip, MD.  I have reviewed the above documentation for accuracy and  completeness, and I agree with the above. -  Dennard Nip, MD

## 2020-11-13 ENCOUNTER — Ambulatory Visit (INDEPENDENT_AMBULATORY_CARE_PROVIDER_SITE_OTHER): Payer: No Typology Code available for payment source | Admitting: Family Medicine

## 2020-11-14 ENCOUNTER — Telehealth (INDEPENDENT_AMBULATORY_CARE_PROVIDER_SITE_OTHER): Payer: No Typology Code available for payment source | Admitting: Family Medicine

## 2020-11-14 ENCOUNTER — Encounter (INDEPENDENT_AMBULATORY_CARE_PROVIDER_SITE_OTHER): Payer: Self-pay | Admitting: Family Medicine

## 2020-11-14 DIAGNOSIS — Z9189 Other specified personal risk factors, not elsewhere classified: Secondary | ICD-10-CM | POA: Diagnosis not present

## 2020-11-14 DIAGNOSIS — I10 Essential (primary) hypertension: Secondary | ICD-10-CM

## 2020-11-14 DIAGNOSIS — R7303 Prediabetes: Secondary | ICD-10-CM

## 2020-11-14 DIAGNOSIS — F3289 Other specified depressive episodes: Secondary | ICD-10-CM

## 2020-11-14 DIAGNOSIS — E559 Vitamin D deficiency, unspecified: Secondary | ICD-10-CM

## 2020-11-14 DIAGNOSIS — Z6841 Body Mass Index (BMI) 40.0 and over, adult: Secondary | ICD-10-CM

## 2020-11-14 DIAGNOSIS — D508 Other iron deficiency anemias: Secondary | ICD-10-CM

## 2020-11-14 MED ORDER — LISINOPRIL-HYDROCHLOROTHIAZIDE 10-12.5 MG PO TABS: 1.0000 | ORAL_TABLET | Freq: Every day | ORAL | 0 refills | Status: DC

## 2020-11-14 MED ORDER — VITAMIN D (ERGOCALCIFEROL) 1.25 MG (50000 UNIT) PO CAPS
50000.0000 [IU] | ORAL_CAPSULE | ORAL | 0 refills | Status: DC
Start: 2020-11-14 — End: 2020-12-19

## 2020-11-14 MED ORDER — METFORMIN HCL 500 MG PO TABS: 500.0000 mg | ORAL_TABLET | Freq: Three times a day (TID) | ORAL | 0 refills | Status: DC

## 2020-11-14 MED ORDER — FERROUS SULFATE 325 (65 FE) MG PO TABS: 325.0000 mg | ORAL_TABLET | Freq: Every day | ORAL | 0 refills | Status: DC

## 2020-11-14 MED ORDER — TOPIRAMATE 50 MG PO TABS: 50.0000 mg | ORAL_TABLET | Freq: Every day | ORAL | 0 refills | Status: DC

## 2020-11-15 ENCOUNTER — Telehealth (INDEPENDENT_AMBULATORY_CARE_PROVIDER_SITE_OTHER): Payer: No Typology Code available for payment source | Admitting: Family Medicine

## 2020-11-15 ENCOUNTER — Encounter: Payer: Self-pay | Admitting: Family Medicine

## 2020-11-15 DIAGNOSIS — J329 Chronic sinusitis, unspecified: Secondary | ICD-10-CM

## 2020-11-15 DIAGNOSIS — R059 Cough, unspecified: Secondary | ICD-10-CM | POA: Diagnosis not present

## 2020-11-15 DIAGNOSIS — J4520 Mild intermittent asthma, uncomplicated: Secondary | ICD-10-CM

## 2020-11-15 DIAGNOSIS — B349 Viral infection, unspecified: Secondary | ICD-10-CM | POA: Diagnosis not present

## 2020-11-15 MED ORDER — AZITHROMYCIN 250 MG PO TABS: ORAL_TABLET | ORAL | 0 refills | Status: DC

## 2020-11-15 MED ORDER — ALBUTEROL SULFATE HFA 108 (90 BASE) MCG/ACT IN AERS: 2.0000 | INHALATION_SPRAY | Freq: Four times a day (QID) | RESPIRATORY_TRACT | 3 refills | Status: AC | PRN

## 2020-11-15 NOTE — Progress Notes (Signed)
Patient ID: Heidi Robinson, female    DOB: January 09, 1976  Age: 45 y.o. MRN: [id number]  Chief Complaint  Patient presents with  . Cough    Pt started with cough congestion on Monday evening son was sick all christmas break checked for COVID multiple times and negative pt son got abx and was better but now pt is having the same symptoms, wheezing, yellow mucous, denies chills, pt has been vaccinated    Subjective:   I spoke to patient from the office and she was in a secure location at home. She identified herself appropriately. She understands that she is held responsible for the bills. She also understands that diagnosis and treatment by phone is not the same as seeing somebody in person. This visit took about 27 minutes.  Patient has been ill since Monday. Apparently her child tested negative a couple of weeks ago for COVID, persisted with symptoms, and was finally given a antibiotic a few days ago by her pediatrician. The antibiotic cleared things up quickly. The patient thinks she has the same thing. She has had head congestion and now cough. Felt a little bad but no major fever. No loss of telemetry taste or smell. She has been wheezing some. She has a history of asthma and is out of her albuterol apparently. She works from home and has been resting at home. She did have her COVID vaccinations, the initial 2, but has not gotten her booster. She did not get the booster because she was waiting to get her mammogram. I explained to her that the booster was much more important at this phase of her life than the mammogram was.  Current allergies, medications, problem list, past/family and social histories reviewed.  Objective:  There were no vitals taken for this visit.  Unable to examine her but I could hear her coughing on the phone.  Assessment & Plan:   Assessment: 1. Acute viral syndrome   2. Mild intermittent asthma without complication   3. Cough   4. Chronic sinusitis, unspecified location        Plan: See instructions. Prescription sent in. She is to come by for the COVID test.  Orders Placed This Encounter  Procedures  . COVID-19, Flu A+B and RSV    Order Specific Question:   Is this test for diagnosis or screening    Answer:   Diagnosis of ill patient    Order Specific Question:   Symptomatic for COVID-19 as defined by CDC    Answer:   Yes    Order Specific Question:   Date of Symptom Onset    Answer:   11/12/2019    Order Specific Question:   Hospitalized for COVID-19    Answer:   No    Order Specific Question:   Admitted to ICU for COVID-19    Answer:   No    Order Specific Question:   Previously tested for COVID-19    Answer:   Yes    Order Specific Question:   Resident in a congregate (group) care setting    Answer:   No    Order Specific Question:   Is the patient student?    Answer:   No    Order Specific Question:   Employed in healthcare setting    Answer:   No    Order Specific Question:   Pregnant    Answer:   No    Order Specific Question:   Has patient completed COVID vaccination(s) (2  doses of Pfizer/Moderna 1 dose of [employer])    Answer:   Yes    Order Specific Question:   Release to patient    Answer:   Immediate    Meds ordered this encounter  Medications  . albuterol (VENTOLIN HFA) 108 (90 Base) MCG/ACT inhaler    Sig: Inhale 2 puffs into the lungs every 6 (six) hours as needed for wheezing or shortness of breath.    Dispense:  18 g    Refill:  3  . azithromycin (ZITHROMAX) 250 MG tablet    Sig: Take 2 pills initially, then 1 pill daily for 4 days. Infection.    Dispense:  6 tablet    Refill:  0         Patient Instructions    Your symptoms are most consistent with the omicron variant of COVID. I know your history seems like this might be a sinus infection, which it could be, but I think COVID is the most likely thing.  Take Robitussin DM or one of the other DM containing cough syrups  Drink plenty of fluids  Get  plenty of rest.  If you can monitor your oxygen level with a pulse oximeter do so. If it gets less than 90 and you are short of breath he should consider going to the emergency room.  I have refilled your albuterol inhaler and you can use that for the wheezing.  I prescribed azithromycin 2 pills initially then 1 daily for 4 days.  Until you know the results of your COVID test you should be well quarantine.  You can drive up to the office and I have put in the order for you to get a swab for COVID and flu.   If you have lab work done today you will be contacted with your lab results within the next 2 weeks.  If you have not heard from [place] then please contact us. The fastest way to get your results is to register for My Chart.   IF you received an x-ray today, you will receive an invoice from [facility]. Please contact [facility] at [phone] with questions or concerns regarding your invoice.   IF you received labwork today, you will receive an invoice from [place] [facility]. Please contact [facility] at [phone] with questions or concerns regarding your invoice.   Our billing staff will not be able to assist you with questions regarding bills from these companies.  You will be contacted with the lab results as soon as they are available. The fastest way to get your results is to activate your My Chart account. Instructions are located on the last page of this paperwork. If you have not heard from [place] regarding the results in 2 weeks, please contact this office.        Return if symptoms worsen or fail to improve.   Ruben Reason, MD 11/15/2020

## 2020-11-15 NOTE — Patient Instructions (Addendum)
  Your symptoms are most consistent with the omicron variant of COVID. I know your history seems like this might be a sinus infection, which it could be, but I think COVID is the most likely thing.  Take Robitussin DM or one of the other DM containing cough syrups  Drink plenty of fluids  Get plenty of rest.  If you can monitor your oxygen level with a pulse oximeter do so. If it gets less than 90 and you are short of breath he should consider going to the emergency room.  I have refilled your albuterol inhaler and you can use that for the wheezing.  I prescribed azithromycin 2 pills initially then 1 daily for 4 days.  Until you know the results of your COVID test you should be well quarantine.  You can drive up to the office and I have put in the order for you to get a swab for COVID and flu.   If you have lab work done today you will be contacted with your lab results within the next 2 weeks.  If you have not heard from [place] then please contact us. The fastest way to get your results is to register for My Chart.   IF you received an x-ray today, you will receive an invoice from [facility]. Please contact [facility] at [phone] with questions or concerns regarding your invoice.   IF you received labwork today, you will receive an invoice from [place]. Please contact [facility] at [phone] with questions or concerns regarding your invoice.   Our billing staff will not be able to assist you with questions regarding bills from these companies.  You will be contacted with the lab results as soon as they are available. The fastest way to get your results is to activate your My Chart account. Instructions are located on the last page of this paperwork. If you have not heard from [place] regarding the results in 2 weeks, please contact this office.

## 2020-11-17 LAB — COVID-19, FLU A+B AND RSV
Influenza A, NAA: NOT DETECTED
Influenza B, NAA: NOT DETECTED
RSV, NAA: NOT DETECTED
SARS-CoV-2, NAA: DETECTED — AB

## 2020-11-18 NOTE — Progress Notes (Signed)
TeleHealth Visit:  Due to the COVID-19 pandemic, this visit was completed with telemedicine (audio/video) technology to reduce patient and provider exposure as well as to preserve personal protective equipment.   Heidi Robinson has verbally consented to this TeleHealth visit. The patient is located at home, the provider is located at the [facility] office. The participants in this visit include the listed provider and patient. The visit was conducted today via MyChart video.   Chief Complaint: OBESITY Heidi Robinson is here to discuss her progress with her obesity treatment plan along with follow-up of her obesity related diagnoses. Heidi Robinson is on keeping a food journal and adhering to recommended goals of 1300 calories and 75+ grams of protein daily and states she is following her eating plan approximately 0% of the time. Heidi Robinson states she is doing 0 minutes 0 times per week.  Today's visit was #: 16 Starting weight: 258 lbs Starting date: 12/21/2017  Interim History: Heidi Robinson feels she did well maintaining her weight over the holidays. She did some celebration eating, but was especially good at avoiding the little tempting snacks like cookie etc. She has now been sick for the last few days and is not concentrating on diet.  Subjective:   1. Essential hypertension Heidi Robinson's blood pressure has been well controlled on her medications in the past (no blood pressure log today due to virtual visit). She requests a refill today. Her recent GFR is within normal limits.  2. Vitamin D deficiency Heidi Robinson's last Vit D level was at goal, and she denies nausea, vomiting, or muscle weakness.  3. Other iron deficiency anemia Heidi Robinson's anemia is still not controlled, but she is asymptomatic on FeSo4 supplement. She denies change, and she requests a refill today.  4. Pre-diabetes Heidi Robinson is stable on metformin, and she has a normal GFR and well controlled A1c.  5. Other depression with emotional eating Heidi Robinson is stable on  Topamax, and she is working on decreasing emotional eating behaviors. She feels she sis reasonably well even over the hoildays.  6. At risk for impaired metabolic function Heidi Robinson is at increased risk for impaired metabolic function due to decreased activity and decreased food intake while sick.  Assessment/Plan:   1. Essential hypertension Heidi Robinson is working on healthy weight loss and exercise to improve blood pressure control. We will watch for signs of hypotension as she continues her lifestyle modifications. We will refill lisinopril-hydrochlorothiazide for 1 month.  - lisinopril-hydrochlorothiazide (ZESTORETIC) 10-12.5 MG tablet; Take 1 tablet by mouth daily. Take 1 tablet by mouth daily.  Dispense: 30 tablet; Refill: 0  2. Vitamin D deficiency Low Vitamin D level contributes to fatigue and are associated with obesity, breast, and colon cancer. We will refill prescription Vitamin D for 1 month. Heidi Robinson will follow-up for routine testing of Vitamin D, at least 2-3 times per year to avoid over-replacement.  - Vitamin D, Ergocalciferol, (DRISDOL) 1.25 MG (50000 UNIT) CAPS capsule; Take 1 capsule (50,000 Units total) by mouth every 7 (seven) days.  Dispense: 4 capsule; Refill: 0  3. Other iron deficiency anemia Heidi Robinson agreed to continue FeSo4 and we will refill for 1 month. Orders and follow up as documented in patient record.  - ferrous sulfate 325 (65 FE) MG tablet; Take 1 tablet (325 mg total) by mouth daily with breakfast.  Dispense: 30 tablet; Refill: 0  4. Pre-diabetes Heidi Robinson will continue to work on weight loss, exercise, and decreasing simple carbohydrates to help decrease the risk of diabetes. We will refill metformin  for 1 month.  - metFORMIN (GLUCOPHAGE) 500 MG tablet; Take 1 tablet (500 mg total) by mouth 3 (three) times daily.  Dispense: 90 tablet; Refill: 0  5. Other depression with emotional eating Behavior modification techniques were discussed today to help Heidi Robinson deal with her  emotional/non-hunger eating behaviors. We will refill Topamax for 1 month. Orders and follow up as documented in patient record.   - topiramate (TOPAMAX) 50 MG tablet; Take 1 tablet (50 mg total) by mouth at bedtime.  Dispense: 30 tablet; Refill: 0  6. At risk for impaired metabolic function Heidi Robinson was given approximately 15 minutes of impaired  metabolic function prevention counseling today. We discussed intensive lifestyle modifications today with an emphasis on specific nutrition and exercise instructions and strategies.   Repetitive spaced learning was employed today to elicit superior memory formation and behavioral change.  7. Class 3 severe obesity with serious comorbidity and body mass index (BMI) of 40.0 to 44.9 in adult, unspecified obesity type (HCC) Heidi Robinson is currently in the action stage of change. As such, her goal is to continue with weight loss efforts. She has agreed to practicing portion control and making smarter food choices, such as increasing vegetables and decreasing simple carbohydrates.   Heidi Robinson was encouraged to increase her water and try to eat something while recovering. She will get back to something more structured when she feels better.  Behavioral modification strategies: increasing water intake and decreasing alcohol intake.  Heidi Robinson has agreed to follow-up with our clinic in 3 to 4 weeks. She was informed of the importance of frequent follow-up visits to maximize her success with intensive lifestyle modifications for her multiple health conditions.  Objective:   VITALS: Per patient if applicable, see vitals. GENERAL: Alert and in no acute distress. CARDIOPULMONARY: No increased WOB. Speaking in clear sentences.  PSYCH: Pleasant and cooperative. Speech normal rate and rhythm. Affect is appropriate. Insight and judgement are appropriate. Attention is focused, linear, and appropriate.  NEURO: Oriented as arrived to appointment on time with no prompting.   Lab Results   Component Value Date   CREATININE 0.87 07/30/2020   BUN 8 07/30/2020   NA 138 07/30/2020   K 4.0 07/30/2020   CL 99 07/30/2020   CO2 24 07/30/2020   Lab Results  Component Value Date   ALT 10 07/30/2020   AST 10 07/30/2020   ALKPHOS 77 07/30/2020   BILITOT <0.2 07/30/2020   Lab Results  Component Value Date   HGBA1C 5.2 07/30/2020   HGBA1C 5.0 03/11/2020   HGBA1C 4.9 09/01/2019   HGBA1C 5.2 05/11/2018   HGBA1C 5.7 (H) 12/21/2017   Lab Results  Component Value Date   INSULIN 21.0 07/30/2020   INSULIN 17.3 03/11/2020   INSULIN 13.1 09/01/2019   INSULIN 16.7 05/11/2018   INSULIN 23.4 12/21/2017   Lab Results  Component Value Date   TSH 1.750 03/11/2020   Lab Results  Component Value Date   CHOL 150 03/11/2020   HDL 44 03/11/2020   LDLCALC 89 03/11/2020   TRIG 87 03/11/2020   CHOLHDL 3.8 08/04/2017   Lab Results  Component Value Date   WBC 10.7 07/30/2020   HGB 10.5 (L) 07/30/2020   HCT 33.5 (L) 07/30/2020   MCV 70 (L) 07/30/2020   PLT 463 (H) 07/30/2020   Lab Results  Component Value Date   IRON 25 (L) 09/01/2019   TIBC 367 09/01/2019   FERRITIN 13 (L) 09/01/2019    Attestation Statements:   Reviewed by clinician  on day of visit: allergies, medications, problem list, medical history, surgical history, family history, social history, and previous encounter notes.   I, Trixie Dredge, am acting as transcriptionist for Dennard Nip, MD.  I have reviewed the above documentation for accuracy and completeness, and I agree with the above. - Dennard Nip, MD

## 2020-11-22 ENCOUNTER — Ambulatory Visit: Payer: Self-pay

## 2020-12-11 ENCOUNTER — Other Ambulatory Visit: Payer: Self-pay

## 2020-12-11 ENCOUNTER — Ambulatory Visit
Admission: RE | Admit: 2020-12-11 | Discharge: 2020-12-11 | Disposition: A | Discharge status: Home / Self Care | Payer: No Typology Code available for payment source | Source: Ambulatory Visit | Attending: Obstetrics and Gynecology | Admitting: Obstetrics and Gynecology

## 2020-12-11 DIAGNOSIS — Z1231 Encounter for screening mammogram for malignant neoplasm of breast: Secondary | ICD-10-CM

## 2020-12-13 ENCOUNTER — Other Ambulatory Visit: Payer: Self-pay

## 2020-12-13 ENCOUNTER — Ambulatory Visit (INDEPENDENT_AMBULATORY_CARE_PROVIDER_SITE_OTHER): Payer: No Typology Code available for payment source | Admitting: Family Medicine

## 2020-12-13 ENCOUNTER — Encounter: Payer: Self-pay | Admitting: Family Medicine

## 2020-12-13 VITALS — BP 109/72 | HR 105 | Temp 98.1°F | Resp 18 | Ht 62.0 in | Wt 222.2 lb

## 2020-12-13 DIAGNOSIS — Z0001 Encounter for general adult medical examination with abnormal findings: Secondary | ICD-10-CM

## 2020-12-13 DIAGNOSIS — D508 Other iron deficiency anemias: Secondary | ICD-10-CM | POA: Diagnosis not present

## 2020-12-13 DIAGNOSIS — K5909 Other constipation: Secondary | ICD-10-CM

## 2020-12-13 DIAGNOSIS — I10 Essential (primary) hypertension: Secondary | ICD-10-CM

## 2020-12-13 DIAGNOSIS — Z6839 Body mass index (BMI) 39.0-39.9, adult: Secondary | ICD-10-CM

## 2020-12-13 DIAGNOSIS — Z0000 Encounter for general adult medical examination without abnormal findings: Secondary | ICD-10-CM

## 2020-12-13 DIAGNOSIS — D573 Sickle-cell trait: Secondary | ICD-10-CM

## 2020-12-13 NOTE — Progress Notes (Addendum)
Patient ID: Heidi Robinson, female    DOB: [birth year]  Age: 45 y.o. MRN: [id number]  Chief Complaint  Patient presents with  . Annual Exam    Pt here for physical today, pt is fasting for lab work, no concerns     Subjective:   45 year old lady here for her annual physical examination.  It was just the time of year to get that done.  She did not have any major problems or acute complaints.  A month ago she had COVID-19 so she is planning to wait the 13-month period of time before getting her booster vaccine.  She has recovered well.  Last medical history: Operations: In 1997 had a tonsillectomy In 2019 had breast reduction surgery.  She has been happy with that. Gravida 2 para 2 with children 70 and 7 years of age Major medical illnesses: She has struggled with obesity. She has intermittent asthma She has chronic anemia and does have sickle trait. Hypertension Has been told she was prediabetic Medications: See list Allergies: Penicillin caused anaphylaxis  Family history: Father is 65 in good health has high cholesterol.  Mother is 74 in good health except for obesity and hypertension.  Grandmother had bladder cancer.  Social history: She works for [employer] from home.  She has been doing that for a number of years.  She has her younger child at home and the older child lives out of the home.  She has a 75-year-old grandchild.  She is married for over 15 years.  Her husband works over at [employer].  She likes to play some basketball.  She is involved in church.  She spends time with the family, likes to read, and is getting into selling real estate.  She does not get enough regular exercise.  Review of systems: Constitutional: Unremarkable HEENT: Unremarkable.  Sees a dentist. Cardiovascular: Normal Respiratory: Normal GI: Mild constipation GU: Unremarkable except for heavy periods Musculoskeletal: Some knee pains, have improved since she has lost about 40 pounds Neurologic:  Unremarkable Psychiatric: Has had some depression and her medications help on that Endocrinologic: Has been told she had low vitamin D though the levels look normal.  Current allergies, medications, problem list, past/family and social histories reviewed.  Objective:  BP 109/72   Pulse (!) 105   Temp 98.1 F (36.7 C) (Temporal)   Resp 18   Ht 5\' 2"  (1.575 m)   Wt 222 lb 3.2 oz (100.8 kg)   LMP 11/20/2020 (Approximate)   SpO2 100%   BMI 40.64 kg/m  Pleasant lady alert and oriented.  A little overweight.  No acute distress.  TMs normal.  Eyes PERRL.  Teeth look good.  Neck supple without nodes or thyromegaly.  No carotid bruits.  Chest is clear to auscultation.  Heart rate without murmurs, gallops, or arrhythmias.  Abdomen soft without mass or tenderness.  No axillary or inguinal nodes.  Good femoral pulses and no abdominal bruits.  Extremities unremarkable.  Posterior tibialis pulses palpable.  Skin normal.  Assessment & Plan:   Assessment: 1. Annual physical exam   2. Essential hypertension   3. Other iron deficiency anemia   4. Class 2 severe obesity with serious comorbidity and body mass index (BMI) of 39.0 to 39.9 in adult, unspecified obesity type (HCC)   5. Other constipation   6. Hemoglobin S (Hb-S) trait (HCC)       Plan: Fairly normal physical examination.  See instructions.  Orders Placed This Encounter  Procedures  .  Lipid panel  . TSH  . Comprehensive metabolic panel  . CBC with Differential/Platelet  . Hemoglobin A1c  . Hepatitis C antibody    No orders of the defined types were placed in this encounter.        Patient Instructions    Overall you appear to be in good health with no major concerns.  As previously discussed, in a couple of months she should get your COVID-19 booster vaccination.  Try to get more regular exercise and continue to try to watch what you eat.  Same medications.  Follow-up with your GYN doctor for your regular  pelvic and breast examinations.  See your primary care doctor if problems arise.  Unfortunately, [place] is closing Primary Care at [facility] and they will be sending out a notice sometime in the near future I understand.  There are many practices in the [place] and [place] and you can find yourself another primary care at 1 of those practices.  If you have lab work done today you will be contacted with your lab results within the next 2 weeks.  If you have not heard from [place] then please contact us. The fastest way to get your results is to register for My Chart.   IF you received an x-ray today, you will receive an invoice from [facility]. Please contact [facility] at [phone] with questions or concerns regarding your invoice.   IF you received labwork today, you will receive an invoice from [place]. Please contact [facility] at [phone] with questions or concerns regarding your invoice.   Our billing staff will not be able to assist you with questions regarding bills from these companies.  You will be contacted with the lab results as soon as they are available. The fastest way to get your results is to activate your My Chart account. Instructions are located on the last page of this paperwork. If you have not heard from [place] regarding the results in 2 weeks, please contact this office.        Return in about 1 year (around 12/13/2021), or See your new primary care at that time.   Ruben Reason, MD 12/14/2020

## 2020-12-13 NOTE — Patient Instructions (Addendum)
  Overall you appear to be in good health with no major concerns.  As previously discussed, in a couple of months she should get your COVID-19 booster vaccination.  Try to get more regular exercise and continue to try to watch what you eat.  Same medications.  Follow-up with your GYN doctor for your regular pelvic and breast examinations.  See your primary care doctor if problems arise.  Unfortunately, [place] is closing Primary Care at [facility] and they will be sending out a notice sometime in the near future I understand.  There are many practices in the [place] and [place] and you can find yourself another primary care at 1 of those practices.  If you have lab work done today you will be contacted with your lab results within the next 2 weeks.  If you have not heard from [place] then please contact us. The fastest way to get your results is to register for My Chart.   IF you received an x-ray today, you will receive an invoice from [facility]. Please contact [facility] at [phone] with questions or concerns regarding your invoice.   IF you received labwork today, you will receive an invoice from [place]. Please contact [facility] at [phone] with questions or concerns regarding your invoice.   Our billing staff will not be able to assist you with questions regarding bills from these companies.  You will be contacted with the lab results as soon as they are available. The fastest way to get your results is to activate your My Chart account. Instructions are located on the last page of this paperwork. If you have not heard from [place] regarding the results in 2 weeks, please contact this office.

## 2020-12-14 ENCOUNTER — Encounter: Payer: Self-pay | Admitting: Family Medicine

## 2020-12-14 LAB — COMPREHENSIVE METABOLIC PANEL
ALT: 11 IU/L (ref 0–32)
AST: 14 IU/L (ref 0–40)
Albumin/Globulin Ratio: 2 (ref 1.2–2.2)
Albumin: 4.7 g/dL (ref 3.8–4.8)
Alkaline Phosphatase: 80 IU/L (ref 44–121)
BUN/Creatinine Ratio: 12 (ref 9–23)
BUN: 11 mg/dL (ref 6–24)
Bilirubin Total: 0.2 mg/dL (ref 0.0–1.2)
CO2: 24 mmol/L (ref 20–29)
Calcium: 9.6 mg/dL (ref 8.7–10.2)
Chloride: 101 mmol/L (ref 96–106)
Creatinine, Ser: 0.89 mg/dL (ref 0.57–1.00)
GFR calc Af Amer: 91 mL/min/{1.73_m2} (ref >59)
GFR calc non Af Amer: 79 mL/min/{1.73_m2} (ref >59)
Globulin, Total: 2.3 g/dL (ref 1.5–4.5)
Glucose: 85 mg/dL (ref 65–99)
Potassium: 4.1 mmol/L (ref 3.5–5.2)
Sodium: 138 mmol/L (ref 134–144)
Total Protein: 7 g/dL (ref 6.0–8.5)

## 2020-12-14 LAB — CBC WITH DIFFERENTIAL/PLATELET
Basophils Absolute: 0.1 10*3/uL (ref 0.0–0.2)
Basos: 1 %
EOS (ABSOLUTE): 0.4 10*3/uL (ref 0.0–0.4)
Eos: 4 %
Hematocrit: 34 % (ref 34.0–46.6)
Hemoglobin: 10.2 g/dL — ABNORMAL LOW (ref 11.1–15.9)
Immature Grans (Abs): 0 10*3/uL (ref 0.0–0.1)
Immature Granulocytes: 0 %
Lymphocytes Absolute: 1.7 10*3/uL (ref 0.7–3.1)
Lymphs: 18 %
MCH: 20.4 pg — ABNORMAL LOW (ref 26.6–33.0)
MCHC: 30 g/dL — ABNORMAL LOW (ref 31.5–35.7)
MCV: 68 fL — ABNORMAL LOW (ref 79–97)
Monocytes Absolute: 0.5 10*3/uL (ref 0.1–0.9)
Monocytes: 5 %
Neutrophils Absolute: 6.8 10*3/uL (ref 1.4–7.0)
Neutrophils: 72 %
Platelets: 471 10*3/uL — ABNORMAL HIGH (ref 150–450)
RBC: 4.99 x10E6/uL (ref 3.77–5.28)
RDW: 16.6 % — ABNORMAL HIGH (ref 11.7–15.4)
WBC: 9.5 10*3/uL (ref 3.4–10.8)

## 2020-12-14 LAB — LIPID PANEL
Chol/HDL Ratio: 3.7 ratio (ref 0.0–4.4)
Cholesterol, Total: 174 mg/dL (ref 100–199)
HDL: 47 mg/dL (ref >39)
LDL Chol Calc (NIH): 110 mg/dL — ABNORMAL HIGH (ref 0–99)
Triglycerides: 94 mg/dL (ref 0–149)
VLDL Cholesterol Cal: 17 mg/dL (ref 5–40)

## 2020-12-14 LAB — TSH: TSH: 2.31 u[IU]/mL (ref 0.450–4.500)

## 2020-12-14 LAB — HEMOGLOBIN A1C
Est. average glucose Bld gHb Est-mCnc: 91 mg/dL
Hgb A1c MFr Bld: 4.8 % (ref 4.8–5.6)

## 2020-12-14 LAB — HEPATITIS C ANTIBODY: Hep C Virus Ab: <0.1 s/co ratio (ref 0.0–0.9)

## 2020-12-14 NOTE — Progress Notes (Signed)
Call:    Your labs are all good except the low hemoglobin (red blood level) which is probably related to your bone marrow type being high in S hemoglobin (sickle cell carrier, not sickle cell disease).  Make sure you take some iron containing vitamin.  If too week or tired you might wish to see a hematologist (blood specialist) sometime if you have not seen one in the past.  Let us know and we can refer you.  There may not much else the specialist can do to change things since this is  chronic condition in you.  Ruben Reason MD

## 2020-12-16 ENCOUNTER — Other Ambulatory Visit: Payer: Self-pay

## 2020-12-16 DIAGNOSIS — R7303 Prediabetes: Secondary | ICD-10-CM

## 2020-12-16 DIAGNOSIS — I10 Essential (primary) hypertension: Secondary | ICD-10-CM

## 2020-12-16 MED ORDER — METFORMIN HCL 500 MG PO TABS: 500.0000 mg | ORAL_TABLET | Freq: Three times a day (TID) | ORAL | 1 refills | Status: DC

## 2020-12-16 MED ORDER — LISINOPRIL-HYDROCHLOROTHIAZIDE 10-12.5 MG PO TABS: 1.0000 | ORAL_TABLET | Freq: Every day | ORAL | 1 refills | Status: DC

## 2020-12-17 ENCOUNTER — Telehealth (INDEPENDENT_AMBULATORY_CARE_PROVIDER_SITE_OTHER): Payer: Self-pay | Admitting: Family Medicine

## 2020-12-17 NOTE — Telephone Encounter (Signed)
Patient needs all her refills that Dr. Leafy Ro usually prescribes her.  She would like to know when they are called in.  She does have an appt scheduled on 3/7.

## 2020-12-17 NOTE — Telephone Encounter (Signed)
Would you like to refill these medications?  

## 2020-12-18 ENCOUNTER — Encounter (INDEPENDENT_AMBULATORY_CARE_PROVIDER_SITE_OTHER): Payer: Self-pay | Admitting: Family Medicine

## 2020-12-18 NOTE — Telephone Encounter (Signed)
She is requesting Wellbutrin, Vitamin D, Miralax, Topamax, and Iron.

## 2020-12-18 NOTE — Telephone Encounter (Signed)
Which one(s)?

## 2020-12-18 NOTE — Telephone Encounter (Signed)
Last OV with Dr. Beasley 

## 2020-12-19 ENCOUNTER — Other Ambulatory Visit (INDEPENDENT_AMBULATORY_CARE_PROVIDER_SITE_OTHER): Payer: Self-pay

## 2020-12-19 DIAGNOSIS — D508 Other iron deficiency anemias: Secondary | ICD-10-CM

## 2020-12-19 DIAGNOSIS — F3289 Other specified depressive episodes: Secondary | ICD-10-CM

## 2020-12-19 DIAGNOSIS — E559 Vitamin D deficiency, unspecified: Secondary | ICD-10-CM

## 2020-12-19 MED ORDER — FERROUS SULFATE 325 (65 FE) MG PO TABS: 325.0000 mg | ORAL_TABLET | Freq: Every day | ORAL | 0 refills | Status: DC

## 2020-12-19 MED ORDER — BUPROPION HCL ER (SR) 200 MG PO TB12: 200.0000 mg | ORAL_TABLET | Freq: Two times a day (BID) | ORAL | 0 refills | Status: DC

## 2020-12-19 MED ORDER — VITAMIN D (ERGOCALCIFEROL) 1.25 MG (50000 UNIT) PO CAPS: 50000.0000 [IU] | ORAL_CAPSULE | ORAL | 0 refills | Status: DC

## 2020-12-19 MED ORDER — TOPIRAMATE 50 MG PO TABS: 50.0000 mg | ORAL_TABLET | Freq: Every day | ORAL | 0 refills | Status: DC

## 2020-12-19 NOTE — Telephone Encounter (Signed)
Ok x 1

## 2020-12-23 ENCOUNTER — Telehealth (INDEPENDENT_AMBULATORY_CARE_PROVIDER_SITE_OTHER): Payer: Self-pay

## 2020-12-23 NOTE — Telephone Encounter (Signed)
Error

## 2021-01-13 ENCOUNTER — Encounter (INDEPENDENT_AMBULATORY_CARE_PROVIDER_SITE_OTHER): Payer: Self-pay | Admitting: Family Medicine

## 2021-01-13 ENCOUNTER — Other Ambulatory Visit: Payer: Self-pay

## 2021-01-13 ENCOUNTER — Ambulatory Visit (INDEPENDENT_AMBULATORY_CARE_PROVIDER_SITE_OTHER): Payer: No Typology Code available for payment source | Admitting: Family Medicine

## 2021-01-13 VITALS — BP 118/81 | HR 98 | Temp 97.9°F | Ht 62.0 in | Wt 223.0 lb

## 2021-01-13 DIAGNOSIS — K5909 Other constipation: Secondary | ICD-10-CM | POA: Diagnosis not present

## 2021-01-13 DIAGNOSIS — R7303 Prediabetes: Secondary | ICD-10-CM | POA: Diagnosis not present

## 2021-01-13 DIAGNOSIS — Z9189 Other specified personal risk factors, not elsewhere classified: Secondary | ICD-10-CM | POA: Diagnosis not present

## 2021-01-13 DIAGNOSIS — E559 Vitamin D deficiency, unspecified: Secondary | ICD-10-CM | POA: Diagnosis not present

## 2021-01-13 DIAGNOSIS — F3289 Other specified depressive episodes: Secondary | ICD-10-CM

## 2021-01-13 DIAGNOSIS — Z6841 Body Mass Index (BMI) 40.0 and over, adult: Secondary | ICD-10-CM

## 2021-01-13 DIAGNOSIS — E66813 Obesity, class 3: Secondary | ICD-10-CM

## 2021-01-13 DIAGNOSIS — D508 Other iron deficiency anemias: Secondary | ICD-10-CM

## 2021-01-13 MED ORDER — BUPROPION HCL ER (SR) 200 MG PO TB12: 200.0000 mg | ORAL_TABLET | Freq: Two times a day (BID) | ORAL | 0 refills | Status: DC

## 2021-01-13 MED ORDER — VITAMIN D (ERGOCALCIFEROL) 1.25 MG (50000 UNIT) PO CAPS: 50000.0000 [IU] | ORAL_CAPSULE | ORAL | 0 refills | Status: DC

## 2021-01-13 MED ORDER — TOPIRAMATE 50 MG PO TABS: 50.0000 mg | ORAL_TABLET | Freq: Every day | ORAL | 0 refills | Status: DC

## 2021-01-13 MED ORDER — POLYETHYLENE GLYCOL 3350 17 GM/SCOOP PO POWD: 17.0000 g | Freq: Every day | ORAL | 0 refills | Status: DC

## 2021-01-13 MED ORDER — FERROUS SULFATE 325 (65 FE) MG PO TABS: 325.0000 mg | ORAL_TABLET | Freq: Every day | ORAL | 0 refills | Status: DC

## 2021-01-13 MED ORDER — METFORMIN HCL 500 MG PO TABS: 500.0000 mg | ORAL_TABLET | Freq: Three times a day (TID) | ORAL | 1 refills | Status: DC

## 2021-01-14 LAB — VITAMIN D 25 HYDROXY (VIT D DEFICIENCY, FRACTURES): Vit D, 25-Hydroxy: 78.7 ng/mL (ref 30.0–100.0)

## 2021-01-14 NOTE — Progress Notes (Signed)
Chief Complaint:   OBESITY Heidi Robinson is here to discuss her progress with her obesity treatment plan along with follow-up of her obesity related diagnoses. Heidi Robinson is on practicing portion control and making smarter food choices, such as increasing vegetables and decreasing simple carbohydrates and states she is following her eating plan approximately 0% of the time. Heidi Robinson states she is walking and doing strengthening for 30+-45 minutes 2-4 times per week.  Today's visit was #: 56 Starting weight: 258 lbs Starting date: 12/21/2017 Today's weight: 223 lbs Today's date: 01/13/2021 Total lbs lost to date: 35 Total lbs lost since last in-office visit: 0  Interim History: Heidi Robinson has been working very long hours and she hasn't been able to prioritize meal planning and weight loss as much. She is sleeping approximately 5 hours most nights, and very tired. She can feel she has gained weight, and she is ready to work on getting back on track.  Subjective:   1. Pre-diabetes Heidi Robinson is not always remembering to take all doses of her metformin, and her work schedule and study schedule have increased.  2. Other constipation Heidi Robinson is doing well on miralax, and she requests a refill today.  3. Vitamin D deficiency Heidi Robinson's last Vit D level was at goal, an she is at risk of over-replacement. She denies nausea, vomiting, or muscle weakness.  4. Other iron deficiency anemia Heidi Robinson is stable on FeSo4, and she is taking miralax to help decrease constipation.  5. Other depression with emotional eating Heidi Robinson's stress level has increased and she hasn't been able to concentrate on decreasing emotional eating as much as previously. She denies worsening insomnia, and her blood pressure is stable.  6. At risk for impaired metabolic function Heidi Robinson is at increased risk for impaired metabolic function due to decreased sleep and decreased protein.  Assessment/Plan:   1. Pre-diabetes Heidi Robinson will continue to work on weight loss,  diet, exercise, and decreasing simple carbohydrates to help decrease the risk of diabetes. We will refill metformin for 90 days with 1 refill.  - metFORMIN (GLUCOPHAGE) 500 MG tablet; Take 1 tablet (500 mg total) by mouth 3 (three) times daily.  Dispense: 270 tablet; Refill: 1  2. Other constipation Heidi Robinson was informed that a decrease in bowel movement frequency is normal while losing weight, but stools should not be hard or painful. Heidi Robinson will continue miralax 17 g daily with no refills. Orders and follow up as documented in patient record.   - polyethylene glycol powder (GLYCOLAX/MIRALAX) 17 GM/SCOOP powder; Take 17 g by mouth daily.  Dispense: 3350 g; Refill: 0  3. Vitamin D deficiency Low Vitamin D level contributes to fatigue and are associated with obesity, breast, and colon cancer. We will refill prescription Vitamin D for 1 month, and we will check labs today. Heidi Robinson will follow-up for routine testing of Vitamin D, at least 2-3 times per year to avoid over-replacement.  - VITAMIN D 25 Hydroxy (Vit-D Deficiency, Fractures) - Vitamin D, Ergocalciferol, (DRISDOL) 1.25 MG (50000 UNIT) CAPS capsule; Take 1 capsule (50,000 Units total) by mouth every 7 (seven) days.  Dispense: 4 capsule; Refill: 0  4. Other iron deficiency anemia We will refill FeSo4 for 1 month. Orders and follow up as documented in patient record.  - ferrous sulfate 325 (65 FE) MG tablet; Take 1 tablet (325 mg total) by mouth daily with breakfast.  Dispense: 30 tablet; Refill: 0  5. Other depression with emotional eating Behavior modification techniques were discussed today to help Heidi Robinson  deal with her emotional/non-hunger eating behaviors. We will refill Tpamax and Wellbutrin SR for 1 month Orders and follow up as documented in patient record.   - topiramate (TOPAMAX) 50 MG tablet; Take 1 tablet (50 mg total) by mouth at bedtime.  Dispense: 30 tablet; Refill: 0 - buPROPion (WELLBUTRIN SR) 200 MG 12 hr tablet; Take 1 tablet (200  mg total) by mouth 2 (two) times daily.  Dispense: 60 tablet; Refill: 0  6. At risk for impaired metabolic function Heidi Robinson was given approximately 15 minutes of impaired  metabolic function prevention counseling today. We discussed intensive lifestyle modifications today with an emphasis on specific nutrition and exercise instructions and strategies.   Repetitive spaced learning was employed today to elicit superior memory formation and behavioral change.  7. Class 3 severe obesity with serious comorbidity and body mass index (BMI) of 40.0 to 44.9 in adult, unspecified obesity type (HCC) Heidi Robinson is currently in the action stage of change. As such, her goal is to continue with weight loss efforts. She has agreed to the Category 2 Plan with breakfast and lunch options.   Exercise goals: As is.  Behavioral modification strategies: increasing lean protein intake and meal planning and cooking strategies.  Heidi Robinson has agreed to follow-up with our clinic in 4 weeks. She was informed of the importance of frequent follow-up visits to maximize her success with intensive lifestyle modifications for her multiple health conditions.   Heidi Robinson was informed we would discuss her lab results at her next visit unless there is a critical issue that needs to be addressed sooner. Heidi Robinson agreed to keep her next visit at the agreed upon time to discuss these results.  Objective:   Blood pressure 118/81, pulse 98, temperature 97.9 F (36.6 C), temperature source Oral, height 5\' 2"  (1.575 m), weight 223 lb (101.2 kg), SpO2 99 %. Body mass index is 40.79 kg/m.  General: Cooperative, alert, well developed, in no acute distress. HEENT: Conjunctivae and lids unremarkable. Cardiovascular: Regular rhythm.  Lungs: Normal work of breathing. Neurologic: No focal deficits.   Lab Results  Component Value Date   CREATININE 0.89 12/13/2020   BUN 11 12/13/2020   NA 138 12/13/2020   K 4.1 12/13/2020   CL 101 12/13/2020   CO2 24  12/13/2020   Lab Results  Component Value Date   ALT 11 12/13/2020   AST 14 12/13/2020   ALKPHOS 80 12/13/2020   BILITOT 0.2 12/13/2020   Lab Results  Component Value Date   HGBA1C 4.8 12/13/2020   HGBA1C 5.2 07/30/2020   HGBA1C 5.0 03/11/2020   HGBA1C 4.9 09/01/2019   HGBA1C 5.2 05/11/2018   Lab Results  Component Value Date   INSULIN 21.0 07/30/2020   INSULIN 17.3 03/11/2020   INSULIN 13.1 09/01/2019   INSULIN 16.7 05/11/2018   INSULIN 23.4 12/21/2017   Lab Results  Component Value Date   TSH 2.310 12/13/2020   Lab Results  Component Value Date   CHOL 174 12/13/2020   HDL 47 12/13/2020   LDLCALC 110 (H) 12/13/2020   TRIG 94 12/13/2020   CHOLHDL 3.7 12/13/2020   Lab Results  Component Value Date   WBC 9.5 12/13/2020   HGB 10.2 (L) 12/13/2020   HCT 34.0 12/13/2020   MCV 68 (L) 12/13/2020   PLT 471 (H) 12/13/2020   Lab Results  Component Value Date   IRON 25 (L) 09/01/2019   TIBC 367 09/01/2019   FERRITIN 13 (L) 09/01/2019   Attestation Statements:   Reviewed by  clinician on day of visit: allergies, medications, problem list, medical history, surgical history, family history, social history, and previous encounter notes.   I, Trixie Dredge, am acting as transcriptionist for Dennard Nip, MD.  I have reviewed the above documentation for accuracy and completeness, and I agree with the above. -  Dennard Nip, MD

## 2021-02-10 ENCOUNTER — Encounter (INDEPENDENT_AMBULATORY_CARE_PROVIDER_SITE_OTHER): Payer: Self-pay | Admitting: Family Medicine

## 2021-02-10 ENCOUNTER — Ambulatory Visit (INDEPENDENT_AMBULATORY_CARE_PROVIDER_SITE_OTHER): Payer: No Typology Code available for payment source | Admitting: Family Medicine

## 2021-02-10 ENCOUNTER — Other Ambulatory Visit: Payer: Self-pay

## 2021-02-10 VITALS — BP 116/81 | HR 94 | Temp 98.1°F | Ht 62.0 in | Wt 221.0 lb

## 2021-02-10 DIAGNOSIS — D508 Other iron deficiency anemias: Secondary | ICD-10-CM | POA: Diagnosis not present

## 2021-02-10 DIAGNOSIS — Z9189 Other specified personal risk factors, not elsewhere classified: Secondary | ICD-10-CM | POA: Diagnosis not present

## 2021-02-10 DIAGNOSIS — E66813 Obesity, class 3: Secondary | ICD-10-CM

## 2021-02-10 DIAGNOSIS — R7303 Prediabetes: Secondary | ICD-10-CM

## 2021-02-10 DIAGNOSIS — E559 Vitamin D deficiency, unspecified: Secondary | ICD-10-CM | POA: Diagnosis not present

## 2021-02-10 DIAGNOSIS — Z6841 Body Mass Index (BMI) 40.0 and over, adult: Secondary | ICD-10-CM

## 2021-02-10 DIAGNOSIS — F3289 Other specified depressive episodes: Secondary | ICD-10-CM

## 2021-02-10 MED ORDER — METFORMIN HCL 500 MG PO TABS: 500.0000 mg | ORAL_TABLET | Freq: Three times a day (TID) | ORAL | 1 refills | Status: DC

## 2021-02-10 MED ORDER — TOPIRAMATE 100 MG PO TABS: 100.0000 mg | ORAL_TABLET | Freq: Every day | ORAL | 0 refills | Status: DC

## 2021-02-10 MED ORDER — FERROUS SULFATE 325 (65 FE) MG PO TABS
325.0000 mg | ORAL_TABLET | Freq: Every day | ORAL | 0 refills | Status: DC
Start: 2021-02-10 — End: 2021-03-11

## 2021-02-10 MED ORDER — VITAMIN D (ERGOCALCIFEROL) 1.25 MG (50000 UNIT) PO CAPS: 50000.0000 [IU] | ORAL_CAPSULE | ORAL | 0 refills | Status: DC

## 2021-02-10 MED ORDER — BUPROPION HCL ER (SR) 200 MG PO TB12: 200.0000 mg | ORAL_TABLET | Freq: Two times a day (BID) | ORAL | 0 refills | Status: DC

## 2021-02-12 ENCOUNTER — Encounter: Payer: No Typology Code available for payment source | Admitting: Family Medicine

## 2021-02-25 NOTE — Progress Notes (Signed)
Chief Complaint:   OBESITY Heidi Robinson is here to discuss her progress with her obesity treatment plan along with follow-up of her obesity related diagnoses. Jancy is on the Category 2 Plan with breakfast and lunch options and states she is following her eating plan approximately 20% of the time. Heidi Robinson states she is doing 0 minutes 0 times per week.  Today's visit was #: 36 Starting weight: 258 lbs Starting date: 12/21/2017 Today's weight: 221 lbs Today's date: 02/10/2021 Total lbs lost to date: 37 Total lbs lost since last in-office visit: 2  Interim History: Heidi Robinson has struggled more to stay on track this last month. She notes increased stress eating and decreased exercise due to a shoulder injury. She was pleasantly surprised that she lost weight this month.  Subjective:   1. Other iron deficiency anemia Heidi Robinson I stable on iron supplementations and she denies constipation.  2. Vitamin D deficiency Kenitra is tolerating Vit D well, and she denies nausea, vomiting, or muscle weakness.  3. Pre-diabetes Heidi Robinson is stable on metformin, and she denies nausea, vomiting, or hypoglycemia.  4. Other depression with emotional eating Heidi Robinson notes some increased emotional eating behaviors, and she feels her Topamax isn't working as well as it used to.  5. At risk for impaired metabolic function Heidi Robinson is at increased risk for impaired metabolic function if protein decreases.  Assessment/Plan:   1. Other iron deficiency anemia We will refill Feso4 for 1 month. Heidi Robinson will continue to follow up as directed. Orders and follow up as documented in patient record.  - ferrous sulfate 325 (65 FE) MG tablet; Take 1 tablet (325 mg total) by mouth daily with breakfast.  Dispense: 30 tablet; Refill: 0  2. Vitamin D deficiency Low Vitamin D level contributes to fatigue and are associated with obesity, breast, and colon cancer. We will refill prescription Vitamin D for 1 month. Heidi Robinson will follow-up for routine testing of  Vitamin D, at least 2-3 times per year to avoid over-replacement.  - Vitamin D, Ergocalciferol, (DRISDOL) 1.25 MG (50000 UNIT) CAPS capsule; Take 1 capsule (50,000 Units total) by mouth every 7 (seven) days.  Dispense: 4 capsule; Refill: 0  3. Pre-diabetes Heidi Robinson will continue to work on weight loss, exercise, and decreasing simple carbohydrates to help decrease the risk of diabetes. We will refill metformin for 90 days with 1 refill.  - metFORMIN (GLUCOPHAGE) 500 MG tablet; Take 1 tablet (500 mg total) by mouth 3 (three) times daily.  Dispense: 270 tablet; Refill: 1  4. Other depression with emotional eating Behavior modification techniques were discussed today to help Heidi Robinson deal with her emotional/non-hunger eating behaviors. Heidi Robinson agreed to increase Topamax to 100 mg qhs with no refills, and we will refill Wellbutrin SR for 1 month. Orders and follow up as documented in patient record.   - buPROPion (WELLBUTRIN SR) 200 MG 12 hr tablet; Take 1 tablet (200 mg total) by mouth 2 (two) times daily.  Dispense: 60 tablet; Refill: 0 - topiramate (TOPAMAX) 100 MG tablet; Take 1 tablet (100 mg total) by mouth at bedtime.  Dispense: 30 tablet; Refill: 0  5. At risk for impaired metabolic function Heidi Robinson was given approximately 15 minutes of impaired  metabolic function prevention counseling today. We discussed intensive lifestyle modifications today with an emphasis on specific nutrition and exercise instructions and strategies.   Repetitive spaced learning was employed today to elicit superior memory formation and behavioral change.  6. Obesity with current BMI of 40.5 Robbi is currently  in the action stage of change. As such, her goal is to continue with weight loss efforts. She has agreed to the Category 2 Plan.   Libertie was encouraged to make sure she is eating all of the food on her plan to help decrease temptations and cravings.  Behavioral modification strategies: no skipping meals and emotional  eating strategies.  Heidi Robinson has agreed to follow-up with our clinic in 3 to 4 weeks. She was informed of the importance of frequent follow-up visits to maximize her success with intensive lifestyle modifications for her multiple health conditions.   Objective:   Blood pressure 116/81, pulse 94, temperature 98.1 F (36.7 C), height 5\' 2"  (1.575 m), weight 221 lb (100.2 kg), SpO2 98 %. Body mass index is 40.42 kg/m.  General: Cooperative, alert, well developed, in no acute distress. HEENT: Conjunctivae and lids unremarkable. Cardiovascular: Regular rhythm.  Lungs: Normal work of breathing. Neurologic: No focal deficits.   Lab Results  Component Value Date   CREATININE 0.89 12/13/2020   BUN 11 12/13/2020   NA 138 12/13/2020   K 4.1 12/13/2020   CL 101 12/13/2020   CO2 24 12/13/2020   Lab Results  Component Value Date   ALT 11 12/13/2020   AST 14 12/13/2020   ALKPHOS 80 12/13/2020   BILITOT 0.2 12/13/2020   Lab Results  Component Value Date   HGBA1C 4.8 12/13/2020   HGBA1C 5.2 07/30/2020   HGBA1C 5.0 03/11/2020   HGBA1C 4.9 09/01/2019   HGBA1C 5.2 05/11/2018   Lab Results  Component Value Date   INSULIN 21.0 07/30/2020   INSULIN 17.3 03/11/2020   INSULIN 13.1 09/01/2019   INSULIN 16.7 05/11/2018   INSULIN 23.4 12/21/2017   Lab Results  Component Value Date   TSH 2.310 12/13/2020   Lab Results  Component Value Date   CHOL 174 12/13/2020   HDL 47 12/13/2020   LDLCALC 110 (H) 12/13/2020   TRIG 94 12/13/2020   CHOLHDL 3.7 12/13/2020   Lab Results  Component Value Date   WBC 9.5 12/13/2020   HGB 10.2 (L) 12/13/2020   HCT 34.0 12/13/2020   MCV 68 (L) 12/13/2020   PLT 471 (H) 12/13/2020   Lab Results  Component Value Date   IRON 25 (L) 09/01/2019   TIBC 367 09/01/2019   FERRITIN 13 (L) 09/01/2019   Attestation Statements:   Reviewed by clinician on day of visit: allergies, medications, problem list, medical history, surgical history, family history,  social history, and previous encounter notes.   I, Trixie Dredge, am acting as transcriptionist for Dennard Nip, MD.  I have reviewed the above documentation for accuracy and completeness, and I agree with the above. -  Dennard Nip, MD

## 2021-03-10 ENCOUNTER — Ambulatory Visit (INDEPENDENT_AMBULATORY_CARE_PROVIDER_SITE_OTHER): Payer: No Typology Code available for payment source | Admitting: Family Medicine

## 2021-03-11 ENCOUNTER — Encounter (INDEPENDENT_AMBULATORY_CARE_PROVIDER_SITE_OTHER): Payer: Self-pay | Admitting: Family Medicine

## 2021-03-11 ENCOUNTER — Ambulatory Visit (INDEPENDENT_AMBULATORY_CARE_PROVIDER_SITE_OTHER): Payer: No Typology Code available for payment source | Admitting: Family Medicine

## 2021-03-11 ENCOUNTER — Other Ambulatory Visit: Payer: Self-pay

## 2021-03-11 VITALS — BP 112/76 | HR 81 | Temp 97.6°F | Ht 62.0 in | Wt 227.0 lb

## 2021-03-11 DIAGNOSIS — Z6841 Body Mass Index (BMI) 40.0 and over, adult: Secondary | ICD-10-CM

## 2021-03-11 DIAGNOSIS — E559 Vitamin D deficiency, unspecified: Secondary | ICD-10-CM

## 2021-03-11 DIAGNOSIS — F3289 Other specified depressive episodes: Secondary | ICD-10-CM | POA: Diagnosis not present

## 2021-03-11 DIAGNOSIS — R7303 Prediabetes: Secondary | ICD-10-CM

## 2021-03-11 DIAGNOSIS — D508 Other iron deficiency anemias: Secondary | ICD-10-CM

## 2021-03-11 DIAGNOSIS — Z9189 Other specified personal risk factors, not elsewhere classified: Secondary | ICD-10-CM

## 2021-03-11 DIAGNOSIS — E66813 Obesity, class 3: Secondary | ICD-10-CM

## 2021-03-11 MED ORDER — VITAMIN D (ERGOCALCIFEROL) 1.25 MG (50000 UNIT) PO CAPS: 50000.0000 [IU] | ORAL_CAPSULE | ORAL | 0 refills | Status: DC

## 2021-03-11 MED ORDER — BUPROPION HCL ER (SR) 200 MG PO TB12: 200.0000 mg | ORAL_TABLET | Freq: Two times a day (BID) | ORAL | 0 refills | Status: DC

## 2021-03-11 MED ORDER — FERROUS SULFATE 325 (65 FE) MG PO TABS: 325.0000 mg | ORAL_TABLET | Freq: Every day | ORAL | 0 refills | Status: DC

## 2021-03-11 MED ORDER — TOPIRAMATE 100 MG PO TABS: 100.0000 mg | ORAL_TABLET | Freq: Every day | ORAL | 0 refills | Status: DC

## 2021-03-11 MED ORDER — METFORMIN HCL 500 MG PO TABS: 500.0000 mg | ORAL_TABLET | Freq: Three times a day (TID) | ORAL | 0 refills | Status: DC

## 2021-03-12 NOTE — Progress Notes (Signed)
Chief Complaint:   OBESITY Heidi Robinson is here to discuss her progress with her obesity treatment plan along with follow-up of her obesity related diagnoses. Heidi Robinson is on the Category 2 Plan and states she is following her eating plan approximately 0% of the time. Heidi Robinson states she is doing 0 minutes 0 times per week.  Today's visit was #: 26 Starting weight: 258 lbs Starting date: 12/21/2017 Today's weight: 227 lbs Today's date: 03/11/2021 Total lbs lost to date: 31 Total lbs lost since last in-office visit: 0  Interim History: Heidi Robinson has been overwhelmed with working multiple jobs and the frustration this brings. She hasn't been able to concentrate on weight loss or meal planning, but she has been doing more grab and go eating.  Subjective:   1. Pre-diabetes Heidi Robinson is working on diet, but she is struggling more recently. She has been off her normal routine and missed some doses.  2. Vitamin D deficiency Heidi Robinson is stable on Vit D, and she denies signs of over-replacement.  3. Other iron deficiency anemia Heidi Robinson is stable on FeSo4, and she requests a refill today.  4. Other depression with emotional eating Heidi Robinson is stable on her medications, and she requests a refill today. Her blood pressure is stable.  5. At risk for impaired metabolic function Heidi Robinson is at increased risk for impaired metabolic function due to current nutrition and muscle mass.  Assessment/Plan:   1. Pre-diabetes Vibha will continue to work on weight loss, diet, exercise, and decreasing simple carbohydrates to help decrease the risk of diabetes. We will refill metformin for 1 month.  - metFORMIN (GLUCOPHAGE) 500 MG tablet; Take 1 tablet (500 mg total) by mouth 3 (three) times daily.  Dispense: 90 tablet; Refill: 0  2. Vitamin D deficiency Low Vitamin D level contributes to fatigue and are associated with obesity, breast, and colon cancer. We will refill prescription Vitamin D for 1 month. Kenda will follow-up for routine  testing of Vitamin D, at least 2-3 times per year to avoid over-replacement.  - Vitamin D, Ergocalciferol, (DRISDOL) 1.25 MG (50000 UNIT) CAPS capsule; Take 1 capsule (50,000 Units total) by mouth every 7 (seven) days.  Dispense: 4 capsule; Refill: 0  3. Other iron deficiency anemia We will refill FeSo4 for 1 month, and Pharrah will continue to follow up as directed. Orders and follow up as documented in patient record.  - ferrous sulfate 325 (65 FE) MG tablet; Take 1 tablet (325 mg total) by mouth daily with breakfast.  Dispense: 30 tablet; Refill: 0  4. Other depression with emotional eating Behavior modification techniques were discussed today to help Gift deal with her emotional/non-hunger eating behaviors. We will refill Wellbutrin SR and Tomapax for 1 month. Orders and follow up as documented in patient record.   - buPROPion (WELLBUTRIN SR) 200 MG 12 hr tablet; Take 1 tablet (200 mg total) by mouth 2 (two) times daily.  Dispense: 60 tablet; Refill: 0 - topiramate (TOPAMAX) 100 MG tablet; Take 1 tablet (100 mg total) by mouth at bedtime.  Dispense: 30 tablet; Refill: 0  5. At risk for impaired metabolic function Heidi Robinson was given approximately 15 minutes of impaired  metabolic function prevention counseling today. We discussed intensive lifestyle modifications today with an emphasis on specific nutrition and exercise instructions and strategies.   Repetitive spaced learning was employed today to elicit superior memory formation and behavioral change.  6. Obesity with current BMI 41.6 Heidi Robinson is currently in the action stage of change. As  such, her goal is to continue with weight loss efforts. She has agreed to keeping a food journal and adhering to recommended goals of 1200-1400 calories and 80+ grams of protein daily.   Heidi Robinson was given "damage control" strategies and ideas for healthier grab and go food choices.  Behavioral modification strategies: increasing lean protein intake and meal  planning and cooking strategies.  Heidi Robinson has agreed to follow-up with our clinic in 4 weeks. She was informed of the importance of frequent follow-up visits to maximize her success with intensive lifestyle modifications for her multiple health conditions.   Objective:   Blood pressure 112/76, pulse 81, temperature 97.6 F (36.4 C), height 5\' 2"  (1.575 m), weight 227 lb (103 kg), SpO2 98 %. Body mass index is 41.52 kg/m.  General: Cooperative, alert, well developed, in no acute distress. HEENT: Conjunctivae and lids unremarkable. Cardiovascular: Regular rhythm.  Lungs: Normal work of breathing. Neurologic: No focal deficits.   Lab Results  Component Value Date   CREATININE 0.89 12/13/2020   BUN 11 12/13/2020   NA 138 12/13/2020   K 4.1 12/13/2020   CL 101 12/13/2020   CO2 24 12/13/2020   Lab Results  Component Value Date   ALT 11 12/13/2020   AST 14 12/13/2020   ALKPHOS 80 12/13/2020   BILITOT 0.2 12/13/2020   Lab Results  Component Value Date   HGBA1C 4.8 12/13/2020   HGBA1C 5.2 07/30/2020   HGBA1C 5.0 03/11/2020   HGBA1C 4.9 09/01/2019   HGBA1C 5.2 05/11/2018   Lab Results  Component Value Date   INSULIN 21.0 07/30/2020   INSULIN 17.3 03/11/2020   INSULIN 13.1 09/01/2019   INSULIN 16.7 05/11/2018   INSULIN 23.4 12/21/2017   Lab Results  Component Value Date   TSH 2.310 12/13/2020   Lab Results  Component Value Date   CHOL 174 12/13/2020   HDL 47 12/13/2020   LDLCALC 110 (H) 12/13/2020   TRIG 94 12/13/2020   CHOLHDL 3.7 12/13/2020   Lab Results  Component Value Date   WBC 9.5 12/13/2020   HGB 10.2 (L) 12/13/2020   HCT 34.0 12/13/2020   MCV 68 (L) 12/13/2020   PLT 471 (H) 12/13/2020   Lab Results  Component Value Date   IRON 25 (L) 09/01/2019   TIBC 367 09/01/2019   FERRITIN 13 (L) 09/01/2019   Attestation Statements:   Reviewed by clinician on day of visit: allergies, medications, problem list, medical history, surgical history, family  history, social history, and previous encounter notes.   I, Trixie Dredge, am acting as transcriptionist for Dennard Nip, MD.  I have reviewed the above documentation for accuracy and completeness, and I agree with the above. -  Dennard Nip, MD

## 2021-04-10 ENCOUNTER — Telehealth (INDEPENDENT_AMBULATORY_CARE_PROVIDER_SITE_OTHER): Payer: No Typology Code available for payment source | Admitting: Family Medicine

## 2021-04-10 ENCOUNTER — Encounter (INDEPENDENT_AMBULATORY_CARE_PROVIDER_SITE_OTHER): Payer: Self-pay | Admitting: Family Medicine

## 2021-04-10 ENCOUNTER — Other Ambulatory Visit: Payer: Self-pay

## 2021-04-10 DIAGNOSIS — R7303 Prediabetes: Secondary | ICD-10-CM

## 2021-04-10 DIAGNOSIS — Z6841 Body Mass Index (BMI) 40.0 and over, adult: Secondary | ICD-10-CM

## 2021-04-10 DIAGNOSIS — E559 Vitamin D deficiency, unspecified: Secondary | ICD-10-CM

## 2021-04-10 DIAGNOSIS — E66813 Obesity, class 3: Secondary | ICD-10-CM

## 2021-04-10 DIAGNOSIS — F3289 Other specified depressive episodes: Secondary | ICD-10-CM

## 2021-04-10 DIAGNOSIS — D508 Other iron deficiency anemias: Secondary | ICD-10-CM

## 2021-04-10 MED ORDER — METFORMIN HCL 500 MG PO TABS: 500.0000 mg | ORAL_TABLET | Freq: Three times a day (TID) | ORAL | 0 refills | Status: DC

## 2021-04-10 MED ORDER — BUPROPION HCL ER (SR) 200 MG PO TB12: 200.0000 mg | ORAL_TABLET | Freq: Two times a day (BID) | ORAL | 0 refills | Status: DC

## 2021-04-10 MED ORDER — TOPIRAMATE 100 MG PO TABS: 100.0000 mg | ORAL_TABLET | Freq: Every day | ORAL | 0 refills | Status: DC

## 2021-04-10 MED ORDER — FERROUS SULFATE 325 (65 FE) MG PO TABS: 325.0000 mg | ORAL_TABLET | Freq: Every day | ORAL | 0 refills | Status: DC

## 2021-04-10 MED ORDER — VITAMIN D (ERGOCALCIFEROL) 1.25 MG (50000 UNIT) PO CAPS: 50000.0000 [IU] | ORAL_CAPSULE | ORAL | 0 refills | Status: DC

## 2021-04-16 NOTE — Progress Notes (Signed)
TeleHealth Visit:  Due to the COVID-19 pandemic, this visit was completed with telemedicine (audio/video) technology to reduce patient and provider exposure as well as to preserve personal protective equipment.   Heidi Robinson has verbally consented to this TeleHealth visit. The patient is located at home, the provider is located at the [facility] office. The participants in this visit include the listed provider and patient. The visit was conducted today via telephone as patient was unable to use video.   Chief Complaint: OBESITY [name] is here to discuss her progress with her obesity treatment plan along with follow-up of her obesity related diagnoses. Heidi Robinson is on keeping a food journal and adhering to recommended goals of 1200-1400 calories and 80+ grams of protein daily and states she is following her eating plan approximately 25% of the time. [name] states she is doing 0 minutes 0 times per week.  Today's visit was #: 46 Starting weight: 258 lbs Starting date: 12/21/2017  Interim History: Heidi Robinson is sick today with a GI bug. She changed to virtual visit. She feels she has maintained her weight, but she has a very high stress level due to working 2 jobs and having little time to meal plan and prep. Her sleep is suffering due to long work hours as well.  Subjective:   1. Other iron deficiency anemia Heidi Robinson is stable on iron supplementation, and she requests a refill today.  2. Pre-diabetes Heidi Robinson notes increased stress, which can worsen insulin resistance and encourage weight gain.  3. Vitamin D deficiency Heidi Robinson is stable on Vit D, and her last level was at goal, and she does have an high risk of over-replacement. She denies nausea, vomiting, or muscle weakness.  4. Other depression with emotional eating Heidi Robinson notes increased stress with working a second job, which is especially challenging. She is doing fine with her medications and sleeping ok when she has the time to get a full nights  sleep.  Assessment/Plan:   1. Other iron deficiency anemia We will refill FeSo4 for 1 month. Orders and follow up as documented in patient record.  - ferrous sulfate 325 (65 FE) MG tablet; Take 1 tablet (325 mg total) by mouth daily with breakfast.  Dispense: 30 tablet; Refill: 0  2. Pre-diabetes [name] will continue to work on weight loss, exercise, and decreasing simple carbohydrates to help decrease the risk of diabetes. We will refill metformin for 1 month.  - metFORMIN (GLUCOPHAGE) 500 MG tablet; Take 1 tablet (500 mg total) by mouth 3 (three) times daily.  Dispense: 90 tablet; Refill: 0  3. Vitamin D deficiency Low Vitamin D level contributes to fatigue and are associated with obesity, breast, and colon cancer. We will refill prescription Vitamin D for 1 month, and we will plan to recheck labs at her next in office visit. She will follow-up for routine testing of Vitamin D, at least 2-3 times per year to avoid over-replacement.  - Vitamin D, Ergocalciferol, (DRISDOL) 1.25 MG (50000 UNIT) CAPS capsule; Take 1 capsule (50,000 Units total) by mouth every 7 (seven) days.  Dispense: 4 capsule; Refill: 0  4. Other depression with emotional eating Behavior modification techniques were discussed today to help Heidi Robinson deal with her emotional/non-hunger eating behaviors. We will refill both Topamax and Wellbutrin SR for 1 month. Orders and follow up as documented in patient record.   - buPROPion (WELLBUTRIN SR) 200 MG 12 hr tablet; Take 1 tablet (200 mg total) by mouth 2 (two) times daily.  Dispense:  60 tablet; Refill: 0 - topiramate (TOPAMAX) 100 MG tablet; Take 1 tablet (100 mg total) by mouth at bedtime.  Dispense: 30 tablet; Refill: 0  5. Obesity with current BMI 41.52 Heidi Robinson is currently in the action stage of change. As such, her goal is to continue with weight loss efforts. She has agreed to keeping a food journal and adhering to recommended goals of 1200-1400 calories and 80+ grams of protein  daily.   Heidi Robinson is to work on hydrating until her gastroenteritis improves and will try to avoid stress induced weight gain. We will continue to follow closely.  Behavioral modification strategies: increasing lean protein intake and meal planning and cooking strategies.  Heidi Robinson has agreed to follow-up with our clinic in 3 to 4 weeks. She was informed of the importance of frequent follow-up visits to maximize her success with intensive lifestyle modifications for her multiple health conditions.  Objective:   VITALS: Per patient if applicable, see vitals. GENERAL: Alert and in no acute distress. CARDIOPULMONARY: No increased WOB. Speaking in clear sentences.  PSYCH: Pleasant and cooperative. Speech normal rate and rhythm. Affect is appropriate. Insight and judgement are appropriate. Attention is focused, linear, and appropriate.  NEURO: Oriented as arrived to appointment on time with no prompting.   Lab Results  Component Value Date   CREATININE 0.89 12/13/2020   BUN 11 12/13/2020   NA 138 12/13/2020   K 4.1 12/13/2020   CL 101 12/13/2020   CO2 24 12/13/2020   Lab Results  Component Value Date   ALT 11 12/13/2020   AST 14 12/13/2020   ALKPHOS 80 12/13/2020   BILITOT 0.2 12/13/2020   Lab Results  Component Value Date   HGBA1C 4.8 12/13/2020   HGBA1C 5.2 07/30/2020   HGBA1C 5.0 03/11/2020   HGBA1C 4.9 09/01/2019   HGBA1C 5.2 05/11/2018   Lab Results  Component Value Date   INSULIN 21.0 07/30/2020   INSULIN 17.3 03/11/2020   INSULIN 13.1 09/01/2019   INSULIN 16.7 05/11/2018   INSULIN 23.4 12/21/2017   Lab Results  Component Value Date   TSH 2.310 12/13/2020   Lab Results  Component Value Date   CHOL 174 12/13/2020   HDL 47 12/13/2020   LDLCALC 110 (H) 12/13/2020   TRIG 94 12/13/2020   CHOLHDL 3.7 12/13/2020   Lab Results  Component Value Date   WBC 9.5 12/13/2020   HGB 10.2 (L) 12/13/2020   HCT 34.0 12/13/2020   MCV 68 (L) 12/13/2020   PLT 471 (H) 12/13/2020    Lab Results  Component Value Date   IRON 25 (L) 09/01/2019   TIBC 367 09/01/2019   FERRITIN 13 (L) 09/01/2019    Attestation Statements:   Reviewed by clinician on day of visit: allergies, medications, problem list, medical history, surgical history, family history, social history, and previous encounter notes.   I, Trixie Dredge, am acting as transcriptionist for Dennard Nip, MD.  I have reviewed the above documentation for accuracy and completeness, and I agree with the above. - Dennard Nip, MD

## 2021-04-21 ENCOUNTER — Other Ambulatory Visit: Payer: Self-pay | Admitting: Orthopedic Surgery

## 2021-04-21 DIAGNOSIS — M25511 Pain in right shoulder: Secondary | ICD-10-CM

## 2021-04-24 ENCOUNTER — Other Ambulatory Visit: Payer: Self-pay

## 2021-04-24 ENCOUNTER — Ambulatory Visit
Admission: RE | Admit: 2021-04-24 | Discharge: 2021-04-24 | Disposition: A | Discharge status: Home / Self Care | Payer: No Typology Code available for payment source | Source: Ambulatory Visit | Attending: Orthopedic Surgery | Admitting: Orthopedic Surgery

## 2021-04-24 DIAGNOSIS — M25511 Pain in right shoulder: Secondary | ICD-10-CM

## 2021-05-15 ENCOUNTER — Encounter (INDEPENDENT_AMBULATORY_CARE_PROVIDER_SITE_OTHER): Payer: Self-pay | Admitting: Family Medicine

## 2021-05-15 ENCOUNTER — Other Ambulatory Visit: Payer: Self-pay

## 2021-05-15 ENCOUNTER — Ambulatory Visit (INDEPENDENT_AMBULATORY_CARE_PROVIDER_SITE_OTHER): Payer: No Typology Code available for payment source | Admitting: Family Medicine

## 2021-05-15 VITALS — BP 123/76 | HR 89 | Temp 98.0°F | Ht 62.0 in | Wt 230.0 lb

## 2021-05-15 DIAGNOSIS — E559 Vitamin D deficiency, unspecified: Secondary | ICD-10-CM

## 2021-05-15 DIAGNOSIS — F3289 Other specified depressive episodes: Secondary | ICD-10-CM

## 2021-05-15 DIAGNOSIS — D508 Other iron deficiency anemias: Secondary | ICD-10-CM | POA: Diagnosis not present

## 2021-05-15 DIAGNOSIS — K5909 Other constipation: Secondary | ICD-10-CM

## 2021-05-15 DIAGNOSIS — R7303 Prediabetes: Secondary | ICD-10-CM

## 2021-05-15 DIAGNOSIS — Z6841 Body Mass Index (BMI) 40.0 and over, adult: Secondary | ICD-10-CM

## 2021-05-15 DIAGNOSIS — Z9189 Other specified personal risk factors, not elsewhere classified: Secondary | ICD-10-CM | POA: Diagnosis not present

## 2021-05-15 MED ORDER — METFORMIN HCL 500 MG PO TABS: 500.0000 mg | ORAL_TABLET | Freq: Three times a day (TID) | ORAL | 0 refills | Status: DC

## 2021-05-15 MED ORDER — POLYETHYLENE GLYCOL 3350 17 GM/SCOOP PO POWD: 17.0000 g | Freq: Every day | ORAL | 0 refills | Status: DC

## 2021-05-15 MED ORDER — TOPIRAMATE 100 MG PO TABS: 100.0000 mg | ORAL_TABLET | Freq: Every day | ORAL | 0 refills | Status: DC

## 2021-05-15 MED ORDER — BUPROPION HCL ER (SR) 200 MG PO TB12: 200.0000 mg | ORAL_TABLET | Freq: Two times a day (BID) | ORAL | 0 refills | Status: DC

## 2021-05-15 MED ORDER — FERROUS SULFATE 325 (65 FE) MG PO TABS: 325.0000 mg | ORAL_TABLET | Freq: Every day | ORAL | 0 refills | Status: DC

## 2021-05-22 NOTE — Progress Notes (Signed)
Chief Complaint:   OBESITY Heidi Robinson is here to discuss her progress with her obesity treatment plan along with follow-up of her obesity related diagnoses. Heidi Robinson is on keeping a food journal and adhering to recommended goals of 1200-1400 calories and 80+ grams of protein daily and states she is following her eating plan approximately 0% of the time. Heidi Robinson states she is doing 0 minutes 0 times per week.  Today's visit was #: 15 Starting weight: 258 lbs Starting date: 12/21/2017 Today's weight: 230 lbs Today's date: 05/15/2021 Total lbs lost to date: 28 Total lbs lost since last in-office visit: 0  Interim History: Heidi Robinson's life has been hectic recently and she has not had the time to meal plan or prep or journal. She is doing physical therapy nowas well as working 2 jobs and she is a bit overwhelmed at this times.  Subjective:   1. Other iron deficiency anemia Heidi Robinson has been taking iron regularly, and she still notes fatigue but this is likely due to stress and decreased sleep.  2. Pre-diabetes Heidi Robinson is stable on metformin but she is struggling more with her eating plan.  3. Vitamin D deficiency Heidi Robinson's last Vit D was at goal. She is due for labs as she is at high risk of over-replacement. She cannot do labs today, but she is willing to do them next month.  4. Other constipation Heidi Robinson notes constipation  while on iron supplement but this improves on miralax. She denies abdominal pain.  5. Other depression with emotional eating Heidi Robinson has increased stress with working 2 jobs and having a family. She is working on decreasing emotional eating behaviors,  6. At risk for impaired metabolic function Heidi Robinson is at increased risk for impaired metabolic function if protein decreases.  Assessment/Plan:   1. Other iron deficiency anemia We will refill FeSo4 for 1 month, and we will recheck labs in 1 month. Heidi Robinson will continue to follow up as directed. Orders and follow up as documented in patient  record.  - ferrous sulfate 325 (65 FE) MG tablet; Take 1 tablet (325 mg total) by mouth daily with breakfast.  Dispense: 30 tablet; Refill: 0  2. Pre-diabetes Heidi Robinson will continue to work on weight loss, exercise, and decreasing simple carbohydrates to help decrease the risk of diabetes. We will refill metformin for 1 month, and we will refill labs in 1 month.  - metFORMIN (GLUCOPHAGE) 500 MG tablet; Take 1 tablet (500 mg total) by mouth 3 (three) times daily.  Dispense: 90 tablet; Refill: 0  3. Vitamin D deficiency Low Vitamin D level contributes to fatigue and are associated with obesity, breast, and colon cancer. Heidi Robinson agreed to discontinue prescription Vitamin D. We will recheck labs in 1 month and she will follow-up for routine testing of Vitamin D, at least 2-3 times per year to avoid over-replacement.  4. Other constipation Heidi Robinson was informed that a decrease in bowel movement frequency is normal while losing weight, but stools should not be hard or painful. We will refill miralax for 1 month. Orders and follow up as documented in patient record.   - polyethylene glycol powder (GLYCOLAX/MIRALAX) 17 GM/SCOOP powder; Take 17 g by mouth daily.  Dispense: 3350 g; Refill: 0  5. Other depression with emotional eating Behavior modification techniques were discussed today to help Heidi Robinson deal with her emotional/non-hunger eating behaviors. We will refill Wellbutrin SR and Topamax for 1 month. Orders and follow up as documented in patient record.   - topiramate (TOPAMAX)  100 MG tablet; Take 1 tablet (100 mg total) by mouth at bedtime.  Dispense: 30 tablet; Refill: 0 - buPROPion (WELLBUTRIN SR) 200 MG 12 hr tablet; Take 1 tablet (200 mg total) by mouth 2 (two) times daily.  Dispense: 60 tablet; Refill: 0  6. At risk for impaired metabolic function Heidi Robinson was given approximately 15 minutes of impaired  metabolic function prevention counseling today. We discussed intensive lifestyle modifications today with  an emphasis on specific nutrition and exercise instructions and strategies.   Repetitive spaced learning was employed today to elicit superior memory formation and behavioral change.  7. Obesity with current BMI 42.1 Heidi Robinson is currently in the action stage of change. As such, her goal is to maintain weight for now. She has agreed to keeping a food journal and adhering to recommended goals of 1200-1400 calories and 80+ grams of protein daily.   Heidi Robinson's goal is to try to increase protein and maintain her weight until she is able to concentrate on weight loss again.  Behavioral modification strategies: increasing lean protein intake and meal planning and cooking strategies.  Heidi Robinson has agreed to follow-up with our clinic in 4 weeks. She was informed of the importance of frequent follow-up visits to maximize her success with intensive lifestyle modifications for her multiple health conditions.   Objective:   Blood pressure 123/76, pulse 89, temperature 98 F (36.7 C), height 5\' 2"  (1.575 m), weight 230 lb (104.3 kg), SpO2 100 %. Body mass index is 42.07 kg/m.  General: Cooperative, alert, well developed, in no acute distress. HEENT: Conjunctivae and lids unremarkable. Cardiovascular: Regular rhythm.  Lungs: Normal work of breathing. Neurologic: No focal deficits.   Lab Results  Component Value Date   CREATININE 0.89 12/13/2020   BUN 11 12/13/2020   NA 138 12/13/2020   K 4.1 12/13/2020   CL 101 12/13/2020   CO2 24 12/13/2020   Lab Results  Component Value Date   ALT 11 12/13/2020   AST 14 12/13/2020   ALKPHOS 80 12/13/2020   BILITOT 0.2 12/13/2020   Lab Results  Component Value Date   HGBA1C 4.8 12/13/2020   HGBA1C 5.2 07/30/2020   HGBA1C 5.0 03/11/2020   HGBA1C 4.9 09/01/2019   HGBA1C 5.2 05/11/2018   Lab Results  Component Value Date   INSULIN 21.0 07/30/2020   INSULIN 17.3 03/11/2020   INSULIN 13.1 09/01/2019   INSULIN 16.7 05/11/2018   INSULIN 23.4 12/21/2017   Lab  Results  Component Value Date   TSH 2.310 12/13/2020   Lab Results  Component Value Date   CHOL 174 12/13/2020   HDL 47 12/13/2020   LDLCALC 110 (H) 12/13/2020   TRIG 94 12/13/2020   CHOLHDL 3.7 12/13/2020   Lab Results  Component Value Date   VD25OH 78.7 01/13/2021   VD25OH 68.9 07/30/2020   VD25OH 63.3 03/11/2020   Lab Results  Component Value Date   WBC 9.5 12/13/2020   HGB 10.2 (L) 12/13/2020   HCT 34.0 12/13/2020   MCV 68 (L) 12/13/2020   PLT 471 (H) 12/13/2020   Lab Results  Component Value Date   IRON 25 (L) 09/01/2019   TIBC 367 09/01/2019   FERRITIN 13 (L) 09/01/2019   Attestation Statements:   Reviewed by clinician on day of visit: allergies, medications, problem list, medical history, surgical history, family history, social history, and previous encounter notes.   I, Trixie Dredge, am acting as transcriptionist for Dennard Nip, MD.  I have reviewed the above documentation for accuracy  and completeness, and I agree with the above. -  Dennard Nip, MD

## 2021-06-11 ENCOUNTER — Encounter (INDEPENDENT_AMBULATORY_CARE_PROVIDER_SITE_OTHER): Payer: Self-pay | Admitting: Family Medicine

## 2021-06-11 ENCOUNTER — Other Ambulatory Visit: Payer: Self-pay

## 2021-06-11 ENCOUNTER — Ambulatory Visit (INDEPENDENT_AMBULATORY_CARE_PROVIDER_SITE_OTHER): Payer: No Typology Code available for payment source | Admitting: Family Medicine

## 2021-06-11 VITALS — BP 111/78 | HR 80 | Temp 98.1°F | Ht 62.0 in | Wt 226.0 lb

## 2021-06-11 DIAGNOSIS — I10 Essential (primary) hypertension: Secondary | ICD-10-CM

## 2021-06-11 DIAGNOSIS — D508 Other iron deficiency anemias: Secondary | ICD-10-CM | POA: Diagnosis not present

## 2021-06-11 DIAGNOSIS — F3289 Other specified depressive episodes: Secondary | ICD-10-CM | POA: Diagnosis not present

## 2021-06-11 DIAGNOSIS — R7303 Prediabetes: Secondary | ICD-10-CM | POA: Diagnosis not present

## 2021-06-11 DIAGNOSIS — Z6841 Body Mass Index (BMI) 40.0 and over, adult: Secondary | ICD-10-CM

## 2021-06-11 DIAGNOSIS — Z9189 Other specified personal risk factors, not elsewhere classified: Secondary | ICD-10-CM

## 2021-06-11 MED ORDER — FERROUS SULFATE 325 (65 FE) MG PO TABS: 325.0000 mg | ORAL_TABLET | Freq: Every day | ORAL | 0 refills | Status: DC

## 2021-06-11 MED ORDER — BUPROPION HCL ER (SR) 200 MG PO TB12: 200.0000 mg | ORAL_TABLET | Freq: Two times a day (BID) | ORAL | 0 refills | Status: DC

## 2021-06-11 MED ORDER — TOPIRAMATE 100 MG PO TABS: 100.0000 mg | ORAL_TABLET | Freq: Every day | ORAL | 0 refills | Status: DC

## 2021-06-11 MED ORDER — LISINOPRIL-HYDROCHLOROTHIAZIDE 10-12.5 MG PO TABS: 1.0000 | ORAL_TABLET | Freq: Every day | ORAL | 1 refills | Status: DC

## 2021-06-11 MED ORDER — METFORMIN HCL 500 MG PO TABS: 500.0000 mg | ORAL_TABLET | Freq: Three times a day (TID) | ORAL | 0 refills | Status: DC

## 2021-06-11 NOTE — Patient Instructions (Signed)
Health Maintenance Due  Topic Date Due   Pneumococcal Vaccine 57-45 Years old (2 - PCV) 05/19/2014   COVID-19 Vaccine (3 - Booster for Pfizer series) 08/19/2020   PAP SMEAR-Modifier  02/25/2021   INFLUENZA VACCINE  06/09/2021    Depression screen PHQ 2/9 12/13/2020 04/17/2020 10/23/2019  Decreased Interest 0 0 0  Down, Depressed, Hopeless 1 0 0  PHQ - 2 Score 1 0 0  Altered sleeping - - -  Tired, decreased energy - - -  Change in appetite - - -  Feeling bad or failure about yourself  - - -  Trouble concentrating - - -  Moving slowly or fidgety/restless - - -  Suicidal thoughts - - -  PHQ-9 Score - - -  Difficult doing work/chores - - -

## 2021-06-12 LAB — CMP14+EGFR
ALT: 9 IU/L (ref 0–32)
AST: 11 IU/L (ref 0–40)
Albumin/Globulin Ratio: 2.1 (ref 1.2–2.2)
Albumin: 4.4 g/dL (ref 3.8–4.8)
Alkaline Phosphatase: 72 IU/L (ref 44–121)
BUN/Creatinine Ratio: 13 (ref 9–23)
BUN: 12 mg/dL (ref 6–24)
Bilirubin Total: <0.2 mg/dL (ref 0.0–1.2)
CO2: 23 mmol/L (ref 20–29)
Calcium: 9.2 mg/dL (ref 8.7–10.2)
Chloride: 102 mmol/L (ref 96–106)
Creatinine, Ser: 0.93 mg/dL (ref 0.57–1.00)
Globulin, Total: 2.1 g/dL (ref 1.5–4.5)
Glucose: 89 mg/dL (ref 65–99)
Potassium: 3.9 mmol/L (ref 3.5–5.2)
Sodium: 140 mmol/L (ref 134–144)
Total Protein: 6.5 g/dL (ref 6.0–8.5)
eGFR: 78 mL/min/{1.73_m2} (ref >59)

## 2021-06-12 LAB — CBC WITH DIFFERENTIAL/PLATELET
Basophils Absolute: 0 10*3/uL (ref 0.0–0.2)
Basos: 0 %
EOS (ABSOLUTE): 0.3 10*3/uL (ref 0.0–0.4)
Eos: 3 %
Hematocrit: 33.3 % — ABNORMAL LOW (ref 34.0–46.6)
Hemoglobin: 10.7 g/dL — ABNORMAL LOW (ref 11.1–15.9)
Immature Grans (Abs): 0.1 10*3/uL (ref 0.0–0.1)
Immature Granulocytes: 1 %
Lymphocytes Absolute: 1.8 10*3/uL (ref 0.7–3.1)
Lymphs: 18 %
MCH: 22.7 pg — ABNORMAL LOW (ref 26.6–33.0)
MCHC: 32.1 g/dL (ref 31.5–35.7)
MCV: 71 fL — ABNORMAL LOW (ref 79–97)
Monocytes Absolute: 0.6 10*3/uL (ref 0.1–0.9)
Monocytes: 6 %
Neutrophils Absolute: 7.5 10*3/uL — ABNORMAL HIGH (ref 1.4–7.0)
Neutrophils: 72 %
Platelets: 414 10*3/uL (ref 150–450)
RBC: 4.71 x10E6/uL (ref 3.77–5.28)
RDW: 14.8 % (ref 11.7–15.4)
WBC: 10.2 10*3/uL (ref 3.4–10.8)

## 2021-06-12 LAB — VITAMIN D 25 HYDROXY (VIT D DEFICIENCY, FRACTURES): Vit D, 25-Hydroxy: 51.4 ng/mL (ref 30.0–100.0)

## 2021-06-12 LAB — LIPID PANEL WITH LDL/HDL RATIO
Cholesterol, Total: 189 mg/dL (ref 100–199)
HDL: 45 mg/dL (ref >39)
LDL Chol Calc (NIH): 125 mg/dL — ABNORMAL HIGH (ref 0–99)
LDL/HDL Ratio: 2.8 ratio (ref 0.0–3.2)
Triglycerides: 102 mg/dL (ref 0–149)
VLDL Cholesterol Cal: 19 mg/dL (ref 5–40)

## 2021-06-12 LAB — HEMOGLOBIN A1C
Est. average glucose Bld gHb Est-mCnc: 94 mg/dL
Hgb A1c MFr Bld: 4.9 % (ref 4.8–5.6)

## 2021-06-12 LAB — INSULIN, RANDOM: INSULIN: 18.9 u[IU]/mL (ref 2.6–24.9)

## 2021-06-16 NOTE — Progress Notes (Signed)
Chief Complaint:   OBESITY Heidi Robinson is here to discuss her progress with her obesity treatment plan along with follow-up of her obesity related diagnoses. Heidi Robinson is on keeping a food journal and adhering to recommended goals of 1200-1400 calories and 80+ grams of protein daily and states she is following her eating plan approximately 0% of the time. Heidi Robinson states she is doing 0 minutes 0 times per week.  Today's visit was #: 52 Starting weight: 258 lbs Starting date: 12/21/2017 Today's weight: 226 lbs Today's date: 06/11/2021 Total lbs lost to date: 32 Total lbs lost since last in-office visit: 4  Interim History: Heidi Robinson continues to do well with weight loss. She isn't able to journal, but she is trying to portion control and make smarter choices and she has decreased snacking. She is active at her second job but no formal exercise. She is skipping meals which can decrease her RMR.  Subjective:   1. Other iron deficiency anemia Heidi Robinson is stable on Feso4, and miralax is helping to prevent constipation.  2. Pre-diabetes Heidi Robinson is working on diet and she is stable on metformin. She denies nausea or vomiting.  3. Essential hypertension Heidi Robinson's blood pressure is at goal with diet, weight loss, and her medications.  4. Other depression with emotional eating Heidi Robinson's mood is good. She is stable on her medications and she is doing well with minimizing emotional eating behaviors.  5. At risk for impaired metabolic function Heidi Robinson is at increased risk for impaired metabolic function due to current nutrition and muscle mass.  Assessment/Plan:   1. Other iron deficiency anemia We will check labs today. We will refill FeSo4 for 1 month, and Synda will continue miralax. Orders and follow up as documented in patient record.  - ferrous sulfate 325 (65 FE) MG tablet; Take 1 tablet (325 mg total) by mouth daily with breakfast.  Dispense: 30 tablet; Refill: 0 - VITAMIN D 25 Hydroxy (Vit-D Deficiency,  Fractures) - CBC with Differential/Platelet  2. Pre-diabetes Heidi Robinson will continue to work on weight loss, exercise, and decreasing simple carbohydrates to help decrease the risk of diabetes. We will check labs today and we will refill metformin for 1 month.  - metFORMIN (GLUCOPHAGE) 500 MG tablet; Take 1 tablet (500 mg total) by mouth 3 (three) times daily.  Dispense: 90 tablet; Refill: 0 - CMP14+EGFR - Insulin, random - Hemoglobin A1c  3. Essential hypertension Heidi Robinson will continue working on healthy weight loss and exercise to improve blood pressure control. We will check labs today, and we will refill lisinopril-hydrochlorothiazide for 90 days with 1 refill. We watch for signs of hypotension as she continues her lifestyle modifications.  - lisinopril-hydrochlorothiazide (ZESTORETIC) 10-12.5 MG tablet; Take 1 tablet by mouth daily. Take 1 tablet by mouth daily.  Dispense: 90 tablet; Refill: 1 - Lipid Panel With LDL/HDL Ratio  4. Other depression with emotional eating Behavior modification techniques were discussed today to help Heidi Robinson deal with her emotional/non-hunger eating behaviors. We will refill Wellbutrin SR for 1 month, and we will refill Topamax for 1 month. Orders and follow up as documented in patient record.   - buPROPion (WELLBUTRIN SR) 200 MG 12 hr tablet; Take 1 tablet (200 mg total) by mouth 2 (two) times daily.  Dispense: 60 tablet; Refill: 0 - topiramate (TOPAMAX) 100 MG tablet; Take 1 tablet (100 mg total) by mouth at bedtime.  Dispense: 30 tablet; Refill: 0  5. At risk for impaired metabolic function Heidi Robinson was given approximately 15 minutes  of impaired  metabolic function prevention counseling today. We discussed intensive lifestyle modifications today with an emphasis on specific nutrition and exercise instructions and strategies.   Repetitive spaced learning was employed today to elicit superior memory formation and behavioral change.  6. Obesity with current BMI  41.4 Heidi Robinson is currently in the action stage of change. As such, her goal is to continue with weight loss efforts. She has agreed to keeping a food journal and adhering to recommended goals of 1200-1400 calories and 80+ grams of protein daily.   Behavioral modification strategies: increasing lean protein intake and meal planning and cooking strategies.  Heidi Robinson has agreed to follow-up with our clinic in 2 to 3 weeks. She was informed of the importance of frequent follow-up visits to maximize her success with intensive lifestyle modifications for her multiple health conditions.   Heidi Robinson was informed we would discuss her lab results at her next visit unless there is a critical issue that needs to be addressed sooner. Heidi Robinson agreed to keep her next visit at the agreed upon time to discuss these results.  Objective:   Blood pressure 111/78, pulse 80, temperature 98.1 F (36.7 C), height 5' 2" (1.575 m), weight 226 lb (102.5 kg), SpO2 98 %. Body mass index is 41.34 kg/m.  General: Cooperative, alert, well developed, in no acute distress. HEENT: Conjunctivae and lids unremarkable. Cardiovascular: Regular rhythm.  Lungs: Normal work of breathing. Neurologic: No focal deficits.   Lab Results  Component Value Date   CREATININE 0.93 06/11/2021   BUN 12 06/11/2021   NA 140 06/11/2021   K 3.9 06/11/2021   CL 102 06/11/2021   CO2 23 06/11/2021   Lab Results  Component Value Date   ALT 9 06/11/2021   AST 11 06/11/2021   ALKPHOS 72 06/11/2021   BILITOT <0.2 06/11/2021   Lab Results  Component Value Date   HGBA1C 4.9 06/11/2021   HGBA1C 4.8 12/13/2020   HGBA1C 5.2 07/30/2020   HGBA1C 5.0 03/11/2020   HGBA1C 4.9 09/01/2019   Lab Results  Component Value Date   INSULIN 18.9 06/11/2021   INSULIN 21.0 07/30/2020   INSULIN 17.3 03/11/2020   INSULIN 13.1 09/01/2019   INSULIN 16.7 05/11/2018   Lab Results  Component Value Date   TSH 2.310 12/13/2020   Lab Results  Component Value Date    CHOL 189 06/11/2021   HDL 45 06/11/2021   LDLCALC 125 (H) 06/11/2021   TRIG 102 06/11/2021   CHOLHDL 3.7 12/13/2020   Lab Results  Component Value Date   VD25OH 51.4 06/11/2021   VD25OH 78.7 01/13/2021   VD25OH 68.9 07/30/2020   Lab Results  Component Value Date   WBC 10.2 06/11/2021   HGB 10.7 (L) 06/11/2021   HCT 33.3 (L) 06/11/2021   MCV 71 (L) 06/11/2021   PLT 414 06/11/2021   Lab Results  Component Value Date   IRON 25 (L) 09/01/2019   TIBC 367 09/01/2019   FERRITIN 13 (L) 09/01/2019   Attestation Statements:   Reviewed by clinician on day of visit: allergies, medications, problem list, medical history, surgical history, family history, social history, and previous encounter notes.   I, Trixie Dredge, am acting as transcriptionist for Dennard Nip, MD.  I have reviewed the above documentation for accuracy and completeness, and I agree with the above. -  Dennard Nip, MD

## 2021-07-17 ENCOUNTER — Ambulatory Visit (INDEPENDENT_AMBULATORY_CARE_PROVIDER_SITE_OTHER): Payer: No Typology Code available for payment source | Admitting: Family Medicine

## 2021-07-17 ENCOUNTER — Other Ambulatory Visit: Payer: Self-pay

## 2021-07-17 ENCOUNTER — Encounter (INDEPENDENT_AMBULATORY_CARE_PROVIDER_SITE_OTHER): Payer: Self-pay | Admitting: Family Medicine

## 2021-07-17 VITALS — BP 136/76 | HR 85 | Temp 98.4°F | Ht 62.0 in | Wt 231.0 lb

## 2021-07-17 DIAGNOSIS — I10 Essential (primary) hypertension: Secondary | ICD-10-CM | POA: Diagnosis not present

## 2021-07-17 DIAGNOSIS — R7303 Prediabetes: Secondary | ICD-10-CM | POA: Diagnosis not present

## 2021-07-17 DIAGNOSIS — Z9189 Other specified personal risk factors, not elsewhere classified: Secondary | ICD-10-CM

## 2021-07-17 DIAGNOSIS — F3289 Other specified depressive episodes: Secondary | ICD-10-CM | POA: Diagnosis not present

## 2021-07-17 DIAGNOSIS — Z6841 Body Mass Index (BMI) 40.0 and over, adult: Secondary | ICD-10-CM

## 2021-07-17 MED ORDER — LISINOPRIL-HYDROCHLOROTHIAZIDE 10-12.5 MG PO TABS: 1.0000 | ORAL_TABLET | Freq: Every day | ORAL | 0 refills | Status: DC

## 2021-07-17 MED ORDER — METFORMIN HCL 500 MG PO TABS: 500.0000 mg | ORAL_TABLET | Freq: Three times a day (TID) | ORAL | 0 refills | Status: DC

## 2021-07-17 MED ORDER — BUPROPION HCL ER (SR) 200 MG PO TB12: 200.0000 mg | ORAL_TABLET | Freq: Two times a day (BID) | ORAL | 0 refills | Status: DC

## 2021-07-17 MED ORDER — TOPIRAMATE 100 MG PO TABS: 100.0000 mg | ORAL_TABLET | Freq: Every day | ORAL | 0 refills | Status: DC

## 2021-07-17 NOTE — Progress Notes (Signed)
Chief Complaint:   OBESITY Heidi Robinson is here to discuss her progress with her obesity treatment plan along with follow-up of her obesity related diagnoses. Heidi Robinson is on keeping a food journal and adhering to recommended goals of 1200-1400 calories and 80+ grams of protein daily and states she is following her eating plan approximately 0% of the time. Heidi Robinson states she is doing free weights and walking for 30 minutes 3 times per week.  Today's visit was #: 38 Starting weight: 258 lbs Starting date: 12/21/2017 Today's weight: 231 lbs Today's date: 07/17/2021 Total lbs lost to date: 27 Total lbs lost since last in-office visit: 0  Interim History: Heidi Robinson has been on vacation and she did some celebration eating. She was much more active during this time. Her husband had an health scar and he is ready to start eating healthy with her.  Subjective:   1. Pre-diabetes Heidi Robinson is stable on metformin, and she denies nausea or vomiting. She is working on getting back to her eating plan.  2. Essential hypertension Heidi Robinson's blood pressure is stable on her medications. She is working on decreasing sodium in her diet.  3. Other depression with emotional eating Heidi Robinson is stable on her medications. Her blood pressure is stable with no side effects noted.  4. At risk for heart disease Heidi Robinson is at a higher than average risk for cardiovascular disease due to obesity.   Assessment/Plan:   1. Pre-diabetes Heidi Robinson will continue to work on weight loss, exercise, and decreasing simple carbohydrates to help decrease the risk of diabetes. We will refill metformin for 1 month.  - metFORMIN (GLUCOPHAGE) 500 MG tablet; Take 1 tablet (500 mg total) by mouth 3 (three) times daily.  Dispense: 90 tablet; Refill: 0  2. Essential hypertension Heidi Robinson will continue working on healthy weight loss and exercise to improve blood pressure control. We will refill lisinopril-hydrochlorothiazide for 1 month. She will watch for signs of  hypotension as she continues her lifestyle modifications.  - lisinopril-hydrochlorothiazide (ZESTORETIC) 10-12.5 MG tablet; Take 1 tablet by mouth daily. Take 1 tablet by mouth daily.  Dispense: 30 tablet; Refill: 0  3. Other depression with emotional eating Behavior modification techniques were discussed today to help Heidi Robinson deal with her emotional/non-hunger eating behaviors. We will refill both Wellbutrin SR and Topamax for 1 month. Orders and follow up as documented in patient record.   - buPROPion (WELLBUTRIN SR) 200 MG 12 hr tablet; Take 1 tablet (200 mg total) by mouth 2 (two) times daily.  Dispense: 60 tablet; Refill: 0 - topiramate (TOPAMAX) 100 MG tablet; Take 1 tablet (100 mg total) by mouth at bedtime.  Dispense: 30 tablet; Refill: 0  4. At risk for heart disease Heidi Robinson was given approximately 15 minutes of coronary artery disease prevention counseling today. She is 45 y.o. female and has risk factors for heart disease including obesity. We discussed intensive lifestyle modifications today with an emphasis on specific weight loss instructions and strategies.   Repetitive spaced learning was employed today to elicit superior memory formation and behavioral change.  5. Obesity with current BMI 42.4 Heidi Robinson is currently in the action stage of change. As such, her goal is to continue with weight loss efforts. She has agreed to the Category 2 Plan with breakfast options.   Exercise goals: As is.  Behavioral modification strategies: increasing lean protein intake, decreasing simple carbohydrates, meal planning and cooking strategies, and travel eating strategies.  Heidi Robinson has agreed to follow-up with our clinic in 3  weeks. She was informed of the importance of frequent follow-up visits to maximize her success with intensive lifestyle modifications for her multiple health conditions.   Objective:   Blood pressure 136/76, pulse 85, temperature 98.4 F (36.9 C), height '5\' 2"'$  (1.575 m), weight 231  lb (104.8 kg), SpO2 100 %. Body mass index is 42.25 kg/m.  General: Cooperative, alert, well developed, in no acute distress. HEENT: Conjunctivae and lids unremarkable. Cardiovascular: Regular rhythm.  Lungs: Normal work of breathing. Neurologic: No focal deficits.   Lab Results  Component Value Date   CREATININE 0.93 06/11/2021   BUN 12 06/11/2021   NA 140 06/11/2021   K 3.9 06/11/2021   CL 102 06/11/2021   CO2 23 06/11/2021   Lab Results  Component Value Date   ALT 9 06/11/2021   AST 11 06/11/2021   ALKPHOS 72 06/11/2021   BILITOT <0.2 06/11/2021   Lab Results  Component Value Date   HGBA1C 4.9 06/11/2021   HGBA1C 4.8 12/13/2020   HGBA1C 5.2 07/30/2020   HGBA1C 5.0 03/11/2020   HGBA1C 4.9 09/01/2019   Lab Results  Component Value Date   INSULIN 18.9 06/11/2021   INSULIN 21.0 07/30/2020   INSULIN 17.3 03/11/2020   INSULIN 13.1 09/01/2019   INSULIN 16.7 05/11/2018   Lab Results  Component Value Date   TSH 2.310 12/13/2020   Lab Results  Component Value Date   CHOL 189 06/11/2021   HDL 45 06/11/2021   LDLCALC 125 (H) 06/11/2021   TRIG 102 06/11/2021   CHOLHDL 3.7 12/13/2020   Lab Results  Component Value Date   VD25OH 51.4 06/11/2021   VD25OH 78.7 01/13/2021   VD25OH 68.9 07/30/2020   Lab Results  Component Value Date   WBC 10.2 06/11/2021   HGB 10.7 (L) 06/11/2021   HCT 33.3 (L) 06/11/2021   MCV 71 (L) 06/11/2021   PLT 414 06/11/2021   Lab Results  Component Value Date   IRON 25 (L) 09/01/2019   TIBC 367 09/01/2019   FERRITIN 13 (L) 09/01/2019   Attestation Statements:   Reviewed by clinician on day of visit: allergies, medications, problem list, medical history, surgical history, family history, social history, and previous encounter notes.   I, Trixie Dredge, am acting as transcriptionist for Dennard Nip, MD.  I have reviewed the above documentation for accuracy and completeness, and I agree with the above. -  Dennard Nip,  MD

## 2021-08-13 ENCOUNTER — Encounter (INDEPENDENT_AMBULATORY_CARE_PROVIDER_SITE_OTHER): Payer: Self-pay | Admitting: Family Medicine

## 2021-08-13 ENCOUNTER — Other Ambulatory Visit: Payer: Self-pay

## 2021-08-13 ENCOUNTER — Ambulatory Visit (INDEPENDENT_AMBULATORY_CARE_PROVIDER_SITE_OTHER): Payer: No Typology Code available for payment source | Admitting: Family Medicine

## 2021-08-13 VITALS — BP 119/78 | HR 98 | Temp 98.1°F | Ht 62.0 in | Wt 231.0 lb

## 2021-08-13 DIAGNOSIS — Z9189 Other specified personal risk factors, not elsewhere classified: Secondary | ICD-10-CM

## 2021-08-13 DIAGNOSIS — D508 Other iron deficiency anemias: Secondary | ICD-10-CM | POA: Diagnosis not present

## 2021-08-13 DIAGNOSIS — I10 Essential (primary) hypertension: Secondary | ICD-10-CM

## 2021-08-13 DIAGNOSIS — R7303 Prediabetes: Secondary | ICD-10-CM

## 2021-08-13 DIAGNOSIS — F3289 Other specified depressive episodes: Secondary | ICD-10-CM

## 2021-08-13 DIAGNOSIS — K5909 Other constipation: Secondary | ICD-10-CM

## 2021-08-13 DIAGNOSIS — Z6841 Body Mass Index (BMI) 40.0 and over, adult: Secondary | ICD-10-CM

## 2021-08-13 MED ORDER — TOPIRAMATE 100 MG PO TABS: 100.0000 mg | ORAL_TABLET | Freq: Every day | ORAL | 0 refills | Status: DC

## 2021-08-13 MED ORDER — FERROUS SULFATE 325 (65 FE) MG PO TABS: 325.0000 mg | ORAL_TABLET | Freq: Every day | ORAL | 0 refills | Status: DC

## 2021-08-13 MED ORDER — LISINOPRIL-HYDROCHLOROTHIAZIDE 10-12.5 MG PO TABS: 1.0000 | ORAL_TABLET | Freq: Every day | ORAL | 0 refills | Status: DC

## 2021-08-13 MED ORDER — POLYETHYLENE GLYCOL 3350 17 GM/SCOOP PO POWD: 17.0000 g | Freq: Every day | ORAL | 0 refills | Status: DC

## 2021-08-13 MED ORDER — METFORMIN HCL 500 MG PO TABS: 500.0000 mg | ORAL_TABLET | Freq: Three times a day (TID) | ORAL | 0 refills | Status: DC

## 2021-08-13 MED ORDER — BUPROPION HCL ER (SR) 200 MG PO TB12: 200.0000 mg | ORAL_TABLET | Freq: Two times a day (BID) | ORAL | 0 refills | Status: DC

## 2021-08-13 NOTE — Progress Notes (Signed)
Chief Complaint:   OBESITY Heidi Robinson is here to discuss her progress with her obesity treatment plan along with follow-up of her obesity related diagnoses. Heidi Robinson is on the Category 2 Plan with breakfast options and states she is following her eating plan approximately 0% of the time. Heidi Robinson states she is doing 0 minutes 0 times per week.  Today's visit was #: 45 Starting weight: 258 lbs Starting date: 12/21/2017 Today's weight: 231 lbs Today's date: 08/13/2021 Total lbs lost to date: 27 Total lbs lost since last in-office visit: 0  Interim History: Heidi Robinson has struggled to stay on track. She does well for a while but she falls off track.  Subjective:   1. Pre-diabetes Ciclaly's A1c is well controlled on metformin. She notes decreased polyphagia to the point that she is not able to eat all the food on her plan.  2. Essential hypertension Heidi Robinson's blood pressure is well controlled on her medications. She is working on diet and weight loss. No hypotension or chest pain were noted.   3. Other iron deficiency anemia Heidi Robinson's last hemoglobin was low at 10.7, but improving. No side effects with FeSo4 was noted.   4. Other constipation Heidi Robinson is stable on miralax, and no side effects was noted.  5. Other depression with emotional eating Heidi Robinson has had increased stress recently, but she is dealing with it reasonably well.  6. At risk for impaired metabolic function Heidi Robinson is at increased risk for impaired metabolic function if protein drops too low.  Assessment/Plan:   1. Pre-diabetes We will refill metformin for 1 month (Heidi Robinson is ok to decrease to BID and see if she is able to eat all of her food this month). Heidi Robinson will continue to work on weight loss, exercise, and decreasing simple carbohydrates to help decrease the risk of diabetes.   - metFORMIN (GLUCOPHAGE) 500 MG tablet; Take 1 tablet (500 mg total) by mouth 3 (three) times daily.  Dispense: 90 tablet; Refill: 0  2. Essential hypertension Heidi Robinson  will continue with healthy weight loss and exercise to improve blood pressure control. We will refill lisinopril for 1 month, and watch for signs of hypotension as she continues her lifestyle modifications.  - lisinopril-hydrochlorothiazide (ZESTORETIC) 10-12.5 MG tablet; Take 1 tablet by mouth daily. Take 1 tablet by mouth daily.  Dispense: 30 tablet; Refill: 0  3. Other iron deficiency anemia We will refill FeSo4 for 1 month. Orders and follow up as documented in patient record.  - ferrous sulfate 325 (65 FE) MG tablet; Take 1 tablet (325 mg total) by mouth daily with breakfast.  Dispense: 30 tablet; Refill: 0  4. Other constipation Heidi Robinson was informed that a decrease in bowel movement frequency is normal while losing weight, but stools should not be hard or painful. We will refill miralax for 1 month. Orders and follow up as documented in patient record.   - polyethylene glycol powder (GLYCOLAX/MIRALAX) 17 GM/SCOOP powder; Take 17 g by mouth daily.  Dispense: 3350 g; Refill: 0  5. Other depression with emotional eating Behavior modification techniques were discussed today to help Heidi Robinson deal with her emotional/non-hunger eating behaviors. We will refill Wellbutrin and Topamax for 1 month. Orders and follow up as documented in patient record.   - buPROPion (WELLBUTRIN SR) 200 MG 12 hr tablet; Take 1 tablet (200 mg total) by mouth 2 (two) times daily.  Dispense: 60 tablet; Refill: 0 - topiramate (TOPAMAX) 100 MG tablet; Take 1 tablet (100 mg total) by mouth at  bedtime.  Dispense: 30 tablet; Refill: 0  6. At risk for impaired metabolic function Heidi Robinson was given approximately 15 minutes of impaired  metabolic function prevention counseling today. We discussed intensive lifestyle modifications today with an emphasis on specific nutrition and exercise instructions and strategies.   Repetitive spaced learning was employed today to elicit superior memory formation and behavioral change.  7. Obesity  with current BMI 42.4 Heidi Robinson is currently in the action stage of change. As such, her goal is to continue with weight loss efforts. She has agreed to the Category 2 Plan or keeping a food journal and adhering to recommended goals of 1200-1400 calories and 85+ grams of protein daily.   Heidi Robinson is to work on increasing her protein in the AM to help her meet her goal.  Behavioral modification strategies: increasing lean protein intake and meal planning and cooking strategies.  Heidi Robinson has agreed to follow-up with our clinic in 3 to 4 weeks. She was informed of the importance of frequent follow-up visits to maximize her success with intensive lifestyle modifications for her multiple health conditions.   Objective:   Blood pressure 119/78, pulse 98, temperature 98.1 F (36.7 C), height 5\' 2"  (1.575 m), weight 231 lb (104.8 kg), SpO2 100 %. Body mass index is 42.25 kg/m.  General: Cooperative, alert, well developed, in no acute distress. HEENT: Conjunctivae and lids unremarkable. Cardiovascular: Regular rhythm.  Lungs: Normal work of breathing. Neurologic: No focal deficits.   Lab Results  Component Value Date   CREATININE 0.93 06/11/2021   BUN 12 06/11/2021   NA 140 06/11/2021   K 3.9 06/11/2021   CL 102 06/11/2021   CO2 23 06/11/2021   Lab Results  Component Value Date   ALT 9 06/11/2021   AST 11 06/11/2021   ALKPHOS 72 06/11/2021   BILITOT <0.2 06/11/2021   Lab Results  Component Value Date   HGBA1C 4.9 06/11/2021   HGBA1C 4.8 12/13/2020   HGBA1C 5.2 07/30/2020   HGBA1C 5.0 03/11/2020   HGBA1C 4.9 09/01/2019   Lab Results  Component Value Date   INSULIN 18.9 06/11/2021   INSULIN 21.0 07/30/2020   INSULIN 17.3 03/11/2020   INSULIN 13.1 09/01/2019   INSULIN 16.7 05/11/2018   Lab Results  Component Value Date   TSH 2.310 12/13/2020   Lab Results  Component Value Date   CHOL 189 06/11/2021   HDL 45 06/11/2021   LDLCALC 125 (H) 06/11/2021   TRIG 102 06/11/2021    CHOLHDL 3.7 12/13/2020   Lab Results  Component Value Date   VD25OH 51.4 06/11/2021   VD25OH 78.7 01/13/2021   VD25OH 68.9 07/30/2020   Lab Results  Component Value Date   WBC 10.2 06/11/2021   HGB 10.7 (L) 06/11/2021   HCT 33.3 (L) 06/11/2021   MCV 71 (L) 06/11/2021   PLT 414 06/11/2021   Lab Results  Component Value Date   IRON 25 (L) 09/01/2019   TIBC 367 09/01/2019   FERRITIN 13 (L) 09/01/2019   Attestation Statements:   Reviewed by clinician on day of visit: allergies, medications, problem list, medical history, surgical history, family history, social history, and previous encounter notes.   I, Trixie Dredge, am acting as transcriptionist for Dennard Nip, MD.  I have reviewed the above documentation for accuracy and completeness, and I agree with the above. -  Dennard Nip, MD

## 2021-09-10 ENCOUNTER — Ambulatory Visit (INDEPENDENT_AMBULATORY_CARE_PROVIDER_SITE_OTHER): Payer: No Typology Code available for payment source | Admitting: Family Medicine

## 2021-09-11 ENCOUNTER — Encounter (INDEPENDENT_AMBULATORY_CARE_PROVIDER_SITE_OTHER): Payer: Self-pay | Admitting: Family Medicine

## 2021-09-11 ENCOUNTER — Ambulatory Visit (INDEPENDENT_AMBULATORY_CARE_PROVIDER_SITE_OTHER): Payer: No Typology Code available for payment source | Admitting: Family Medicine

## 2021-09-11 ENCOUNTER — Other Ambulatory Visit: Payer: Self-pay

## 2021-09-11 VITALS — BP 110/78 | HR 100 | Temp 98.3°F | Ht 62.0 in | Wt 231.0 lb

## 2021-09-11 DIAGNOSIS — Z6841 Body Mass Index (BMI) 40.0 and over, adult: Secondary | ICD-10-CM

## 2021-09-11 DIAGNOSIS — R7303 Prediabetes: Secondary | ICD-10-CM | POA: Diagnosis not present

## 2021-09-11 DIAGNOSIS — F3289 Other specified depressive episodes: Secondary | ICD-10-CM | POA: Diagnosis not present

## 2021-09-11 DIAGNOSIS — Z9189 Other specified personal risk factors, not elsewhere classified: Secondary | ICD-10-CM

## 2021-09-11 DIAGNOSIS — I10 Essential (primary) hypertension: Secondary | ICD-10-CM | POA: Diagnosis not present

## 2021-09-11 MED ORDER — METFORMIN HCL 500 MG PO TABS: 500.0000 mg | ORAL_TABLET | Freq: Three times a day (TID) | ORAL | 0 refills | Status: DC

## 2021-09-11 MED ORDER — BUPROPION HCL ER (SR) 200 MG PO TB12: 200.0000 mg | ORAL_TABLET | Freq: Two times a day (BID) | ORAL | 0 refills | Status: DC

## 2021-09-11 MED ORDER — LISINOPRIL-HYDROCHLOROTHIAZIDE 10-12.5 MG PO TABS: 1.0000 | ORAL_TABLET | Freq: Every day | ORAL | 0 refills | Status: DC

## 2021-09-11 MED ORDER — OZEMPIC (0.25 OR 0.5 MG/DOSE) 2 MG/1.5ML ~~LOC~~ SOPN: 0.5000 mg | PEN_INJECTOR | SUBCUTANEOUS | 0 refills | Status: DC

## 2021-09-11 MED ORDER — TOPIRAMATE 100 MG PO TABS: 100.0000 mg | ORAL_TABLET | Freq: Every day | ORAL | 0 refills | Status: DC

## 2021-09-11 NOTE — Progress Notes (Signed)
Chief Complaint:   OBESITY Heidi Robinson is here to discuss her progress with her obesity treatment plan along with follow-up of her obesity related diagnoses. Heidi Robinson is on the Category 2 Plan or keeping a food journal and adhering to recommended goals of 1200-1400 calories and 85+ grams of protein daily and states she is following her eating plan approximately 10% of the time. Heidi Robinson states she is doing 0 minutes 0 times per week.  Today's visit was #: 14 Starting weight: 258 lbs Starting date: 12/21/2017 Today's weight: 231 lbs Today's date: 09/11/2021 Total lbs lost to date: 27 Total lbs lost since last in-office visit: 0  Interim History: Heidi Robinson has done well maintaining her weight over Halloween. She has significant stress with working two jobs and meal planning has been difficult. She is doing more grab and go eating due to this.  Subjective:   1. Pre-diabetes Heidi Robinson is working on diet and exercise, and she is tolerating metformin well but she is still struggling with weight loss.  2. Essential hypertension Heidi Robinson's blood pressure is well controlled on lisinopril-hydrochlorothiazide.  3. Other depression with emotional eating Heidi Robinson is stable on Topamax, and no side effects were noted.  4. At risk for impaired metabolic function Heidi Robinson is at increased risk for impaired metabolic function with decreased protein.  Assessment/Plan:   1. Pre-diabetes Heidi Robinson agreed to start Ozempic 0.25 mg q weekly with no refills, and we will refill metformin for 1 month. She will continue to work on weight loss, exercise, and decreasing simple carbohydrates to help decrease the risk of diabetes.   - metFORMIN (GLUCOPHAGE) 500 MG tablet; Take 1 tablet (500 mg total) by mouth 3 (three) times daily.  Dispense: 90 tablet; Refill: 0 - Semaglutide,0.25 or 0.5MG /DOS, (OZEMPIC, 0.25 OR 0.5 MG/DOSE,) 2 MG/1.5ML SOPN; Inject 0.5 mg into the skin once a week.  Dispense: 1.5 mL; Refill: 0  2. Essential hypertension Heidi Robinson  will working on healthy weight loss, diet, and exercise. We will refill lisinopril-hydrochlorothiazide for 1 month, and will watch for signs of hypotension as she continues her lifestyle modifications.  - lisinopril-hydrochlorothiazide (ZESTORETIC) 10-12.5 MG tablet; Take 1 tablet by mouth daily. Take 1 tablet by mouth daily.  Dispense: 30 tablet; Refill: 0  3. Other depression with emotional eating Behavior modification techniques were discussed today to help Heidi Robinson deal with her emotional/non-hunger eating behaviors. We will refill Topamax and Wellbutrin SR for 1 month. Orders and follow up as documented in patient record.   - buPROPion (WELLBUTRIN SR) 200 MG 12 hr tablet; Take 1 tablet (200 mg total) by mouth 2 (two) times daily.  Dispense: 60 tablet; Refill: 0 - topiramate (TOPAMAX) 100 MG tablet; Take 1 tablet (100 mg total) by mouth at bedtime.  Dispense: 30 tablet; Refill: 0  4. At risk for impaired metabolic function Heidi Robinson was given approximately 15 minutes of impaired  metabolic function prevention counseling today. We discussed intensive lifestyle modifications today with an emphasis on specific nutrition and exercise instructions and strategies.   Repetitive spaced learning was employed today to elicit superior memory formation and behavioral change.  5. Obesity with current BMI of 42.2 Heidi Robinson is currently in the action stage of change. As such, her goal is to continue with weight loss efforts. She has agreed to the Category 2 Plan.   Behavioral modification strategies: holiday eating strategies .  Heidi Robinson has agreed to follow-up with our clinic in 4 weeks. She was informed of the importance of frequent follow-up visits to  maximize her success with intensive lifestyle modifications for her multiple health conditions.   Objective:   Blood pressure 110/78, pulse 100, temperature 98.3 F (36.8 C), height 5\' 2"  (1.575 m), weight 231 lb (104.8 kg), SpO2 100 %. Body mass index is 42.25  kg/m.  General: Cooperative, alert, well developed, in no acute distress. HEENT: Conjunctivae and lids unremarkable. Cardiovascular: Regular rhythm.  Lungs: Normal work of breathing. Neurologic: No focal deficits.   Lab Results  Component Value Date   CREATININE 0.93 06/11/2021   BUN 12 06/11/2021   NA 140 06/11/2021   K 3.9 06/11/2021   CL 102 06/11/2021   CO2 23 06/11/2021   Lab Results  Component Value Date   ALT 9 06/11/2021   AST 11 06/11/2021   ALKPHOS 72 06/11/2021   BILITOT <0.2 06/11/2021   Lab Results  Component Value Date   HGBA1C 4.9 06/11/2021   HGBA1C 4.8 12/13/2020   HGBA1C 5.2 07/30/2020   HGBA1C 5.0 03/11/2020   HGBA1C 4.9 09/01/2019   Lab Results  Component Value Date   INSULIN 18.9 06/11/2021   INSULIN 21.0 07/30/2020   INSULIN 17.3 03/11/2020   INSULIN 13.1 09/01/2019   INSULIN 16.7 05/11/2018   Lab Results  Component Value Date   TSH 2.310 12/13/2020   Lab Results  Component Value Date   CHOL 189 06/11/2021   HDL 45 06/11/2021   LDLCALC 125 (H) 06/11/2021   TRIG 102 06/11/2021   CHOLHDL 3.7 12/13/2020   Lab Results  Component Value Date   VD25OH 51.4 06/11/2021   VD25OH 78.7 01/13/2021   VD25OH 68.9 07/30/2020   Lab Results  Component Value Date   WBC 10.2 06/11/2021   HGB 10.7 (L) 06/11/2021   HCT 33.3 (L) 06/11/2021   MCV 71 (L) 06/11/2021   PLT 414 06/11/2021   Lab Results  Component Value Date   IRON 25 (L) 09/01/2019   TIBC 367 09/01/2019   FERRITIN 13 (L) 09/01/2019   Attestation Statements:   Reviewed by clinician on day of visit: allergies, medications, problem list, medical history, surgical history, family history, social history, and previous encounter notes.   I, Trixie Dredge, am acting as transcriptionist for Dennard Nip, MD.  I have reviewed the above documentation for accuracy and completeness, and I agree with the above. -  Dennard Nip, MD

## 2021-09-12 ENCOUNTER — Encounter (INDEPENDENT_AMBULATORY_CARE_PROVIDER_SITE_OTHER): Payer: Self-pay | Admitting: Family Medicine

## 2021-09-14 ENCOUNTER — Encounter (INDEPENDENT_AMBULATORY_CARE_PROVIDER_SITE_OTHER): Payer: Self-pay | Admitting: Family Medicine

## 2021-09-15 ENCOUNTER — Encounter (INDEPENDENT_AMBULATORY_CARE_PROVIDER_SITE_OTHER): Payer: Self-pay

## 2021-09-15 NOTE — Telephone Encounter (Signed)
Please see message and advise.  Thank you. ° °

## 2021-09-15 NOTE — Telephone Encounter (Signed)
Pt last seen by Dr. Beasley.  

## 2021-10-14 ENCOUNTER — Ambulatory Visit (INDEPENDENT_AMBULATORY_CARE_PROVIDER_SITE_OTHER): Payer: No Typology Code available for payment source | Admitting: Family Medicine

## 2021-10-14 ENCOUNTER — Other Ambulatory Visit: Payer: Self-pay

## 2021-10-14 ENCOUNTER — Encounter (INDEPENDENT_AMBULATORY_CARE_PROVIDER_SITE_OTHER): Payer: Self-pay | Admitting: Family Medicine

## 2021-10-14 VITALS — BP 115/79 | HR 94 | Temp 97.6°F | Ht 62.0 in | Wt 230.0 lb

## 2021-10-14 DIAGNOSIS — D508 Other iron deficiency anemias: Secondary | ICD-10-CM | POA: Diagnosis not present

## 2021-10-14 DIAGNOSIS — E8881 Metabolic syndrome: Secondary | ICD-10-CM

## 2021-10-14 DIAGNOSIS — Z9189 Other specified personal risk factors, not elsewhere classified: Secondary | ICD-10-CM

## 2021-10-14 DIAGNOSIS — Z6841 Body Mass Index (BMI) 40.0 and over, adult: Secondary | ICD-10-CM

## 2021-10-14 DIAGNOSIS — I10 Essential (primary) hypertension: Secondary | ICD-10-CM

## 2021-10-14 DIAGNOSIS — F3289 Other specified depressive episodes: Secondary | ICD-10-CM | POA: Diagnosis not present

## 2021-10-14 MED ORDER — LISINOPRIL-HYDROCHLOROTHIAZIDE 10-12.5 MG PO TABS
1.0000 | ORAL_TABLET | Freq: Every day | ORAL | 0 refills | Status: DC
Start: 2021-10-14 — End: 2021-11-17

## 2021-10-14 MED ORDER — TOPIRAMATE 100 MG PO TABS: 100.0000 mg | ORAL_TABLET | Freq: Every day | ORAL | 0 refills | Status: DC

## 2021-10-14 MED ORDER — FERROUS SULFATE 325 (65 FE) MG PO TABS: 325.0000 mg | ORAL_TABLET | Freq: Every day | ORAL | 0 refills | Status: DC

## 2021-10-14 MED ORDER — BUPROPION HCL ER (SR) 200 MG PO TB12: 200.0000 mg | ORAL_TABLET | Freq: Two times a day (BID) | ORAL | 0 refills | Status: DC

## 2021-10-14 MED ORDER — METFORMIN HCL 500 MG PO TABS: 500.0000 mg | ORAL_TABLET | Freq: Three times a day (TID) | ORAL | 0 refills | Status: DC

## 2021-10-14 NOTE — Progress Notes (Signed)
Chief Complaint:   OBESITY Heidi Robinson is here to discuss her progress with her obesity treatment plan along with follow-up of her obesity related diagnoses. Heidi Robinson is on the Category 2 Plan and states she is following her eating plan approximately 5% of the time. Heidi Robinson states she is doing 0 minutes 0 times per week.  Today's visit was #: 47 Starting weight: 258 lbs Starting date: 12/21/2017 Today's weight: 230 lbs Today's date: 10/14/2021 Total lbs lost to date: 28 Total lbs lost since last in-office visit: 1  Interim History: Heidi Robinson has done well minimizing holiday weight gain. She also did some celebration eating for her birthday. She feels she will do well over Christmas.  Subjective:   1. Other iron deficiency anemia Heidi Robinson is stable on iron supplementation, and she requests a refill.  2. Essential hypertension Heidi Robinson's blood pressure is well controlled with diet and her medications. She denies dizziness.  3. Insulin resistance Heidi Robinson is working on decreasing simple carbohydrates, and she tried to start a GLP-1 but her insurance refused.  4. Other depression with emotional eating Heidi Robinson is stable on her medications, and she is working on decreasing emotional eating behaviors. No side effects were noted.  5. At risk for diabetes mellitus Heidi Robinson is at higher than average risk for developing diabetes due to obesity.   Assessment/Plan:   1. Other iron deficiency anemia Heidi Robinson will continue iron sulfate 325 mg and we will refill for 1 month. Orders and follow up as documented in patient record.  - ferrous sulfate 325 (65 FE) MG tablet; Take 1 tablet (325 mg total) by mouth daily with breakfast.  Dispense: 30 tablet; Refill: 0  2. Essential hypertension Heidi Robinson will continue her diet and medications, and we will refill lisinopril-hydrochlorothiazide for 1 month. She will watch for signs of hypotension as she continues her lifestyle modifications.  - lisinopril-hydrochlorothiazide (ZESTORETIC)  10-12.5 MG tablet; Take 1 tablet by mouth daily. Take 1 tablet by mouth daily.  Dispense: 30 tablet; Refill: 0  3. Insulin resistance Heidi Robinson will continue her diet, exercise, and decreasing simple carbohydrates to help decrease the risk of diabetes. We will refill metformin for 1 month. Heidi Robinson agreed to follow-up with [place] as directed to closely monitor her progress.  - metFORMIN (GLUCOPHAGE) 500 MG tablet; Take 1 tablet (500 mg total) by mouth 3 (three) times daily.  Dispense: 90 tablet; Refill: 0  4. Other depression with emotional eating We will refill Wellbutrin SR and Topamax for 1 month. Behavior modification techniques were discussed today to help Heidi Robinson deal with her emotional/non-hunger eating behaviors. Orders and follow up as documented in patient record.   - buPROPion (WELLBUTRIN SR) 200 MG 12 hr tablet; Take 1 tablet (200 mg total) by mouth 2 (two) times daily.  Dispense: 60 tablet; Refill: 0 - topiramate (TOPAMAX) 100 MG tablet; Take 1 tablet (100 mg total) by mouth at bedtime.  Dispense: 30 tablet; Refill: 0  5. At risk for diabetes mellitus Heidi Robinson was given approximately 15 minutes of diabetes education and counseling today. We discussed intensive lifestyle modifications today with an emphasis on weight loss as well as increasing exercise and decreasing simple carbohydrates in her diet. We also reviewed medication options with an emphasis on risk versus benefit of those discussed.   Repetitive spaced learning was employed today to elicit superior memory formation and behavioral change.  6. Obesity BMI today is 19 Heidi Robinson is currently in the action stage of change. As such, her goal is to continue  with weight loss efforts. She has agreed to the Category 2 Plan.   Behavioral modification strategies: meal planning and cooking strategies and holiday eating strategies .  Heidi Robinson has agreed to follow-up with our clinic in 4 weeks. She was informed of the importance of frequent follow-up visits to  maximize her success with intensive lifestyle modifications for her multiple health conditions.   Objective:   Blood pressure 115/79, pulse 94, temperature 97.6 F (36.4 C), height 5\' 2"  (1.575 m), weight 230 lb (104.3 kg), last menstrual period 09/25/2021, SpO2 99 %. Body mass index is 42.07 kg/m.  General: Cooperative, alert, well developed, in no acute distress. HEENT: Conjunctivae and lids unremarkable. Cardiovascular: Regular rhythm.  Lungs: Normal work of breathing. Neurologic: No focal deficits.   Lab Results  Component Value Date   CREATININE 0.93 06/11/2021   BUN 12 06/11/2021   NA 140 06/11/2021   K 3.9 06/11/2021   CL 102 06/11/2021   CO2 23 06/11/2021   Lab Results  Component Value Date   ALT 9 06/11/2021   AST 11 06/11/2021   ALKPHOS 72 06/11/2021   BILITOT <0.2 06/11/2021   Lab Results  Component Value Date   HGBA1C 4.9 06/11/2021   HGBA1C 4.8 12/13/2020   HGBA1C 5.2 07/30/2020   HGBA1C 5.0 03/11/2020   HGBA1C 4.9 09/01/2019   Lab Results  Component Value Date   INSULIN 18.9 06/11/2021   INSULIN 21.0 07/30/2020   INSULIN 17.3 03/11/2020   INSULIN 13.1 09/01/2019   INSULIN 16.7 05/11/2018   Lab Results  Component Value Date   TSH 2.310 12/13/2020   Lab Results  Component Value Date   CHOL 189 06/11/2021   HDL 45 06/11/2021   LDLCALC 125 (H) 06/11/2021   TRIG 102 06/11/2021   CHOLHDL 3.7 12/13/2020   Lab Results  Component Value Date   VD25OH 51.4 06/11/2021   VD25OH 78.7 01/13/2021   VD25OH 68.9 07/30/2020   Lab Results  Component Value Date   WBC 10.2 06/11/2021   HGB 10.7 (L) 06/11/2021   HCT 33.3 (L) 06/11/2021   MCV 71 (L) 06/11/2021   PLT 414 06/11/2021   Lab Results  Component Value Date   IRON 25 (L) 09/01/2019   TIBC 367 09/01/2019   FERRITIN 13 (L) 09/01/2019   Attestation Statements:   Reviewed by clinician on day of visit: allergies, medications, problem list, medical history, surgical history, family history,  social history, and previous encounter notes.   I, Trixie Dredge, am acting as transcriptionist for Dennard Nip, MD.  I have reviewed the above documentation for accuracy and completeness, and I agree with the above. -  Dennard Nip, MD

## 2021-10-27 ENCOUNTER — Other Ambulatory Visit: Payer: Self-pay | Admitting: Obstetrics and Gynecology

## 2021-10-27 DIAGNOSIS — Z1231 Encounter for screening mammogram for malignant neoplasm of breast: Secondary | ICD-10-CM

## 2021-11-17 ENCOUNTER — Ambulatory Visit (INDEPENDENT_AMBULATORY_CARE_PROVIDER_SITE_OTHER): Payer: No Typology Code available for payment source | Admitting: Family Medicine

## 2021-11-17 ENCOUNTER — Encounter (INDEPENDENT_AMBULATORY_CARE_PROVIDER_SITE_OTHER): Payer: Self-pay | Admitting: Family Medicine

## 2021-11-17 ENCOUNTER — Other Ambulatory Visit: Payer: Self-pay

## 2021-11-17 VITALS — BP 109/72 | HR 91 | Temp 98.2°F | Ht 62.0 in | Wt 228.0 lb

## 2021-11-17 DIAGNOSIS — F3289 Other specified depressive episodes: Secondary | ICD-10-CM

## 2021-11-17 DIAGNOSIS — R7303 Prediabetes: Secondary | ICD-10-CM

## 2021-11-17 DIAGNOSIS — Z9189 Other specified personal risk factors, not elsewhere classified: Secondary | ICD-10-CM

## 2021-11-17 DIAGNOSIS — Z6841 Body Mass Index (BMI) 40.0 and over, adult: Secondary | ICD-10-CM

## 2021-11-17 DIAGNOSIS — I10 Essential (primary) hypertension: Secondary | ICD-10-CM | POA: Diagnosis not present

## 2021-11-17 DIAGNOSIS — D508 Other iron deficiency anemias: Secondary | ICD-10-CM | POA: Diagnosis not present

## 2021-11-17 DIAGNOSIS — K5909 Other constipation: Secondary | ICD-10-CM | POA: Diagnosis not present

## 2021-11-17 MED ORDER — BUPROPION HCL ER (SR) 200 MG PO TB12: 200.0000 mg | ORAL_TABLET | Freq: Two times a day (BID) | ORAL | 0 refills | Status: DC

## 2021-11-17 MED ORDER — TOPIRAMATE 100 MG PO TABS: 100.0000 mg | ORAL_TABLET | Freq: Every day | ORAL | 0 refills | Status: DC

## 2021-11-17 MED ORDER — POLYETHYLENE GLYCOL 3350 17 GM/SCOOP PO POWD: 17.0000 g | Freq: Every day | ORAL | 0 refills | Status: DC

## 2021-11-17 MED ORDER — LISINOPRIL-HYDROCHLOROTHIAZIDE 10-12.5 MG PO TABS: 1.0000 | ORAL_TABLET | Freq: Every day | ORAL | 0 refills | Status: DC

## 2021-11-17 MED ORDER — METFORMIN HCL 500 MG PO TABS: 500.0000 mg | ORAL_TABLET | Freq: Three times a day (TID) | ORAL | 0 refills | Status: DC

## 2021-11-17 MED ORDER — FERROUS SULFATE 325 (65 FE) MG PO TABS: 325.0000 mg | ORAL_TABLET | Freq: Every day | ORAL | 0 refills | Status: DC

## 2021-11-18 NOTE — Progress Notes (Signed)
Chief Complaint:   OBESITY Heidi Robinson is here to discuss her progress with her obesity treatment plan along with follow-up of her obesity related diagnoses. Heidi Robinson is on the Category 2 Plan and states she is following her eating plan approximately (unknown)% of the time. Heidi Robinson states she is doing 0 minutes 0 times per week.  Today's visit was #: 70 Starting weight: 258 lbs Starting date: 12/21/2017 Today's weight: 228 lbs Today's date: 11/17/2021 Total lbs lost to date: 30 Total lbs lost since last in-office visit: 2  Interim History: Heidi Robinson has done well with weight loss even over the holidays. She did have bronchitis and this has decreased her appetite but she is feeling better now and would like to continue with her Category 2 plan.  Subjective:   1. Pre-diabetes Heidi Robinson is unable to start Ozempic due to insurance refusing to cover. She still notes polyphagia.  2. Other iron deficiency anemia Heidi Robinson is stable on iron and miralax to help with iron induced constipation.  3. Essential hypertension Heidi Robinson's blood pressure is controlled on medications, and she has no signs of hypotension. She is doing well with diet, exercise, and weight loss.  4. Other depression with emotional eating Heidi Robinson is stable on her medications, and she requests a refill today.  5. At risk for diabetes mellitus Heidi Robinson is at higher than average risk for developing diabetes due to obesity.   Assessment/Plan:   1. Pre-diabetes Heidi Robinson will continue to work on weight loss, exercise, and decreasing simple carbohydrates to help decrease the risk of diabetes. We will refill metformin for 1 month.  - metFORMIN (GLUCOPHAGE) 500 MG tablet; Take 1 tablet (500 mg total) by mouth 3 (three) times daily.  Dispense: 90 tablet; Refill: 0  2. Other iron deficiency anemia We will refill FeSoy and miralax for 1 month. Orders and follow up as documented in patient record.  - ferrous sulfate 325 (65 FE) MG tablet; Take 1 tablet (325 mg  total) by mouth daily with breakfast.  Dispense: 30 tablet; Refill: 0 - polyethylene glycol powder (GLYCOLAX/MIRALAX) 17 GM/SCOOP powder; Take 17 g by mouth daily.  Dispense: 3350 g; Refill: 0  3. Essential hypertension We will refill lisinopril-hydrochlorothiazide for 1 month. Heidi Robinson will continue working on healthy weight loss and exercise to improve blood pressure control. We will watch for signs of hypotension as she continues her lifestyle modifications.  - lisinopril-hydrochlorothiazide (ZESTORETIC) 10-12.5 MG tablet; Take 1 tablet by mouth daily. Take 1 tablet by mouth daily.  Dispense: 30 tablet; Refill: 0  4. Other depression with emotional eating Behavior modification techniques were discussed today to help Heidi Robinson deal with her emotional/non-hunger eating behaviors. We will refill Wellbutrin SR and Topamax for 1 month. Orders and follow up as documented in patient record.   - buPROPion (WELLBUTRIN SR) 200 MG 12 hr tablet; Take 1 tablet (200 mg total) by mouth 2 (two) times daily.  Dispense: 60 tablet; Refill: 0 - topiramate (TOPAMAX) 100 MG tablet; Take 1 tablet (100 mg total) by mouth at bedtime.  Dispense: 30 tablet; Refill: 0  5. At risk for diabetes mellitus Heidi Robinson was given approximately 15 minutes of diabetes education and counseling today. We discussed intensive lifestyle modifications today with an emphasis on weight loss as well as increasing exercise and decreasing simple carbohydrates in her diet. We also reviewed medication options with an emphasis on risk versus benefit of those discussed.   Repetitive spaced learning was employed today to elicit superior memory formation and behavioral  change.  6. Obesity BMI today is 22 Heidi Robinson is currently in the action stage of change. As such, her goal is to continue with weight loss efforts. She has agreed to the Category 2 Plan.   We will recheck fasting labs at her next visit.  Behavioral modification strategies: increasing lean protein  intake.  Heidi Robinson has agreed to follow-up with our clinic in 4 weeks. She was informed of the importance of frequent follow-up visits to maximize her success with intensive lifestyle modifications for her multiple health conditions.   Objective:   Blood pressure 109/72, pulse 91, temperature 98.2 F (36.8 C), height 5\' 2"  (1.575 m), weight 228 lb (103.4 kg), SpO2 100 %. Body mass index is 41.7 kg/m.  General: Cooperative, alert, well developed, in no acute distress. HEENT: Conjunctivae and lids unremarkable. Cardiovascular: Regular rhythm.  Lungs: Normal work of breathing. Neurologic: No focal deficits.   Lab Results  Component Value Date   CREATININE 0.93 06/11/2021   BUN 12 06/11/2021   NA 140 06/11/2021   K 3.9 06/11/2021   CL 102 06/11/2021   CO2 23 06/11/2021   Lab Results  Component Value Date   ALT 9 06/11/2021   AST 11 06/11/2021   ALKPHOS 72 06/11/2021   BILITOT <0.2 06/11/2021   Lab Results  Component Value Date   HGBA1C 4.9 06/11/2021   HGBA1C 4.8 12/13/2020   HGBA1C 5.2 07/30/2020   HGBA1C 5.0 03/11/2020   HGBA1C 4.9 09/01/2019   Lab Results  Component Value Date   INSULIN 18.9 06/11/2021   INSULIN 21.0 07/30/2020   INSULIN 17.3 03/11/2020   INSULIN 13.1 09/01/2019   INSULIN 16.7 05/11/2018   Lab Results  Component Value Date   TSH 2.310 12/13/2020   Lab Results  Component Value Date   CHOL 189 06/11/2021   HDL 45 06/11/2021   LDLCALC 125 (H) 06/11/2021   TRIG 102 06/11/2021   CHOLHDL 3.7 12/13/2020   Lab Results  Component Value Date   VD25OH 51.4 06/11/2021   VD25OH 78.7 01/13/2021   VD25OH 68.9 07/30/2020   Lab Results  Component Value Date   WBC 10.2 06/11/2021   HGB 10.7 (L) 06/11/2021   HCT 33.3 (L) 06/11/2021   MCV 71 (L) 06/11/2021   PLT 414 06/11/2021   Lab Results  Component Value Date   IRON 25 (L) 09/01/2019   TIBC 367 09/01/2019   FERRITIN 13 (L) 09/01/2019   Attestation Statements:   Reviewed by clinician on day  of visit: allergies, medications, problem list, medical history, surgical history, family history, social history, and previous encounter notes.   I, Trixie Dredge, am acting as transcriptionist for Dennard Nip, MD.  I have reviewed the above documentation for accuracy and completeness, and I agree with the above. -  Dennard Nip, MD

## 2021-12-12 ENCOUNTER — Ambulatory Visit
Admission: RE | Admit: 2021-12-12 | Discharge: 2021-12-12 | Disposition: A | Discharge status: Home / Self Care | Payer: No Typology Code available for payment source | Source: Ambulatory Visit | Attending: Obstetrics and Gynecology | Admitting: Obstetrics and Gynecology

## 2021-12-12 DIAGNOSIS — Z1231 Encounter for screening mammogram for malignant neoplasm of breast: Secondary | ICD-10-CM

## 2021-12-15 ENCOUNTER — Other Ambulatory Visit: Payer: Self-pay | Admitting: Obstetrics and Gynecology

## 2021-12-15 DIAGNOSIS — R928 Other abnormal and inconclusive findings on diagnostic imaging of breast: Secondary | ICD-10-CM

## 2021-12-18 ENCOUNTER — Other Ambulatory Visit: Payer: Self-pay

## 2021-12-18 ENCOUNTER — Ambulatory Visit (INDEPENDENT_AMBULATORY_CARE_PROVIDER_SITE_OTHER): Payer: No Typology Code available for payment source | Admitting: Family Medicine

## 2021-12-18 ENCOUNTER — Encounter (INDEPENDENT_AMBULATORY_CARE_PROVIDER_SITE_OTHER): Payer: Self-pay | Admitting: Family Medicine

## 2021-12-18 VITALS — BP 114/77 | HR 98 | Temp 97.8°F | Ht 62.0 in | Wt 231.0 lb

## 2021-12-18 DIAGNOSIS — R7303 Prediabetes: Secondary | ICD-10-CM

## 2021-12-18 DIAGNOSIS — E669 Obesity, unspecified: Secondary | ICD-10-CM

## 2021-12-18 DIAGNOSIS — Z6841 Body Mass Index (BMI) 40.0 and over, adult: Secondary | ICD-10-CM

## 2021-12-18 DIAGNOSIS — E7800 Pure hypercholesterolemia, unspecified: Secondary | ICD-10-CM | POA: Diagnosis not present

## 2021-12-18 DIAGNOSIS — F3289 Other specified depressive episodes: Secondary | ICD-10-CM

## 2021-12-18 DIAGNOSIS — E559 Vitamin D deficiency, unspecified: Secondary | ICD-10-CM

## 2021-12-18 DIAGNOSIS — Z9189 Other specified personal risk factors, not elsewhere classified: Secondary | ICD-10-CM

## 2021-12-18 DIAGNOSIS — I10 Essential (primary) hypertension: Secondary | ICD-10-CM

## 2021-12-18 DIAGNOSIS — D508 Other iron deficiency anemias: Secondary | ICD-10-CM

## 2021-12-18 MED ORDER — METFORMIN HCL 500 MG PO TABS: 500.0000 mg | ORAL_TABLET | Freq: Three times a day (TID) | ORAL | 0 refills | Status: DC

## 2021-12-18 MED ORDER — LISINOPRIL-HYDROCHLOROTHIAZIDE 10-12.5 MG PO TABS: 1.0000 | ORAL_TABLET | Freq: Every day | ORAL | 0 refills | Status: DC

## 2021-12-18 MED ORDER — BUPROPION HCL ER (SR) 200 MG PO TB12: 200.0000 mg | ORAL_TABLET | Freq: Two times a day (BID) | ORAL | 0 refills | Status: DC

## 2021-12-18 MED ORDER — POLYETHYLENE GLYCOL 3350 17 GM/SCOOP PO POWD: 17.0000 g | Freq: Every day | ORAL | 0 refills | Status: DC

## 2021-12-18 MED ORDER — TOPIRAMATE 100 MG PO TABS: 100.0000 mg | ORAL_TABLET | Freq: Every day | ORAL | 0 refills | Status: DC

## 2021-12-18 NOTE — Progress Notes (Signed)
Chief Complaint:   OBESITY Heidi Robinson is here to discuss her progress with her obesity treatment plan along with follow-up of her obesity related diagnoses. Heidi Robinson is on the Category 2 Plan and states she is following her eating plan approximately 10% of the time. Heidi Robinson states she is doing online fitness for 30 minutes 5 times per week.  Today's visit was #: 37 Starting weight: 258 lbs Starting date: 12/21/2017 Today's weight: 231 lbs Today's date: 12/18/2021 Total lbs lost to date: 27 Total lbs lost since last in-office visit: 0  Interim History: Heidi Robinson is retaining some fluid today. She is working on getting back to her structured eating plan.  Subjective:   1. Pure hypercholesterolemia Heidi Robinson is working on diet and exercise, and her last LDL was elevated.  2. Vitamin D deficiency Heidi Robinson on Vit D, and last lab was at goal.  3. Pre-diabetes Heidi Robinson is stable on metformin, and she is working on diet and exercise.  4. Essential hypertension Heidi Robinson's blood pressure is stable on her medications. She denies chest pain and she is working on her diet. She is due for labs.  5. Other iron deficiency anemia Heidi Robinson has a history of sickle cell trait and heavy cycles.  6. Other depression with emotional eating Heidi Robinson is stable on her medications, and she denies chest pain. She is working on diet and she is due for labs.  7. At risk for diabetes mellitus Heidi Robinson is at higher than average risk for developing diabetes due to her obesity.  Assessment/Plan:   1. Pure hypercholesterolemia Cardiovascular risk and specific lipid/LDL goals reviewed. We discussed several lifestyle modifications today. We will check labs today. Heidi Robinson will continue to work on diet, exercise and weight loss efforts. Orders and follow up as documented in patient record.   - Lipid Panel With LDL/HDL Ratio  2. Vitamin D deficiency Low Vitamin D level contributes to fatigue and are associated with obesity, breast, and colon cancer. We  will check labs today. Heidi Robinson will follow-up for routine testing of Vitamin D, at least 2-3 times per year to avoid over-replacement.  - VITAMIN D 25 Hydroxy (Vit-D Deficiency, Fractures)  3. Pre-diabetes We will check labs today, and we will refill metformin for 1 month. Heidi Robinson will continue to work on weight loss, exercise, and decreasing simple carbohydrates to help decrease the risk of diabetes.   - metFORMIN (GLUCOPHAGE) 500 MG tablet; Take 1 tablet (500 mg total) by mouth 3 (three) times daily.  Dispense: 90 tablet; Refill: 0 - CMP14+EGFR - Insulin, random - Hemoglobin A1c  4. Essential hypertension We will refill lisinopril-hydrochlorothiazide for 1 month. Heidi Robinson will continue working on healthy weight loss and exercise to improve blood pressure control.  - lisinopril-hydrochlorothiazide (ZESTORETIC) 10-12.5 MG tablet; Take 1 tablet by mouth daily. Take 1 tablet by mouth daily.  Dispense: 30 tablet; Refill: 0  5. Other iron deficiency anemia We will check labs today, and we will refill miralax for 1 month. Orders and follow up as documented in patient record.  - polyethylene glycol powder (GLYCOLAX/MIRALAX) 17 GM/SCOOP powder; Take 17 g by mouth daily.  Dispense: 3350 g; Refill: 0 - CBC with Differential/Platelet  6. Other depression with emotional eating Behavior modification techniques were discussed today to help Heidi Robinson with her emotional/non-hunger eating behaviors. We will refill Topamax and Wellbutrin SR for 1 month. Orders and follow up as documented in patient record.   - buPROPion (WELLBUTRIN SR) 200 MG 12 hr tablet; Take 1 tablet (  200 mg total) by mouth 2 (two) times daily.  Dispense: 60 tablet; Refill: 0 - topiramate (TOPAMAX) 100 MG tablet; Take 1 tablet (100 mg total) by mouth at bedtime.  Dispense: 30 tablet; Refill: 0  7. At risk for diabetes mellitus Heidi Robinson was given approximately 15 minutes of diabetic education and counseling today. We discussed intensive lifestyle  modifications today with an emphasis on weight loss as well as increasing exercise and decreasing simple carbohydrates in her diet. We also reviewed medication options with an emphasis on risk versus benefits of those discussed.  Repetitive spaced learning was employed today to elicit superior memory formation and behavioral change.  8. Obesity with current BMI 42.3 Heidi Robinson is currently in the action stage of change. As such, her goal is to continue with weight loss efforts. She has agreed to the Category 2 Plan.   Exercise goals: As is.  Behavioral modification strategies: increasing lean protein intake.  Heidi Robinson has agreed to follow-up with our clinic in 3 to 4 weeks. She was informed of the importance of frequent follow-up visits to maximize her success with intensive lifestyle modifications for her multiple health conditions.   Heidi Robinson was informed we would discuss her lab results at her next visit unless there is a critical issue that needs to be addressed sooner. Heidi Robinson agreed to keep her next visit at the agreed upon time to discuss these results.  Objective:   Blood pressure 114/77, pulse 98, temperature 97.8 F (36.6 C), height 5' 2" (1.575 m), weight 231 lb (104.8 kg), SpO2 100 %. Body mass index is 42.25 kg/m.  General: Cooperative, alert, well developed, in no acute distress. HEENT: Conjunctivae and lids unremarkable. Cardiovascular: Regular rhythm.  Lungs: Normal work of breathing. Neurologic: No focal deficits.   Lab Results  Component Value Date   CREATININE 0.93 06/11/2021   BUN 12 06/11/2021   NA 140 06/11/2021   K 3.9 06/11/2021   CL 102 06/11/2021   CO2 23 06/11/2021   Lab Results  Component Value Date   ALT 9 06/11/2021   AST 11 06/11/2021   ALKPHOS 72 06/11/2021   BILITOT <0.2 06/11/2021   Lab Results  Component Value Date   HGBA1C 4.9 06/11/2021   HGBA1C 4.8 12/13/2020   HGBA1C 5.2 07/30/2020   HGBA1C 5.0 03/11/2020   HGBA1C 4.9 09/01/2019   Lab Results   Component Value Date   INSULIN 18.9 06/11/2021   INSULIN 21.0 07/30/2020   INSULIN 17.3 03/11/2020   INSULIN 13.1 09/01/2019   INSULIN 16.7 05/11/2018   Lab Results  Component Value Date   TSH 2.310 12/13/2020   Lab Results  Component Value Date   CHOL 189 06/11/2021   HDL 45 06/11/2021   LDLCALC 125 (H) 06/11/2021   TRIG 102 06/11/2021   CHOLHDL 3.7 12/13/2020   Lab Results  Component Value Date   VD25OH 51.4 06/11/2021   VD25OH 78.7 01/13/2021   VD25OH 68.9 07/30/2020   Lab Results  Component Value Date   WBC 10.2 06/11/2021   HGB 10.7 (L) 06/11/2021   HCT 33.3 (L) 06/11/2021   MCV 71 (L) 06/11/2021   PLT 414 06/11/2021   Lab Results  Component Value Date   IRON 25 (L) 09/01/2019   TIBC 367 09/01/2019   FERRITIN 13 (L) 09/01/2019   Attestation Statements:   Reviewed by clinician on day of visit: allergies, medications, problem list, medical history, surgical history, family history, social history, and previous encounter notes.   Wilhemena Durie, am  acting as transcriptionist for Dennard Nip, MD.  I have reviewed the above documentation for accuracy and completeness, and I agree with the above. -  Dennard Nip, MD

## 2021-12-19 LAB — CBC WITH DIFFERENTIAL/PLATELET
Basophils Absolute: 0.1 10*3/uL (ref 0.0–0.2)
Basos: 1 %
EOS (ABSOLUTE): 0.3 10*3/uL (ref 0.0–0.4)
Eos: 2 %
Hematocrit: 32.9 % — ABNORMAL LOW (ref 34.0–46.6)
Hemoglobin: 9.9 g/dL — ABNORMAL LOW (ref 11.1–15.9)
Immature Grans (Abs): 0.1 10*3/uL (ref 0.0–0.1)
Immature Granulocytes: 1 %
Lymphocytes Absolute: 1.6 10*3/uL (ref 0.7–3.1)
Lymphs: 15 %
MCH: 20.2 pg — ABNORMAL LOW (ref 26.6–33.0)
MCHC: 30.1 g/dL — ABNORMAL LOW (ref 31.5–35.7)
MCV: 67 fL — ABNORMAL LOW (ref 79–97)
Monocytes Absolute: 0.7 10*3/uL (ref 0.1–0.9)
Monocytes: 6 %
Neutrophils Absolute: 8.3 10*3/uL — ABNORMAL HIGH (ref 1.4–7.0)
Neutrophils: 75 %
Platelets: 501 10*3/uL — ABNORMAL HIGH (ref 150–450)
RBC: 4.91 x10E6/uL (ref 3.77–5.28)
RDW: 18.9 % — ABNORMAL HIGH (ref 11.7–15.4)
WBC: 10.9 10*3/uL — ABNORMAL HIGH (ref 3.4–10.8)

## 2021-12-19 LAB — CMP14+EGFR
ALT: 10 IU/L (ref 0–32)
AST: 11 IU/L (ref 0–40)
Albumin/Globulin Ratio: 1.9 (ref 1.2–2.2)
Albumin: 4.5 g/dL (ref 3.8–4.8)
Alkaline Phosphatase: 74 IU/L (ref 44–121)
BUN/Creatinine Ratio: 13 (ref 9–23)
BUN: 10 mg/dL (ref 6–24)
Bilirubin Total: <0.2 mg/dL (ref 0.0–1.2)
CO2: 23 mmol/L (ref 20–29)
Calcium: 9.5 mg/dL (ref 8.7–10.2)
Chloride: 101 mmol/L (ref 96–106)
Creatinine, Ser: 0.77 mg/dL (ref 0.57–1.00)
Globulin, Total: 2.4 g/dL (ref 1.5–4.5)
Glucose: 77 mg/dL (ref 70–99)
Potassium: 4.3 mmol/L (ref 3.5–5.2)
Sodium: 141 mmol/L (ref 134–144)
Total Protein: 6.9 g/dL (ref 6.0–8.5)
eGFR: 97 mL/min/{1.73_m2} (ref >59)

## 2021-12-19 LAB — INSULIN, RANDOM: INSULIN: 16.6 u[IU]/mL (ref 2.6–24.9)

## 2021-12-19 LAB — LIPID PANEL WITH LDL/HDL RATIO
Cholesterol, Total: 189 mg/dL (ref 100–199)
HDL: 45 mg/dL (ref >39)
LDL Chol Calc (NIH): 120 mg/dL — ABNORMAL HIGH (ref 0–99)
LDL/HDL Ratio: 2.7 ratio (ref 0.0–3.2)
Triglycerides: 135 mg/dL (ref 0–149)
VLDL Cholesterol Cal: 24 mg/dL (ref 5–40)

## 2021-12-19 LAB — HEMOGLOBIN A1C
Est. average glucose Bld gHb Est-mCnc: 108 mg/dL
Hgb A1c MFr Bld: 5.4 % (ref 4.8–5.6)

## 2021-12-19 LAB — VITAMIN D 25 HYDROXY (VIT D DEFICIENCY, FRACTURES): Vit D, 25-Hydroxy: 47.5 ng/mL (ref 30.0–100.0)

## 2021-12-30 ENCOUNTER — Ambulatory Visit
Admission: RE | Admit: 2021-12-30 | Discharge: 2021-12-30 | Disposition: A | Discharge status: Home / Self Care | Payer: No Typology Code available for payment source | Source: Ambulatory Visit | Attending: Obstetrics and Gynecology | Admitting: Obstetrics and Gynecology

## 2021-12-30 ENCOUNTER — Other Ambulatory Visit: Payer: Self-pay

## 2021-12-30 DIAGNOSIS — R928 Other abnormal and inconclusive findings on diagnostic imaging of breast: Secondary | ICD-10-CM

## 2022-01-19 ENCOUNTER — Ambulatory Visit (INDEPENDENT_AMBULATORY_CARE_PROVIDER_SITE_OTHER): Payer: No Typology Code available for payment source | Admitting: Family Medicine

## 2022-01-26 ENCOUNTER — Ambulatory Visit (INDEPENDENT_AMBULATORY_CARE_PROVIDER_SITE_OTHER): Payer: No Typology Code available for payment source | Admitting: Nurse Practitioner

## 2022-01-29 ENCOUNTER — Encounter (INDEPENDENT_AMBULATORY_CARE_PROVIDER_SITE_OTHER): Payer: Self-pay

## 2022-02-11 ENCOUNTER — Other Ambulatory Visit (INDEPENDENT_AMBULATORY_CARE_PROVIDER_SITE_OTHER): Payer: Self-pay | Admitting: Family Medicine

## 2022-02-11 DIAGNOSIS — I10 Essential (primary) hypertension: Secondary | ICD-10-CM

## 2022-02-13 ENCOUNTER — Other Ambulatory Visit (INDEPENDENT_AMBULATORY_CARE_PROVIDER_SITE_OTHER): Payer: Self-pay | Admitting: Family Medicine

## 2022-02-13 DIAGNOSIS — I10 Essential (primary) hypertension: Secondary | ICD-10-CM

## 2022-02-16 MED ORDER — LISINOPRIL-HYDROCHLOROTHIAZIDE 10-12.5 MG PO TABS: 1.0000 | ORAL_TABLET | Freq: Every day | ORAL | 0 refills | Status: DC

## 2022-02-16 NOTE — Telephone Encounter (Signed)
LAST APPOINTMENT DATE: 12/18/21 ?NEXT APPOINTMENT DATE: 02/19/22 ? ? ?[facility] [place] ?[place]) [place] [address] ?Phone: [phone] Fax: [phone] ? ?Patient is requesting a refill of the following medications: ?Requested Prescriptions  ? ?Pending Prescriptions Disp Refills  ? lisinopril-hydrochlorothiazide (ZESTORETIC) 10-12.5 MG tablet 30 tablet 0  ?  Sig: Take 1 tablet by mouth daily. Take 1 tablet by mouth daily.  ? ? ?Date last filled: 02*09/23 ?Previously prescribed by Dr. Leafy Ro ? ?Lab Results  ?Component Value Date  ? HGBA1C 5.4 12/18/2021  ? HGBA1C 4.9 06/11/2021  ? HGBA1C 4.8 12/13/2020  ? ?Lab Results  ?Component Value Date  ? LDLCALC 120 (H) 12/18/2021  ? CREATININE 0.77 12/18/2021  ? ?Lab Results  ?Component Value Date  ? VD25OH 47.5 12/18/2021  ? VD25OH 51.4 06/11/2021  ? VD25OH 78.7 01/13/2021  ? ? ?BP Readings from Last 3 Encounters:  ?12/18/21 114/77  ?11/17/21 109/72  ?10/14/21 115/79  ? ? ?

## 2022-02-19 ENCOUNTER — Ambulatory Visit (INDEPENDENT_AMBULATORY_CARE_PROVIDER_SITE_OTHER): Payer: No Typology Code available for payment source | Admitting: Nurse Practitioner

## 2022-02-19 ENCOUNTER — Encounter (INDEPENDENT_AMBULATORY_CARE_PROVIDER_SITE_OTHER): Payer: Self-pay | Admitting: Nurse Practitioner

## 2022-02-19 VITALS — BP 125/78 | HR 97 | Temp 98.0°F | Ht 62.0 in | Wt 231.0 lb

## 2022-02-19 DIAGNOSIS — D508 Other iron deficiency anemias: Secondary | ICD-10-CM

## 2022-02-19 DIAGNOSIS — I10 Essential (primary) hypertension: Secondary | ICD-10-CM

## 2022-02-19 DIAGNOSIS — Z9189 Other specified personal risk factors, not elsewhere classified: Secondary | ICD-10-CM

## 2022-02-19 DIAGNOSIS — F3289 Other specified depressive episodes: Secondary | ICD-10-CM

## 2022-02-19 DIAGNOSIS — E669 Obesity, unspecified: Secondary | ICD-10-CM

## 2022-02-19 DIAGNOSIS — Z6841 Body Mass Index (BMI) 40.0 and over, adult: Secondary | ICD-10-CM

## 2022-02-19 DIAGNOSIS — R7303 Prediabetes: Secondary | ICD-10-CM | POA: Diagnosis not present

## 2022-02-19 MED ORDER — TOPIRAMATE 100 MG PO TABS: 100.0000 mg | ORAL_TABLET | Freq: Every day | ORAL | 0 refills | Status: DC

## 2022-02-19 MED ORDER — METFORMIN HCL 500 MG PO TABS: 500.0000 mg | ORAL_TABLET | Freq: Three times a day (TID) | ORAL | 0 refills | Status: DC

## 2022-02-19 MED ORDER — LISINOPRIL-HYDROCHLOROTHIAZIDE 10-12.5 MG PO TABS: 1.0000 | ORAL_TABLET | Freq: Every day | ORAL | 0 refills | Status: DC

## 2022-02-19 MED ORDER — BUPROPION HCL ER (SR) 200 MG PO TB12: 200.0000 mg | ORAL_TABLET | Freq: Two times a day (BID) | ORAL | 0 refills | Status: DC

## 2022-02-19 MED ORDER — FERROUS SULFATE 325 (65 FE) MG PO TABS: 325.0000 mg | ORAL_TABLET | Freq: Every day | ORAL | 0 refills | Status: DC

## 2022-03-04 NOTE — Progress Notes (Signed)
? ? ? ?Chief Complaint:  ? ?OBESITY ?Heidi Robinson is here to discuss her progress with her obesity treatment plan along with follow-up of her obesity related diagnoses. Heidi Robinson is on the Category 2 Plan and states she is following her eating plan approximately 10% of the time. Heidi Robinson states she is doing 0 minutes 0 times per week. ? ?Today's visit was #: 57 ?Starting weight: 258 lbs ?Starting date: 12/21/2017 ?Today's weight: 231 lbs ?Today's date: 02/19/2022 ?Total lbs lost to date: 27 lbs ?Total lbs lost since last in-office visit: 0 ? ?Interim History: Heidi Robinson struggles with skipping meals and cravings. She would like to start back on Category 2 plan. She was prescribed Ozempic in the past for insulin resistance. She was unable to start due to cost.  ? ?Subjective:  ? ?1. Essential hypertension ?Heidi Robinson is currently taking lisinopril-HCT 10-12.5 mg. She denies side effects chest pain, shortness of breath, and palpitations.  ? ?2. Pre-diabetes ?Heidi Robinson's last A1C was 5.4. She was taking Metformin 500 mg three times daily on a regular basis 1 week ago. She reports side effects of diarrhea.  ? ?3. Other iron deficiency anemia ?Heidi Robinson is taking iron on a regular basis due to side effects of constipation. She has been taking on a regular basic since her last last visit. ? ?4. Other depression with emotional eating ?Heidi Robinson is currently taking Topamax 100 mg on a regular basic for the past week. She denies side effects. She is not sure if beneficial. She is also taking Wellbutrin SR 200 mg twice daily.  ? ?5. At risk for side effect of medication ?Heidi Robinson is at risk for side effect of medication due restarting Ozempic.  ? ?Assessment/Plan:  ? ?1. Essential hypertension ?Curstin is working on healthy weight loss and exercise to improve blood pressure control. We will watch for signs of hypotension as she continues her lifestyle modifications. We will refill lisinopril/HCT 10-12.5 mg for 1 month with no refills. We discussed side effects.  ? ?-  lisinopril-hydrochlorothiazide (ZESTORETIC) 10-12.5 MG tablet; Take 1 tablet by mouth daily. Take 1 tablet by mouth daily.  Dispense: 30 tablet; Refill: 0 ? ?2. Pre-diabetes ?Stephania will continue to work on weight loss, exercise, and decreasing simple carbohydrates to help decrease the risk of diabetes. We will refill Metformin 500 mg for 1 month with no refills.  ? ?- metFORMIN (GLUCOPHAGE) 500 MG tablet; Take 1 tablet (500 mg total) by mouth 3 (three) times daily.  Dispense: 90 tablet; Refill: 0 ? ?3. Other iron deficiency anemia ?Orders and follow up as documented in patient record. We will refill iron 325 for 1 month with no refills. We discussed iron infusions.  ? ?Counseling ?Iron is essential for our bodies to make red blood cells.  Reasons that someone may be deficient include: an iron-deficient diet (more likely in those following vegan or vegetarian diets), women with heavy menses, patients with GI disorders or poor absorption, patients that have had bariatric surgery, frequent blood donors, patients with cancer, and patients with heart disease.   ?An iron supplement has been recommended. This is found over-the-counter.  ?Iron-rich foods include dark leafy greens, red and white meats, eggs, seafood, and beans.   ?Certain foods and drinks prevent your body from absorbing iron properly. Avoid eating these foods in the same meal as iron-rich foods or with iron supplements. These foods include: coffee, black tea, and red wine; milk, dairy products, and foods that are high in calcium; beans and soybeans; whole grains.  ?Constipation can  be a side effect of iron supplementation. Increased water and fiber intake are helpful. Water goal: > 2 liters/day. Fiber goal: > 25 grams/day.  ? ?- ferrous sulfate 325 (65 FE) MG tablet; Take 1 tablet (325 mg total) by mouth daily with breakfast.  Dispense: 30 tablet; Refill: 0 ? ?4. Other depression with emotional eating ?Behavior modification techniques were discussed today to  help Heidi Robinson deal with her emotional/non-hunger eating behaviors. We will refill Topamax 100 mg for  for 1 month with no refills. She will take 1/2 tablet twice daily. We will refill Wellbutrin SR 200 mg for 1 month with no refills. We discussed iron infusion. Orders and follow up as documented in patient record.  ? ?- buPROPion (WELLBUTRIN SR) 200 MG 12 hr tablet; Take 1 tablet (200 mg total) by mouth 2 (two) times daily.  Dispense: 60 tablet; Refill: 0 ?- topiramate (TOPAMAX) 100 MG tablet; Take 1 tablet (100 mg total) by mouth at bedtime.  Dispense: 30 tablet; Refill: 0 ? ?5. At risk for side effect of medication ?Heidi Robinson was given approximately 15 minutes of drug side effect counseling today.  We discussed side effect possibility and risk versus benefits. Heidi Robinson agreed to the medication and will contact this office if these side effects are intolerable. ? ?Repetitive spaced learning was employed today to elicit superior memory formation and behavioral change.  ? ?6. Obesity with current BMI 42.4 ?Heidi Robinson is currently in the action stage of change. As such, her goal is to continue with weight loss efforts. She has agreed to the Category 2 Plan.  ? ?Handouts: Eating Out was provided today.  ? ?Exercise goals:  Heidi Robinson agrees to start walking.  ? ?Behavioral modification strategies: increasing lean protein intake, increasing water intake, and no skipping meals. ? ?Heidi Robinson has agreed to follow-up with our clinic in 3-4 weeks. She was informed of the importance of frequent follow-up visits to maximize her success with intensive lifestyle modifications for her multiple health conditions.  ? ?Objective:  ? ?Blood pressure 125/78, pulse 97, temperature 98 ?F (36.7 ?C), height '5\' 2"'$  (1.575 m), weight 231 lb (104.8 kg), SpO2 99 %. ?Body mass index is 42.25 kg/m?. ? ?General: Cooperative, alert, well developed, in no acute distress. ?HEENT: Conjunctivae and lids unremarkable. ?Cardiovascular: Regular rhythm.  ?Lungs: Normal work of  breathing. ?Neurologic: No focal deficits.  ? ?Lab Results  ?Component Value Date  ? CREATININE 0.77 12/18/2021  ? BUN 10 12/18/2021  ? NA 141 12/18/2021  ? K 4.3 12/18/2021  ? CL 101 12/18/2021  ? CO2 23 12/18/2021  ? ?Lab Results  ?Component Value Date  ? ALT 10 12/18/2021  ? AST 11 12/18/2021  ? ALKPHOS 74 12/18/2021  ? BILITOT <0.2 12/18/2021  ? ?Lab Results  ?Component Value Date  ? HGBA1C 5.4 12/18/2021  ? HGBA1C 4.9 06/11/2021  ? HGBA1C 4.8 12/13/2020  ? HGBA1C 5.2 07/30/2020  ? HGBA1C 5.0 03/11/2020  ? ?Lab Results  ?Component Value Date  ? INSULIN 16.6 12/18/2021  ? INSULIN 18.9 06/11/2021  ? INSULIN 21.0 07/30/2020  ? INSULIN 17.3 03/11/2020  ? INSULIN 13.1 09/01/2019  ? ?Lab Results  ?Component Value Date  ? TSH 2.310 12/13/2020  ? ?Lab Results  ?Component Value Date  ? CHOL 189 12/18/2021  ? HDL 45 12/18/2021  ? LDLCALC 120 (H) 12/18/2021  ? TRIG 135 12/18/2021  ? CHOLHDL 3.7 12/13/2020  ? ?Lab Results  ?Component Value Date  ? VD25OH 47.5 12/18/2021  ? VD25OH 51.4 06/11/2021  ?  VD25OH 78.7 01/13/2021  ? ?Lab Results  ?Component Value Date  ? WBC 10.9 (H) 12/18/2021  ? HGB 9.9 (L) 12/18/2021  ? HCT 32.9 (L) 12/18/2021  ? MCV 67 (L) 12/18/2021  ? PLT 501 (H) 12/18/2021  ? ?Lab Results  ?Component Value Date  ? IRON 25 (L) 09/01/2019  ? TIBC 367 09/01/2019  ? FERRITIN 13 (L) 09/01/2019  ? ?Attestation Statements:  ? ?Reviewed by clinician on day of visit: allergies, medications, problem list, medical history, surgical history, family history, social history, and previous encounter notes. ? ?I, Lizbeth Bark, RMA, am acting as Location manager for Everardo Pacific, FNP. ? ?I have reviewed the above documentation for accuracy and completeness, and I agree with the above. Everardo Pacific, FNP  ?

## 2022-03-24 ENCOUNTER — Encounter (INDEPENDENT_AMBULATORY_CARE_PROVIDER_SITE_OTHER): Payer: Self-pay | Admitting: Nurse Practitioner

## 2022-03-24 ENCOUNTER — Ambulatory Visit (INDEPENDENT_AMBULATORY_CARE_PROVIDER_SITE_OTHER): Payer: No Typology Code available for payment source | Admitting: Nurse Practitioner

## 2022-03-24 VITALS — BP 114/77 | HR 101 | Temp 98.0°F | Ht 62.0 in | Wt 237.0 lb

## 2022-03-24 DIAGNOSIS — R7303 Prediabetes: Secondary | ICD-10-CM | POA: Diagnosis not present

## 2022-03-24 DIAGNOSIS — I10 Essential (primary) hypertension: Secondary | ICD-10-CM | POA: Diagnosis not present

## 2022-03-24 DIAGNOSIS — Z6841 Body Mass Index (BMI) 40.0 and over, adult: Secondary | ICD-10-CM

## 2022-03-24 DIAGNOSIS — E669 Obesity, unspecified: Secondary | ICD-10-CM

## 2022-03-24 DIAGNOSIS — D508 Other iron deficiency anemias: Secondary | ICD-10-CM | POA: Diagnosis not present

## 2022-03-24 DIAGNOSIS — F3289 Other specified depressive episodes: Secondary | ICD-10-CM | POA: Diagnosis not present

## 2022-03-24 MED ORDER — METFORMIN HCL 500 MG PO TABS: 500.0000 mg | ORAL_TABLET | Freq: Three times a day (TID) | ORAL | 0 refills | Status: DC

## 2022-03-24 MED ORDER — LISINOPRIL-HYDROCHLOROTHIAZIDE 10-12.5 MG PO TABS: 1.0000 | ORAL_TABLET | Freq: Every day | ORAL | 0 refills | Status: DC

## 2022-03-24 MED ORDER — FERROUS SULFATE 325 (65 FE) MG PO TABS: 325.0000 mg | ORAL_TABLET | Freq: Every day | ORAL | 0 refills | Status: DC

## 2022-03-24 MED ORDER — BUPROPION HCL ER (SR) 200 MG PO TB12: 200.0000 mg | ORAL_TABLET | Freq: Two times a day (BID) | ORAL | 0 refills | Status: DC

## 2022-03-24 MED ORDER — TOPIRAMATE 100 MG PO TABS: 100.0000 mg | ORAL_TABLET | Freq: Every day | ORAL | 0 refills | Status: DC

## 2022-03-25 NOTE — Progress Notes (Signed)
Chief Complaint:   OBESITY Heidi Robinson is here to discuss her progress with her obesity treatment plan along with follow-up of her obesity related diagnoses. Heidi Robinson is on the Category 2 Plan and states she is following her eating plan approximately 0% of the time. Heidi Robinson states she is doing 0 minutes 0 times per week.  Today's visit was #: 45 Starting weight: 258 lbs Starting date: 12/21/2017 Today's weight: 237 lbs Today's date: 03/24/2022 Total lbs lost to date: 21 lbs Total lbs lost since last in-office visit: 0  Interim History: Heidi Robinson has been under a lot of stress since her last visit and has gotten off track. She has been eating out more and hasn't made the best choices. She is drinking water but not enough. She is also drinking sodas and tea. She took phentermine in the past for medical weight loss. She also has tried over the counter & HCG injections.   Subjective:   1. Essential hypertension Heidi Robinson is taking Zestoretic 10-12.5 mg. She denies side effects chest pain, shortness or palpitations.   2. Pre-diabetes Heidi Robinson's last insulin was 16.6 and A1C was 5.4. She is taking Metformin 500 mg. Denies side effects.   3. Other iron deficiency anemia Heidi Robinson is currently taking iron 325 mg. She denies side effects. She denies constipation. EGD 12/20/2009.  4. Other depression with emotional eating Heidi Robinson is taking Wellbutrin SR 200 mg and Topamax 100 mg at bedtime. She denies side effects.   Assessment/Plan:   1. Essential hypertension Heidi Robinson is working on healthy weight loss and exercise to improve blood pressure control. We will watch for signs of hypotension as she continues her lifestyle modifications. We will refill Zestoretic 10-12.5 mg. We discussed side effects.   - lisinopril-hydrochlorothiazide (ZESTORETIC) 10-12.5 MG tablet; Take 1 tablet by mouth daily. Take 1 tablet by mouth daily.  Dispense: 30 tablet; Refill: 0  2. Pre-diabetes Heidi Robinson will continue to work on weight loss, exercise,  and decreasing simple carbohydrates to help decrease the risk of diabetes. We will refill Metformin 500 mg 3 times per day. We discussed side effects.   - metFORMIN (GLUCOPHAGE) 500 MG tablet; Take 1 tablet (500 mg total) by mouth 3 (three) times daily.  Dispense: 90 tablet; Refill: 0  3. Other iron deficiency anemia Orders and follow up as documented in patient record. We will refill iron 325 mg for 1 month with no side effects. We discussed side effects.   Counseling Iron is essential for our bodies to make red blood cells.  Reasons that someone may be deficient include: an iron-deficient diet (more likely in those following vegan or vegetarian diets), women with heavy menses, patients with GI disorders or poor absorption, patients that have had bariatric surgery, frequent blood donors, patients with cancer, and patients with heart disease.   An iron supplement has been recommended. This is found over-the-counter.  Iron-rich foods include dark leafy greens, red and white meats, eggs, seafood, and beans.   Certain foods and drinks prevent your body from absorbing iron properly. Avoid eating these foods in the same meal as iron-rich foods or with iron supplements. These foods include: coffee, black tea, and red wine; milk, dairy products, and foods that are high in calcium; beans and soybeans; whole grains.  Constipation can be a side effect of iron supplementation. Increased water and fiber intake are helpful. Water goal: > 2 liters/day. Fiber goal: > 25 grams/day.   - ferrous sulfate 325 (65 FE) MG tablet; Take 1 tablet (  325 mg total) by mouth daily with breakfast.  Dispense: 30 tablet; Refill: 0  4. Other depression with emotional eating Behavior modification techniques were discussed today to help Heidi Robinson deal with her emotional/non-hunger eating behaviors.  We will refill Wellbutrin SR 200 mg twice daily and Topamax 100 mg for 1 month with no refills. We discussed side effects. Orders and follow  up as documented in patient record.   - buPROPion (WELLBUTRIN SR) 200 MG 12 hr tablet; Take 1 tablet (200 mg total) by mouth 2 (two) times daily.  Dispense: 60 tablet; Refill: 0 - topiramate (TOPAMAX) 100 MG tablet; Take 1 tablet (100 mg total) by mouth at bedtime.  Dispense: 30 tablet; Refill: 0  5. Obesity with current BMI 43.3 Heidi Robinson is currently in the action stage of change. As such, her goal is to continue with weight loss efforts. She has agreed to the Category 2 Plan.   Heidi Robinson will consider Contave at next visit (mail order) if following plan on a regular basis and with hunger/craving.   Exercise goals: No exercise has been prescribed at this time.  Behavioral modification strategies: increasing water intake, no skipping meals, and planning for success.  Heidi Robinson has agreed to follow-up with our clinic in 4 weeks. She was informed of the importance of frequent follow-up visits to maximize her success with intensive lifestyle modifications for her multiple health conditions.   Objective:   Blood pressure 114/77, pulse (!) 101, temperature 98 F (36.7 C), height '5\' 2"'$  (1.575 m), weight 237 lb (107.5 kg), SpO2 100 %. Body mass index is 43.35 kg/m.  General: Cooperative, alert, well developed, in no acute distress. HEENT: Conjunctivae and lids unremarkable. Cardiovascular: Regular rhythm.  Lungs: Normal work of breathing. Neurologic: No focal deficits.   Lab Results  Component Value Date   CREATININE 0.77 12/18/2021   BUN 10 12/18/2021   NA 141 12/18/2021   K 4.3 12/18/2021   CL 101 12/18/2021   CO2 23 12/18/2021   Lab Results  Component Value Date   ALT 10 12/18/2021   AST 11 12/18/2021   ALKPHOS 74 12/18/2021   BILITOT <0.2 12/18/2021   Lab Results  Component Value Date   HGBA1C 5.4 12/18/2021   HGBA1C 4.9 06/11/2021   HGBA1C 4.8 12/13/2020   HGBA1C 5.2 07/30/2020   HGBA1C 5.0 03/11/2020   Lab Results  Component Value Date   INSULIN 16.6 12/18/2021   INSULIN 18.9  06/11/2021   INSULIN 21.0 07/30/2020   INSULIN 17.3 03/11/2020   INSULIN 13.1 09/01/2019   Lab Results  Component Value Date   TSH 2.310 12/13/2020   Lab Results  Component Value Date   CHOL 189 12/18/2021   HDL 45 12/18/2021   LDLCALC 120 (H) 12/18/2021   TRIG 135 12/18/2021   CHOLHDL 3.7 12/13/2020   Lab Results  Component Value Date   VD25OH 47.5 12/18/2021   VD25OH 51.4 06/11/2021   VD25OH 78.7 01/13/2021   Lab Results  Component Value Date   WBC 10.9 (H) 12/18/2021   HGB 9.9 (L) 12/18/2021   HCT 32.9 (L) 12/18/2021   MCV 67 (L) 12/18/2021   PLT 501 (H) 12/18/2021   Lab Results  Component Value Date   IRON 25 (L) 09/01/2019   TIBC 367 09/01/2019   FERRITIN 13 (L) 09/01/2019    Attestation Statements:   Reviewed by clinician on day of visit: allergies, medications, problem list, medical history, surgical history, family history, social history, and previous encounter notes.  I, Delrae Rend  White, RMA, am acting as Location manager for Everardo Pacific, FNP.  I have reviewed the above documentation for accuracy and completeness, and I agree with the above. Everardo Pacific, FNP

## 2022-04-22 ENCOUNTER — Encounter (INDEPENDENT_AMBULATORY_CARE_PROVIDER_SITE_OTHER): Payer: Self-pay | Admitting: Nurse Practitioner

## 2022-04-22 ENCOUNTER — Ambulatory Visit (INDEPENDENT_AMBULATORY_CARE_PROVIDER_SITE_OTHER): Payer: No Typology Code available for payment source | Admitting: Nurse Practitioner

## 2022-04-22 VITALS — BP 110/77 | HR 77 | Temp 97.3°F | Ht 62.0 in | Wt 235.0 lb

## 2022-04-22 DIAGNOSIS — I10 Essential (primary) hypertension: Secondary | ICD-10-CM | POA: Diagnosis not present

## 2022-04-22 DIAGNOSIS — E669 Obesity, unspecified: Secondary | ICD-10-CM

## 2022-04-22 DIAGNOSIS — R7303 Prediabetes: Secondary | ICD-10-CM

## 2022-04-22 DIAGNOSIS — D508 Other iron deficiency anemias: Secondary | ICD-10-CM | POA: Diagnosis not present

## 2022-04-22 DIAGNOSIS — Z6841 Body Mass Index (BMI) 40.0 and over, adult: Secondary | ICD-10-CM

## 2022-04-22 DIAGNOSIS — Z7984 Long term (current) use of oral hypoglycemic drugs: Secondary | ICD-10-CM

## 2022-04-22 DIAGNOSIS — F3289 Other specified depressive episodes: Secondary | ICD-10-CM

## 2022-04-22 NOTE — Telephone Encounter (Signed)
Stephanie

## 2022-04-23 MED ORDER — NALTREXONE HCL 50 MG PO TABS
ORAL_TABLET | ORAL | 0 refills | Status: DC
Start: 2022-04-23 — End: 2022-05-20

## 2022-04-23 MED ORDER — METFORMIN HCL 500 MG PO TABS: 500.0000 mg | ORAL_TABLET | Freq: Three times a day (TID) | ORAL | 0 refills | Status: DC

## 2022-04-23 MED ORDER — TOPIRAMATE 100 MG PO TABS: 100.0000 mg | ORAL_TABLET | Freq: Every day | ORAL | 0 refills | Status: DC

## 2022-04-23 MED ORDER — FERROUS SULFATE 325 (65 FE) MG PO TABS: 325.0000 mg | ORAL_TABLET | Freq: Every day | ORAL | 0 refills | Status: DC

## 2022-04-23 MED ORDER — BUPROPION HCL ER (SR) 200 MG PO TB12: 200.0000 mg | ORAL_TABLET | Freq: Two times a day (BID) | ORAL | 0 refills | Status: DC

## 2022-04-23 MED ORDER — LISINOPRIL-HYDROCHLOROTHIAZIDE 10-12.5 MG PO TABS: 1.0000 | ORAL_TABLET | Freq: Every day | ORAL | 0 refills | Status: DC

## 2022-04-27 NOTE — Progress Notes (Signed)
Chief Complaint:   OBESITY Heidi Robinson is here to discuss her progress with her obesity treatment plan along with follow-up of her obesity related diagnoses. [name] is on the Category 2 Plan and states she is following her eating plan approximately 0% of the time. [name] states she is exercising 0 minutes 0 times per week.  Today's visit was #: 57 Starting weight: 258 lbs Starting date: 12/21/2017 Today's weight: 235 lbs Today's date: 04/22/2022 Total lbs lost to date: 23 lbs Total lbs lost since last in-office visit: 2  Interim History: Heidi Robinson has done well with weight loss since her last visit. She has celebrated her son's graduation since last visit. She is not following a Cat plan.  She is watching her portion sizes and is trying to make healthier choices. She has a family reunion coming up in July.  Subjective:   1. Pre-diabetes Heidi Robinson is currently taking Metformin 500 mg three times a day. Denies any side effects. Report polyphagia.   2. Essential hypertension Heidi Robinson's blood pressure looks good today. She is currently taking Zestoretic 10-12.5 mg daily. Denies any chest pain,shortness of breath or palpitations.  3. Other iron deficiency anemia Heidi Robinson has Sickle cell traits. She is taking iron every other day due to side effect of constipation.   4. Other depression with emotional eating Heidi Robinson is currently taking Topamax daily. Denies any side effects. She reports cravings.   Assessment/Plan:   1. Pre-diabetes We will Refill Metformin 500 mg TID, for 1 month with 0 refills. Side effects discussed.   -Refill metFORMIN (GLUCOPHAGE) 500 MG tablet; Take 1 tablet (500 mg total) by mouth 3 (three) times daily.  Dispense: 90 tablet; Refill: 0  Tola will continue to work on weight loss, exercise, and decreasing simple carbohydrates to help decrease the risk of diabetes.    2. Essential hypertension We will Refill Zestoretic 10-12.5 mg by mouth daily for 1 month with 0 refills. Side effects  discussed.   Refill lisinopril-hydrochlorothiazide (ZESTORETIC) 10-12.5 MG tablet; Take 1 tablet by mouth daily. Take 1 tablet by mouth daily.  Dispense: 30 tablet; Refill: 0  Shannan is working on healthy weight loss and exercise to improve blood pressure control. We will watch for signs of hypotension as she continues her lifestyle modifications.   3. Other iron deficiency anemia We will Refill Iron 325 mg daily for 1 month with 0 refills. Side effects discussed. Niyla will need a colonoscopy soon. EDG 2011.  Refer to hematology.   -Refill ferrous sulfate 325 (65 FE) MG tablet; Take 1 tablet (325 mg total) by mouth daily with breakfast.  Dispense: 30 tablet; Refill: 0  4. Other depression with emotional eating We will Refill Wellbutrin SR 200 mg and Topamax 100 mg daily for 1 month with 0 refills.  -Refill buPROPion (WELLBUTRIN SR) 200 MG 12 hr tablet; Take 1 tablet (200 mg total) by mouth 2 (two) times daily.  Dispense: 60 tablet; Refill: 0  -Refill topiramate (TOPAMAX) 100 MG tablet; Take 1 tablet (100 mg total) by mouth at bedtime.  Dispense: 30 tablet; Refill: 0  5. Obesity with current BMI 43.1 Heidi Robinson will Start Naltrexone 50 mg 1/2 tablet dinnertime daily for 1 month with 0 refills. Side effects discussed.  Discussed obtaining labs in the next 1-2 months.   -Start  naltrexone (DEPADE) 50 MG tablet; Take 1/2 tablet dinnertime  Dispense: 15 tablet; Refill: 0  Heidi Robinson is currently in the action stage of change. As such, her goal is to continue  with weight loss efforts. She has agreed to practicing portion control and making smarter food choices, such as increasing vegetables and decreasing simple carbohydrates.   Exercise goals: All adults should avoid inactivity. Some physical activity is better than none, and adults who participate in any amount of physical activity gain some health benefits.  Behavioral modification strategies: increasing water intake, no skipping meals, and planning for  success.  Heidi Robinson has agreed to follow-up with our clinic in 4 weeks. She was informed of the importance of frequent follow-up visits to maximize her success with intensive lifestyle modifications for her multiple health conditions.   Objective:   Blood pressure 110/77, pulse 77, temperature (!) 97.3 F (36.3 C), height '5\' 2"'$  (1.575 m), weight 235 lb (106.6 kg), SpO2 99 %. Body mass index is 42.98 kg/m.  General: Cooperative, alert, well developed, in no acute distress. HEENT: Conjunctivae and lids unremarkable. Cardiovascular: Regular rhythm.  Lungs: Normal work of breathing. Neurologic: No focal deficits.   Lab Results  Component Value Date   CREATININE 0.77 12/18/2021   BUN 10 12/18/2021   NA 141 12/18/2021   K 4.3 12/18/2021   CL 101 12/18/2021   CO2 23 12/18/2021   Lab Results  Component Value Date   ALT 10 12/18/2021   AST 11 12/18/2021   ALKPHOS 74 12/18/2021   BILITOT <0.2 12/18/2021   Lab Results  Component Value Date   HGBA1C 5.4 12/18/2021   HGBA1C 4.9 06/11/2021   HGBA1C 4.8 12/13/2020   HGBA1C 5.2 07/30/2020   HGBA1C 5.0 03/11/2020   Lab Results  Component Value Date   INSULIN 16.6 12/18/2021   INSULIN 18.9 06/11/2021   INSULIN 21.0 07/30/2020   INSULIN 17.3 03/11/2020   INSULIN 13.1 09/01/2019   Lab Results  Component Value Date   TSH 2.310 12/13/2020   Lab Results  Component Value Date   CHOL 189 12/18/2021   HDL 45 12/18/2021   LDLCALC 120 (H) 12/18/2021   TRIG 135 12/18/2021   CHOLHDL 3.7 12/13/2020   Lab Results  Component Value Date   VD25OH 47.5 12/18/2021   VD25OH 51.4 06/11/2021   VD25OH 78.7 01/13/2021   Lab Results  Component Value Date   WBC 10.9 (H) 12/18/2021   HGB 9.9 (L) 12/18/2021   HCT 32.9 (L) 12/18/2021   MCV 67 (L) 12/18/2021   PLT 501 (H) 12/18/2021   Lab Results  Component Value Date   IRON 25 (L) 09/01/2019   TIBC 367 09/01/2019   FERRITIN 13 (L) 09/01/2019   Attestation Statements:   Reviewed by  clinician on day of visit: allergies, medications, problem list, medical history, surgical history, family history, social history, and previous encounter notes.  I, Brendell Tyus, RMA, am acting as transcriptionist for Everardo Pacific, FNP..  I have reviewed the above documentation for accuracy and completeness, and I agree with the above. Everardo Pacific, FNP

## 2022-05-15 ENCOUNTER — Other Ambulatory Visit: Payer: Self-pay | Admitting: *Deleted

## 2022-05-15 DIAGNOSIS — D500 Iron deficiency anemia secondary to blood loss (chronic): Secondary | ICD-10-CM

## 2022-05-15 NOTE — Progress Notes (Signed)
Lab orders placed for appointment on 7/10 for IDA

## 2022-05-18 ENCOUNTER — Encounter: Payer: Self-pay | Admitting: Nurse Practitioner

## 2022-05-18 ENCOUNTER — Inpatient Hospital Stay: Payer: No Typology Code available for payment source | Attending: Oncology

## 2022-05-18 ENCOUNTER — Inpatient Hospital Stay (HOSPITAL_BASED_OUTPATIENT_CLINIC_OR_DEPARTMENT_OTHER): Payer: No Typology Code available for payment source | Admitting: Nurse Practitioner

## 2022-05-18 ENCOUNTER — Other Ambulatory Visit (HOSPITAL_BASED_OUTPATIENT_CLINIC_OR_DEPARTMENT_OTHER): Payer: Self-pay

## 2022-05-18 VITALS — BP 131/92 | HR 100 | Temp 98.2°F | Resp 28 | Ht 62.0 in | Wt 240.0 lb

## 2022-05-18 DIAGNOSIS — D500 Iron deficiency anemia secondary to blood loss (chronic): Secondary | ICD-10-CM

## 2022-05-18 DIAGNOSIS — N920 Excessive and frequent menstruation with regular cycle: Secondary | ICD-10-CM | POA: Insufficient documentation

## 2022-05-18 DIAGNOSIS — R7303 Prediabetes: Secondary | ICD-10-CM | POA: Diagnosis not present

## 2022-05-18 DIAGNOSIS — D573 Sickle-cell trait: Secondary | ICD-10-CM | POA: Diagnosis not present

## 2022-05-18 DIAGNOSIS — I10 Essential (primary) hypertension: Secondary | ICD-10-CM | POA: Diagnosis not present

## 2022-05-18 DIAGNOSIS — J45909 Unspecified asthma, uncomplicated: Secondary | ICD-10-CM | POA: Insufficient documentation

## 2022-05-18 LAB — URINALYSIS, COMPLETE (UACMP) WITH MICROSCOPIC
Bilirubin Urine: NEGATIVE
Glucose, UA: NEGATIVE mg/dL
Hgb urine dipstick: NEGATIVE
Ketones, ur: NEGATIVE mg/dL
Nitrite: NEGATIVE
Protein, ur: NEGATIVE mg/dL
Specific Gravity, Urine: 1.015 (ref 1.005–1.030)
pH: 6.5 (ref 5.0–8.0)

## 2022-05-18 LAB — CBC WITH DIFFERENTIAL (CANCER CENTER ONLY)
Abs Immature Granulocytes: 0.08 10*3/uL — ABNORMAL HIGH (ref 0.00–0.07)
Basophils Absolute: 0 10*3/uL (ref 0.0–0.1)
Basophils Relative: 0 %
Eosinophils Absolute: 0.2 10*3/uL (ref 0.0–0.5)
Eosinophils Relative: 2 %
HCT: 30.3 % — ABNORMAL LOW (ref 36.0–46.0)
Hemoglobin: 9.1 g/dL — ABNORMAL LOW (ref 12.0–15.0)
Immature Granulocytes: 1 %
Lymphocytes Relative: 15 %
Lymphs Abs: 1.7 10*3/uL (ref 0.7–4.0)
MCH: 19.1 pg — ABNORMAL LOW (ref 26.0–34.0)
MCHC: 30 g/dL (ref 30.0–36.0)
MCV: 63.7 fL — ABNORMAL LOW (ref 80.0–100.0)
Monocytes Absolute: 0.6 10*3/uL (ref 0.1–1.0)
Monocytes Relative: 5 %
Neutro Abs: 9.1 10*3/uL — ABNORMAL HIGH (ref 1.7–7.7)
Neutrophils Relative %: 77 %
Platelet Count: 476 10*3/uL — ABNORMAL HIGH (ref 150–400)
RBC: 4.76 MIL/uL (ref 3.87–5.11)
RDW: 24.8 % — ABNORMAL HIGH (ref 11.5–15.5)
Smear Review: INCREASED
WBC Count: 11.7 10*3/uL — ABNORMAL HIGH (ref 4.0–10.5)
nRBC: 0 % (ref 0.0–0.2)

## 2022-05-18 LAB — SAVE SMEAR(SSMR), FOR PROVIDER SLIDE REVIEW

## 2022-05-18 LAB — FERRITIN: Ferritin: 12 ng/mL (ref 11–307)

## 2022-05-18 NOTE — Progress Notes (Signed)
New Hematology/Oncology Consult   Requesting MD: Everardo Pacific, [place]      Reason for Consult: Iron deficiency anemia  HPI: Heidi Robinson is a 46 year old woman referred for evaluation of iron deficiency anemia.  She was seen by Everardo Pacific, NP on 04/22/2022 to follow-up on her progress with her obesity treatment plan.  Office note from that visit indicates she takes oral iron 325 mg daily.  There are no labs from that office visit.  Most recent CBC is from 12/18/2021-hemoglobin 9.9, MCV 67, white count 10.9, platelet count 501,000, RDW 18.9.  Most recent iron studies from 09/01/2019-iron 25, TIBC 367, UIBC 342, percent saturation 7, ferritin 13.  Review of CBCs in the EMR show chronic microcytic anemia dating to 05/19/2013-hemoglobin 11.7, MCV 60, RDW 17.  She reports only known bleeding is the monthly menstrual cycle lasting 7 days, heavy for 4 of those days, passes clots.  She currently takes ferrous sulfate 325 mg daily with mild intermittent constipation.  Takes MiraLAX as needed.  No blood with bowel movements.  No recent change in bowel habits.  No hematuria.  She does not crave ice.  No tongue or lip soreness.  Nails brittle at times.  No hair loss.  She eats a regular diet.  Energy level is low.  No shortness of breath except with an "asthma attack".   Past Medical History:  Diagnosis Date   Allergy    Anemia    Arthritis    Asthma    Fibroids    Hypertension    Iron deficiency anemia due to chronic blood loss    Obesity (BMI 30-39.9) 02/02/2012   Sickle cell trait ([place]) 06/04/2014   hemoglobin electrophoresis confirmed     Past Surgical History:  Procedure Laterality Date   BREAST REDUCTION SURGERY Bilateral 11/03/2018   Procedure: BILATERAL BREAST REDUCTION WITH LIPOSUCTION;  Surgeon: Wallace Going, DO;  Location: [facility];  Service: Plastics;  Laterality: Bilateral;   REDUCTION MAMMAPLASTY Bilateral 2019   tonsillecomy      TONSILLECTOMY  1997     Current Outpatient Medications:    albuterol (VENTOLIN HFA) 108 (90 Base) MCG/ACT inhaler, Inhale 2 puffs into the lungs every 6 (six) hours as needed for wheezing or shortness of breath., Disp: 18 g, Rfl: 3   buPROPion (WELLBUTRIN SR) 200 MG 12 hr tablet, Take 1 tablet (200 mg total) by mouth 2 (two) times daily., Disp: 60 tablet, Rfl: 0   diclofenac (VOLTAREN) 50 MG EC tablet, Take 50 mg by mouth 2 (two) times daily as needed., Disp: , Rfl:    ferrous sulfate 325 (65 FE) MG tablet, Take 1 tablet (325 mg total) by mouth daily with breakfast., Disp: 30 tablet, Rfl: 0   lisinopril-hydrochlorothiazide (ZESTORETIC) 10-12.5 MG tablet, Take 1 tablet by mouth daily. Take 1 tablet by mouth daily., Disp: 30 tablet, Rfl: 0   metFORMIN (GLUCOPHAGE) 500 MG tablet, Take 1 tablet (500 mg total) by mouth 3 (three) times daily., Disp: 90 tablet, Rfl: 0   Multiple Vitamin (MULTIVITAMINS PO), Take by mouth., Disp: , Rfl:    naltrexone (DEPADE) 50 MG tablet, Take 1/2 tablet dinnertime, Disp: 15 tablet, Rfl: 0   polyethylene glycol powder (GLYCOLAX/MIRALAX) 17 GM/SCOOP powder, Take 17 g by mouth daily., Disp: 3350 g, Rfl: 0   topiramate (TOPAMAX) 100 MG tablet, Take 1 tablet (100 mg total) by mouth at bedtime., Disp: 30 tablet, Rfl: 0   meloxicam (MOBIC) 15 MG tablet, Take 1 tablet (  15 mg total) by mouth daily as needed for pain. (Patient not taking: Reported on 05/18/2022), Disp: 30 tablet, Rfl: 5:     Allergies  Allergen Reactions   Penicillins Anaphylaxis   Food     Fish, Walnuts, Cashews, Almonds, Pecans, Blueberries, Pine nuts, Pistachios, Macadamia Nuts, Coconuts, Chestnuts   Latex     "Rash, hives maybe... I don't really remember"    FH: No family history of anemia.  Maternal grandmother bladder cancer.  Paternal grandfather prostate cancer.  Maternal great grandmother leukemia.  SOCIAL HISTORY: [place]  She is married.  She has 2 children ages 81 and 92 both  in good health.  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  No tobacco or alcohol use.  Review of Systems: Per HPI  Physical Exam:  Blood pressure (!) 131/92, pulse 100, temperature 98.2 F (36.8 C), temperature source Oral, resp. rate (!) 28, height '5\' 2"'$  (1.575 m), weight 240 lb (108.9 kg), SpO2 100 %.  HEENT: No thrush or ulcers. Lungs: Lungs clear bilaterally. Cardiac: Regular rate and rhythm. Abdomen: No hepatosplenomegaly. Vascular: No leg edema. Lymph nodes: No palpable cervical, supraclavicular, axillary or inguinal lymph nodes. Skin: No rash.  LABS:   Recent Labs    05/18/22 1348  WBC 11.7*  HGB 9.1*  HCT 30.3*  PLT 476*  Peripheral blood smear-mild increase in polychromasia, red cells appear microcytic, microcytes, hypochromic red cells, teardrops, ovalocytes, cigar cells, few targets; white blood cell morphology unremarkable; platelets appear normal in number.  No results for input(s): "NA", "K", "CL", "CO2", "GLUCOSE", "BUN", "CREATININE", "CALCIUM" in the last 72 hours.    RADIOLOGY:  No results found.  Assessment and Plan:   Chronic microcytic anemia Iron deficiency Menorrhagia Sickle cell trait Hypertension Prediabetes Asthma  Heidi Robinson was referred for evaluation of iron deficiency anemia likely due to menorrhagia.  The red cell indices and peripheral blood smear are consistent with iron deficiency.  She will increase oral iron to twice daily with the plan for IV iron if the hemoglobin does not improve or she is unable to tolerate oral iron.  She will follow-up with gynecology regarding the menorrhagia.  She has a history of a mild microcytic anemia dating to at least 2014.  She may have thalassemia.  We have ordered alpha thalassemia gene testing.  She will return for a CBC and follow-up visit in 6 weeks.  She will contact the office in the interim with any problems.  Patient seen with Dr. Benay Spice.  Ned Card, NP 05/18/2022, 3:14 PM    This was a shared visit with Ned Card.  Heidi Robinson was interviewed and examined.  I reviewed the peripheral blood smear.  She is referred for evaluation of microcytic anemia.  The hematologic indices and peripheral blood smear are consistent with a diagnosis of iron deficiency anemia.  This is likely due to menorrhagia.  We will attempt to correct the anemia with oral iron therapy.  The mild thrombocytosis and neutrophilia are likely related to the iron deficiency.  She has sickle cell trait.  Hemoglobin electrophoresis in 2015 found a higher A:S than expected with sickle cell trait.  She may have coexisting thalassemia.  I was present for greater than 50% of today's visit.  I performed medical decision making.  Julieanne Manson, MD

## 2022-05-19 ENCOUNTER — Telehealth: Payer: Self-pay

## 2022-05-19 ENCOUNTER — Telehealth: Payer: Self-pay | Admitting: Nurse Practitioner

## 2022-05-19 ENCOUNTER — Other Ambulatory Visit: Payer: Self-pay | Admitting: *Deleted

## 2022-05-19 DIAGNOSIS — D500 Iron deficiency anemia secondary to blood loss (chronic): Secondary | ICD-10-CM

## 2022-05-19 NOTE — Telephone Encounter (Signed)
Faxed to [place], NP [facility]

## 2022-05-19 NOTE — Telephone Encounter (Signed)
Attempted to contact patient in regards to scheduling follow up from LOS 7/10. No answer so voicemail was left for patient to call back

## 2022-05-19 NOTE — Telephone Encounter (Signed)
-----   Message from Owens Shark, NP sent at 05/18/2022  4:10 PM EDT ----- Please forward the urinalysis result from today to Everardo Pacific, NP  [facility].

## 2022-05-20 ENCOUNTER — Ambulatory Visit (INDEPENDENT_AMBULATORY_CARE_PROVIDER_SITE_OTHER): Payer: No Typology Code available for payment source | Admitting: Nurse Practitioner

## 2022-05-20 VITALS — BP 110/75 | HR 90 | Temp 97.8°F | Ht 62.0 in | Wt 235.0 lb

## 2022-05-20 DIAGNOSIS — E66813 Obesity, class 3: Secondary | ICD-10-CM

## 2022-05-20 DIAGNOSIS — I10 Essential (primary) hypertension: Secondary | ICD-10-CM

## 2022-05-20 DIAGNOSIS — F3289 Other specified depressive episodes: Secondary | ICD-10-CM | POA: Diagnosis not present

## 2022-05-20 DIAGNOSIS — R7303 Prediabetes: Secondary | ICD-10-CM | POA: Diagnosis not present

## 2022-05-20 DIAGNOSIS — D508 Other iron deficiency anemias: Secondary | ICD-10-CM | POA: Diagnosis not present

## 2022-05-20 DIAGNOSIS — Z6841 Body Mass Index (BMI) 40.0 and over, adult: Secondary | ICD-10-CM

## 2022-05-20 DIAGNOSIS — E669 Obesity, unspecified: Secondary | ICD-10-CM

## 2022-05-20 MED ORDER — LISINOPRIL-HYDROCHLOROTHIAZIDE 10-12.5 MG PO TABS: 1.0000 | ORAL_TABLET | Freq: Every day | ORAL | 0 refills | Status: DC

## 2022-05-20 MED ORDER — METFORMIN HCL 500 MG PO TABS: 500.0000 mg | ORAL_TABLET | Freq: Three times a day (TID) | ORAL | 0 refills | Status: DC

## 2022-05-20 MED ORDER — POLYETHYLENE GLYCOL 3350 17 GM/SCOOP PO POWD: 17.0000 g | Freq: Every day | ORAL | 0 refills | Status: DC

## 2022-05-20 MED ORDER — TOPIRAMATE 100 MG PO TABS: 100.0000 mg | ORAL_TABLET | Freq: Every day | ORAL | 0 refills | Status: DC

## 2022-05-20 MED ORDER — BUPROPION HCL ER (SR) 200 MG PO TB12: 200.0000 mg | ORAL_TABLET | Freq: Two times a day (BID) | ORAL | 0 refills | Status: DC

## 2022-05-20 MED ORDER — NALTREXONE HCL 50 MG PO TABS: ORAL_TABLET | ORAL | 0 refills | Status: DC

## 2022-05-21 NOTE — Progress Notes (Signed)
Chief Complaint:   OBESITY Heidi Robinson is here to discuss her progress with her obesity treatment plan along with follow-up of her obesity related diagnoses. Heidi Robinson is on the Category 2 Plan and states she is following her eating plan approximately 0% of the time. Heidi Robinson states she is exercising 0 minutes 0 times per week.  Today's visit was #: 10 Starting weight: 258 lbs Starting date: 12/21/2017 Today's weight: 235 lbs Today's date: 05/20/2022 Total lbs lost to date: 23 lbs Total lbs lost since last in-office visit: 0  Interim History: Heidi Robinson has maintained her weight since her last visit. I prescribed Naltrexone 50 mg, 1/2 tablet at dinnertime but she stopped after 4 days due to nausea and insomnia. She has been eating whatever and has not been following anything. She celebrated family reunion since her last visit. Denies cravings, drinking water, soda, and sweet tea.    Subjective:   1. Other iron deficiency anemia [name] saw hematology last on 05/18/22. Iron was increased to twice a day. Notes, some side effects of constipation.  2. Pre-diabetes Heidi Robinson is currently taking Metformin 500 mg three times a day. Denies any side effects.  3. Essential hypertension Heidi Robinson is currently taking Zestoretic 10-12.5 mg daily. Denies any side effects.Denies any chest pain,shortness of breath or palpitations.  4. Other depression with emotional eating Heidi Robinson is currently taking Topamax 100 mg daily and Wellbutrin SR 200 mg twice a day. Denies any side effects. Denies cravings.   Assessment/Plan:   1. Other iron deficiency anemia Continue to follow up with hematology. Refill Miralax 17g by mouth daily PRN for constipation.  Side effects discussed.   -Refill polyethylene glycol powder (GLYCOLAX/MIRALAX) 17 GM/SCOOP powder; Take 17 g by mouth daily.  Dispense: 3350 g; Refill: 0  2. Pre-diabetes We will refill Metformin 500 mg by mouth 3 times a day for 1 month with 0 refills. Side effects discussed.   Heidi Robinson  will continue to work on weight loss, exercise, and decreasing simple carbohydrates to help decrease the risk of diabetes.    -Refill metFORMIN (GLUCOPHAGE) 500 MG tablet; Take 1 tablet (500 mg total) by mouth 3 (three) times daily.  Dispense: 90 tablet; Refill: 0  3. Essential hypertension We will refill Zestoretic 10-12.5 mg by mouth daily for 1 month with 0 refills. Side effects discussed.   Heidi Robinson is working on healthy weight loss and exercise to improve blood pressure control. We will watch for signs of hypotension as she continues her lifestyle modifications.   -Refill lisinopril-hydrochlorothiazide (ZESTORETIC) 10-12.5 MG tablet; Take 1 tablet by mouth daily. Take 1 tablet by mouth daily.  Dispense: 30 tablet; Refill: 0  4. Other depression with emotional eating We will refill Topamax 100 mg by mouth daily AND Wellbutrin SR 200 mg by mouth twice a day for 1 month with 0 refills. Side effects discussed.   -Refill topiramate (TOPAMAX) 100 MG tablet; Take 1 tablet (100 mg total) by mouth at bedtime.  Dispense: 30 tablet; Refill: 0  -Refill buPROPion (WELLBUTRIN SR) 200 MG 12 hr tablet; Take 1 tablet (200 mg total) by mouth 2 (two) times daily.  Dispense: 60 tablet; Refill: 0  5. Obesity with current BMI 43.1 We will refill Naltrexone 50 mg, 1/2 tab by mouth at lunch or dinner, for 1 month with 0 refills.  Side effects discussed.   Refill naltrexone (DEPADE) 50 MG tablet; Take 1/2 tablet dinnertime  Dispense: 15 tablet; Refill: 0  Heidi Robinson is currently in the action stage of  change. As such, her goal is to continue with weight loss efforts. She has agreed to the Category 2 Plan.   Will obtain IC at next visit.  Exercise goals: All adults should avoid inactivity. Some physical activity is better than none, and adults who participate in any amount of physical activity gain some health benefits.  Behavioral modification strategies: increasing lean protein intake, increasing water intake,  planning for success, and keeping a strict food journal.  Heidi Robinson has agreed to follow-up with our clinic in 4 weeks. She was informed of the importance of frequent follow-up visits to maximize her success with intensive lifestyle modifications for her multiple health conditions.   Objective:   Blood pressure 110/75, pulse 90, temperature 97.8 F (36.6 C), height '5\' 2"'$  (1.575 m), weight 235 lb (106.6 kg), SpO2 100 %. Body mass index is 42.98 kg/m.  General: Cooperative, alert, well developed, in no acute distress. HEENT: Conjunctivae and lids unremarkable. Cardiovascular: Regular rhythm.  Lungs: Normal work of breathing. Neurologic: No focal deficits.   Lab Results  Component Value Date   CREATININE 0.77 12/18/2021   BUN 10 12/18/2021   NA 141 12/18/2021   K 4.3 12/18/2021   CL 101 12/18/2021   CO2 23 12/18/2021   Lab Results  Component Value Date   ALT 10 12/18/2021   AST 11 12/18/2021   ALKPHOS 74 12/18/2021   BILITOT <0.2 12/18/2021   Lab Results  Component Value Date   HGBA1C 5.4 12/18/2021   HGBA1C 4.9 06/11/2021   HGBA1C 4.8 12/13/2020   HGBA1C 5.2 07/30/2020   HGBA1C 5.0 03/11/2020   Lab Results  Component Value Date   INSULIN 16.6 12/18/2021   INSULIN 18.9 06/11/2021   INSULIN 21.0 07/30/2020   INSULIN 17.3 03/11/2020   INSULIN 13.1 09/01/2019   Lab Results  Component Value Date   TSH 2.310 12/13/2020   Lab Results  Component Value Date   CHOL 189 12/18/2021   HDL 45 12/18/2021   LDLCALC 120 (H) 12/18/2021   TRIG 135 12/18/2021   CHOLHDL 3.7 12/13/2020   Lab Results  Component Value Date   VD25OH 47.5 12/18/2021   VD25OH 51.4 06/11/2021   VD25OH 78.7 01/13/2021   Lab Results  Component Value Date   WBC 11.7 (H) 05/18/2022   HGB 9.1 (L) 05/18/2022   HCT 30.3 (L) 05/18/2022   MCV 63.7 (L) 05/18/2022   PLT 476 (H) 05/18/2022   Lab Results  Component Value Date   IRON 25 (L) 09/01/2019   TIBC 367 09/01/2019   FERRITIN 12 05/18/2022    Attestation Statements:   Reviewed by clinician on day of visit: allergies, medications, problem list, medical history, surgical history, family history, social history, and previous encounter notes.  I, Brendell Tyus, RMA, am acting as transcriptionist for Everardo Pacific, FNP.  I have reviewed the above documentation for accuracy and completeness, and I agree with the above. Everardo Pacific, FNP

## 2022-05-28 LAB — ALPHA-THALASSEMIA GENOTYPR

## 2022-06-17 ENCOUNTER — Encounter (INDEPENDENT_AMBULATORY_CARE_PROVIDER_SITE_OTHER): Payer: Self-pay

## 2022-06-22 ENCOUNTER — Encounter (INDEPENDENT_AMBULATORY_CARE_PROVIDER_SITE_OTHER): Payer: Self-pay | Admitting: Nurse Practitioner

## 2022-06-22 ENCOUNTER — Ambulatory Visit (INDEPENDENT_AMBULATORY_CARE_PROVIDER_SITE_OTHER): Payer: No Typology Code available for payment source | Admitting: Nurse Practitioner

## 2022-06-22 VITALS — BP 114/66 | HR 100 | Temp 98.2°F | Ht 62.0 in | Wt 235.0 lb

## 2022-06-22 DIAGNOSIS — R7303 Prediabetes: Secondary | ICD-10-CM | POA: Diagnosis not present

## 2022-06-22 DIAGNOSIS — R0602 Shortness of breath: Secondary | ICD-10-CM | POA: Diagnosis not present

## 2022-06-22 DIAGNOSIS — F3289 Other specified depressive episodes: Secondary | ICD-10-CM

## 2022-06-22 DIAGNOSIS — D508 Other iron deficiency anemias: Secondary | ICD-10-CM

## 2022-06-22 DIAGNOSIS — E669 Obesity, unspecified: Secondary | ICD-10-CM

## 2022-06-22 DIAGNOSIS — E7800 Pure hypercholesterolemia, unspecified: Secondary | ICD-10-CM

## 2022-06-22 DIAGNOSIS — I10 Essential (primary) hypertension: Secondary | ICD-10-CM

## 2022-06-22 DIAGNOSIS — Z6841 Body Mass Index (BMI) 40.0 and over, adult: Secondary | ICD-10-CM

## 2022-06-22 MED ORDER — BUPROPION HCL ER (SR) 200 MG PO TB12: 200.0000 mg | ORAL_TABLET | Freq: Two times a day (BID) | ORAL | 0 refills | Status: DC

## 2022-06-22 MED ORDER — FERROUS SULFATE 325 (65 FE) MG PO TABS
325.0000 mg | ORAL_TABLET | Freq: Every day | ORAL | 0 refills | Status: DC
Start: 2022-06-22 — End: 2022-07-20

## 2022-06-22 MED ORDER — POLYETHYLENE GLYCOL 3350 17 GM/SCOOP PO POWD: 17.0000 g | Freq: Every day | ORAL | 0 refills | Status: DC

## 2022-06-22 MED ORDER — TOPIRAMATE 100 MG PO TABS: 100.0000 mg | ORAL_TABLET | Freq: Every day | ORAL | 0 refills | Status: DC

## 2022-06-22 MED ORDER — METFORMIN HCL 500 MG PO TABS: 500.0000 mg | ORAL_TABLET | Freq: Three times a day (TID) | ORAL | 0 refills | Status: DC

## 2022-06-22 MED ORDER — LISINOPRIL-HYDROCHLOROTHIAZIDE 10-12.5 MG PO TABS: 1.0000 | ORAL_TABLET | Freq: Every day | ORAL | 0 refills | Status: DC

## 2022-06-29 ENCOUNTER — Telehealth: Payer: Self-pay | Admitting: Oncology

## 2022-06-29 ENCOUNTER — Inpatient Hospital Stay: Payer: No Typology Code available for payment source

## 2022-06-29 ENCOUNTER — Inpatient Hospital Stay: Payer: No Typology Code available for payment source | Admitting: Nurse Practitioner

## 2022-06-29 NOTE — Progress Notes (Unsigned)
Chief Complaint:   OBESITY Heidi Robinson is here to discuss her progress with her obesity treatment plan along with follow-up of her obesity related diagnoses. Heidi Robinson is on the Category 2 Plan and states she is following her eating plan approximately 0% of the time. Heidi Robinson states she is exercising 0 minutes 0 times per week.  Today's visit was #: 58 Starting weight: 258 lbs Starting date: 12/21/2017 Today's weight: 235 lbs Today's date: 06/22/2022 Total lbs lost to date: 23 lbs Total lbs lost since last in-office visit: 0   Interim History: Heidi Robinson is taking Naltrexone 50 mg, 1/2 tab at breakfast. Denies any side effects. She has maintained her weight since last visit. Her focus has been on her son and getting him to college. She has not been following meal plan. Now that her son is at school she can focus on herself. Notes cravings during menses.  Subjective:   1. Essential hypertension Heidi Robinson is currently taking Lisinopril-HCT 10-12.5 mg. Denies any chest pain,shortness of breath or palpitations.  2. Other iron deficiency anemia Heidi Robinson is currently taking Iron 325 mg. Denies any side effects.  3. Pure hypercholesterolemia Heidi Robinson has never been on medications. Family history father.  4. Pre-diabetes Heidi Robinson is currently taking Metformin 500 mg three times a day. Denies side effects   5. Shortness of breath on exertion Heidi Robinson notes shortness of breath with exertion. IC obtained today. IC on 12/21/17 of 1622. IC today of 1973.  6. Other depression with emotional eating Heidi Robinson is currently taking Topamax 100 mg and Wellbutrin SR 200 mg twice a day. Denies any side effects.  Assessment/Plan:   1. Essential hypertension We will obtain labs today. We will refill Lisinopril-HCT 10-12.5 mg daily for 1 month with 0 refills. Side effects discussed.   Heidi Robinson is working on healthy weight loss and exercise to improve blood pressure control. We will watch for signs of hypotension as she continues her lifestyle  modifications.   Review: taking medications as instructed, no medication side effects noted, no chest pain on exertion, no dyspnea on exertion, no swelling of ankles.   BP Readings from Last 3 Encounters:  06/22/22 114/66  05/20/22 110/75  05/18/22 (!) 131/92    - Comprehensive metabolic panel  -Refill lisinopril-hydrochlorothiazide (ZESTORETIC) 10-12.5 MG tablet; Take 1 tablet by mouth daily. Take 1 tablet by mouth daily.  Dispense: 30 tablet; Refill: 0  2. Other iron deficiency anemia We will obtain labs today. We will refill Iron 325 mg daily and Glycolax/Miralax 17 g daily for 1 month with 0 refills. Side effects discussed.   - CBC with Differential/Platelet - Comprehensive metabolic panel - Ferritin - Hemoglobin A1c - Insulin, random - Iron  -Refill ferrous sulfate 325 (65 FE) MG tablet; Take 1 tablet (325 mg total) by mouth daily with breakfast.  Dispense: 30 tablet; Refill: 0  -Refill polyethylene glycol powder (GLYCOLAX/MIRALAX) 17 GM/SCOOP powder; Take 17 g by mouth daily.  Dispense: 3350 g; Refill: 0  3. Pure hypercholesterolemia We will obtain labs today.  Heidi Robinson has hyperlipidemia and has been trying to improve her cholesterol levels with intensive lifestyle modifications including a low saturated fat diet, exercise, and weight loss. She denies any chest pain, claudication, or myalgias.  - Comprehensive metabolic panel - Lipid Panel With LDL/HDL Ratio  4. Pre-diabetes We will obtain labs today. We will refill Metformin 500 mg by mouth three times a day for 1 month with 0 refills. Side effects discussed.   - Comprehensive metabolic panel  -  Refill metFORMIN (GLUCOPHAGE) 500 MG tablet; Take 1 tablet (500 mg total) by mouth 3 (three) times daily.  Dispense: 90 tablet; Refill: 0  5. Shortness of breath on exertion IC reviewed with Tyreona today. Improved.  6. Other depression with emotional eating We will refill Wellbutrin SR 200 mg twice a day AND Topamax 100 mg once  at night for 1 month with 0 refills. Side effects discussed.   -Refill buPROPion (WELLBUTRIN SR) 200 MG 12 hr tablet; Take 1 tablet (200 mg total) by mouth 2 (two) times daily.  Dispense: 60 tablet; Refill: 0  -Refill topiramate (TOPAMAX) 100 MG tablet; Take 1 tablet (100 mg total) by mouth at bedtime.  Dispense: 30 tablet; Refill: 0  7. Obesity with current BMI 43.1 Tieasha is currently in the action stage of change. As such, her goal is to continue with weight loss efforts. She has agreed to the Category 3 Plan.   We will obtain labs today. Questions answered about bariatric sugery.  Exercise goals: All adults should avoid inactivity. Some physical activity is better than none, and adults who participate in any amount of physical activity gain some health benefits.  Behavioral modification strategies: increasing lean protein intake, increasing vegetables, and increasing water intake.  Heidi Robinson has agreed to follow-up with our clinic in 4 weeks. She was informed of the importance of frequent follow-up visits to maximize her success with intensive lifestyle modifications for her multiple health conditions.   Heidi Robinson was informed we would discuss her lab results at her next visit unless there is a critical issue that needs to be addressed sooner. Heidi Robinson agreed to keep her next visit at the agreed upon time to discuss these results.  Objective:   Blood pressure 114/66, pulse 100, temperature 98.2 F (36.8 C), height '5\' 2"'$  (1.575 m), weight 235 lb (106.6 kg), SpO2 99 %. Body mass index is 42.98 kg/m.  General: Cooperative, alert, well developed, in no acute distress. HEENT: Conjunctivae and lids unremarkable. Cardiovascular: Regular rhythm.  Lungs: Normal work of breathing. Neurologic: No focal deficits.   Lab Results  Component Value Date   CREATININE 0.77 12/18/2021   BUN 10 12/18/2021   NA 141 12/18/2021   K 4.3 12/18/2021   CL 101 12/18/2021   CO2 23 12/18/2021   Lab Results  Component  Value Date   ALT 10 12/18/2021   AST 11 12/18/2021   ALKPHOS 74 12/18/2021   BILITOT <0.2 12/18/2021   Lab Results  Component Value Date   HGBA1C 5.4 12/18/2021   HGBA1C 4.9 06/11/2021   HGBA1C 4.8 12/13/2020   HGBA1C 5.2 07/30/2020   HGBA1C 5.0 03/11/2020   Lab Results  Component Value Date   INSULIN 16.6 12/18/2021   INSULIN 18.9 06/11/2021   INSULIN 21.0 07/30/2020   INSULIN 17.3 03/11/2020   INSULIN 13.1 09/01/2019   Lab Results  Component Value Date   TSH 2.310 12/13/2020   Lab Results  Component Value Date   CHOL 189 12/18/2021   HDL 45 12/18/2021   LDLCALC 120 (H) 12/18/2021   TRIG 135 12/18/2021   CHOLHDL 3.7 12/13/2020   Lab Results  Component Value Date   VD25OH 47.5 12/18/2021   VD25OH 51.4 06/11/2021   VD25OH 78.7 01/13/2021   Lab Results  Component Value Date   WBC 11.7 (H) 05/18/2022   HGB 9.1 (L) 05/18/2022   HCT 30.3 (L) 05/18/2022   MCV 63.7 (L) 05/18/2022   PLT 476 (H) 05/18/2022   Lab Results  Component Value  Date   IRON 25 (L) 09/01/2019   TIBC 367 09/01/2019   FERRITIN 12 05/18/2022   Attestation Statements:   Reviewed by clinician on day of visit: allergies, medications, problem list, medical history, surgical history, family history, social history, and previous encounter notes.  I, Brendell Tyus, RMA, am acting as transcriptionist for Everardo Pacific, FNP.  I have reviewed the above documentation for accuracy and completeness, and I agree with the above. Everardo Pacific, FNP

## 2022-06-29 NOTE — Telephone Encounter (Signed)
Patient  called to cancel appointment will call back to reschedule

## 2022-07-17 ENCOUNTER — Telehealth (INDEPENDENT_AMBULATORY_CARE_PROVIDER_SITE_OTHER): Payer: Self-pay | Admitting: Family Medicine

## 2022-07-20 ENCOUNTER — Encounter (INDEPENDENT_AMBULATORY_CARE_PROVIDER_SITE_OTHER): Payer: Self-pay | Admitting: Family Medicine

## 2022-07-20 ENCOUNTER — Ambulatory Visit (INDEPENDENT_AMBULATORY_CARE_PROVIDER_SITE_OTHER): Payer: No Typology Code available for payment source | Admitting: Family Medicine

## 2022-07-20 DIAGNOSIS — R7303 Prediabetes: Secondary | ICD-10-CM | POA: Diagnosis not present

## 2022-07-20 DIAGNOSIS — E669 Obesity, unspecified: Secondary | ICD-10-CM

## 2022-07-20 DIAGNOSIS — F3289 Other specified depressive episodes: Secondary | ICD-10-CM

## 2022-07-20 DIAGNOSIS — D508 Other iron deficiency anemias: Secondary | ICD-10-CM | POA: Diagnosis not present

## 2022-07-20 DIAGNOSIS — I10 Essential (primary) hypertension: Secondary | ICD-10-CM | POA: Diagnosis not present

## 2022-07-20 DIAGNOSIS — Z6841 Body Mass Index (BMI) 40.0 and over, adult: Secondary | ICD-10-CM

## 2022-07-20 MED ORDER — LISINOPRIL-HYDROCHLOROTHIAZIDE 10-12.5 MG PO TABS: 1.0000 | ORAL_TABLET | Freq: Every day | ORAL | 0 refills | Status: DC

## 2022-07-20 MED ORDER — TOPIRAMATE 100 MG PO TABS: 100.0000 mg | ORAL_TABLET | Freq: Every day | ORAL | 0 refills | Status: DC

## 2022-07-20 MED ORDER — BUPROPION HCL ER (SR) 200 MG PO TB12: 200.0000 mg | ORAL_TABLET | Freq: Two times a day (BID) | ORAL | 0 refills | Status: DC

## 2022-07-20 MED ORDER — FERROUS SULFATE 325 (65 FE) MG PO TABS: 325.0000 mg | ORAL_TABLET | Freq: Every day | ORAL | 0 refills | Status: DC

## 2022-07-20 MED ORDER — POLYETHYLENE GLYCOL 3350 17 GM/SCOOP PO POWD: 17.0000 g | Freq: Every day | ORAL | 0 refills | Status: DC

## 2022-07-20 MED ORDER — METFORMIN HCL 500 MG PO TABS: 500.0000 mg | ORAL_TABLET | Freq: Three times a day (TID) | ORAL | 0 refills | Status: DC

## 2022-07-20 MED ORDER — NALTREXONE HCL 50 MG PO TABS: ORAL_TABLET | ORAL | 0 refills | Status: DC

## 2022-07-23 ENCOUNTER — Encounter (INDEPENDENT_AMBULATORY_CARE_PROVIDER_SITE_OTHER): Payer: Self-pay | Admitting: Nurse Practitioner

## 2022-07-26 NOTE — Progress Notes (Unsigned)
Chief Complaint:   OBESITY Heidi Robinson is here to discuss her progress with her obesity treatment plan along with follow-up of her obesity related diagnoses. Heidi Robinson is on the Category 3 Plan and states she is following her eating plan approximately 0% of the time. Heidi Robinson states she is doing 0 minutes 0 times per week.  Today's visit was #: 59 Starting weight: 258 lbs Starting date: 12/21/2017 Today's weight: 240 lbs Today's date: 07/20/2022 Total lbs lost to date: 18 Total lbs lost since last in-office visit: 0  Interim History: Heidi Robinson has been off track with meal planning and prepping, and her weight has been creeping up.  She is ready to get back on track with her eating plan.  She has questions about her RMR done previously.  Subjective:   1. Pre-diabetes Heidi Robinson is stable on metformin, and she is working on getting back to her eating plan to avoid diabetes mellitus.  She denies nausea or vomiting.  2. Essential hypertension Heidi Robinson blood pressure is well controlled on her medications, and she denies feeling lightheaded or dizzy.  3. Other iron deficiency anemia Heidi Robinson is on iron and MiraLAX.  She is followed by hematology and she had her iron intake increased recently.  She may need iron infusion.  4. Other depression with emotional eating Heidi Robinson is stable on her medications, with no side effects noted.  She has not been concentrating on decreasing emotional eating behaviors recently but she is working on getting back on track.  Assessment/Plan:   1. Pre-diabetes We will refill metformin for 1 month. Heidi Robinson will continue to work on weight loss, exercise, and decreasing simple carbohydrates to help decrease the risk of diabetes.   - metFORMIN (GLUCOPHAGE) 500 MG tablet; Take 1 tablet (500 mg total) by mouth 3 (three) times daily.  Dispense: 90 tablet; Refill: 0  2. Essential hypertension Heidi Robinson will continue lisinopril-hydrochlorothiazide 10-12.5 mg once daily, and we will refill for 1 month.    - lisinopril-hydrochlorothiazide (ZESTORETIC) 10-12.5 MG tablet; Take 1 tablet by mouth daily. Take 1 tablet by mouth daily.  Dispense: 30 tablet; Refill: 0  3. Other iron deficiency anemia Heidi Robinson will continue iron and MiraLAX, and we will refill both for 1 month.  - ferrous sulfate 325 (65 FE) MG tablet; Take 1 tablet (325 mg total) by mouth daily with breakfast.  Dispense: 30 tablet; Refill: 0 - polyethylene glycol powder (GLYCOLAX/MIRALAX) 17 GM/SCOOP powder; Take 17 g by mouth daily.  Dispense: 3350 g; Refill: 0  4. Other depression with emotional eating Heidi Robinson will continue her medications, and we will refill Topamax, Wellbutrin SR, and naltrexone for 1 month.  - buPROPion (WELLBUTRIN SR) 200 MG 12 hr tablet; Take 1 tablet (200 mg total) by mouth 2 (two) times daily.  Dispense: 60 tablet; Refill: 0 - topiramate (TOPAMAX) 100 MG tablet; Take 1 tablet (100 mg total) by mouth at bedtime.  Dispense: 30 tablet; Refill: 0 - naltrexone (DEPADE) 50 MG tablet; Take 1/2 tablet dinnertime  Dispense: 15 tablet; Refill: 0  5. Obesity with current BMI 44.0 Heidi Robinson is currently in the action stage of change. As such, her goal is to continue with weight loss efforts. She has agreed to the Category 3 Plan.   We discussed how easy RMR increases when gaining weight, but how rapidly it decreases if nutrition is an adequate.  Behavioral modification strategies: increasing lean protein intake and no skipping meals.  Heidi Robinson has agreed to follow-up with our clinic in 3 to 4  weeks. She was informed of the importance of frequent follow-up visits to maximize her success with intensive lifestyle modifications for her multiple health conditions.   Objective:   Blood pressure 111/72, pulse 82, temperature 98.1 F (36.7 C), height '5\' 2"'$  (1.575 m), weight 240 lb (108.9 kg), SpO2 100 %. Body mass index is 43.9 kg/m.  General: Cooperative, alert, well developed, in no acute distress. HEENT: Conjunctivae and lids  unremarkable. Cardiovascular: Regular rhythm.  Lungs: Normal work of breathing. Neurologic: No focal deficits.   Lab Results  Component Value Date   CREATININE 0.77 12/18/2021   BUN 10 12/18/2021   NA 141 12/18/2021   K 4.3 12/18/2021   CL 101 12/18/2021   CO2 23 12/18/2021   Lab Results  Component Value Date   ALT 10 12/18/2021   AST 11 12/18/2021   ALKPHOS 74 12/18/2021   BILITOT <0.2 12/18/2021   Lab Results  Component Value Date   HGBA1C 5.4 12/18/2021   HGBA1C 4.9 06/11/2021   HGBA1C 4.8 12/13/2020   HGBA1C 5.2 07/30/2020   HGBA1C 5.0 03/11/2020   Lab Results  Component Value Date   INSULIN 16.6 12/18/2021   INSULIN 18.9 06/11/2021   INSULIN 21.0 07/30/2020   INSULIN 17.3 03/11/2020   INSULIN 13.1 09/01/2019   Lab Results  Component Value Date   TSH 2.310 12/13/2020   Lab Results  Component Value Date   CHOL 189 12/18/2021   HDL 45 12/18/2021   LDLCALC 120 (H) 12/18/2021   TRIG 135 12/18/2021   CHOLHDL 3.7 12/13/2020   Lab Results  Component Value Date   VD25OH 47.5 12/18/2021   VD25OH 51.4 06/11/2021   VD25OH 78.7 01/13/2021   Lab Results  Component Value Date   WBC 11.7 (H) 05/18/2022   HGB 9.1 (L) 05/18/2022   HCT 30.3 (L) 05/18/2022   MCV 63.7 (L) 05/18/2022   PLT 476 (H) 05/18/2022   Lab Results  Component Value Date   IRON 25 (L) 09/01/2019   TIBC 367 09/01/2019   FERRITIN 12 05/18/2022   Attestation Statements:   Reviewed by clinician on day of visit: allergies, medications, problem list, medical history, surgical history, family history, social history, and previous encounter notes.   I, Trixie Dredge, am acting as transcriptionist for Dennard Nip, MD.  I have reviewed the above documentation for accuracy and completeness, and I agree with the above. -  Dennard Nip, MD

## 2022-08-17 ENCOUNTER — Ambulatory Visit (INDEPENDENT_AMBULATORY_CARE_PROVIDER_SITE_OTHER): Payer: No Typology Code available for payment source | Admitting: Family Medicine

## 2022-08-17 ENCOUNTER — Telehealth (INDEPENDENT_AMBULATORY_CARE_PROVIDER_SITE_OTHER): Payer: No Typology Code available for payment source | Admitting: Family Medicine

## 2022-08-17 ENCOUNTER — Encounter (INDEPENDENT_AMBULATORY_CARE_PROVIDER_SITE_OTHER): Payer: Self-pay | Admitting: Family Medicine

## 2022-08-17 DIAGNOSIS — Z6841 Body Mass Index (BMI) 40.0 and over, adult: Secondary | ICD-10-CM

## 2022-08-17 DIAGNOSIS — R7303 Prediabetes: Secondary | ICD-10-CM | POA: Diagnosis not present

## 2022-08-17 DIAGNOSIS — F3289 Other specified depressive episodes: Secondary | ICD-10-CM

## 2022-08-17 DIAGNOSIS — E669 Obesity, unspecified: Secondary | ICD-10-CM

## 2022-08-17 MED ORDER — TOPIRAMATE 100 MG PO TABS: 100.0000 mg | ORAL_TABLET | Freq: Every day | ORAL | 0 refills | Status: DC

## 2022-08-17 MED ORDER — METFORMIN HCL 500 MG PO TABS: 500.0000 mg | ORAL_TABLET | Freq: Three times a day (TID) | ORAL | 0 refills | Status: DC

## 2022-08-17 MED ORDER — BUPROPION HCL ER (SR) 200 MG PO TB12
200.0000 mg | ORAL_TABLET | Freq: Two times a day (BID) | ORAL | 0 refills | Status: DC
Start: 2022-08-17 — End: 2022-09-14

## 2022-08-17 NOTE — Progress Notes (Signed)
TeleHealth Visit:  This visit was completed with telemedicine (audio/video) technology. Heidi Robinson has verbally consented to this TeleHealth visit. The patient is located at home, the provider is located at home. The participants in this visit include the listed provider and patient. The visit was conducted today via MyChart video.  OBESITY Heidi Robinson is here to discuss her progress with her obesity treatment plan along with follow-up of her obesity related diagnoses.   Today's visit was # 38 Starting weight: 258 lbs Starting date: 12/21/2017 Weight at last in office visit: 240 lbs on 07/20/22 Total weight loss: 18 lbs at last in office visit on 07/20/22. Today's reported weight:  No weight reported.  Nutrition Plan: the Category 3 Plan.   Current exercise: no regular exercise  Interim History: Heidi Robinson initially did well on our program losing from 258 pounds down to 218.  She then gradually regained weight.  Last in office weight was 240 pounds. She feels that the weight regain is due to drinking sweet tea and soda, stopped meal planning/prepping, started skipping meals again. Her life is very hectic.  She has a full-time job and she is also a [employer]. She denies hunger or cravings. A typical day might be skipping breakfast, lunch at 3 to 4 PM, and dinner at 10 PM.  Sometimes 10 PM meal might be takeout.  She would like to alternate virtual and in office visits.  Assessment/Plan:  1. Prediabetes A1c in February 2019 was 5.7.  Most recent A1c is 5.4. Medication(s): Metformin 500 mg 3 times daily.  She reports taking it twice daily. Lab Results  Component Value Date   HGBA1C 5.4 12/18/2021   Lab Results  Component Value Date   INSULIN 16.6 12/18/2021   INSULIN 18.9 06/11/2021   INSULIN 21.0 07/30/2020   INSULIN 17.3 03/11/2020   INSULIN 13.1 09/01/2019    Plan: Refill metformin 500 mg 3 times daily. She plans to try to remember to take it 3 times a day. Check labs next  visit.   2. Other depression/emotional eating Denies issues with cravings currently. Medication(s): Bupropion 200 mg twice daily, Topamax 100 mg nightly.  She was on naltrexone 25 mg daily but stopped taking less because of side effects.  Plan: Refill Topamax 100 mg nightly Refill bupropion 200 mg twice daily.  3. Obesity: Current BMI 47 Heidi Robinson is currently in the action stage of change. As such, her goal is to continue with weight loss efforts.  She has agreed to the Category 3 Plan.   Discussed journaling and mutually decided it was not the right plan for her. Work on cutting out sugar sweetened beverages. Reduce meal skipping. Use dining out guide to plan takeout meals. Pack food to take with her when she is on the road. Provided journaling numbers for dinner to give her a guideline-450-600 cal and 40 g of protein.  Exercise goals: All adults should avoid inactivity. Some physical activity is better than none, and adults who participate in any amount of physical activity gain some health benefits.  Behavioral modification strategies: increasing lean protein intake, decreasing simple carbohydrates, increasing water intake, decreasing liquid calories, meal planning and cooking strategies, and planning for success.  Heidi Robinson has agreed to follow-up with our clinic in 4 weeks.   No orders of the defined types were placed in this encounter.   Medications Discontinued During This Encounter  Medication Reason   naltrexone (DEPADE) 50 MG tablet Side effect (s)   buPROPion (WELLBUTRIN SR) 200 MG 12 hr tablet  Reorder   metFORMIN (GLUCOPHAGE) 500 MG tablet Reorder   topiramate (TOPAMAX) 100 MG tablet Reorder     Meds ordered this encounter  Medications   buPROPion (WELLBUTRIN SR) 200 MG 12 hr tablet    Sig: Take 1 tablet (200 mg total) by mouth 2 (two) times daily.    Dispense:  60 tablet    Refill:  0    Order Specific Question:   Supervising Provider    Answer:   Dell Ponto  [[id number]]   topiramate (TOPAMAX) 100 MG tablet    Sig: Take 1 tablet (100 mg total) by mouth at bedtime.    Dispense:  30 tablet    Refill:  0    Order Specific Question:   Supervising Provider    Answer:   Dell Ponto [[id number]]   metFORMIN (GLUCOPHAGE) 500 MG tablet    Sig: Take 1 tablet (500 mg total) by mouth 3 (three) times daily.    Dispense:  90 tablet    Refill:  0    Order Specific Question:   Supervising Provider    Answer:   Dell Ponto [[id number]]      Objective:   VITALS: Per patient if applicable, see vitals. GENERAL: Alert and in no acute distress. CARDIOPULMONARY: No increased WOB. Speaking in clear sentences.  PSYCH: Pleasant and cooperative. Speech normal rate and rhythm. Affect is appropriate. Insight and judgement are appropriate. Attention is focused, linear, and appropriate.  NEURO: Oriented as arrived to appointment on time with no prompting.   Lab Results  Component Value Date   CREATININE 0.77 12/18/2021   BUN 10 12/18/2021   NA 141 12/18/2021   K 4.3 12/18/2021   CL 101 12/18/2021   CO2 23 12/18/2021   Lab Results  Component Value Date   ALT 10 12/18/2021   AST 11 12/18/2021   ALKPHOS 74 12/18/2021   BILITOT <0.2 12/18/2021   Lab Results  Component Value Date   HGBA1C 5.4 12/18/2021   HGBA1C 4.9 06/11/2021   HGBA1C 4.8 12/13/2020   HGBA1C 5.2 07/30/2020   HGBA1C 5.0 03/11/2020   Lab Results  Component Value Date   INSULIN 16.6 12/18/2021   INSULIN 18.9 06/11/2021   INSULIN 21.0 07/30/2020   INSULIN 17.3 03/11/2020   INSULIN 13.1 09/01/2019   Lab Results  Component Value Date   TSH 2.310 12/13/2020   Lab Results  Component Value Date   CHOL 189 12/18/2021   HDL 45 12/18/2021   LDLCALC 120 (H) 12/18/2021   TRIG 135 12/18/2021   CHOLHDL 3.7 12/13/2020   Lab Results  Component Value Date   WBC 11.7 (H) 05/18/2022   HGB 9.1 (L) 05/18/2022   HCT 30.3 (L) 05/18/2022   MCV 63.7 (L) 05/18/2022   PLT 476 (H) 05/18/2022   Lab Results   Component Value Date   IRON 25 (L) 09/01/2019   TIBC 367 09/01/2019   FERRITIN 12 05/18/2022   Lab Results  Component Value Date   VD25OH 47.5 12/18/2021   VD25OH 51.4 06/11/2021   VD25OH 78.7 01/13/2021    Attestation Statements:   Reviewed by clinician on day of visit: allergies, medications, problem list, medical history, surgical history, family history, social history, and previous encounter notes.

## 2022-09-07 ENCOUNTER — Encounter (INDEPENDENT_AMBULATORY_CARE_PROVIDER_SITE_OTHER): Payer: Self-pay | Admitting: Family Medicine

## 2022-09-07 ENCOUNTER — Other Ambulatory Visit (INDEPENDENT_AMBULATORY_CARE_PROVIDER_SITE_OTHER): Payer: Self-pay | Admitting: Family Medicine

## 2022-09-07 ENCOUNTER — Telehealth (INDEPENDENT_AMBULATORY_CARE_PROVIDER_SITE_OTHER): Payer: Self-pay | Admitting: Family Medicine

## 2022-09-07 DIAGNOSIS — I10 Essential (primary) hypertension: Secondary | ICD-10-CM

## 2022-09-07 NOTE — Telephone Encounter (Signed)
Patient called to get an explanation as to why her blood pressure medication request was denied. She stated doctor Dr.Beasley usually does this for her. If you would please give her a call.

## 2022-09-14 ENCOUNTER — Ambulatory Visit (INDEPENDENT_AMBULATORY_CARE_PROVIDER_SITE_OTHER): Payer: No Typology Code available for payment source | Admitting: Family Medicine

## 2022-09-14 ENCOUNTER — Other Ambulatory Visit: Payer: Self-pay | Admitting: Obstetrics and Gynecology

## 2022-09-14 ENCOUNTER — Encounter (INDEPENDENT_AMBULATORY_CARE_PROVIDER_SITE_OTHER): Payer: Self-pay | Admitting: Family Medicine

## 2022-09-14 VITALS — BP 113/72 | HR 72 | Temp 98.6°F | Ht 62.0 in | Wt 237.0 lb

## 2022-09-14 DIAGNOSIS — Z6841 Body Mass Index (BMI) 40.0 and over, adult: Secondary | ICD-10-CM

## 2022-09-14 DIAGNOSIS — I10 Essential (primary) hypertension: Secondary | ICD-10-CM

## 2022-09-14 DIAGNOSIS — Z1231 Encounter for screening mammogram for malignant neoplasm of breast: Secondary | ICD-10-CM

## 2022-09-14 DIAGNOSIS — K5909 Other constipation: Secondary | ICD-10-CM

## 2022-09-14 DIAGNOSIS — E669 Obesity, unspecified: Secondary | ICD-10-CM

## 2022-09-14 DIAGNOSIS — F3289 Other specified depressive episodes: Secondary | ICD-10-CM

## 2022-09-14 DIAGNOSIS — R7303 Prediabetes: Secondary | ICD-10-CM | POA: Diagnosis not present

## 2022-09-14 DIAGNOSIS — D508 Other iron deficiency anemias: Secondary | ICD-10-CM | POA: Diagnosis not present

## 2022-09-14 MED ORDER — TOPIRAMATE 100 MG PO TABS: 100.0000 mg | ORAL_TABLET | Freq: Every day | ORAL | 0 refills | Status: DC

## 2022-09-14 MED ORDER — POLYETHYLENE GLYCOL 3350 17 GM/SCOOP PO POWD: 17.0000 g | Freq: Every day | ORAL | 0 refills | Status: DC

## 2022-09-14 MED ORDER — FERROUS SULFATE 325 (65 FE) MG PO TABS: 325.0000 mg | ORAL_TABLET | Freq: Every day | ORAL | 0 refills | Status: DC

## 2022-09-14 MED ORDER — LISINOPRIL-HYDROCHLOROTHIAZIDE 10-12.5 MG PO TABS: 1.0000 | ORAL_TABLET | Freq: Every day | ORAL | 0 refills | Status: DC

## 2022-09-14 MED ORDER — BUPROPION HCL ER (SR) 200 MG PO TB12: 200.0000 mg | ORAL_TABLET | Freq: Two times a day (BID) | ORAL | 0 refills | Status: DC

## 2022-09-14 MED ORDER — METFORMIN HCL 500 MG PO TABS: 500.0000 mg | ORAL_TABLET | Freq: Three times a day (TID) | ORAL | 0 refills | Status: DC

## 2022-09-22 NOTE — Progress Notes (Signed)
Chief Complaint:   OBESITY Heidi Robinson is here to discuss her progress with her obesity treatment plan along with follow-up of her obesity related diagnoses. Heidi Robinson is on the Category 3 Plan and states she is following her eating plan approximately 0% of the time. Heidi Robinson states she is doing 0 minutes 0 times per week.  Today's visit was #: 26 Starting weight: 258 lbs Starting date: 12/21/2017 Today's weight: 237 lbs Today's date: 09/14/2022 Total lbs lost to date: 21 Total lbs lost since last in-office visit: 3  Interim History: Heidi Robinson has been doing well with her weight loss.  Her son just has surgery for his knee and he is doing well, and has returned to college now.  She is asking about compounding pharmacies which are selling GLP-one drugs.  We discussed that this is not FDA approved, and discussed concerns for side effects or other serious complications.  Subjective:   1. Pre-diabetes Heidi Robinson is taking metformin with no side effects.   2. Essential hypertension Heidi Robinson is taking Zestoretic, and her blood pressure is at goal. No side effects were noted.   3. Other iron deficiency anemia Heidi Robinson is taking ferrous sulfate with no side effects noted.   4. Other constipation Heidi Robinson is taking MiraLAX regularly.  5. Other depression with emotional eating Heidi Robinson is taking Topamax and Wellbutrin with no side effects noted.   Assessment/Plan:   1. Pre-diabetes We will check labs today, and we will refill metformin for 1 month.  Heidi Robinson will continue with her diet and exercise.   - metFORMIN (GLUCOPHAGE) 500 MG tablet; Take 1 tablet (500 mg total) by mouth 3 (three) times daily.  Dispense: 90 tablet; Refill: 0 - Hemoglobin A1c - Insulin, random - Lipid Panel With LDL/HDL Ratio - VITAMIN D 25 Hydroxy (Vit-D Deficiency, Fractures)  2. Essential hypertension We will check labs today, and we will refill Zestoretic for 1 month.   - lisinopril-hydrochlorothiazide (ZESTORETIC) 10-12.5 MG tablet; Take 1  tablet by mouth daily. Take 1 tablet by mouth daily.  Dispense: 30 tablet; Refill: 0 - CMP14+EGFR  3. Other iron deficiency anemia We will check labs today, and  we will refill ferrous sulfate for 1 month.   - ferrous sulfate 325 (65 FE) MG tablet; Take 1 tablet (325 mg total) by mouth daily with breakfast.  Dispense: 30 tablet; Refill: 0 - CBC with Differential/Platelet - Folate - Iron and TIBC - Ferritin - Vitamin B12 - TSH  4. Other constipation [name] will continue MiraLAX 17 g daily, and we will refill for 1 month.  - polyethylene glycol powder (GLYCOLAX/MIRALAX) 17 GM/SCOOP powder; Take 17 g by mouth daily.  Dispense: 3350 g; Refill: 0  5. Other depression with emotional eating [name] will continue her medications, and we will refill Wellbutrin SR and Topamax for 1 month.  - buPROPion (WELLBUTRIN SR) 200 MG 12 hr tablet; Take 1 tablet (200 mg total) by mouth 2 (two) times daily.  Dispense: 60 tablet; Refill: 0 - topiramate (TOPAMAX) 100 MG tablet; Take 1 tablet (100 mg total) by mouth at bedtime.  Dispense: 30 tablet; Refill: 0  6. Obesity with current BMI of 43.4 Heidi Robinson is currently in the action stage of change. As such, her goal is to continue with weight loss efforts. She has agreed to the Category 3 Plan.   Exercise goals: All adults should avoid inactivity. Some physical activity is better than none, and adults who participate in any amount of physical activity gain some health benefits.  Behavioral modification strategies: increasing lean protein intake, decreasing simple carbohydrates, meal planning and cooking strategies, keeping healthy foods in the home, and holiday eating strategies .  Heidi Robinson has agreed to follow-up with our clinic in 4 weeks via virtual with [employer], FNP-C. She was informed of the importance of frequent follow-up visits to maximize her success with intensive lifestyle modifications for her multiple health conditions.   Heidi Robinson was informed we would  discuss her lab results at her next visit unless there is a critical issue that needs to be addressed sooner. Heidi Robinson agreed to keep her next visit at the agreed upon time to discuss these results.  Objective:   Blood pressure 113/72, pulse 72, temperature 98.6 F (37 C), height _0  (1.575 m), weight 237 lb (107.5 kg), SpO2 100 %. Body mass index is 43.35 kg/m.  General: Cooperative, alert, well developed, in no acute distress. HEENT: Conjunctivae and lids unremarkable. Cardiovascular: Regular rhythm.  Lungs: Normal work of breathing. Neurologic: No focal deficits.   Lab Results  Component Value Date   CREATININE 0.77 12/18/2021   BUN 10 12/18/2021   NA 141 12/18/2021   K 4.3 12/18/2021   CL 101 12/18/2021   CO2 23 12/18/2021   Lab Results  Component Value Date   ALT 10 12/18/2021   AST 11 12/18/2021   ALKPHOS 74 12/18/2021   BILITOT <0.2 12/18/2021   Lab Results  Component Value Date   HGBA1C 5.4 12/18/2021   HGBA1C 4.9 06/11/2021   HGBA1C 4.8 12/13/2020   HGBA1C 5.2 07/30/2020   HGBA1C 5.0 03/11/2020   Lab Results  Component Value Date   INSULIN 16.6 12/18/2021   INSULIN 18.9 06/11/2021   INSULIN 21.0 07/30/2020   INSULIN 17.3 03/11/2020   INSULIN 13.1 09/01/2019   Lab Results  Component Value Date   TSH 2.310 12/13/2020   Lab Results  Component Value Date   CHOL 189 12/18/2021   HDL 45 12/18/2021   LDLCALC 120 (H) 12/18/2021   TRIG 135 12/18/2021   CHOLHDL 3.7 12/13/2020   Lab Results  Component Value Date   VD25OH 47.5 12/18/2021   VD25OH 51.4 06/11/2021   VD25OH 78.7 01/13/2021   Lab Results  Component Value Date   WBC 11.7 (H) 05/18/2022   HGB 9.1 (L) 05/18/2022   HCT 30.3 (L) 05/18/2022   MCV 63.7 (L) 05/18/2022   PLT 476 (H) 05/18/2022   Lab Results  Component Value Date   IRON 25 (L) 09/01/2019   TIBC 367 09/01/2019   FERRITIN 12 05/18/2022   Attestation Statements:   Reviewed by clinician on day of visit: allergies,  medications, problem list, medical history, surgical history, family history, social history, and previous encounter notes.   I, Trixie Dredge, am acting as transcriptionist for Dennard Nip, MD.  I have reviewed the above documentation for accuracy and completeness, and I agree with the above. -  Dennard Nip, MD

## 2022-10-08 NOTE — Progress Notes (Signed)
TeleHealth Visit:  This visit was completed with telemedicine (audio/video) technology. Heidi Robinson has verbally consented to this TeleHealth visit. The patient is located at home, the provider is located at home. The participants in this visit include the listed provider and patient. The visit was conducted today via MyChart video.  OBESITY Heidi Robinson is here to discuss her progress with her obesity treatment plan along with follow-up of her obesity related diagnoses.   Today's visit was # 74 Starting weight: 258 lbs Starting date: 12/21/2017 Weight at last in office visit: 237 lbs on 09/14/22 Total weight loss: 21 lbs at last in office visit on 09/14/22. Today's reported weight:  No weight reported.  Nutrition Plan: the Category 3 Plan.   Current exercise:  none  Interim History: Heidi Robinson has been sick with an upper respiratory infection and has not had an appetite.  She has not been following category 3 recently. She is working on getting back to category 3 plan.  She and her family eat out dinner every day.  She skips lunch 2 to 3 days/week.  Sometimes she eats out at lunch, sometimes she eats at home.  She has the eating out guide. She drinks soda or sweet tea twice daily. Has tried journaling in the past but was not consistent with it. Is not interested in trying low-carb because she feels it is not sustainable.  Labs ordered last office visit but were not drawn at our office because our lab tech has difficulty drawing her blood.  She may get these drawn at an upcoming doctor's appointment. Advised that she may get these drawn at any [facility].  Assessment/Plan:  1. Hypertension Hypertension well controlled.  Does not check blood pressure at home. Medication(s): Lisinopril-HCTZ 10-12.5 mg daily.   BP Readings from Last 3 Encounters:  09/14/22 113/72  07/20/22 111/72  06/22/22 114/66   Lab Results  Component Value Date   CREATININE 0.77 12/18/2021   CREATININE 0.93 06/11/2021    CREATININE 0.89 12/13/2020    Plan: Refill lisinopril-HCTZ 10-12.5 mg daily.   2. Prediabetes Last A1c 5.4 on 12/18/2021. Medication(s): Metformin 500 mg 3 times daily with meals.  Feels it is beneficial for polyphagia.  Denies side effects. Lab Results  Component Value Date   HGBA1C 5.4 12/18/2021   Lab Results  Component Value Date   INSULIN 16.6 12/18/2021   INSULIN 18.9 06/11/2021   INSULIN 21.0 07/30/2020   INSULIN 17.3 03/11/2020   INSULIN 13.1 09/01/2019    Plan: Refill metformin 500 mg 3 times daily with meals.   3.  Other depression/emotional eating Heidi Robinson has had issues with stress/emotional eating.   Medication(s): Topiramate 100 mg at bedtime, bupropion 200 mg twice daily. She feels both the bupropion and topiramate are beneficial for reducing cravings. Currently this is moderately controlled. Overall mood is stable.   Plan: Refill topiramate 100 mg at bedtime. Refill bupropion 200 mg twice daily  4. Obesity: Current BMI 43.4 Heidi Robinson is currently in the action stage of change. As such, her goal is to continue with weight loss efforts.  She has agreed to the Category 3 Plan.   1.  Eat lunch at home 2.  Reduce sugar sweetened beverages to once daily. 3.  No skipping meals.  Exercise goals: No exercise has been prescribed at this time.  Behavioral modification strategies: increasing lean protein intake, decreasing simple carbohydrates, decreasing liquid calories, decreasing eating out, meal planning and cooking strategies, and planning for success.  Meghna has agreed to follow-up with our  clinic in 4 weeks.   No orders of the defined types were placed in this encounter.   Medications Discontinued During This Encounter  Medication Reason   buPROPion (WELLBUTRIN SR) 200 MG 12 hr tablet Reorder   lisinopril-hydrochlorothiazide (ZESTORETIC) 10-12.5 MG tablet Reorder   metFORMIN (GLUCOPHAGE) 500 MG tablet Reorder   topiramate (TOPAMAX) 100 MG tablet Reorder      Meds ordered this encounter  Medications   lisinopril-hydrochlorothiazide (ZESTORETIC) 10-12.5 MG tablet    Sig: Take 1 tablet by mouth daily. Take 1 tablet by mouth daily.    Dispense:  30 tablet    Refill:  0    Order Specific Question:   Supervising Provider    Answer:   Dell Ponto [[id number]]   metFORMIN (GLUCOPHAGE) 500 MG tablet    Sig: Take 1 tablet (500 mg total) by mouth 3 (three) times daily.    Dispense:  90 tablet    Refill:  0    Order Specific Question:   Supervising Provider    Answer:   Dell Ponto [[id number]]   buPROPion (WELLBUTRIN SR) 200 MG 12 hr tablet    Sig: Take 1 tablet (200 mg total) by mouth 2 (two) times daily.    Dispense:  60 tablet    Refill:  0    Order Specific Question:   Supervising Provider    Answer:   Dell Ponto [[id number]]   topiramate (TOPAMAX) 100 MG tablet    Sig: Take 1 tablet (100 mg total) by mouth at bedtime.    Dispense:  30 tablet    Refill:  0    Order Specific Question:   Supervising Provider    Answer:   Dell Ponto [[id number]]      Objective:   VITALS: Per patient if applicable, see vitals. GENERAL: Alert and in no acute distress. CARDIOPULMONARY: No increased WOB. Speaking in clear sentences.  PSYCH: Pleasant and cooperative. Speech normal rate and rhythm. Affect is appropriate. Insight and judgement are appropriate. Attention is focused, linear, and appropriate.  NEURO: Oriented as arrived to appointment on time with no prompting.   Lab Results  Component Value Date   CREATININE 0.77 12/18/2021   BUN 10 12/18/2021   NA 141 12/18/2021   K 4.3 12/18/2021   CL 101 12/18/2021   CO2 23 12/18/2021   Lab Results  Component Value Date   ALT 10 12/18/2021   AST 11 12/18/2021   ALKPHOS 74 12/18/2021   BILITOT <0.2 12/18/2021   Lab Results  Component Value Date   HGBA1C 5.4 12/18/2021   HGBA1C 4.9 06/11/2021   HGBA1C 4.8 12/13/2020   HGBA1C 5.2 07/30/2020   HGBA1C 5.0 03/11/2020   Lab Results  Component Value Date    INSULIN 16.6 12/18/2021   INSULIN 18.9 06/11/2021   INSULIN 21.0 07/30/2020   INSULIN 17.3 03/11/2020   INSULIN 13.1 09/01/2019   Lab Results  Component Value Date   TSH 2.310 12/13/2020   Lab Results  Component Value Date   CHOL 189 12/18/2021   HDL 45 12/18/2021   LDLCALC 120 (H) 12/18/2021   TRIG 135 12/18/2021   CHOLHDL 3.7 12/13/2020   Lab Results  Component Value Date   WBC 11.7 (H) 05/18/2022   HGB 9.1 (L) 05/18/2022   HCT 30.3 (L) 05/18/2022   MCV 63.7 (L) 05/18/2022   PLT 476 (H) 05/18/2022   Lab Results  Component Value Date   IRON 25 (L) 09/01/2019   TIBC 367  09/01/2019   FERRITIN 12 05/18/2022   Lab Results  Component Value Date   VD25OH 47.5 12/18/2021   VD25OH 51.4 06/11/2021   VD25OH 78.7 01/13/2021    Attestation Statements:   Reviewed by clinician on day of visit: allergies, medications, problem list, medical history, surgical history, family history, social history, and previous encounter notes.

## 2022-10-12 ENCOUNTER — Encounter: Payer: Self-pay | Admitting: Gastroenterology

## 2022-10-12 ENCOUNTER — Encounter (INDEPENDENT_AMBULATORY_CARE_PROVIDER_SITE_OTHER): Payer: Self-pay | Admitting: Family Medicine

## 2022-10-12 ENCOUNTER — Telehealth (INDEPENDENT_AMBULATORY_CARE_PROVIDER_SITE_OTHER): Payer: No Typology Code available for payment source | Admitting: Family Medicine

## 2022-10-12 DIAGNOSIS — R7303 Prediabetes: Secondary | ICD-10-CM | POA: Diagnosis not present

## 2022-10-12 DIAGNOSIS — F3289 Other specified depressive episodes: Secondary | ICD-10-CM | POA: Diagnosis not present

## 2022-10-12 DIAGNOSIS — I10 Essential (primary) hypertension: Secondary | ICD-10-CM | POA: Diagnosis not present

## 2022-10-12 DIAGNOSIS — E669 Obesity, unspecified: Secondary | ICD-10-CM | POA: Diagnosis not present

## 2022-10-12 DIAGNOSIS — Z6841 Body Mass Index (BMI) 40.0 and over, adult: Secondary | ICD-10-CM

## 2022-10-12 MED ORDER — TOPIRAMATE 100 MG PO TABS: 100.0000 mg | ORAL_TABLET | Freq: Every day | ORAL | 0 refills | Status: DC

## 2022-10-12 MED ORDER — METFORMIN HCL 500 MG PO TABS: 500.0000 mg | ORAL_TABLET | Freq: Three times a day (TID) | ORAL | 0 refills | Status: DC

## 2022-10-12 MED ORDER — BUPROPION HCL ER (SR) 200 MG PO TB12: 200.0000 mg | ORAL_TABLET | Freq: Two times a day (BID) | ORAL | 0 refills | Status: DC

## 2022-10-12 MED ORDER — LISINOPRIL-HYDROCHLOROTHIAZIDE 10-12.5 MG PO TABS: 1.0000 | ORAL_TABLET | Freq: Every day | ORAL | 0 refills | Status: DC

## 2022-11-05 NOTE — Progress Notes (Signed)
TeleHealth Visit:  This visit was completed with telemedicine (audio/video) technology. Heidi Robinson has verbally consented to this TeleHealth visit. The patient is located at home, the provider is located at home. The participants in this visit include the listed provider and patient. The visit was conducted today via MyChart video.  OBESITY Heidi Robinson is here to discuss her progress with her obesity treatment plan along with follow-up of her obesity related diagnoses.   Today's visit was # 60 Starting weight: 258 lbs Starting date: 12/21/2017 Weight at last in office visit: 237 lbs on 09/14/22 Total weight loss: 21 lbs at last in office visit on 09/14/22. Today's reported weight:  No weight reported.  Nutrition Plan: the Category 3 Plan- 50% adherence prior to getting sick.    Current exercise:   Hand weights 3-4 days per week  Interim History:  Heidi Robinson is ill again with another upper respiratory infection.  Appetite is down and she has not been eating much.  She has made progress with eating lunch at home more often.  She has also cut back on sweet tea and soda. Her family tends to eat out for dinner.  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  This makes cooking dinner difficult.  Skips breakfast at times.  She feels like before she got sick she was getting in the prescribed protein.  Assessment/Plan:  1. Hypertension Hypertension well controlled.  Medication(s): Lisinopril-HCTZ 10-12.5 mg daily.  BP Readings from Last 3 Encounters:  09/14/22 113/72  07/20/22 111/72  06/22/22 114/66   Lab Results  Component Value Date   CREATININE 0.77 12/18/2021   CREATININE 0.93 06/11/2021   CREATININE 0.89 12/13/2020    Plan: Refill lisinopril-HCTZ 10-12.5 mg daily.   2. Eating disorder/emotional eating Denies any emotional eating currently.  Overall mood is stable. Denies suicidal/homicidal ideation. Medication(s): Bupropion 200 mg twice daily, topiramate 100 mg at  bedtime.  Plan: Refill bupropion 200 mg twice daily. Refill topiramate 100 mg at bedtime.  3. Prediabetes Heidi Robinson has a diagnosis of prediabetes based on her elevated HgA1c. Medication(s): Metformin 500 mg 3 times daily with meals Lab Results  Component Value Date   HGBA1C 5.4 12/18/2021   Lab Results  Component Value Date   INSULIN 16.6 12/18/2021   INSULIN 18.9 06/11/2021   INSULIN 21.0 07/30/2020   INSULIN 17.3 03/11/2020   INSULIN 13.1 09/01/2019    Plan: Refill metformin 500 mg 3 times daily with meals   4.  Obesity: Current BMI 43.4 Heidi Robinson is currently in the action stage of change. As such, her goal is to continue with weight loss efforts.  She has agreed to the Category 3 Plan.   1.  Continue to eat lunch at home. 2.  May have a protein bar (with at least 20 g of protein) or protein shake for breakfast. 3.  Try to meal plan/cook dinners at home.  Enlist husband's help  Exercise goals: All adults should avoid inactivity. Some physical activity is better than none, and adults who participate in any amount of physical activity gain some health benefits.  Behavioral modification strategies: increasing lean protein intake, decreasing simple carbohydrates, decreasing liquid calories, decreasing eating out, meal planning and cooking strategies, and planning for success.  Heidi Robinson has agreed to follow-up with our clinic in 5 weeks.   No orders of the defined types were placed in this encounter.   Medications Discontinued During This Encounter  Medication Reason   lisinopril-hydrochlorothiazide (ZESTORETIC) 10-12.5 MG tablet Reorder  metFORMIN (GLUCOPHAGE) 500 MG tablet Reorder   buPROPion (WELLBUTRIN SR) 200 MG 12 hr tablet Reorder   topiramate (TOPAMAX) 100 MG tablet Reorder     Meds ordered this encounter  Medications   lisinopril-hydrochlorothiazide (ZESTORETIC) 10-12.5 MG tablet    Sig: Take 1 tablet by mouth daily. Take 1 tablet by mouth daily.    Dispense:  30  tablet    Refill:  0    Order Specific Question:   Supervising Provider    Answer:   Dell Ponto [[id number]]   topiramate (TOPAMAX) 100 MG tablet    Sig: Take 1 tablet (100 mg total) by mouth at bedtime.    Dispense:  30 tablet    Refill:  0    Order Specific Question:   Supervising Provider    Answer:   Dell Ponto [[id number]]   metFORMIN (GLUCOPHAGE) 500 MG tablet    Sig: Take 1 tablet (500 mg total) by mouth 3 (three) times daily.    Dispense:  90 tablet    Refill:  0    Order Specific Question:   Supervising Provider    Answer:   Dell Ponto [[id number]]   buPROPion (WELLBUTRIN SR) 200 MG 12 hr tablet    Sig: Take 1 tablet (200 mg total) by mouth 2 (two) times daily.    Dispense:  60 tablet    Refill:  0    Order Specific Question:   Supervising Provider    Answer:   Dell Ponto [[id number]]      Objective:   VITALS: Per patient if applicable, see vitals. GENERAL: Alert and in no acute distress. CARDIOPULMONARY: No increased WOB. Speaking in clear sentences.  PSYCH: Pleasant and cooperative. Speech normal rate and rhythm. Affect is appropriate. Insight and judgement are appropriate. Attention is focused, linear, and appropriate.  NEURO: Oriented as arrived to appointment on time with no prompting.   Lab Results  Component Value Date   CREATININE 0.77 12/18/2021   BUN 10 12/18/2021   NA 141 12/18/2021   K 4.3 12/18/2021   CL 101 12/18/2021   CO2 23 12/18/2021   Lab Results  Component Value Date   ALT 10 12/18/2021   AST 11 12/18/2021   ALKPHOS 74 12/18/2021   BILITOT <0.2 12/18/2021   Lab Results  Component Value Date   HGBA1C 5.4 12/18/2021   HGBA1C 4.9 06/11/2021   HGBA1C 4.8 12/13/2020   HGBA1C 5.2 07/30/2020   HGBA1C 5.0 03/11/2020   Lab Results  Component Value Date   INSULIN 16.6 12/18/2021   INSULIN 18.9 06/11/2021   INSULIN 21.0 07/30/2020   INSULIN 17.3 03/11/2020   INSULIN 13.1 09/01/2019   Lab Results  Component Value Date   TSH 2.310 12/13/2020    Lab Results  Component Value Date   CHOL 189 12/18/2021   HDL 45 12/18/2021   LDLCALC 120 (H) 12/18/2021   TRIG 135 12/18/2021   CHOLHDL 3.7 12/13/2020   Lab Results  Component Value Date   WBC 11.7 (H) 05/18/2022   HGB 9.1 (L) 05/18/2022   HCT 30.3 (L) 05/18/2022   MCV 63.7 (L) 05/18/2022   PLT 476 (H) 05/18/2022   Lab Results  Component Value Date   IRON 25 (L) 09/01/2019   TIBC 367 09/01/2019   FERRITIN 12 05/18/2022   Lab Results  Component Value Date   VD25OH 47.5 12/18/2021   VD25OH 51.4 06/11/2021   VD25OH 78.7 01/13/2021    Attestation Statements:   Reviewed  by clinician on day of visit: allergies, medications, problem list, medical history, surgical history, family history, social history, and previous encounter notes.

## 2022-11-10 ENCOUNTER — Telehealth (INDEPENDENT_AMBULATORY_CARE_PROVIDER_SITE_OTHER): Payer: No Typology Code available for payment source | Admitting: Family Medicine

## 2022-11-10 ENCOUNTER — Encounter (INDEPENDENT_AMBULATORY_CARE_PROVIDER_SITE_OTHER): Payer: Self-pay | Admitting: Family Medicine

## 2022-11-10 DIAGNOSIS — F3289 Other specified depressive episodes: Secondary | ICD-10-CM | POA: Diagnosis not present

## 2022-11-10 DIAGNOSIS — R7303 Prediabetes: Secondary | ICD-10-CM

## 2022-11-10 DIAGNOSIS — E669 Obesity, unspecified: Secondary | ICD-10-CM

## 2022-11-10 DIAGNOSIS — Z6841 Body Mass Index (BMI) 40.0 and over, adult: Secondary | ICD-10-CM

## 2022-11-10 DIAGNOSIS — I10 Essential (primary) hypertension: Secondary | ICD-10-CM

## 2022-11-10 MED ORDER — LISINOPRIL-HYDROCHLOROTHIAZIDE 10-12.5 MG PO TABS: 1.0000 | ORAL_TABLET | Freq: Every day | ORAL | 0 refills | Status: DC

## 2022-11-10 MED ORDER — METFORMIN HCL 500 MG PO TABS: 500.0000 mg | ORAL_TABLET | Freq: Three times a day (TID) | ORAL | 0 refills | Status: DC

## 2022-11-10 MED ORDER — BUPROPION HCL ER (SR) 200 MG PO TB12: 200.0000 mg | ORAL_TABLET | Freq: Two times a day (BID) | ORAL | 0 refills | Status: DC

## 2022-11-10 MED ORDER — TOPIRAMATE 100 MG PO TABS: 100.0000 mg | ORAL_TABLET | Freq: Every day | ORAL | 0 refills | Status: DC

## 2022-11-11 ENCOUNTER — Ambulatory Visit (AMBULATORY_SURGERY_CENTER): Payer: No Typology Code available for payment source | Admitting: *Deleted

## 2022-11-11 VITALS — Ht 62.0 in | Wt 235.0 lb

## 2022-11-11 DIAGNOSIS — Z1211 Encounter for screening for malignant neoplasm of colon: Secondary | ICD-10-CM

## 2022-11-11 MED ORDER — NA SULFATE-K SULFATE-MG SULF 17.5-3.13-1.6 GM/177ML PO SOLN: 1.0000 | Freq: Once | ORAL | 0 refills | Status: AC

## 2022-11-11 NOTE — Progress Notes (Signed)
No egg or soy allergy known to patient  No issues known to pt with past sedation with any surgeries or procedures Patient denies ever being told they had issues or difficulty with intubation  No FH of Malignant Hyperthermia Pt is not on diet pills Pt is not on  home 02  Pt is not on blood thinners  Pt denies issues with constipation  Pt is not on dialysis Pt denies any upcoming cardiac testing Pt encouraged to use to use Singlecare or Goodrx to reduce cost  Patient's chart reviewed by Osvaldo Angst CNRA prior to previsit and patient appropriate for the [place].  Previsit completed and red dot placed by patient's name on their procedure day (on provider's schedule).  . Pre-visit done by phone Information sent by mail and my chart

## 2022-11-12 NOTE — Telephone Encounter (Signed)
This was addressed with patient. We do not refill request from the pharmacy and usually not between visits.

## 2022-11-20 ENCOUNTER — Telehealth: Payer: Self-pay | Admitting: Gastroenterology

## 2022-11-20 ENCOUNTER — Other Ambulatory Visit: Payer: Self-pay

## 2022-11-20 DIAGNOSIS — Z1211 Encounter for screening for malignant neoplasm of colon: Secondary | ICD-10-CM

## 2022-11-20 MED ORDER — NA SULFATE-K SULFATE-MG SULF 17.5-3.13-1.6 GM/177ML PO SOLN: 1.0000 | Freq: Once | ORAL | 0 refills | Status: AC

## 2022-11-20 NOTE — Telephone Encounter (Signed)
Patient called, stated the [facility] said they did not accept the good RX coupon for her prep. Patient is requesting prep to be sent to [facility] on [address]  Thank you

## 2022-11-20 NOTE — Telephone Encounter (Signed)
Called patient back and sent suprep into [facility] on [address]. As requested by patient.

## 2022-11-21 ENCOUNTER — Ambulatory Visit
Admission: EM | Admit: 2022-11-21 | Discharge: 2022-11-21 | Disposition: A | Discharge status: Home / Self Care | Payer: No Typology Code available for payment source

## 2022-11-21 DIAGNOSIS — L84 Corns and callosities: Secondary | ICD-10-CM | POA: Diagnosis not present

## 2022-11-21 NOTE — ED Triage Notes (Signed)
Pt reports foot pain x 24 hours. States her foot split open and is bleeding.   States she had a hard spot on the crease of her toe and states it was painful.

## 2022-11-21 NOTE — ED Provider Notes (Signed)
[facility]    CSN: [id number] Arrival date & time: 11/21/22  1309      History   Chief Complaint Chief Complaint  Patient presents with   Foot Pain    HPI Heidi Robinson is a 47 y.o. female.   Patient presents with irritation to right foot.  Patient reports that she has a callus on her right fourth toe that has appeared to split open.  She is concerned for an infection and wants it evaluated.  Denies any injury to the area.  Denies numbness or tingling.  Denies any associated fever.  She has never been seen by podiatry.   Foot Pain    Past Medical History:  Diagnosis Date   Allergy    Anemia    Arthritis    Asthma    Fibroids    Hypertension    Iron deficiency anemia due to chronic blood loss    Obesity (BMI 30-39.9) 02/02/2012   Sickle cell trait ([place]) 06/04/2014   hemoglobin electrophoresis confirmed    Patient Active Problem List   Diagnosis Date Noted   At risk for impaired metabolic function 23/55/7322   Insulin resistance 04/09/2020   Depression 04/25/2019   Class 3 severe obesity with serious comorbidity and body mass index (BMI) of 40.0 to 44.9 in adult [facility]) 04/25/2019   Chronic bilateral thoracic back pain 09/16/2018   Neck pain 09/16/2018   Symptomatic mammary hypertrophy 09/16/2018   Other constipation 01/18/2018   Vitamin D deficiency 01/04/2018   Anemia 01/04/2018   Prediabetes 01/04/2018   Other fatigue 12/21/2017   Shortness of breath on exertion 12/21/2017   Essential hypertension 12/21/2017   Cough 11/06/2017   Mild intermittent asthma without complication 02/54/2706   Lower resp. tract infection 11/06/2017   Iron deficiency anemia due to chronic blood loss 08/03/2017   Pure hypercholesterolemia 08/03/2017   Sickle cell trait ([place]) 06/12/2014   Obesity (BMI 35.0-39.9 without comorbidity) 05/23/2013   HTN (hypertension) 02/02/2012    Past Surgical History:  Procedure Laterality Date   BREAST REDUCTION SURGERY Bilateral  11/03/2018   Procedure: BILATERAL BREAST REDUCTION WITH LIPOSUCTION;  Surgeon: Wallace Going, DO;  Location: [place];  Service: Plastics;  Laterality: Bilateral;   REDUCTION MAMMAPLASTY Bilateral 2019   tonsillecomy     TONSILLECTOMY  1997    OB History     Gravida  2   Para  2   Term  2   Preterm      AB      Living         SAB      IAB      Ectopic      Multiple      Live Births               Home Medications    Prior to Admission medications   Medication Sig Start Date End Date Taking? Authorizing Provider  albuterol (VENTOLIN HFA) 108 (90 Base) MCG/ACT inhaler Inhale 2 puffs into the lungs every 6 (six) hours as needed for wheezing or shortness of breath. 11/15/20   Posey Boyer, MD  buPROPion (WELLBUTRIN SR) 200 MG 12 hr tablet Take 1 tablet (200 mg total) by mouth 2 (two) times daily. 11/10/22   Whitmire, Joneen Boers, FNP  diclofenac (VOLTAREN) 50 MG EC tablet Take 50 mg by mouth 2 (two) times daily as needed. Patient not taking: Reported on 11/11/2022 05/11/22   [provider]  ferrous sulfate  325 (65 FE) MG tablet Take 1 tablet (325 mg total) by mouth daily with breakfast. 09/14/22   Dennard Nip D, MD  lisinopril-hydrochlorothiazide (ZESTORETIC) 10-12.5 MG tablet Take 1 tablet by mouth daily. Take 1 tablet by mouth daily. 11/10/22   Whitmire, Joneen Boers, FNP  meloxicam (MOBIC) 15 MG tablet Take 1 tablet (15 mg total) by mouth daily as needed for pain. Patient not taking: Reported on 11/11/2022 10/23/19   Forrest Moron, MD  metFORMIN (GLUCOPHAGE) 500 MG tablet Take 1 tablet (500 mg total) by mouth 3 (three) times daily. 11/10/22   Whitmire, Joneen Boers, FNP  Multiple Vitamin (MULTIVITAMINS PO) Take by mouth.    [provider]  polyethylene glycol powder (GLYCOLAX/MIRALAX) 17 GM/SCOOP powder Take 17 g by mouth daily. 09/14/22   Dennard Nip D, MD  topiramate (TOPAMAX) 100 MG tablet Take 1 tablet (100 mg total) by mouth at bedtime.  11/10/22   Whitmire, Joneen Boers, FNP    Family History Family History  Problem Relation Age of Onset   Colon polyps Mother    Hypertension Mother    Anxiety disorder Mother    Obesity Mother    Hyperlipidemia Father    Kidney disease Brother    Hypertension Maternal Grandmother    Cancer Maternal Grandmother        bladder   Cancer Paternal Grandfather        prostate   Breast cancer Neg Hx    Colon cancer Neg Hx    Esophageal cancer Neg Hx    Stomach cancer Neg Hx    Rectal cancer Neg Hx     Social History Social History   Tobacco Use   Smoking status: Former    Years: 2.00    Types: Cigarettes    Quit date: 07/11/2003    Years since quitting: 19.3   Smokeless tobacco: Never  Vaping Use   Vaping Use: Never used  Substance Use Topics   Alcohol use: No    Alcohol/week: 0.0 standard drinks of alcohol   Drug use: No     Allergies   Penicillins, Food, Latex, and Penicillin g   Review of Systems Review of Systems Per HPI  Physical Exam Triage Vital Signs ED Triage Vitals  Enc Vitals Group     BP 11/21/22 1413 119/79     Pulse Rate 11/21/22 1413 100     Resp 11/21/22 1413 18     Temp 11/21/22 1413 98.7 F (37.1 C)     Temp Source 11/21/22 1413 Oral     SpO2 11/21/22 1413 98 %     Weight --      Height --      Head Circumference --      Peak Flow --      Pain Score 11/21/22 1412 0     Pain Loc --      Pain Edu? --      Excl. in [place]? --    No data found.  Updated Vital Signs BP 119/79 (BP Location: Left Arm)   Pulse 100   Temp 98.7 F (37.1 C) (Oral)   Resp 18   LMP 10/31/2022 (Exact Date)   SpO2 98%   Visual Acuity Right Eye Distance:   Left Eye Distance:   Bilateral Distance:    Right Eye Near:   Left Eye Near:    Bilateral Near:     Physical Exam Constitutional:      General: She is not in acute distress.  Appearance: Normal appearance. She is not toxic-appearing or diaphoretic.  HENT:     Head: Normocephalic and atraumatic.  Eyes:      Extraocular Movements: Extraocular movements intact.     Conjunctiva/sclera: Conjunctivae normal.  Pulmonary:     Effort: Pulmonary effort is normal.  Musculoskeletal:       Feet:  Feet:     Comments: Small callus present to bottom of right fourth toe.  At the bend of the toe, callus has split open.  There is no bleeding or purulent drainage.  No surrounding erythema or swelling.  Patient has full range of motion of toe.  Capillary refill and pulses intact. There does not appear to be any deep opening of the skin.  Neurological:     General: No focal deficit present.     Mental Status: She is alert and oriented to person, place, and time. Mental status is at baseline.  Psychiatric:        Mood and Affect: Mood normal.        Behavior: Behavior normal.        Thought Content: Thought content normal.        Judgment: Judgment normal.      UC Treatments / Results  Labs (all labs ordered are listed, but only abnormal results are displayed) Labs Reviewed - No data to display  EKG   Radiology No results found.  Procedures Procedures (including critical care time)  Medications Ordered in UC Medications - No data to display  Initial Impression / Assessment and Plan / UC Course  I have reviewed the triage vital signs and the nursing notes.  Pertinent labs & imaging results that were available during my care of the patient were reviewed by me and considered in my medical decision making (see chart for details).     Patient has callus that appears to have opened.  There are no signs of infection or bleeding at this time.  Patient advised to keep covered and to follow-up with podiatrist at provided contact information for further evaluation and management as soon as possible.  Patient verbalized understanding and was agreeable with plan. Final Clinical Impressions(s) / UC Diagnoses   Final diagnoses:  Foot callus     Discharge Instructions      Please keep covered  until otherwise advised.  Follow-up with foot doctor on Monday to schedule appointment for further evaluation and management.    ED Prescriptions   None    PDMP not reviewed this encounter.   Teodora Medici, [place] 11/21/22 [phone]

## 2022-11-21 NOTE — Discharge Instructions (Signed)
Please keep covered until otherwise advised.  Follow-up with foot doctor on Monday to schedule appointment for further evaluation and management.

## 2022-11-30 ENCOUNTER — Encounter: Payer: Self-pay | Admitting: Obstetrics and Gynecology

## 2022-11-30 ENCOUNTER — Encounter: Payer: Self-pay | Admitting: Gastroenterology

## 2022-12-01 ENCOUNTER — Ambulatory Visit (INDEPENDENT_AMBULATORY_CARE_PROVIDER_SITE_OTHER): Payer: No Typology Code available for payment source | Admitting: Podiatry

## 2022-12-01 ENCOUNTER — Ambulatory Visit: Payer: No Typology Code available for payment source | Admitting: Gastroenterology

## 2022-12-01 ENCOUNTER — Encounter: Payer: Self-pay | Admitting: Gastroenterology

## 2022-12-01 ENCOUNTER — Encounter: Payer: Self-pay | Admitting: Podiatry

## 2022-12-01 VITALS — BP 121/74 | HR 80 | Temp 96.6°F | Resp 12 | Ht 62.0 in | Wt 235.0 lb

## 2022-12-01 DIAGNOSIS — B070 Plantar wart: Secondary | ICD-10-CM

## 2022-12-01 DIAGNOSIS — K635 Polyp of colon: Secondary | ICD-10-CM

## 2022-12-01 DIAGNOSIS — Z1211 Encounter for screening for malignant neoplasm of colon: Secondary | ICD-10-CM | POA: Diagnosis not present

## 2022-12-01 DIAGNOSIS — D125 Benign neoplasm of sigmoid colon: Secondary | ICD-10-CM

## 2022-12-01 MED ORDER — SODIUM CHLORIDE 0.9 % IV SOLN: 500.0000 mL | Freq: Once | INTRAVENOUS | Status: DC

## 2022-12-01 MED ORDER — FLUOROURACIL 5 % EX CREA: TOPICAL_CREAM | Freq: Two times a day (BID) | CUTANEOUS | 1 refills | Status: AC

## 2022-12-01 NOTE — Patient Instructions (Signed)
Thank you for letting us take care of your healthcare needs today. PLease see handouts given to you on Polyps and Diverticulosis.    YOU HAD AN ENDOSCOPIC PROCEDURE TODAY AT [facility]:   Refer to the procedure report that was given to you for any specific questions about what was found during the examination.  If the procedure report does not answer your questions, please call your gastroenterologist to clarify.  If you requested that your care partner not be given the details of your procedure findings, then the procedure report has been included in a sealed envelope for you to review at your convenience later.  YOU SHOULD EXPECT: Some feelings of bloating in the abdomen. Passage of more gas than usual.  Walking can help get rid of the air that was put into your GI tract during the procedure and reduce the bloating. If you had a lower endoscopy (such as a colonoscopy or flexible sigmoidoscopy) you may notice spotting of blood in your stool or on the toilet paper. If you underwent a bowel prep for your procedure, you may not have a normal bowel movement for a few days.  Please Note:  You might notice some irritation and congestion in your nose or some drainage.  This is from the oxygen used during your procedure.  There is no need for concern and it should clear up in a day or so.  SYMPTOMS TO REPORT IMMEDIATELY:  Following lower endoscopy (colonoscopy or flexible sigmoidoscopy):  Excessive amounts of blood in the stool  Significant tenderness or worsening of abdominal pains  Swelling of the abdomen that is new, acute  Fever of 100F or higher    For urgent or emergent issues, a gastroenterologist can be reached at any hour by calling [phone]. Do not use MyChart messaging for urgent concerns.    DIET:  We do recommend a small meal at first, but then you may proceed to your regular diet.  Drink plenty of fluids but you should avoid alcoholic beverages for 24  hours.  ACTIVITY:  You should plan to take it easy for the rest of today and you should NOT DRIVE or use heavy machinery until tomorrow (because of the sedation medicines used during the test).    FOLLOW UP: Our staff will call the number listed on your records the next business day following your procedure.  We will call around 7:15- 8:00 am to check on you and address any questions or concerns that you may have regarding the information given to you following your procedure. If we do not reach you, we will leave a message.     If any biopsies were taken you will be contacted by phone or by letter within the next 1-3 weeks.  Please call us at [phone] if you have not heard about the biopsies in 3 weeks.    SIGNATURES/CONFIDENTIALITY: You and/or your care partner have signed paperwork which will be entered into your electronic medical record.  These signatures attest to the fact that that the information above on your After Visit Summary has been reviewed and is understood.  Full responsibility of the confidentiality of this discharge information lies with you and/or your care-partner.

## 2022-12-01 NOTE — Progress Notes (Signed)
Subjective:  Patient ID: Heidi Robinson, female    DOB: [birth year],  MRN: [id number] HPI Chief Complaint  Patient presents with   Toe Pain    4th toe plantar right - callused area x 6 months, thinks it cracked open initially and now its very hard and thick and won't go away, Seen Urgent Care-recommended visit here, using peroxide, neosporin and bandaids at home   Diabetes    "Pre-diabetic"   New Patient (Initial Visit)    47 y.o. female presents with the above complaint.   ROS: Denies fever chills nausea mobic muscle aches pains calf pain back pain chest pain shortness of breath.  Past Medical History:  Diagnosis Date   Allergy    Anemia    Arthritis    Asthma    Fibroids    Hypertension    Iron deficiency anemia due to chronic blood loss    Obesity (BMI 30-39.9) 02/02/2012   Sickle cell trait ([place]) 06/04/2014   hemoglobin electrophoresis confirmed   Past Surgical History:  Procedure Laterality Date   BREAST REDUCTION SURGERY Bilateral 11/03/2018   Procedure: BILATERAL BREAST REDUCTION WITH LIPOSUCTION;  Surgeon: Wallace Going, DO;  Location: [facility];  Service: Plastics;  Laterality: Bilateral;   REDUCTION MAMMAPLASTY Bilateral 2019   tonsillecomy     TONSILLECTOMY  1997    Current Outpatient Medications:    fluorouracil (EFUDEX) 5 % cream, Apply topically 2 (two) times daily., Disp: 40 g, Rfl: 1   albuterol (VENTOLIN HFA) 108 (90 Base) MCG/ACT inhaler, Inhale 2 puffs into the lungs every 6 (six) hours as needed for wheezing or shortness of breath., Disp: 18 g, Rfl: 3   buPROPion (WELLBUTRIN SR) 200 MG 12 hr tablet, Take 1 tablet (200 mg total) by mouth 2 (two) times daily., Disp: 60 tablet, Rfl: 0   ferrous sulfate 325 (65 FE) MG tablet, Take 1 tablet (325 mg total) by mouth daily with breakfast., Disp: 30 tablet, Rfl: 0   lisinopril-hydrochlorothiazide (ZESTORETIC) 10-12.5 MG tablet, Take 1 tablet by mouth daily. Take 1 tablet by mouth daily., Disp:  30 tablet, Rfl: 0   metFORMIN (GLUCOPHAGE) 500 MG tablet, Take 1 tablet (500 mg total) by mouth 3 (three) times daily., Disp: 90 tablet, Rfl: 0   Multiple Vitamin (MULTIVITAMINS PO), Take by mouth., Disp: , Rfl:    topiramate (TOPAMAX) 100 MG tablet, Take 1 tablet (100 mg total) by mouth at bedtime., Disp: 30 tablet, Rfl: 0  Allergies  Allergen Reactions   Penicillins Anaphylaxis   Food     Fish, Walnuts, Cashews, Almonds, Pecans, Blueberries, Pine nuts, Pistachios, Macadamia Nuts, Coconuts, Chestnuts   Latex     "Rash, hives maybe... I don't really remember"   Penicillin G Other (See Comments)   Review of Systems Objective:  There were no vitals filed for this visit.  General: Well developed, nourished, in no acute distress, alert and oriented x3   Dermatological: Skin is warm, dry and supple bilateral. Nails x 10 are well maintained; remaining integument appears unremarkable at this time. There are no open sores, no preulcerative lesions, no rash or signs of infection present.  Plantar aspect of the fourth digit at the level of the PIPJ right foot demonstrates a reactive hyper keratoma once debrided demonstrates findings suggestive of verrucoid type tissue.  Thrombosed capillaries are visible and skin lines are circumventing the lesion.  No bleeding was induced today.  Vascular: Dorsalis Pedis artery and Posterior Tibial artery pedal pulses are 2/4  bilateral with immedate capillary fill time. Pedal hair growth present. No varicosities and no lower extremity edema present bilateral.   Neruologic: Grossly intact via light touch bilateral. Vibratory intact via tuning fork bilateral. Protective threshold with Semmes Wienstein monofilament intact to all pedal sites bilateral. Patellar and Achilles deep tendon reflexes 2+ bilateral. No Babinski or clonus noted bilateral.   Musculoskeletal: No gross boney pedal deformities bilateral. No pain, crepitus, or limitation noted with foot and ankle  range of motion bilateral. Muscular strength 5/5 in all groups tested bilateral.  Gait: Unassisted, Nonantalgic.    Radiographs:  None taken   Assessment & Plan:   Assessment: Verruca plantaris plantar PIPJ fourth digit right foot  Plan: Start Efudex cream apply small amount under occlusion once daily follow-up with her in 6 to 8 weeks.  Call with questions or concerns.     Heidi Eber T. [place], [place]

## 2022-12-01 NOTE — Progress Notes (Signed)
[facility] History and Physical   Primary Care Physician:  Patient, No Pcp Per   Reason for Procedure:   Colon cancer screening  Plan:    Screening colonoscopy     HPI: KADEJAH SANDIFORD is a 47 y.o. female undergoing initial average risk screening colonoscopy.  She has no family history of colon cancer and no chronic GI symptoms.    Past Medical History:  Diagnosis Date   Allergy    Anemia    Arthritis    Asthma    Fibroids    Hypertension    Iron deficiency anemia due to chronic blood loss    Obesity (BMI 30-39.9) 02/02/2012   Sickle cell trait ([place]) 06/04/2014   hemoglobin electrophoresis confirmed    Past Surgical History:  Procedure Laterality Date   BREAST REDUCTION SURGERY Bilateral 11/03/2018   Procedure: BILATERAL BREAST REDUCTION WITH LIPOSUCTION;  Surgeon: Wallace Going, DO;  Location: [facility];  Service: Plastics;  Laterality: Bilateral;   REDUCTION MAMMAPLASTY Bilateral 2019   tonsillecomy     TONSILLECTOMY  1997    Prior to Admission medications   Medication Sig Start Date End Date Taking? Authorizing Provider  ferrous sulfate 325 (65 FE) MG tablet Take 1 tablet (325 mg total) by mouth daily with breakfast. 09/14/22  Yes Beasley, Caren D, MD  lisinopril-hydrochlorothiazide (ZESTORETIC) 10-12.5 MG tablet Take 1 tablet by mouth daily. Take 1 tablet by mouth daily. 11/10/22  Yes Whitmire, Dawn W, FNP  metFORMIN (GLUCOPHAGE) 500 MG tablet Take 1 tablet (500 mg total) by mouth 3 (three) times daily. 11/10/22  Yes Whitmire, Dawn W, FNP  albuterol (VENTOLIN HFA) 108 (90 Base) MCG/ACT inhaler Inhale 2 puffs into the lungs every 6 (six) hours as needed for wheezing or shortness of breath. 11/15/20   Posey Boyer, MD  buPROPion (WELLBUTRIN SR) 200 MG 12 hr tablet Take 1 tablet (200 mg total) by mouth 2 (two) times daily. 11/10/22   Whitmire, Joneen Boers, FNP  diclofenac (VOLTAREN) 50 MG EC tablet Take 50 mg by mouth 2 (two) times daily as  needed. Patient not taking: Reported on 11/11/2022 05/11/22   [provider]  Multiple Vitamin (MULTIVITAMINS PO) Take by mouth.    [provider]  topiramate (TOPAMAX) 100 MG tablet Take 1 tablet (100 mg total) by mouth at bedtime. 11/10/22   Whitmire, Joneen Boers, FNP    Current Outpatient Medications  Medication Sig Dispense Refill   ferrous sulfate 325 (65 FE) MG tablet Take 1 tablet (325 mg total) by mouth daily with breakfast. 30 tablet 0   lisinopril-hydrochlorothiazide (ZESTORETIC) 10-12.5 MG tablet Take 1 tablet by mouth daily. Take 1 tablet by mouth daily. 30 tablet 0   metFORMIN (GLUCOPHAGE) 500 MG tablet Take 1 tablet (500 mg total) by mouth 3 (three) times daily. 90 tablet 0   albuterol (VENTOLIN HFA) 108 (90 Base) MCG/ACT inhaler Inhale 2 puffs into the lungs every 6 (six) hours as needed for wheezing or shortness of breath. 18 g 3   buPROPion (WELLBUTRIN SR) 200 MG 12 hr tablet Take 1 tablet (200 mg total) by mouth 2 (two) times daily. 60 tablet 0   diclofenac (VOLTAREN) 50 MG EC tablet Take 50 mg by mouth 2 (two) times daily as needed. (Patient not taking: Reported on 11/11/2022)     Multiple Vitamin (MULTIVITAMINS PO) Take by mouth.     topiramate (TOPAMAX) 100 MG tablet Take 1 tablet (100 mg total) by mouth at bedtime. [place]  tablet 0   Current Facility-Administered Medications  Medication Dose Route Frequency Provider Last Rate Last Admin   0.9 %  sodium chloride infusion  500 mL Intravenous Once Daryel November, MD        Allergies as of 12/01/2022 - Review Complete 12/01/2022  Allergen Reaction Noted   Penicillins Anaphylaxis    Food  12/21/2017   Latex  11/03/2018   Penicillin g Other (See Comments) 11/11/2022    Family History  Problem Relation Age of Onset   Colon polyps Mother    Hypertension Mother    Anxiety disorder Mother    Obesity Mother    Hyperlipidemia Father    Kidney disease Brother    Hypertension Maternal Grandmother    Cancer  Maternal Grandmother        bladder   Cancer Paternal Grandfather        prostate   Breast cancer Neg Hx    Colon cancer Neg Hx    Esophageal cancer Neg Hx    Stomach cancer Neg Hx    Rectal cancer Neg Hx     Social History   Socioeconomic History   Marital status: Married    Spouse name: Marya Amsler   Number of children: Not on file   Years of education: Not on file   Highest education level: Not on file  Occupational History   Occupation: Energy manager   Tobacco Use   Smoking status: Former    Years: 2.00    Types: Cigarettes    Quit date: 07/11/2003    Years since quitting: 19.4   Smokeless tobacco: Never  Vaping Use   Vaping Use: Never used  Substance and Sexual Activity   Alcohol use: No    Alcohol/week: 0.0 standard drinks of alcohol   Drug use: No   Sexual activity: Yes    Partners: Male  Other Topics Concern   Not on file  Social History Narrative   Married with 2 children. Patient has a Financial risk analyst. Exercise: Cardio 2-3 times a week for 30 minutes.   Social Determinants of Health   Financial Resource Strain: Not on file  Food Insecurity: Not on file  Transportation Needs: Not on file  Physical Activity: Not on file  Stress: Not on file  Social Connections: Not on file  Intimate Partner Violence: Not on file    Review of Systems:  All other review of systems negative except as mentioned in the HPI.  Physical Exam: Vital signs BP 138/84   Pulse 96   Temp (!) 96.6 F (35.9 C)   Ht '5\' 2"'$  (1.575 m)   Wt 235 lb (106.6 kg)   LMP 11/26/2022 (Exact Date) Comment: no chance of pregnancy per pt ,finishing menstrual period now  SpO2 98%   BMI 42.98 kg/m   General:   Alert,  Well-developed, well-nourished, pleasant and cooperative in NAD Airway:  Mallampati 3 Lungs:  Clear throughout to auscultation.   Heart:  Regular rate and rhythm; no murmurs, clicks, rubs,  or gallops. Abdomen:  Soft, nontender and nondistended. Normal bowel sounds.    Neuro/Psych:  Normal mood and affect. A and O x 3   Aneesh Faller E. Candis Schatz, MD [facility]

## 2022-12-01 NOTE — Progress Notes (Signed)
Called to room to assist during endoscopic procedure.  Patient ID and intended procedure confirmed with present staff. Received instructions for my participation in the procedure from the performing physician.  

## 2022-12-01 NOTE — Op Note (Signed)
[facility] Patient Name: Heidi Robinson Procedure Date: 12/01/2022 9:26 AM MRN: [id number] Endoscopist: Nicki Reaper E. Candis Schatz , MD, [id number] Age: 47 Referring MD:  Date of Birth: 1976/01/23 Gender: Female Account #: [id number] Procedure:                Colonoscopy Indications:              Screening for colorectal malignant neoplasm, This                            is the patient's first colonoscopy Medicines:                Monitored Anesthesia Care Procedure:                Pre-Anesthesia Assessment:                           - Prior to the procedure, a History and Physical                            was performed, and patient medications and                            allergies were reviewed. The patient's tolerance of                            previous anesthesia was also reviewed. The risks                            and benefits of the procedure and the sedation                            options and risks were discussed with the patient.                            All questions were answered, and informed consent                            was obtained. Prior Anticoagulants: The patient has                            taken no anticoagulant or antiplatelet agents. ASA                            Grade Assessment: II - A patient with mild systemic                            disease. After reviewing the risks and benefits,                            the patient was deemed in satisfactory condition to                            undergo the procedure.  After obtaining informed consent, the colonoscope                            was passed under direct vision. Throughout the                            procedure, the patient's blood pressure, pulse, and                            oxygen saturations were monitored continuously. The                            CF [id number] #[id number] was introduced through the anus                            and advanced to the  the terminal ileum, with                            identification of the appendiceal orifice and IC                            valve. The colonoscopy was performed without                            difficulty. The patient tolerated the procedure                            well. The quality of the bowel preparation was                            excellent. The terminal ileum, ileocecal valve,                            appendiceal orifice, and rectum were photographed.                            The bowel preparation used was SUPREP via split                            dose instruction. Scope In: 9:38:37 AM Scope Out: 9:50:01 AM Scope Withdrawal Time: 0 hours 8 minutes 31 seconds  Total Procedure Duration: 0 hours 11 minutes 24 seconds  Findings:                 The perianal and digital rectal examinations were                            normal. Pertinent negatives include normal                            sphincter tone and no palpable rectal lesions.                           Two sessile polyps were found in the rectum/sigmoid  colon. The polyps were 2 to 6 mm in size. These                            polyps were removed with a cold snare. Resection                            and retrieval were complete. Estimated blood loss                            was minimal.                           A few small-mouthed diverticula were found in the                            sigmoid colon and descending colon.                           The exam was otherwise normal throughout the                            examined colon.                           The terminal ileum appeared normal.                           The retroflexed view of the distal rectum and anal                            verge was normal and showed no anal or rectal                            abnormalities. Complications:            No immediate complications. Estimated Blood Loss:     Estimated blood  loss was minimal. Impression:               - Two 2 to 6 mm polyps in the rectum/sigmoid colon,                            removed with a cold snare. Resected and retrieved.                           - Diverticulosis in the sigmoid colon and in the                            descending colon.                           - The examined portion of the ileum was normal.                           - The distal rectum and anal verge are normal on  retroflexion view. Recommendation:           - Patient has a contact number available for                            emergencies. The signs and symptoms of potential                            delayed complications were discussed with the                            patient. Return to normal activities tomorrow.                            Written discharge instructions were provided to the                            patient.                           - Resume previous diet.                           - Continue present medications.                           - Await pathology results.                           - Repeat colonoscopy (date not yet determined) for                            surveillance based on pathology results. Dorthey Depace E. Candis Schatz, MD 12/01/2022 9:58:10 AM This report has been signed electronically.

## 2022-12-01 NOTE — Progress Notes (Signed)
To pacu, VSS. Report to RN.tb 

## 2022-12-01 NOTE — Progress Notes (Signed)
Pt's states no medical or surgical changes since previsit or office visit. 

## 2022-12-02 ENCOUNTER — Telehealth: Payer: Self-pay | Admitting: *Deleted

## 2022-12-02 NOTE — Telephone Encounter (Signed)
  Follow up Call-     12/01/2022    8:57 AM  Call back number  Post procedure Call Back phone  # [phone]  Permission to leave phone message Yes     Patient questions:   Message left to call sus if necessary.

## 2022-12-08 ENCOUNTER — Ambulatory Visit (INDEPENDENT_AMBULATORY_CARE_PROVIDER_SITE_OTHER): Payer: No Typology Code available for payment source | Admitting: Family Medicine

## 2022-12-10 NOTE — Progress Notes (Signed)
Heidi Robinson,  Good news: the polyps that I removed during your recent examination were NOT precancerous.  You should continue to follow current colorectal cancer screening guidelines with a repeat colonoscopy in 10 years.    If you develop any new rectal bleeding, abdominal pain or significant bowel habit changes, please contact me before then.

## 2022-12-14 ENCOUNTER — Ambulatory Visit: Payer: No Typology Code available for payment source

## 2023-01-05 NOTE — Progress Notes (Unsigned)
  TeleHealth Visit:  This visit was completed with telemedicine (audio/video) technology. Heidi Robinson has verbally consented to this TeleHealth visit. The patient is located at home, the provider is located at home. The participants in this visit include the listed provider and patient. The visit was conducted today via MyChart video.  OBESITY Heidi Robinson is here to discuss her progress with her obesity treatment plan along with follow-up of her obesity related diagnoses.   Today's visit was # 64 Starting weight: 258 lbs Starting date: 12/21/2017 Weight at last in office visit: 237 lbs on 09/14/22 Total weight loss: 21 lbs at last in office visit on 09/14/22. Today's reported weight: *** lbs No weight reported.  as # 48 Starting weight: 258 lbs Starting date: 12/21/2017 Weight at last in office visit: 237 lbs on 09/14/22 Total weight loss: 21 lbs at last in office visit on 09/14/22. Today's reported weight:  No weight reported.   Nutrition Plan: the Category 3 Plan   Current exercise:   *** Hand weights 3-4 days per week   Interim History:  *** Pharmacotherapy: Heidi Robinson is on {dwwglp:29109}. Adverse side effects: {dwwse:29122} Hunger is {EWCONTROLASSESSMENT:24261}.  Assessment/Plan:  1. ***  2. ***  3. ***  {dwwmorbid:29108::"Morbid Obesity"}: Current BMI *** Pharmacotherapy Plan {dwwmed:29123} {dwwglp:29109}.  Heidi Robinson {CHL AMB IS/IS NOT:210130109} currently in the action stage of change. As such, her goal is to {MWMwtloss#1:210800005}.  She has agreed to {dwwsldiets:29085}.  Exercise goals: {MWM EXERCISE RECS:23473}  Behavioral modification strategies: {dwwslwtlossstrategies:29088}.  Heidi Robinson has agreed to follow-up with our clinic in {NUMBER 1-10:22536} weeks.   No orders of the defined types were placed in this encounter.   There are no discontinued medications.   No orders of the defined types were placed in this encounter.     Objective:   VITALS: Per patient if  applicable, see vitals. GENERAL: Alert and in no acute distress. CARDIOPULMONARY: No increased WOB. Speaking in clear sentences.  PSYCH: Pleasant and cooperative. Speech normal rate and rhythm. Affect is appropriate. Insight and judgement are appropriate. Attention is focused, linear, and appropriate.  NEURO: Oriented as arrived to appointment on time with no prompting.   Attestation Statements:   Reviewed by clinician on day of visit: allergies, medications, problem list, medical history, surgical history, family history, social history, and previous encounter notes.  ***(delete if time-based billing not used) Time spent on visit including the items listed below was *** minutes.  -preparing to see the patient (e.g., review of tests, history, previous notes) -obtaining and/or reviewing separately obtained history -counseling and educating the patient/family/caregiver -documenting clinical information in the electronic or other health record -ordering medications, tests, or procedures -independently interpreting results and communicating results to the patient/ family/caregiver -referring and communicating with other health care professionals  -care coordination   This was prepared with the assistance of Presenter, broadcasting.  Occasional wrong-word or sound-a-like substitutions may have occurred due to the inherent limitations of voice recognition software.

## 2023-01-06 ENCOUNTER — Telehealth (INDEPENDENT_AMBULATORY_CARE_PROVIDER_SITE_OTHER): Payer: No Typology Code available for payment source | Admitting: Family Medicine

## 2023-01-06 ENCOUNTER — Encounter (INDEPENDENT_AMBULATORY_CARE_PROVIDER_SITE_OTHER): Payer: Self-pay | Admitting: Family Medicine

## 2023-01-06 DIAGNOSIS — I10 Essential (primary) hypertension: Secondary | ICD-10-CM | POA: Diagnosis not present

## 2023-01-06 DIAGNOSIS — R7303 Prediabetes: Secondary | ICD-10-CM

## 2023-01-06 DIAGNOSIS — F3289 Other specified depressive episodes: Secondary | ICD-10-CM

## 2023-01-06 DIAGNOSIS — Z6841 Body Mass Index (BMI) 40.0 and over, adult: Secondary | ICD-10-CM

## 2023-01-06 MED ORDER — LISINOPRIL-HYDROCHLOROTHIAZIDE 10-12.5 MG PO TABS: 1.0000 | ORAL_TABLET | Freq: Every day | ORAL | 0 refills | Status: DC

## 2023-01-06 MED ORDER — TOPIRAMATE 100 MG PO TABS: 100.0000 mg | ORAL_TABLET | Freq: Every day | ORAL | 0 refills | Status: DC

## 2023-01-06 MED ORDER — BUPROPION HCL ER (SR) 200 MG PO TB12: 200.0000 mg | ORAL_TABLET | Freq: Two times a day (BID) | ORAL | 0 refills | Status: DC

## 2023-01-06 MED ORDER — METFORMIN HCL 500 MG PO TABS: 500.0000 mg | ORAL_TABLET | Freq: Three times a day (TID) | ORAL | 0 refills | Status: DC

## 2023-01-21 ENCOUNTER — Ambulatory Visit
Admission: RE | Admit: 2023-01-21 | Discharge: 2023-01-21 | Disposition: A | Discharge status: Home / Self Care | Payer: No Typology Code available for payment source | Source: Ambulatory Visit | Attending: Obstetrics and Gynecology | Admitting: Obstetrics and Gynecology

## 2023-01-21 DIAGNOSIS — Z1231 Encounter for screening mammogram for malignant neoplasm of breast: Secondary | ICD-10-CM

## 2023-02-11 ENCOUNTER — Ambulatory Visit (INDEPENDENT_AMBULATORY_CARE_PROVIDER_SITE_OTHER): Payer: No Typology Code available for payment source | Admitting: Family Medicine

## 2023-02-11 ENCOUNTER — Encounter (INDEPENDENT_AMBULATORY_CARE_PROVIDER_SITE_OTHER): Payer: Self-pay | Admitting: Family Medicine

## 2023-02-11 VITALS — BP 109/70 | HR 69 | Temp 97.5°F | Ht 62.0 in | Wt 237.0 lb

## 2023-02-11 DIAGNOSIS — I10 Essential (primary) hypertension: Secondary | ICD-10-CM

## 2023-02-11 DIAGNOSIS — F3289 Other specified depressive episodes: Secondary | ICD-10-CM

## 2023-02-11 DIAGNOSIS — E7800 Pure hypercholesterolemia, unspecified: Secondary | ICD-10-CM

## 2023-02-11 DIAGNOSIS — D508 Other iron deficiency anemias: Secondary | ICD-10-CM | POA: Diagnosis not present

## 2023-02-11 DIAGNOSIS — R7303 Prediabetes: Secondary | ICD-10-CM | POA: Diagnosis not present

## 2023-02-11 DIAGNOSIS — E559 Vitamin D deficiency, unspecified: Secondary | ICD-10-CM

## 2023-02-11 DIAGNOSIS — E669 Obesity, unspecified: Secondary | ICD-10-CM

## 2023-02-11 DIAGNOSIS — Z6841 Body Mass Index (BMI) 40.0 and over, adult: Secondary | ICD-10-CM

## 2023-02-11 MED ORDER — FERROUS SULFATE 325 (65 FE) MG PO TABS: 325.0000 mg | ORAL_TABLET | Freq: Every day | ORAL | 0 refills | Status: DC

## 2023-02-11 MED ORDER — LISINOPRIL-HYDROCHLOROTHIAZIDE 10-12.5 MG PO TABS: 1.0000 | ORAL_TABLET | Freq: Every day | ORAL | 0 refills | Status: DC

## 2023-02-11 MED ORDER — BUPROPION HCL ER (SR) 200 MG PO TB12: 200.0000 mg | ORAL_TABLET | Freq: Two times a day (BID) | ORAL | 0 refills | Status: DC

## 2023-02-11 MED ORDER — METFORMIN HCL 500 MG PO TABS: 500.0000 mg | ORAL_TABLET | Freq: Three times a day (TID) | ORAL | 0 refills | Status: DC

## 2023-02-11 MED ORDER — TOPIRAMATE 100 MG PO TABS: 100.0000 mg | ORAL_TABLET | Freq: Every day | ORAL | 0 refills | Status: DC

## 2023-02-12 LAB — CMP14+EGFR
ALT: 6 IU/L (ref 0–32)
AST: 10 IU/L (ref 0–40)
Albumin/Globulin Ratio: 1.6 (ref 1.2–2.2)
Albumin: 4.4 g/dL (ref 3.9–4.9)
Alkaline Phosphatase: 90 IU/L (ref 44–121)
BUN/Creatinine Ratio: 10 (ref 9–23)
BUN: 8 mg/dL (ref 6–24)
Bilirubin Total: <0.2 mg/dL (ref 0.0–1.2)
CO2: 19 mmol/L — ABNORMAL LOW (ref 20–29)
Calcium: 9.5 mg/dL (ref 8.7–10.2)
Chloride: 101 mmol/L (ref 96–106)
Creatinine, Ser: 0.77 mg/dL (ref 0.57–1.00)
Globulin, Total: 2.7 g/dL (ref 1.5–4.5)
Glucose: 91 mg/dL (ref 70–99)
Potassium: 4.1 mmol/L (ref 3.5–5.2)
Sodium: 137 mmol/L (ref 134–144)
Total Protein: 7.1 g/dL (ref 6.0–8.5)
eGFR: 96 mL/min/{1.73_m2} (ref >59)

## 2023-02-12 LAB — VITAMIN B12: Vitamin B-12: 562 pg/mL (ref 232–1245)

## 2023-02-12 LAB — VITAMIN D 25 HYDROXY (VIT D DEFICIENCY, FRACTURES): Vit D, 25-Hydroxy: 40.8 ng/mL (ref 30.0–100.0)

## 2023-02-12 LAB — INSULIN, RANDOM: INSULIN: 18.1 u[IU]/mL (ref 2.6–24.9)

## 2023-02-12 LAB — LIPID PANEL WITH LDL/HDL RATIO
Cholesterol, Total: 152 mg/dL (ref 100–199)
HDL: 40 mg/dL (ref >39)
LDL Chol Calc (NIH): 95 mg/dL (ref 0–99)
LDL/HDL Ratio: 2.4 ratio (ref 0.0–3.2)
Triglycerides: 92 mg/dL (ref 0–149)
VLDL Cholesterol Cal: 17 mg/dL (ref 5–40)

## 2023-02-12 LAB — HEMOGLOBIN A1C
Est. average glucose Bld gHb Est-mCnc: 100 mg/dL
Hgb A1c MFr Bld: 5.1 % (ref 4.8–5.6)

## 2023-02-15 NOTE — Progress Notes (Unsigned)
Chief Complaint:   OBESITY Heidi Robinson is here to discuss her progress with her obesity treatment plan along with follow-up of her obesity related diagnoses. Heidi Robinson is on the Category 3 Plan and states she is following her eating plan approximately 0% of the time. Heidi Robinson states she is doing free weights 4-5 times per week.    Today's visit was #: 68 Starting weight: 258 lbs Starting date: 12/21/2017 Today's weight: 237 lbs Today's date: 02/11/2023 Total lbs lost to date: 21 Total lbs lost since last in-office visit: 0  Interim History: Heidi Robinson has done well with maintaining her weight. She is working on meeting her protein goals and increasing her strengthening exercise. She is working on increasing walking for cardio.   Subjective:   1. Essential hypertension Heidi Robinson's blood pressure was well controlled with her diet and medications, and she is due for labs.   2. Other iron deficiency anemia Heidi Robinson is on FeSoy, and she feels well overall. No tachycardia was noted, and she is due for labs.   3. Pre-diabetes Heidi Robinson is stable on metformin, she denies hypoglycemia and she is due for labs.   4. Pure hypercholesterolemia Heidi Robinson is working is on decreasing cholesterol in her diet. She is not on a statin, and she is due for labs.   5. Vitamin D deficiency Heidi Robinson is no longer on Vitamin Dm and she is due to have her labs rechecked.   6. Emotional Eating Behavior Heidi Robinson is doing well with decreasing emotional eating behaviors. No side effects were noted.   Assessment/Plan:   1. Essential hypertension We will check labs today, and we will refill Zestoretic for 1 month.   - lisinopril-hydrochlorothiazide (ZESTORETIC) 10-12.5 MG tablet; Take 1 tablet by mouth daily. Take 1 tablet by mouth daily.  Dispense: 30 tablet; Refill: 0 - CMP14+EGFR  2. Other iron deficiency anemia We will check labs today, and we will refill FeSoy for 1 month.   - ferrous sulfate 325 (65 FE) MG tablet; Take 1 tablet (325 mg  total) by mouth daily with breakfast.  Dispense: 30 tablet; Refill: 0  3. Pre-diabetes We will check labs today. Heidi Robinson will continue metformin, and we will refill for 1 month.   - metFORMIN (GLUCOPHAGE) 500 MG tablet; Take 1 tablet (500 mg total) by mouth 3 (three) times daily.  Dispense: 90 tablet; Refill: 0 - Hemoglobin A1c - Insulin, random - Vitamin B12  4. Pure hypercholesterolemia We will check labs. Heidi Robinson will continue with her diet and exercise.   - Lipid Panel With LDL/HDL Ratio  5. Vitamin D deficiency We will check labs today, and we will follow-up at Heidi Robinson's next visit.   - VITAMIN D 25 Hydroxy (Vit-D Deficiency, Fractures)  6. Emotional Eating Behavior Heidi Robinson will will continue her medications, and we will refill Topamax and Wellbutrin SR for 1 month.   - topiramate (TOPAMAX) 100 MG tablet; Take 1 tablet (100 mg total) by mouth at bedtime.  Dispense: 30 tablet; Refill: 0 - buPROPion (WELLBUTRIN SR) 200 MG 12 hr tablet; Take 1 tablet (200 mg total) by mouth 2 (two) times daily.  Dispense: 60 tablet; Refill: 0  7. BMI 40.0-44.9, adult  8. Obesity, Beginning BMI 47.19 Heidi Robinson is currently in the action stage of change. As such, her goal is to continue with weight loss efforts. She has agreed to the Category 3 Plan.   Exercise goals: As is.   Behavioral modification strategies: increasing lean protein intake.  Heidi Robinson has agreed to follow-up  with our clinic in 4 weeks. She was informed of the importance of frequent follow-up visits to maximize her success with intensive lifestyle modifications for her multiple health conditions.   Heidi Robinson was informed we would discuss her lab results at her next visit unless there is a critical issue that needs to be addressed sooner. Heidi Robinson agreed to keep her next visit at the agreed upon time to discuss these results.  Objective:   Blood pressure 109/70, pulse 69, temperature (!) 97.5 F (36.4 C), height 5\' 2"  (1.575 m), weight 237 lb (107.5 kg),  SpO2 100 %. Body mass index is 43.35 kg/m.  Lab Results  Component Value Date   CREATININE 0.77 02/11/2023   BUN 8 02/11/2023   NA 137 02/11/2023   K 4.1 02/11/2023   CL 101 02/11/2023   CO2 19 (L) 02/11/2023   Lab Results  Component Value Date   ALT 6 02/11/2023   AST 10 02/11/2023   ALKPHOS 90 02/11/2023   BILITOT <0.2 02/11/2023   Lab Results  Component Value Date   HGBA1C 5.1 02/11/2023   HGBA1C 5.4 12/18/2021   HGBA1C 4.9 06/11/2021   HGBA1C 4.8 12/13/2020   HGBA1C 5.2 07/30/2020   Lab Results  Component Value Date   INSULIN 18.1 02/11/2023   INSULIN 16.6 12/18/2021   INSULIN 18.9 06/11/2021   INSULIN 21.0 07/30/2020   INSULIN 17.3 03/11/2020   Lab Results  Component Value Date   TSH 2.310 12/13/2020   Lab Results  Component Value Date   CHOL 152 02/11/2023   HDL 40 02/11/2023   LDLCALC 95 02/11/2023   TRIG 92 02/11/2023   CHOLHDL 3.7 12/13/2020   Lab Results  Component Value Date   VD25OH 40.8 02/11/2023   VD25OH 47.5 12/18/2021   VD25OH 51.4 06/11/2021   Lab Results  Component Value Date   WBC 11.7 (H) 05/18/2022   HGB 9.1 (L) 05/18/2022   HCT 30.3 (L) 05/18/2022   MCV 63.7 (L) 05/18/2022   PLT 476 (H) 05/18/2022   Lab Results  Component Value Date   IRON 25 (L) 09/01/2019   TIBC 367 09/01/2019   FERRITIN 12 05/18/2022   Attestation Statements:   Reviewed by clinician on day of visit: allergies, medications, problem list, medical history, surgical history, family history, social history, and previous encounter notes.   I, Burt Knack, am acting as transcriptionist for Quillian Quince, MD.  I have reviewed the above documentation for accuracy and completeness, and I agree with the above. -  Quillian Quince, MD

## 2023-03-10 NOTE — Progress Notes (Unsigned)
TeleHealth Visit:  This visit was completed with telemedicine (audio/video) technology. [facility] has verbally consented to this TeleHealth visit. The patient is located at home, the provider is located at home. The participants in this visit include the listed provider and patient. The visit was conducted today via MyChart video.  OBESITY Heidi Robinson is here to discuss her progress with her obesity treatment plan along with follow-up of her obesity related diagnoses.   Today's visit was # 69 Starting weight: 258 lbs Starting date: 12/21/2017 Weight at last in office visit: 237 lbs on 02/11/23 Total weight loss: 19 lbs at last in office visit on 02/11/23. Today's reported weight (03/11/23): none reported   Nutrition Plan: the Category 3 plan - 0% adherence.  Current exercise:  free weights a few times per week.  Interim History:  She says that she has not been able to adhere to plan well because of some personal issues.  She is getting some protein in.  She is eating about 2 meals/day. She has decreased intake of sugar sweetened beverages and increased her water intake.  She knows that she needs to add in some cardio and plans on doing this at some point.  Assessment/Plan:  We discussed recent lab results in depth.  1. Vitamin D Deficiency Vitamin D is not at goal of 50.  Most recent vitamin D level was 40.8 on 02/11/2023.Heidi Robinson She is not on supplementation. Lab Results  Component Value Date   VD25OH 40.8 02/11/2023   VD25OH 47.5 12/18/2021   VD25OH 51.4 06/11/2021    Plan: Start  prescription ergocalciferol 50,000 IU weekly   2. Hypertension Hypertension well controlled.  Electrolytes within normal range.  GFR 96. Medication(s): Lisinopril-HCTZ 10-12.5 mg daily.  BP Readings from Last 3 Encounters:  02/11/23 109/70  12/01/22 121/74  11/21/22 119/79   Lab Results  Component Value Date   CREATININE 0.77 02/11/2023   CREATININE 0.77 12/18/2021   CREATININE 0.93 06/11/2021    No results found for: "GFR"  Plan: Refill lisinopril-HCTZ 10-12.5 mg daily.   3. Prediabetes Last A1c was 5.1 on 02/11/2023. Medication(s): Metformin 500 mg 3 times daily with meals. Lab Results  Component Value Date   HGBA1C 5.1 02/11/2023   HGBA1C 5.4 12/18/2021   HGBA1C 4.9 06/11/2021   HGBA1C 4.8 12/13/2020   HGBA1C 5.2 07/30/2020   Lab Results  Component Value Date   INSULIN 18.1 02/11/2023   INSULIN 16.6 12/18/2021   INSULIN 18.9 06/11/2021   INSULIN 21.0 07/30/2020   INSULIN 17.3 03/11/2020    Plan: Continue and refill metformin 500 mg 3 times daily with meals.  4. Iron deficiency anemia Last hemoglobin low at 11.7.  Iron low at 25. Iron supplementation: Iron 325 mg by mouth daily Lab Results  Component Value Date   WBC 11.7 (H) 05/18/2022   HGB 9.1 (L) 05/18/2022   HCT 30.3 (L) 05/18/2022   MCV 63.7 (L) 05/18/2022   PLT 476 (H) 05/18/2022   Lab Results  Component Value Date   IRON 25 (L) 09/01/2019   TIBC 367 09/01/2019   FERRITIN 12 05/18/2022     Plan: Continue supplementation at current dose Refill iron 325 mg daily.  5. Emotional eating behavior Heidi Robinson has had issues with stress/emotional eating. Currently this is well controlled. Overall mood is stable. Medication(s): Bupropion SR 200 mg twice daily and Topiramate 100 mg daily at bedtime  Plan: Continue and refill Bupropion SR 200 mg twice daily and Topiramate topiramate 100 mg daily at bedtime.  6.  Morbid Obesity: Current BMI 47  Heidi Robinson is currently in the action stage of change. As such, her goal is to continue with weight loss efforts.  She has agreed to the Category 3 plan.  1.  Work on having 3 meals daily and protein at all meals. 2.  Continue to reduce intake of sugar sweetened beverages.  Exercise goals: Continue weight lifting, add in walking.  Behavioral modification strategies: increasing lean protein intake, decreasing simple carbohydrates , no meal skipping, decrease liquid  calories, and planning for success.  Heidi Robinson has agreed to follow-up with our clinic in 4 weeks.   No orders of the defined types were placed in this encounter.   Medications Discontinued During This Encounter  Medication Reason   topiramate (TOPAMAX) 100 MG tablet Reorder   metFORMIN (GLUCOPHAGE) 500 MG tablet Reorder   lisinopril-hydrochlorothiazide (ZESTORETIC) 10-12.5 MG tablet Reorder   buPROPion (WELLBUTRIN SR) 200 MG 12 hr tablet Reorder   ferrous sulfate 325 (65 FE) MG tablet Reorder     Meds ordered this encounter  Medications   Vitamin D, Ergocalciferol, (DRISDOL) 1.25 MG (50000 UNIT) CAPS capsule    Sig: Take 1 capsule (50,000 Units total) by mouth every 7 (seven) days.    Dispense:  5 capsule    Refill:  0    Order Specific Question:   Supervising Provider    Answer:   Carolin Sicks   lisinopril-hydrochlorothiazide (ZESTORETIC) 10-12.5 MG tablet    Sig: Take 1 tablet by mouth daily. Take 1 tablet by mouth daily.    Dispense:  30 tablet    Refill:  0    Order Specific Question:   Supervising Provider    Answer:   Glennis Brink [[id number]]   metFORMIN (GLUCOPHAGE) 500 MG tablet    Sig: Take 1 tablet (500 mg total) by mouth 3 (three) times daily.    Dispense:  90 tablet    Refill:  0    Order Specific Question:   Supervising Provider    Answer:   Glennis Brink [[id number]]   buPROPion (WELLBUTRIN SR) 200 MG 12 hr tablet    Sig: Take 1 tablet (200 mg total) by mouth 2 (two) times daily.    Dispense:  60 tablet    Refill:  0    Order Specific Question:   Supervising Provider    Answer:   Glennis Brink [[id number]]   topiramate (TOPAMAX) 100 MG tablet    Sig: Take 1 tablet (100 mg total) by mouth at bedtime.    Dispense:  30 tablet    Refill:  0    Order Specific Question:   Supervising Provider    Answer:   Glennis Brink [[id number]]   ferrous sulfate 325 (65 FE) MG tablet    Sig: Take 1 tablet (325 mg total) by mouth daily with breakfast.    Dispense:  30 tablet     Refill:  0    Order Specific Question:   Supervising Provider    Answer:   Glennis Brink [[id number]]      Objective:   VITALS: Per patient if applicable, see vitals. GENERAL: Alert and in no acute distress. CARDIOPULMONARY: No increased WOB. Speaking in clear sentences.  PSYCH: Pleasant and cooperative. Speech normal rate and rhythm. Affect is appropriate. Insight and judgement are appropriate. Attention is focused, linear, and appropriate.  NEURO: Oriented as arrived to appointment on time with no prompting.   Attestation Statements:   Reviewed by clinician  on day of visit: allergies, medications, problem list, medical history, surgical history, family history, social history, and previous encounter notes.   This was prepared with the assistance of Engineer, civil (consulting).  Occasional wrong-word or sound-a-like substitutions may have occurred due to the inherent limitations of voice recognition software.

## 2023-03-11 ENCOUNTER — Telehealth (INDEPENDENT_AMBULATORY_CARE_PROVIDER_SITE_OTHER): Payer: No Typology Code available for payment source | Admitting: Family Medicine

## 2023-03-11 ENCOUNTER — Encounter (INDEPENDENT_AMBULATORY_CARE_PROVIDER_SITE_OTHER): Payer: Self-pay | Admitting: Family Medicine

## 2023-03-11 DIAGNOSIS — E559 Vitamin D deficiency, unspecified: Secondary | ICD-10-CM | POA: Diagnosis not present

## 2023-03-11 DIAGNOSIS — R7303 Prediabetes: Secondary | ICD-10-CM

## 2023-03-11 DIAGNOSIS — F3289 Other specified depressive episodes: Secondary | ICD-10-CM

## 2023-03-11 DIAGNOSIS — Z6841 Body Mass Index (BMI) 40.0 and over, adult: Secondary | ICD-10-CM

## 2023-03-11 DIAGNOSIS — I10 Essential (primary) hypertension: Secondary | ICD-10-CM | POA: Diagnosis not present

## 2023-03-11 DIAGNOSIS — D508 Other iron deficiency anemias: Secondary | ICD-10-CM | POA: Diagnosis not present

## 2023-03-11 MED ORDER — BUPROPION HCL ER (SR) 200 MG PO TB12
200.0000 mg | ORAL_TABLET | Freq: Two times a day (BID) | ORAL | 0 refills | Status: DC
Start: 2023-03-11 — End: 2023-04-08

## 2023-03-11 MED ORDER — VITAMIN D (ERGOCALCIFEROL) 1.25 MG (50000 UNIT) PO CAPS
50000.0000 [IU] | ORAL_CAPSULE | ORAL | 0 refills | Status: DC
Start: 2023-03-11 — End: 2023-04-08

## 2023-03-11 MED ORDER — FERROUS SULFATE 325 (65 FE) MG PO TABS
325.0000 mg | ORAL_TABLET | Freq: Every day | ORAL | 0 refills | Status: DC
Start: 2023-03-11 — End: 2023-04-08

## 2023-03-11 MED ORDER — TOPIRAMATE 100 MG PO TABS
100.0000 mg | ORAL_TABLET | Freq: Every day | ORAL | 0 refills | Status: DC
Start: 2023-03-11 — End: 2023-04-08

## 2023-03-11 MED ORDER — LISINOPRIL-HYDROCHLOROTHIAZIDE 10-12.5 MG PO TABS
1.0000 | ORAL_TABLET | Freq: Every day | ORAL | 0 refills | Status: DC
Start: 2023-03-11 — End: 2023-04-08

## 2023-03-11 MED ORDER — METFORMIN HCL 500 MG PO TABS
500.0000 mg | ORAL_TABLET | Freq: Three times a day (TID) | ORAL | 0 refills | Status: DC
Start: 2023-03-11 — End: 2023-04-08

## 2023-04-08 ENCOUNTER — Ambulatory Visit (INDEPENDENT_AMBULATORY_CARE_PROVIDER_SITE_OTHER): Payer: No Typology Code available for payment source | Admitting: Family Medicine

## 2023-04-08 ENCOUNTER — Encounter (INDEPENDENT_AMBULATORY_CARE_PROVIDER_SITE_OTHER): Payer: Self-pay | Admitting: Family Medicine

## 2023-04-08 VITALS — BP 121/75 | HR 80 | Temp 97.9°F | Ht 62.0 in | Wt 247.0 lb

## 2023-04-08 DIAGNOSIS — I10 Essential (primary) hypertension: Secondary | ICD-10-CM

## 2023-04-08 DIAGNOSIS — Z6841 Body Mass Index (BMI) 40.0 and over, adult: Secondary | ICD-10-CM

## 2023-04-08 DIAGNOSIS — D508 Other iron deficiency anemias: Secondary | ICD-10-CM

## 2023-04-08 DIAGNOSIS — E559 Vitamin D deficiency, unspecified: Secondary | ICD-10-CM | POA: Diagnosis not present

## 2023-04-08 DIAGNOSIS — R7303 Prediabetes: Secondary | ICD-10-CM | POA: Diagnosis not present

## 2023-04-08 DIAGNOSIS — R4689 Other symptoms and signs involving appearance and behavior: Secondary | ICD-10-CM

## 2023-04-08 DIAGNOSIS — F3289 Other specified depressive episodes: Secondary | ICD-10-CM

## 2023-04-08 DIAGNOSIS — E669 Obesity, unspecified: Secondary | ICD-10-CM

## 2023-04-08 MED ORDER — TOPIRAMATE 100 MG PO TABS: 100.0000 mg | ORAL_TABLET | Freq: Every day | ORAL | 0 refills | Status: DC

## 2023-04-08 MED ORDER — BUPROPION HCL ER (SR) 200 MG PO TB12
200.0000 mg | ORAL_TABLET | Freq: Two times a day (BID) | ORAL | 0 refills | Status: DC
Start: 2023-04-08 — End: 2023-05-06

## 2023-04-08 MED ORDER — LISINOPRIL-HYDROCHLOROTHIAZIDE 10-12.5 MG PO TABS: 1.0000 | ORAL_TABLET | Freq: Every day | ORAL | 0 refills | Status: DC

## 2023-04-08 MED ORDER — METFORMIN HCL 500 MG PO TABS: 500.0000 mg | ORAL_TABLET | Freq: Three times a day (TID) | ORAL | 0 refills | Status: DC

## 2023-04-08 MED ORDER — VITAMIN D (ERGOCALCIFEROL) 1.25 MG (50000 UNIT) PO CAPS: 50000.0000 [IU] | ORAL_CAPSULE | ORAL | 0 refills | Status: DC

## 2023-04-08 MED ORDER — FERROUS SULFATE 325 (65 FE) MG PO TABS: 325.0000 mg | ORAL_TABLET | Freq: Every day | ORAL | 0 refills | Status: DC

## 2023-04-12 NOTE — Progress Notes (Unsigned)
Chief Complaint:   OBESITY Heidi Robinson is here to discuss her progress with her obesity treatment plan along with follow-up of her obesity related diagnoses. [name] is on the Category 3 Plan and states she is following her eating plan approximately 0% of the time. [name] states she is doing 0 minutes 0 times per week.  Today's visit was #: 70 Starting weight: 258 lbs Starting date: 12/21/2017 Today's weight: 247 lbs Today's date: 04/08/2023 Total lbs lost to date: 11 Total lbs lost since last in-office visit: 0  Interim History: Patient is last visit in office was 7 to 8 weeks ago.  She has gotten off track and she has not been able to follow her eating plan.  She has gained some weight and she has been working long hours at 2 jobs, so meal planning has been difficult.  Subjective:   1. Vitamin D deficiency Patient's vitamin D level has decreased below goal, and she notes fatigue.  I discussed labs with the patient today.  2. Pre-diabetes Patient is on metformin 3 times daily with no side effects noted.  She is struggling with her diet.  3. Essential hypertension Patient's blood pressure is stable on her medications.  She has no signs of hypotension, and no side effects with her medications.  4. Other iron deficiency anemia Patient has both iron deficiency and menorrhea contributing to her anemia.  She is followed by her GYN and may end up getting an ablation.  5. Emotional Eating Behavior Patient has had increased stress and has likely increased simple carbohydrates.  Assessment/Plan:   1. Vitamin D deficiency We will refill prescription vitamin D for 1 month, and we will recheck labs in 3 months.  - Vitamin D, Ergocalciferol, (DRISDOL) 1.25 MG (50000 UNIT) CAPS capsule; Take 1 capsule (50,000 Units total) by mouth every 7 (seven) days.  Dispense: 5 capsule; Refill: 0  2. Pre-diabetes We will refill metformin for 1 month.  Patient will continue to work on not skipping  breakfast.  - metFORMIN (GLUCOPHAGE) 500 MG tablet; Take 1 tablet (500 mg total) by mouth 3 (three) times daily.  Dispense: 90 tablet; Refill: 0  3. Essential hypertension Patient will continue lisinopril-hydrochlorothiazide, and we will refill for 1 month.  - lisinopril-hydrochlorothiazide (ZESTORETIC) 10-12.5 MG tablet; Take 1 tablet by mouth daily. Take 1 tablet by mouth daily.  Dispense: 30 tablet; Refill: 0  4. Other iron deficiency anemia Patient will continue ferrous sulfate, and we will refill for 1 month.  We will continue to follow.  - ferrous sulfate 325 (65 FE) MG tablet; Take 1 tablet (325 mg total) by mouth daily with breakfast.  Dispense: 30 tablet; Refill: 0  5. Emotional Eating Behavior We will refill Wellbutrin SR and Topamax for 1 month.  Patient will work on identifying and decreasing emotional eating behavior.  - buPROPion (WELLBUTRIN SR) 200 MG 12 hr tablet; Take 1 tablet (200 mg total) by mouth 2 (two) times daily.  Dispense: 60 tablet; Refill: 0 - topiramate (TOPAMAX) 100 MG tablet; Take 1 tablet (100 mg total) by mouth at bedtime.  Dispense: 30 tablet; Refill: 0  6. BMI 45.0-49.9, adult (HCC)  7. Obesity, with starting BMI 47.19 Panhia is currently in the action stage of change. As such, her goal is to continue with weight loss efforts. She has agreed to the Category 3 Plan.   Behavioral modification strategies: increasing lean protein intake, no skipping meals, and meal planning and cooking strategies.  Heidi Robinson has agreed  to follow-up with our clinic in 4 weeks. She was informed of the importance of frequent follow-up visits to maximize her success with intensive lifestyle modifications for her multiple health conditions.   Objective:   Blood pressure 121/75, pulse 80, temperature 97.9 F (36.6 C), height 5\' 2"  (1.575 m), weight 247 lb (112 kg), SpO2 100 %. Body mass index is 45.18 kg/m.  Lab Results  Component Value Date   CREATININE 0.77 02/11/2023   BUN  8 02/11/2023   NA 137 02/11/2023   K 4.1 02/11/2023   CL 101 02/11/2023   CO2 19 (L) 02/11/2023   Lab Results  Component Value Date   ALT 6 02/11/2023   AST 10 02/11/2023   ALKPHOS 90 02/11/2023   BILITOT <0.2 02/11/2023   Lab Results  Component Value Date   HGBA1C 5.1 02/11/2023   HGBA1C 5.4 12/18/2021   HGBA1C 4.9 06/11/2021   HGBA1C 4.8 12/13/2020   HGBA1C 5.2 07/30/2020   Lab Results  Component Value Date   INSULIN 18.1 02/11/2023   INSULIN 16.6 12/18/2021   INSULIN 18.9 06/11/2021   INSULIN 21.0 07/30/2020   INSULIN 17.3 03/11/2020   Lab Results  Component Value Date   TSH 2.310 12/13/2020   Lab Results  Component Value Date   CHOL 152 02/11/2023   HDL 40 02/11/2023   LDLCALC 95 02/11/2023   TRIG 92 02/11/2023   CHOLHDL 3.7 12/13/2020   Lab Results  Component Value Date   VD25OH 40.8 02/11/2023   VD25OH 47.5 12/18/2021   VD25OH 51.4 06/11/2021   Lab Results  Component Value Date   WBC 11.7 (H) 05/18/2022   HGB 9.1 (L) 05/18/2022   HCT 30.3 (L) 05/18/2022   MCV 63.7 (L) 05/18/2022   PLT 476 (H) 05/18/2022   Lab Results  Component Value Date   IRON 25 (L) 09/01/2019   TIBC 367 09/01/2019   FERRITIN 12 05/18/2022   Attestation Statements:   Reviewed by clinician on day of visit: allergies, medications, problem list, medical history, surgical history, family history, social history, and previous encounter notes.   I, Burt Knack, am acting as transcriptionist for Quillian Quince, MD.  I have reviewed the above documentation for accuracy and completeness, and I agree with the above. -  Quillian Quince, MD

## 2023-05-06 ENCOUNTER — Ambulatory Visit (INDEPENDENT_AMBULATORY_CARE_PROVIDER_SITE_OTHER): Payer: No Typology Code available for payment source | Admitting: Family Medicine

## 2023-05-06 ENCOUNTER — Encounter (INDEPENDENT_AMBULATORY_CARE_PROVIDER_SITE_OTHER): Payer: Self-pay | Admitting: Family Medicine

## 2023-05-06 VITALS — BP 116/79 | HR 95 | Temp 98.9°F | Ht 62.0 in | Wt 242.0 lb

## 2023-05-06 DIAGNOSIS — E559 Vitamin D deficiency, unspecified: Secondary | ICD-10-CM

## 2023-05-06 DIAGNOSIS — E669 Obesity, unspecified: Secondary | ICD-10-CM

## 2023-05-06 DIAGNOSIS — I10 Essential (primary) hypertension: Secondary | ICD-10-CM | POA: Diagnosis not present

## 2023-05-06 DIAGNOSIS — D508 Other iron deficiency anemias: Secondary | ICD-10-CM

## 2023-05-06 DIAGNOSIS — R4689 Other symptoms and signs involving appearance and behavior: Secondary | ICD-10-CM

## 2023-05-06 DIAGNOSIS — R7303 Prediabetes: Secondary | ICD-10-CM

## 2023-05-06 DIAGNOSIS — F3289 Other specified depressive episodes: Secondary | ICD-10-CM

## 2023-05-06 DIAGNOSIS — Z6841 Body Mass Index (BMI) 40.0 and over, adult: Secondary | ICD-10-CM

## 2023-05-06 MED ORDER — METFORMIN HCL 500 MG PO TABS: 500.0000 mg | ORAL_TABLET | Freq: Three times a day (TID) | ORAL | 0 refills | Status: DC

## 2023-05-06 MED ORDER — BUPROPION HCL ER (SR) 200 MG PO TB12: 200.0000 mg | ORAL_TABLET | Freq: Two times a day (BID) | ORAL | 0 refills | Status: DC

## 2023-05-06 MED ORDER — VITAMIN D (ERGOCALCIFEROL) 1.25 MG (50000 UNIT) PO CAPS
50000.0000 [IU] | ORAL_CAPSULE | ORAL | 0 refills | Status: DC
Start: 2023-05-06 — End: 2023-06-03

## 2023-05-06 MED ORDER — FERROUS SULFATE 325 (65 FE) MG PO TABS: 325.0000 mg | ORAL_TABLET | Freq: Every day | ORAL | 0 refills | Status: DC

## 2023-05-06 MED ORDER — TOPIRAMATE 100 MG PO TABS: 100.0000 mg | ORAL_TABLET | Freq: Every day | ORAL | 0 refills | Status: DC

## 2023-05-06 MED ORDER — LISINOPRIL-HYDROCHLOROTHIAZIDE 10-12.5 MG PO TABS: 1.0000 | ORAL_TABLET | Freq: Every day | ORAL | 0 refills | Status: DC

## 2023-05-10 NOTE — Progress Notes (Signed)
Chief Complaint:   OBESITY Heidi Robinson is here to discuss her progress with her obesity treatment plan along with follow-up of her obesity related diagnoses. Heidi Robinson is on the Category 3 Plan and states she is following her eating plan approximately 20% of the time. Heidi Robinson states she is doing 0 minutes 0 times per week.  Today's visit was #: 71 Starting weight: 258 lbs Starting date: 12/21/2017 Today's weight: 242 lbs Today's date: 05/06/2023 Total lbs lost to date: 16 Total lbs lost since last in-office visit: 5  Interim History: Patient has done better with her weight loss.  She has been able to plan better and not skip meals.  She is working on decreasing regular soda.  She is working on increasing her exercise while avoiding the excessive heat.  Subjective:   1. Other iron deficiency anemia Patient is on iron supplementation with no side effects noted.  2. Essential hypertension Patient's blood pressure is well-controlled, and she is doing well with her diet.  3. Pre-diabetes Patient is on metformin with no side effects noted.  4. Vitamin D deficiency Patient is stable on prescription vitamin D.  5. Emotional Eating Behavior Patient is on Topamax and Wellbutrin SR with no side effects noted.  Assessment/Plan:   1. Other iron deficiency anemia Patient will continue ferrous sulfate, and we will refill for 1 month.  - ferrous sulfate 325 (65 FE) MG tablet; Take 1 tablet (325 mg total) by mouth daily with breakfast.  Dispense: 30 tablet; Refill: 0  2. Essential hypertension Patient will continue lisinopril-hydrochlorothiazide, and we will refill for 1 month.  - lisinopril-hydrochlorothiazide (ZESTORETIC) 10-12.5 MG tablet; Take 1 tablet by mouth daily. Take 1 tablet by mouth daily.  Dispense: 30 tablet; Refill: 0  3. Pre-diabetes Patient will continue metformin 3 times daily, and we will refill for 1 month.  - metFORMIN (GLUCOPHAGE) 500 MG tablet; Take 1 tablet (500 mg total)  by mouth 3 (three) times daily.  Dispense: 90 tablet; Refill: 0  4. Vitamin D deficiency Patient will continue prescription vitamin D, and we will refill for 1 month.  - Vitamin D, Ergocalciferol, (DRISDOL) 1.25 MG (50000 UNIT) CAPS capsule; Take 1 capsule (50,000 Units total) by mouth every 7 (seven) days.  Dispense: 5 capsule; Refill: 0  5. Emotional Eating Behavior Patient will continue Wellbutrin SR twice daily and Topamax, and we will refill for 1 month.  - buPROPion (WELLBUTRIN SR) 200 MG 12 hr tablet; Take 1 tablet (200 mg total) by mouth 2 (two) times daily.  Dispense: 60 tablet; Refill: 0 - topiramate (TOPAMAX) 100 MG tablet; Take 1 tablet (100 mg total) by mouth at bedtime.  Dispense: 30 tablet; Refill: 0  6. BMI 40.0-44.9, adult (HCC)  7. Obesity, Beginning BMI 47.19 Heidi Robinson is currently in the action stage of change. As such, her goal is to continue with weight loss efforts. She has agreed to the Category 2 Plan and the Category 3 Plan.   Behavioral modification strategies: increasing lean protein intake.  Heidi Robinson has agreed to follow-up with our clinic in 4 weeks. She was informed of the importance of frequent follow-up visits to maximize her success with intensive lifestyle modifications for her multiple health conditions.   Objective:   Blood pressure 116/79, pulse 95, temperature 98.9 F (37.2 C), height 5\' 2"  (1.575 m), weight 242 lb (109.8 kg), SpO2 99 %. Body mass index is 44.26 kg/m.  Lab Results  Component Value Date   CREATININE 0.77 02/11/2023  BUN 8 02/11/2023   NA 137 02/11/2023   K 4.1 02/11/2023   CL 101 02/11/2023   CO2 19 (L) 02/11/2023   Lab Results  Component Value Date   ALT 6 02/11/2023   AST 10 02/11/2023   ALKPHOS 90 02/11/2023   BILITOT <0.2 02/11/2023   Lab Results  Component Value Date   HGBA1C 5.1 02/11/2023   HGBA1C 5.4 12/18/2021   HGBA1C 4.9 06/11/2021   HGBA1C 4.8 12/13/2020   HGBA1C 5.2 07/30/2020   Lab Results  Component  Value Date   INSULIN 18.1 02/11/2023   INSULIN 16.6 12/18/2021   INSULIN 18.9 06/11/2021   INSULIN 21.0 07/30/2020   INSULIN 17.3 03/11/2020   Lab Results  Component Value Date   TSH 2.310 12/13/2020   Lab Results  Component Value Date   CHOL 152 02/11/2023   HDL 40 02/11/2023   LDLCALC 95 02/11/2023   TRIG 92 02/11/2023   CHOLHDL 3.7 12/13/2020   Lab Results  Component Value Date   VD25OH 40.8 02/11/2023   VD25OH 47.5 12/18/2021   VD25OH 51.4 06/11/2021   Lab Results  Component Value Date   WBC 11.7 (H) 05/18/2022   HGB 9.1 (L) 05/18/2022   HCT 30.3 (L) 05/18/2022   MCV 63.7 (L) 05/18/2022   PLT 476 (H) 05/18/2022   Lab Results  Component Value Date   IRON 25 (L) 09/01/2019   TIBC 367 09/01/2019   FERRITIN 12 05/18/2022   Attestation Statements:   Reviewed by clinician on day of visit: allergies, medications, problem list, medical history, surgical history, family history, social history, and previous encounter notes.   I, Burt Knack, am acting as transcriptionist for Quillian Quince, MD.  I have reviewed the above documentation for accuracy and completeness, and I agree with the above. -  Quillian Quince, MD

## 2023-06-03 ENCOUNTER — Encounter (INDEPENDENT_AMBULATORY_CARE_PROVIDER_SITE_OTHER): Payer: Self-pay | Admitting: Family Medicine

## 2023-06-03 ENCOUNTER — Ambulatory Visit (INDEPENDENT_AMBULATORY_CARE_PROVIDER_SITE_OTHER): Payer: No Typology Code available for payment source | Admitting: Family Medicine

## 2023-06-03 VITALS — BP 118/80 | HR 92 | Temp 97.9°F | Ht 62.0 in | Wt 242.0 lb

## 2023-06-03 DIAGNOSIS — D508 Other iron deficiency anemias: Secondary | ICD-10-CM

## 2023-06-03 DIAGNOSIS — E559 Vitamin D deficiency, unspecified: Secondary | ICD-10-CM

## 2023-06-03 DIAGNOSIS — R7303 Prediabetes: Secondary | ICD-10-CM

## 2023-06-03 DIAGNOSIS — F3289 Other specified depressive episodes: Secondary | ICD-10-CM

## 2023-06-03 DIAGNOSIS — E669 Obesity, unspecified: Secondary | ICD-10-CM

## 2023-06-03 DIAGNOSIS — Z6841 Body Mass Index (BMI) 40.0 and over, adult: Secondary | ICD-10-CM

## 2023-06-03 MED ORDER — TOPIRAMATE 100 MG PO TABS
100.0000 mg | ORAL_TABLET | Freq: Every day | ORAL | 0 refills | Status: DC
Start: 2023-06-03 — End: 2023-07-15

## 2023-06-03 MED ORDER — VITAMIN D (ERGOCALCIFEROL) 1.25 MG (50000 UNIT) PO CAPS
50000.0000 [IU] | ORAL_CAPSULE | ORAL | 0 refills | Status: DC
Start: 2023-06-03 — End: 2023-07-15

## 2023-06-03 MED ORDER — BUPROPION HCL ER (SR) 200 MG PO TB12
200.0000 mg | ORAL_TABLET | Freq: Two times a day (BID) | ORAL | 0 refills | Status: DC
Start: 2023-06-03 — End: 2023-07-15

## 2023-06-03 MED ORDER — METFORMIN HCL 500 MG PO TABS
500.0000 mg | ORAL_TABLET | Freq: Three times a day (TID) | ORAL | 0 refills | Status: DC
Start: 2023-06-03 — End: 2023-07-15

## 2023-06-03 MED ORDER — FERROUS SULFATE 325 (65 FE) MG PO TABS
325.0000 mg | ORAL_TABLET | Freq: Every day | ORAL | 0 refills | Status: AC
Start: 2023-06-03 — End: ?

## 2023-06-07 NOTE — Progress Notes (Signed)
Chief Complaint:   OBESITY Heidi Robinson is here to discuss her progress with her obesity treatment plan along with follow-up of her obesity related diagnoses. Heidi Robinson is on the Category 2 Plan and the Category 3 Plan and states she is following her eating plan approximately 10% of the time. Heidi Robinson states she is doing 0 minutes 0 times per week.  Today's visit was #: 72 Starting weight: 258 lbs Starting date: 12/21/2017 Today's weight: 242 lbs Today's date: 06/03/2023 Total lbs lost to date: 16 Total lbs lost since last in-office visit: 0  Interim History: Patient has been on vacation, and she has done well with maintaining her weight loss.  She tried to increase her water and protein intake and she was able to avoid vacation weight gain.  Subjective:   1. Pre-diabetes Patient continues to do well with her diet, and she has no problems with metformin.  2. Vitamin D deficiency Patient is on vitamin D prescription, and she has no signs of over replacement.  3. Other iron deficiency anemia Patient is tolerating iron supplementation well, and she requests a refill today.  She is working on increasing iron in her diet as well.  4. Emotional Eating Behavior Patient's mood is stable on her medications, with no side effects noted.  She is doing well with decreasing emotional eating behavior.  Assessment/Plan:   1. Pre-diabetes Patient will continue metformin 500 mg 3 times daily, and we will refill for 1 month.  - metFORMIN (GLUCOPHAGE) 500 MG tablet; Take 1 tablet (500 mg total) by mouth 3 (three) times daily.  Dispense: 90 tablet; Refill: 0  2. Vitamin D deficiency Patient will continue once weekly prescription vitamin D, and we will refill for 1 month.  - Vitamin D, Ergocalciferol, (DRISDOL) 1.25 MG (50000 UNIT) CAPS capsule; Take 1 capsule (50,000 Units total) by mouth every 7 (seven) days.  Dispense: 5 capsule; Refill: 0  3. Other iron deficiency anemia Patient will continue ferrous  sulfate 325 mg once daily, and we will refill for 1 month.  - ferrous sulfate 325 (65 FE) MG tablet; Take 1 tablet (325 mg total) by mouth daily with breakfast.  Dispense: 30 tablet; Refill: 0  4. Emotional Eating Behavior Patient will continue her medications, and we will refill Topamax and Wellbutrin SR for 1 month.  - topiramate (TOPAMAX) 100 MG tablet; Take 1 tablet (100 mg total) by mouth at bedtime.  Dispense: 30 tablet; Refill: 0 - buPROPion (WELLBUTRIN SR) 200 MG 12 hr tablet; Take 1 tablet (200 mg total) by mouth 2 (two) times daily.  Dispense: 60 tablet; Refill: 0  5. BMI 40.0-44.9, adult (HCC)  6. Obesity, Beginning BMI 47.19 Heidi Robinson is currently in the action stage of change. As such, her goal is to continue with weight loss efforts. She has agreed to the Category 2 Plan.   Behavioral modification strategies: increasing lean protein intake.  Heidi Robinson has agreed to follow-up with our clinic in 4 weeks. She was informed of the importance of frequent follow-up visits to maximize her success with intensive lifestyle modifications for her multiple health conditions.   Objective:   Blood pressure 118/80, pulse 92, temperature 97.9 F (36.6 C), height 5\' 2"  (1.575 m), weight 242 lb (109.8 kg), SpO2 96%. Body mass index is 44.26 kg/m.  Lab Results  Component Value Date   CREATININE 0.77 02/11/2023   BUN 8 02/11/2023   NA 137 02/11/2023   K 4.1 02/11/2023   CL 101 02/11/2023  CO2 19 (L) 02/11/2023   Lab Results  Component Value Date   ALT 6 02/11/2023   AST 10 02/11/2023   ALKPHOS 90 02/11/2023   BILITOT <0.2 02/11/2023   Lab Results  Component Value Date   HGBA1C 5.1 02/11/2023   HGBA1C 5.4 12/18/2021   HGBA1C 4.9 06/11/2021   HGBA1C 4.8 12/13/2020   HGBA1C 5.2 07/30/2020   Lab Results  Component Value Date   INSULIN 18.1 02/11/2023   INSULIN 16.6 12/18/2021   INSULIN 18.9 06/11/2021   INSULIN 21.0 07/30/2020   INSULIN 17.3 03/11/2020   Lab Results  Component  Value Date   TSH 2.310 12/13/2020   Lab Results  Component Value Date   CHOL 152 02/11/2023   HDL 40 02/11/2023   LDLCALC 95 02/11/2023   TRIG 92 02/11/2023   CHOLHDL 3.7 12/13/2020   Lab Results  Component Value Date   VD25OH 40.8 02/11/2023   VD25OH 47.5 12/18/2021   VD25OH 51.4 06/11/2021   Lab Results  Component Value Date   WBC 11.7 (H) 05/18/2022   HGB 9.1 (L) 05/18/2022   HCT 30.3 (L) 05/18/2022   MCV 63.7 (L) 05/18/2022   PLT 476 (H) 05/18/2022   Lab Results  Component Value Date   IRON 25 (L) 09/01/2019   TIBC 367 09/01/2019   FERRITIN 12 05/18/2022   Attestation Statements:   Reviewed by clinician on day of visit: allergies, medications, problem list, medical history, surgical history, family history, social history, and previous encounter notes.   I, Burt Knack, am acting as transcriptionist for Quillian Quince, MD.  I have reviewed the above documentation for accuracy and completeness, and I agree with the above. -  Quillian Quince, MD

## 2023-07-04 ENCOUNTER — Other Ambulatory Visit (INDEPENDENT_AMBULATORY_CARE_PROVIDER_SITE_OTHER): Payer: Self-pay | Admitting: Family Medicine

## 2023-07-04 DIAGNOSIS — R7303 Prediabetes: Secondary | ICD-10-CM

## 2023-07-04 DIAGNOSIS — E559 Vitamin D deficiency, unspecified: Secondary | ICD-10-CM

## 2023-07-15 ENCOUNTER — Telehealth (INDEPENDENT_AMBULATORY_CARE_PROVIDER_SITE_OTHER): Payer: No Typology Code available for payment source | Admitting: Family Medicine

## 2023-07-15 ENCOUNTER — Encounter (INDEPENDENT_AMBULATORY_CARE_PROVIDER_SITE_OTHER): Payer: Self-pay | Admitting: Family Medicine

## 2023-07-15 DIAGNOSIS — I10 Essential (primary) hypertension: Secondary | ICD-10-CM | POA: Diagnosis not present

## 2023-07-15 DIAGNOSIS — Z87891 Personal history of nicotine dependence: Secondary | ICD-10-CM

## 2023-07-15 DIAGNOSIS — F3289 Other specified depressive episodes: Secondary | ICD-10-CM

## 2023-07-15 DIAGNOSIS — E559 Vitamin D deficiency, unspecified: Secondary | ICD-10-CM

## 2023-07-15 DIAGNOSIS — R7303 Prediabetes: Secondary | ICD-10-CM

## 2023-07-15 DIAGNOSIS — Z6841 Body Mass Index (BMI) 40.0 and over, adult: Secondary | ICD-10-CM

## 2023-07-15 MED ORDER — TOPIRAMATE 100 MG PO TABS
100.0000 mg | ORAL_TABLET | Freq: Every day | ORAL | 0 refills | Status: AC
Start: 2023-07-15 — End: ?

## 2023-07-15 MED ORDER — LISINOPRIL-HYDROCHLOROTHIAZIDE 10-12.5 MG PO TABS
1.0000 | ORAL_TABLET | Freq: Every day | ORAL | 0 refills | Status: AC
Start: 2023-07-15 — End: ?

## 2023-07-15 MED ORDER — METFORMIN HCL 500 MG PO TABS
500.0000 mg | ORAL_TABLET | Freq: Three times a day (TID) | ORAL | 0 refills | Status: AC
Start: 2023-07-15 — End: ?

## 2023-07-15 MED ORDER — BUPROPION HCL ER (SR) 200 MG PO TB12
200.0000 mg | ORAL_TABLET | Freq: Two times a day (BID) | ORAL | 0 refills | Status: AC
Start: 2023-07-15 — End: ?

## 2023-07-15 MED ORDER — VITAMIN D (ERGOCALCIFEROL) 1.25 MG (50000 UNIT) PO CAPS
50000.0000 [IU] | ORAL_CAPSULE | ORAL | 0 refills | Status: AC
Start: 2023-07-15 — End: ?

## 2023-07-15 NOTE — Progress Notes (Signed)
TeleHealth Visit:  This visit was completed with telemedicine (audio/video) technology. Heidi Robinson has verbally consented to this TeleHealth visit. The patient is located at home, the provider is located at home. The participants in this visit include the listed provider and patient. The visit was conducted today via MyChart video.  OBESITY Heidi Robinson is here to discuss her progress with her obesity treatment plan along with follow-up of her obesity related diagnoses.   Today's visit was # 73 Starting weight: 258 lbs Starting date: 12/21/2017 Weight at last in office visit: 242 lbs on 06/03/23 Total weight loss: 16 lbs at last in office visit on 06/03/23. Today's reported weight (07/15/23): none reported  Nutrition Plan: the Category 2 plan - 25% adherence.  Current exercise:  walking pad 1 mile 3-4 days per week and pedaler.  Interim History:  She reports doing a fast for church. She has cut out sodas and sweets for 21 days. She plans on continuing this after the fast. She is working on eating out less. Son recently started college at [employer] in [place]. This makes planning dinner easier and also helps her keep unhealthy snacks out of the house.  Assessment/Plan:  1. Other depression/emotional eating Heidi Robinson has had issues with stress/emotional eating. Currently this is moderately controlled. Overall mood is stable. Medication(s): Bupropion SR 200 mg twice daily and Topiramate 100 mg daily (in evening).  Plan: Continue and refill Bupropion SR 200 mg twice daily and Topiramate 100 mg daily (in evening).  2. Prediabetes Last A1c was 5.1.  Medication(s): Metformin 500 mg three daily with meals. Tolerating welle.  Lab Results  Component Value Date   HGBA1C 5.1 02/11/2023   HGBA1C 5.4 12/18/2021   HGBA1C 4.9 06/11/2021   HGBA1C 4.8 12/13/2020   HGBA1C 5.2 07/30/2020   Lab Results  Component Value Date   INSULIN 18.1 02/11/2023   INSULIN 16.6 12/18/2021   INSULIN 18.9  06/11/2021   INSULIN 21.0 07/30/2020   INSULIN 17.3 03/11/2020    Plan: Continue and refill Metformin 500 mg three daily with meals.   3. Hypertension Hypertension well controlled.  Medication(s): Lisinopril-HCTZ 10-12.5 once daily  BP Readings from Last 3 Encounters:  06/03/23 118/80  05/06/23 116/79  04/08/23 121/75   Lab Results  Component Value Date   CREATININE 0.77 02/11/2023   CREATININE 0.77 12/18/2021   CREATININE 0.93 06/11/2021   No results found for: "GFR"  Plan: Refill Lisinopril-HCTZ 10-12.5 once daily  4. Vitamin D Deficiency Vitamin D is not at goal of 50.  Most recent vitamin D level was 40.8.[name] She is on  prescription ergocalciferol 50,000 IU weekly. Lab Results  Component Value Date   VD25OH 40.8 02/11/2023   VD25OH 47.5 12/18/2021   VD25OH 51.4 06/11/2021    Plan: Continue and refill  prescription ergocalciferol 50,000 IU weekly   5. Morbid Obesity: Current BMI 44  Heidi Robinson is currently in the action stage of change. As such, her goal is to continue with weight loss efforts.  She has agreed to the Category 2 plan.  Discussed trying [employer] or [employer] for their prepared meals.  Exercise goals: Continue to increase time on the walking pad.  Behavioral modification strategies: decrease eating out, meal planning , and planning for success.  Heidi Robinson has agreed to follow-up with our clinic in 4 weeks.  No orders of the defined types were placed in this encounter.   Medications Discontinued During This Encounter  Medication Reason   lisinopril-hydrochlorothiazide (ZESTORETIC) 10-12.5 MG tablet Reorder  Vitamin D, Ergocalciferol, (DRISDOL) 1.25 MG (50000 UNIT) CAPS capsule Reorder   topiramate (TOPAMAX) 100 MG tablet Reorder   metFORMIN (GLUCOPHAGE) 500 MG tablet Reorder   buPROPion (WELLBUTRIN SR) 200 MG 12 hr tablet Reorder     Meds ordered this encounter  Medications   topiramate (TOPAMAX) 100 MG tablet    Sig: Take 1 tablet (100  mg total) by mouth at bedtime.    Dispense:  30 tablet    Refill:  0    Order Specific Question:   Supervising Provider    Answer:   Glennis Brink [[id number]]   metFORMIN (GLUCOPHAGE) 500 MG tablet    Sig: Take 1 tablet (500 mg total) by mouth 3 (three) times daily.    Dispense:  90 tablet    Refill:  0    Order Specific Question:   Supervising Provider    Answer:   Glennis Brink [[id number]]   buPROPion (WELLBUTRIN SR) 200 MG 12 hr tablet    Sig: Take 1 tablet (200 mg total) by mouth 2 (two) times daily.    Dispense:  60 tablet    Refill:  0    Order Specific Question:   Supervising Provider    Answer:   Seymour Bars E [[id number]]   Vitamin D, Ergocalciferol, (DRISDOL) 1.25 MG (50000 UNIT) CAPS capsule    Sig: Take 1 capsule (50,000 Units total) by mouth every 7 (seven) days.    Dispense:  5 capsule    Refill:  0    Order Specific Question:   Supervising Provider    Answer:   Carolin Sicks   lisinopril-hydrochlorothiazide (ZESTORETIC) 10-12.5 MG tablet    Sig: Take 1 tablet by mouth daily. Take 1 tablet by mouth daily.    Dispense:  30 tablet    Refill:  0    Order Specific Question:   Supervising Provider    Answer:   Glennis Brink [[id number]]      Objective:   VITALS: Per patient if applicable, see vitals. GENERAL: Alert and in no acute distress. CARDIOPULMONARY: No increased WOB. Speaking in clear sentences.  PSYCH: Pleasant and cooperative. Speech normal rate and rhythm. Affect is appropriate. Insight and judgement are appropriate. Attention is focused, linear, and appropriate.  NEURO: Oriented as arrived to appointment on time with no prompting.   Attestation Statements:   Reviewed by clinician on day of visit: allergies, medications, problem list, medical history, surgical history, family history, social history, and previous encounter notes.   This was prepared with the assistance of Engineer, civil (consulting).  Occasional wrong-word or sound-a-like substitutions may have  occurred due to the inherent limitations of voice recognition software.

## 2023-08-12 ENCOUNTER — Encounter (INDEPENDENT_AMBULATORY_CARE_PROVIDER_SITE_OTHER): Payer: Self-pay | Admitting: Family Medicine

## 2023-08-12 ENCOUNTER — Ambulatory Visit (INDEPENDENT_AMBULATORY_CARE_PROVIDER_SITE_OTHER): Payer: No Typology Code available for payment source | Admitting: Family Medicine

## 2023-08-12 VITALS — BP 120/84 | HR 103 | Temp 97.9°F | Ht 62.0 in | Wt 238.0 lb

## 2023-08-12 DIAGNOSIS — R7303 Prediabetes: Secondary | ICD-10-CM

## 2023-08-12 DIAGNOSIS — D508 Other iron deficiency anemias: Secondary | ICD-10-CM | POA: Diagnosis not present

## 2023-08-12 DIAGNOSIS — F5089 Other specified eating disorder: Secondary | ICD-10-CM

## 2023-08-12 DIAGNOSIS — F3289 Other specified depressive episodes: Secondary | ICD-10-CM

## 2023-08-12 DIAGNOSIS — I10 Essential (primary) hypertension: Secondary | ICD-10-CM | POA: Diagnosis not present

## 2023-08-12 DIAGNOSIS — Z6841 Body Mass Index (BMI) 40.0 and over, adult: Secondary | ICD-10-CM

## 2023-08-12 DIAGNOSIS — E559 Vitamin D deficiency, unspecified: Secondary | ICD-10-CM | POA: Diagnosis not present

## 2023-08-12 DIAGNOSIS — E669 Obesity, unspecified: Secondary | ICD-10-CM

## 2023-08-12 MED ORDER — LISINOPRIL-HYDROCHLOROTHIAZIDE 10-12.5 MG PO TABS
1.0000 | ORAL_TABLET | Freq: Every day | ORAL | 0 refills | Status: DC
Start: 2023-08-12 — End: 2023-09-28

## 2023-08-12 MED ORDER — METFORMIN HCL 500 MG PO TABS
500.0000 mg | ORAL_TABLET | Freq: Three times a day (TID) | ORAL | 0 refills | Status: DC
Start: 2023-08-12 — End: 2023-09-28

## 2023-08-12 MED ORDER — FERROUS SULFATE 325 (65 FE) MG PO TABS
325.0000 mg | ORAL_TABLET | Freq: Every day | ORAL | 0 refills | Status: DC
Start: 2023-08-12 — End: 2023-09-28

## 2023-08-12 MED ORDER — TOPIRAMATE 100 MG PO TABS
100.0000 mg | ORAL_TABLET | Freq: Every day | ORAL | 0 refills | Status: DC
Start: 2023-08-12 — End: 2023-09-28

## 2023-08-12 MED ORDER — VITAMIN D (ERGOCALCIFEROL) 1.25 MG (50000 UNIT) PO CAPS
50000.0000 [IU] | ORAL_CAPSULE | ORAL | 0 refills | Status: DC
Start: 2023-08-12 — End: 2023-09-28

## 2023-08-12 MED ORDER — BUPROPION HCL ER (SR) 200 MG PO TB12
200.0000 mg | ORAL_TABLET | Freq: Two times a day (BID) | ORAL | 0 refills | Status: DC
Start: 2023-08-12 — End: 2023-09-28

## 2023-08-16 ENCOUNTER — Ambulatory Visit (INDEPENDENT_AMBULATORY_CARE_PROVIDER_SITE_OTHER): Payer: No Typology Code available for payment source | Admitting: Family Medicine

## 2023-08-16 NOTE — Progress Notes (Signed)
Chief Complaint:   OBESITY Heidi Robinson is here to discuss her progress with her obesity treatment plan along with follow-up of her obesity related diagnoses. Heidi Robinson is on the Category 2 Plan and states she is following her eating plan approximately 10% of the time. Heidi Robinson states she is walking, on the treadmill, and weight lifting for 15 minutes 3 times per week.  Today's visit was #: 74 Starting weight: 258 lbs Starting date: 12/21/2017 Today's weight: 238 lbs Today's date: 08/12/2023 Total lbs lost to date: 20 Total lbs lost since last in-office visit: 4  Interim History: Patient has been trying to increase her exercise recently, but she has struggled to concentrate on weight loss with all of the other stressors in her life.    Subjective:   1. Vitamin D deficiency Patient is stable on vitamin D, and she is due for labs.  2. Pre-diabetes Patient is on metformin, and she is working on increasing her exercise.  She is due for labs.  3. Essential hypertension Patient's blood pressure is controlled on her medications.  No chest pain or headache were noted.  4. Other iron deficiency anemia Patient is on FeSoy, and she is due to have her labs rechecked soon.  5. Emotional Eating Behavior Patient has increased stress with her mother's health.  She is doing well on her medications, and her mood is stable.  Assessment/Plan:   1. Vitamin D deficiency We will check labs today, and we will follow-up at patient's next visit.  Patient will continue prescription vitamin D once weekly, and we will refill for 1 month.  - Vitamin D, Ergocalciferol, (DRISDOL) 1.25 MG (50000 UNIT) CAPS capsule; Take 1 capsule (50,000 Units total) by mouth every 7 (seven) days.  Dispense: 5 capsule; Refill: 0 - VITAMIN D 25 Hydroxy (Vit-D Deficiency, Fractures)  2. Pre-diabetes We will check labs today, and we will follow-up at patient's next visit.  Patient will continue metformin 500 mg 3 times daily, and we will  refill for 1 month.  - metFORMIN (GLUCOPHAGE) 500 MG tablet; Take 1 tablet (500 mg total) by mouth 3 (three) times daily.  Dispense: 90 tablet; Refill: 0 - Lipid Panel With LDL/HDL Ratio - Insulin, random - Hemoglobin A1c - Vitamin B12  3. Essential hypertension We will check labs today, we will follow-up at patient's next visit.  Patient will continue Zestoretic 10-12.5 mg once daily, and we will refill for 1 month.  - lisinopril-hydrochlorothiazide (ZESTORETIC) 10-12.5 MG tablet; Take 1 tablet by mouth daily. Take 1 tablet by mouth daily.  Dispense: 30 tablet; Refill: 0 - CMP14+EGFR  4. Other iron deficiency anemia We will check labs today, and we will follow-up at patient's next visit.  Patient will continue ferrous sulfate 325 mg every morning, and we will refill for 1 month.  - ferrous sulfate 325 (65 FE) MG tablet; Take 1 tablet (325 mg total) by mouth daily with breakfast.  Dispense: 30 tablet; Refill: 0 - CBC with Differential/Platelet - Folate - Iron and TIBC - Ferritin  5. Emotional Eating Behavior Patient will continue her medications, and we will refill Topamax 100 mg nightly and Wellbutrin SR 200 mg twice daily for 1 month.  - topiramate (TOPAMAX) 100 MG tablet; Take 1 tablet (100 mg total) by mouth at bedtime.  Dispense: 30 tablet; Refill: 0 - buPROPion (WELLBUTRIN SR) 200 MG 12 hr tablet; Take 1 tablet (200 mg total) by mouth 2 (two) times daily.  Dispense: 60 tablet; Refill: 0  6. BMI 40.0-44.9, adult (HCC)  7. Obesity, Beginning BMI 47.19 Heidi Robinson is currently in the action stage of change. As such, her goal is to maintain weight for now. She has agreed to the Category 2 Plan.   Patient's goal is to maintain her weight until she is able to work on her eating plan more closely.  Exercise goals: As is.   Behavioral modification strategies: increasing lean protein intake and emotional eating strategies.  Heidi Robinson has agreed to follow-up with our clinic in 4 weeks. She  was informed of the importance of frequent follow-up visits to maximize her success with intensive lifestyle modifications for her multiple health conditions.   Heidi Robinson was informed we would discuss her lab results at her next visit unless there is a critical issue that needs to be addressed sooner. Heidi Robinson agreed to keep her next visit at the agreed upon time to discuss these results.  Objective:   Blood pressure 120/84, pulse (!) 103, temperature 97.9 F (36.6 C), height 5\' 2"  (1.575 m), weight 238 lb (108 kg), SpO2 100%. Body mass index is 43.53 kg/m.  Lab Results  Component Value Date   CREATININE 0.77 02/11/2023   BUN 8 02/11/2023   NA 137 02/11/2023   K 4.1 02/11/2023   CL 101 02/11/2023   CO2 19 (L) 02/11/2023   Lab Results  Component Value Date   ALT 6 02/11/2023   AST 10 02/11/2023   ALKPHOS 90 02/11/2023   BILITOT <0.2 02/11/2023   Lab Results  Component Value Date   HGBA1C 5.1 02/11/2023   HGBA1C 5.4 12/18/2021   HGBA1C 4.9 06/11/2021   HGBA1C 4.8 12/13/2020   HGBA1C 5.2 07/30/2020   Lab Results  Component Value Date   INSULIN 18.1 02/11/2023   INSULIN 16.6 12/18/2021   INSULIN 18.9 06/11/2021   INSULIN 21.0 07/30/2020   INSULIN 17.3 03/11/2020   Lab Results  Component Value Date   TSH 2.310 12/13/2020   Lab Results  Component Value Date   CHOL 152 02/11/2023   HDL 40 02/11/2023   LDLCALC 95 02/11/2023   TRIG 92 02/11/2023   CHOLHDL 3.7 12/13/2020   Lab Results  Component Value Date   VD25OH 40.8 02/11/2023   VD25OH 47.5 12/18/2021   VD25OH 51.4 06/11/2021   Lab Results  Component Value Date   WBC 11.7 (H) 05/18/2022   HGB 9.1 (L) 05/18/2022   HCT 30.3 (L) 05/18/2022   MCV 63.7 (L) 05/18/2022   PLT 476 (H) 05/18/2022   Lab Results  Component Value Date   IRON 25 (L) 09/01/2019   TIBC 367 09/01/2019   FERRITIN 12 05/18/2022   Attestation Statements:   Reviewed by clinician on day of visit: allergies, medications, problem list, medical  history, surgical history, family history, social history, and previous encounter notes.   I, Burt Knack, am acting as transcriptionist for Quillian Quince, MD.  I have reviewed the above documentation for accuracy and completeness, and I agree with the above. -  Quillian Quince, MD

## 2023-09-08 ENCOUNTER — Encounter (INDEPENDENT_AMBULATORY_CARE_PROVIDER_SITE_OTHER): Payer: Self-pay | Admitting: Family Medicine

## 2023-09-08 LAB — FOLATE: Folate: 9 ng/mL (ref >3.0)

## 2023-09-08 LAB — CBC WITH DIFFERENTIAL/PLATELET
Basophils Absolute: 0.1 10*3/uL (ref 0.0–0.2)
Basos: 1 %
EOS (ABSOLUTE): 0.3 10*3/uL (ref 0.0–0.4)
Eos: 3 %
Hematocrit: 30.2 % — ABNORMAL LOW (ref 34.0–46.6)
Hemoglobin: 8.6 g/dL — ABNORMAL LOW (ref 11.1–15.9)
Immature Grans (Abs): 0.1 10*3/uL (ref 0.0–0.1)
Immature Granulocytes: 1 %
Lymphocytes Absolute: 1.6 10*3/uL (ref 0.7–3.1)
Lymphs: 17 %
MCH: 18.1 pg — ABNORMAL LOW (ref 26.6–33.0)
MCHC: 28.5 g/dL — ABNORMAL LOW (ref 31.5–35.7)
MCV: 64 fL — ABNORMAL LOW (ref 79–97)
Monocytes Absolute: 0.5 10*3/uL (ref 0.1–0.9)
Monocytes: 6 %
Neutrophils Absolute: 6.8 10*3/uL (ref 1.4–7.0)
Neutrophils: 72 %
Platelets: 499 10*3/uL — ABNORMAL HIGH (ref 150–450)
RBC: 4.74 x10E6/uL (ref 3.77–5.28)
RDW: 18.6 % — ABNORMAL HIGH (ref 11.7–15.4)
WBC: 9.4 10*3/uL (ref 3.4–10.8)

## 2023-09-08 LAB — CMP14+EGFR
ALT: 11 [IU]/L (ref 0–32)
AST: 13 [IU]/L (ref 0–40)
Albumin: 4.3 g/dL (ref 3.9–4.9)
Alkaline Phosphatase: 81 [IU]/L (ref 44–121)
BUN/Creatinine Ratio: 14 (ref 9–23)
BUN: 12 mg/dL (ref 6–24)
Bilirubin Total: <0.2 mg/dL (ref 0.0–1.2)
CO2: 20 mmol/L (ref 20–29)
Calcium: 9.6 mg/dL (ref 8.7–10.2)
Chloride: 102 mmol/L (ref 96–106)
Creatinine, Ser: 0.88 mg/dL (ref 0.57–1.00)
Globulin, Total: 2.4 g/dL (ref 1.5–4.5)
Glucose: 88 mg/dL (ref 70–99)
Potassium: 4.4 mmol/L (ref 3.5–5.2)
Sodium: 140 mmol/L (ref 134–144)
Total Protein: 6.7 g/dL (ref 6.0–8.5)
eGFR: 82 mL/min/{1.73_m2} (ref >59)

## 2023-09-08 LAB — VITAMIN D 25 HYDROXY (VIT D DEFICIENCY, FRACTURES): Vit D, 25-Hydroxy: 53.3 ng/mL (ref 30.0–100.0)

## 2023-09-08 LAB — VITAMIN B12: Vitamin B-12: 559 pg/mL (ref 232–1245)

## 2023-09-08 LAB — LIPID PANEL WITH LDL/HDL RATIO
Cholesterol, Total: 172 mg/dL (ref 100–199)
HDL: 41 mg/dL (ref >39)
LDL Chol Calc (NIH): 106 mg/dL — ABNORMAL HIGH (ref 0–99)
LDL/HDL Ratio: 2.6 {ratio} (ref 0.0–3.2)
Triglycerides: 138 mg/dL (ref 0–149)
VLDL Cholesterol Cal: 25 mg/dL (ref 5–40)

## 2023-09-08 LAB — INSULIN, RANDOM: INSULIN: 14.2 u[IU]/mL (ref 2.6–24.9)

## 2023-09-08 LAB — HEMOGLOBIN A1C
Est. average glucose Bld gHb Est-mCnc: 111 mg/dL
Hgb A1c MFr Bld: 5.5 % (ref 4.8–5.6)

## 2023-09-08 LAB — IRON AND TIBC
Iron Saturation: 3 % — CL (ref 15–55)
Iron: 13 ug/dL — ABNORMAL LOW (ref 27–159)
Total Iron Binding Capacity: 428 ug/dL (ref 250–450)
UIBC: 415 ug/dL (ref 131–425)

## 2023-09-08 LAB — FERRITIN: Ferritin: 14 ng/mL — ABNORMAL LOW (ref 15–150)

## 2023-09-17 ENCOUNTER — Other Ambulatory Visit (INDEPENDENT_AMBULATORY_CARE_PROVIDER_SITE_OTHER): Payer: Self-pay | Admitting: Family Medicine

## 2023-09-17 DIAGNOSIS — I10 Essential (primary) hypertension: Secondary | ICD-10-CM

## 2023-09-28 ENCOUNTER — Other Ambulatory Visit: Payer: Self-pay | Admitting: Physician Assistant

## 2023-09-28 ENCOUNTER — Encounter (INDEPENDENT_AMBULATORY_CARE_PROVIDER_SITE_OTHER): Payer: Self-pay | Admitting: Family Medicine

## 2023-09-28 ENCOUNTER — Ambulatory Visit (INDEPENDENT_AMBULATORY_CARE_PROVIDER_SITE_OTHER): Payer: No Typology Code available for payment source | Admitting: Family Medicine

## 2023-09-28 VITALS — BP 109/64 | HR 101 | Temp 97.7°F | Ht 62.0 in | Wt 241.0 lb

## 2023-09-28 DIAGNOSIS — I10 Essential (primary) hypertension: Secondary | ICD-10-CM | POA: Diagnosis not present

## 2023-09-28 DIAGNOSIS — F5089 Other specified eating disorder: Secondary | ICD-10-CM

## 2023-09-28 DIAGNOSIS — E669 Obesity, unspecified: Secondary | ICD-10-CM

## 2023-09-28 DIAGNOSIS — D508 Other iron deficiency anemias: Secondary | ICD-10-CM

## 2023-09-28 DIAGNOSIS — F3289 Other specified depressive episodes: Secondary | ICD-10-CM

## 2023-09-28 DIAGNOSIS — R7303 Prediabetes: Secondary | ICD-10-CM

## 2023-09-28 DIAGNOSIS — Z1231 Encounter for screening mammogram for malignant neoplasm of breast: Secondary | ICD-10-CM

## 2023-09-28 DIAGNOSIS — D509 Iron deficiency anemia, unspecified: Secondary | ICD-10-CM

## 2023-09-28 DIAGNOSIS — E559 Vitamin D deficiency, unspecified: Secondary | ICD-10-CM | POA: Diagnosis not present

## 2023-09-28 DIAGNOSIS — Z6841 Body Mass Index (BMI) 40.0 and over, adult: Secondary | ICD-10-CM

## 2023-09-28 MED ORDER — BUPROPION HCL ER (SR) 200 MG PO TB12: 200.0000 mg | ORAL_TABLET | Freq: Two times a day (BID) | ORAL | 0 refills | Status: DC

## 2023-09-28 MED ORDER — TOPIRAMATE 100 MG PO TABS: 100.0000 mg | ORAL_TABLET | Freq: Every day | ORAL | 0 refills | Status: DC

## 2023-09-28 MED ORDER — FERROUS SULFATE 325 (65 FE) MG PO TABS: 325.0000 mg | ORAL_TABLET | Freq: Every day | ORAL | 0 refills | Status: DC

## 2023-09-28 MED ORDER — METFORMIN HCL 500 MG PO TABS: 500.0000 mg | ORAL_TABLET | Freq: Three times a day (TID) | ORAL | 0 refills | Status: DC

## 2023-09-28 MED ORDER — VITAMIN D (ERGOCALCIFEROL) 1.25 MG (50000 UNIT) PO CAPS
50000.0000 [IU] | ORAL_CAPSULE | ORAL | 0 refills | Status: DC
Start: 2023-09-28 — End: 2023-11-23

## 2023-09-28 MED ORDER — LISINOPRIL-HYDROCHLOROTHIAZIDE 10-12.5 MG PO TABS: 1.0000 | ORAL_TABLET | Freq: Every day | ORAL | 0 refills | Status: DC

## 2023-09-28 NOTE — Progress Notes (Signed)
.smr  Office: [phone]  /  Fax: [phone]  WEIGHT SUMMARY AND BIOMETRICS  Anthropometric Measurements Height: 5\' 2"  (1.575 m) Weight: 241 lb (109.3 kg) BMI (Calculated): 44.07 Weight at Last Visit: 238 lb Weight Lost Since Last Visit: 0 Weight Gained Since Last Visit: 3 lb Starting Weight: 258 lb Total Weight Loss (lbs): 17 lb (7.711 kg)   Body Composition  Body Fat %: 50.8 % Fat Mass (lbs): 122.4 lbs Muscle Mass (lbs): 122.6 lbs Total Body Water (lbs): 90.6 lbs Visceral Fat Rating : 16   Other Clinical Data Fasting: No Labs: No Today's Visit #: 75 Starting Date: 12/21/17    Chief Complaint: OBESITY   History of Present Illness   The patient, with a history of prediabetes, emotional eating behavior, iron deficiency anemia, and hypertension, presents with concerns about recent weight gain. She reports a 3-pound increase over the past five weeks. Despite attempts to adhere to a dietary plan, she estimates compliance only about 10% of the time. She has been incorporating cardio and strengthening exercises into her routine, managing about 20 minutes of activity one to two times per week.  The patient has been trying to increase protein intake, but admits to struggles with consistency. She expresses frustration with the recent weight gain, as she had hoped to at least maintain her weight. She has been considering strategies to manage her eating habits, particularly with upcoming celebrations, and has decided to limit the availability of desserts at home, opting to consume them outside instead.  She also reports a change in her iron supplement regimen, having switched from a prescribed iron supplement to one from a health food store. She expresses concern that this change may have contributed to a worsening of her anemia. She denies any problems with her current medications and reports no symptoms of lightheadedness or dizziness.          PHYSICAL EXAM:  Blood  pressure 109/64, pulse (!) 101, temperature 97.7 F (36.5 C), height 5\' 2"  (1.575 m), weight 241 lb (109.3 kg), SpO2 97%. Body mass index is 44.08 kg/m.  DIAGNOSTIC DATA REVIEWED:  BMET    Component Value Date/Time   NA 140 09/07/2023 1220   K 4.4 09/07/2023 1220   CL 102 09/07/2023 1220   CO2 20 09/07/2023 1220   GLUCOSE 88 09/07/2023 1220   GLUCOSE 83 10/31/2018 1445   BUN 12 09/07/2023 1220   CREATININE 0.88 09/07/2023 1220   CREATININE 0.80 07/24/2016 1238   CALCIUM 9.6 09/07/2023 1220   GFRNONAA 79 12/13/2020 1107   GFRAA 91 12/13/2020 1107   Lab Results  Component Value Date   HGBA1C 5.5 09/07/2023   HGBA1C 5.6 07/31/2015   Lab Results  Component Value Date   INSULIN 14.2 09/07/2023   INSULIN 23.4 12/21/2017   Lab Results  Component Value Date   TSH 2.310 12/13/2020   CBC    Component Value Date/Time   WBC 9.4 09/07/2023 1220   WBC 11.7 (H) 05/18/2022 1348   WBC 11.5 (H) 07/24/2016 1238   RBC 4.74 09/07/2023 1220   RBC 4.76 05/18/2022 1348   HGB 8.6 (L) 09/07/2023 1220   HCT 30.2 (L) 09/07/2023 1220   PLT 499 (H) 09/07/2023 1220   MCV 64 (L) 09/07/2023 1220   MCH 18.1 (L) 09/07/2023 1220   MCH 19.1 (L) 05/18/2022 1348   MCHC 28.5 (L) 09/07/2023 1220   MCHC 30.0 05/18/2022 1348   RDW 18.6 (H) 09/07/2023 1220   Iron Studies  Component Value Date/Time   IRON 13 (L) 09/07/2023 1220   TIBC 428 09/07/2023 1220   FERRITIN 14 (L) 09/07/2023 1220   IRONPCTSAT 3 (LL) 09/07/2023 1220   Lipid Panel     Component Value Date/Time   CHOL 172 09/07/2023 1220   TRIG 138 09/07/2023 1220   HDL 41 09/07/2023 1220   CHOLHDL 3.7 12/13/2020 1107   CHOLHDL 3.6 07/24/2016 1238   VLDL 27 07/24/2016 1238   LDLCALC 106 (H) 09/07/2023 1220   Hepatic Function Panel     Component Value Date/Time   PROT 6.7 09/07/2023 1220   ALBUMIN 4.3 09/07/2023 1220   AST 13 09/07/2023 1220   ALT 11 09/07/2023 1220   ALKPHOS 81 09/07/2023 1220   BILITOT <0.2 09/07/2023  1220      Component Value Date/Time   TSH 2.310 12/13/2020 1107   Nutritional Lab Results  Component Value Date   VD25OH 53.3 09/07/2023   VD25OH 40.8 02/11/2023   VD25OH 47.5 12/18/2021     Assessment and Plan    Iron Deficiency Anemia Anemia has worsened, likely due to switching from prescribed iron supplements to a non-regulated alternative. Emphasized the importance of FDA-regulated supplements for accurate dosing and absorption. - Resume prescribed ferrous sulfate - Contact pharmacy if issues with prescription fulfillment - Monitor hemoglobin and iron levels  Prediabetes Prediabetes with recent weight gain, likely due to fluid retention. Discussed dietary modifications and exercise, focusing on protein intake and portion control during celebrations. - Continue dietary modifications focusing on protein intake - Continue cardio and strengthening exercises 1-2 times per week - Monitor weight and blood glucose levels  Emotional Eating Behavior Struggles with emotional eating, especially around celebrations. Discussed strategies for portion control and healthier choices, such as avoiding large quantities of dessert at home and buying single servings of treats. - Focus on portion control and mindful eating - Avoid keeping large quantities of dessert at home - Consider buying single servings of treats  Hypertension Blood pressure well-controlled with current medication regimen. No symptoms of hypotension reported. Blood pressure reading of 109/70 noted. - Continue current antihypertensive medication - Monitor blood pressure regularly  Vitamin D Deficiency Vitamin D deficiency managed with supplementation. - Continue current vitamin D supplementation  General Health Maintenance Generally in good health with no new complaints. Has a new primary care provider and has followed up with OB. Discussed the importance of regular follow-ups and medication adherence. - Schedule  follow-up appointment in January - Provide 90-day supply of all medications to cover until next visit.        She was informed of the importance of frequent follow up visits to maximize her success with intensive lifestyle modifications for her multiple health conditions.    Quillian Quince, MD

## 2023-11-23 ENCOUNTER — Encounter (INDEPENDENT_AMBULATORY_CARE_PROVIDER_SITE_OTHER): Payer: Self-pay | Admitting: Family Medicine

## 2023-11-23 ENCOUNTER — Ambulatory Visit (INDEPENDENT_AMBULATORY_CARE_PROVIDER_SITE_OTHER): Payer: No Typology Code available for payment source | Admitting: Family Medicine

## 2023-11-23 VITALS — BP 126/79 | HR 106 | Temp 98.3°F | Ht 62.0 in | Wt 239.0 lb

## 2023-11-23 DIAGNOSIS — I10 Essential (primary) hypertension: Secondary | ICD-10-CM

## 2023-11-23 DIAGNOSIS — R7303 Prediabetes: Secondary | ICD-10-CM

## 2023-11-23 DIAGNOSIS — E669 Obesity, unspecified: Secondary | ICD-10-CM

## 2023-11-23 DIAGNOSIS — D509 Iron deficiency anemia, unspecified: Secondary | ICD-10-CM | POA: Diagnosis not present

## 2023-11-23 DIAGNOSIS — D508 Other iron deficiency anemias: Secondary | ICD-10-CM

## 2023-11-23 DIAGNOSIS — F3289 Other specified depressive episodes: Secondary | ICD-10-CM

## 2023-11-23 DIAGNOSIS — E559 Vitamin D deficiency, unspecified: Secondary | ICD-10-CM | POA: Diagnosis not present

## 2023-11-23 DIAGNOSIS — Z6841 Body Mass Index (BMI) 40.0 and over, adult: Secondary | ICD-10-CM

## 2023-11-23 DIAGNOSIS — F5089 Other specified eating disorder: Secondary | ICD-10-CM

## 2023-11-23 MED ORDER — BUPROPION HCL ER (SR) 200 MG PO TB12: 200.0000 mg | ORAL_TABLET | Freq: Two times a day (BID) | ORAL | 0 refills | Status: DC

## 2023-11-23 MED ORDER — FERROUS SULFATE 325 (65 FE) MG PO TABS: 325.0000 mg | ORAL_TABLET | Freq: Every day | ORAL | 0 refills | Status: DC

## 2023-11-23 MED ORDER — TOPIRAMATE 100 MG PO TABS: 100.0000 mg | ORAL_TABLET | Freq: Every day | ORAL | 0 refills | Status: DC

## 2023-11-23 MED ORDER — LISINOPRIL-HYDROCHLOROTHIAZIDE 10-12.5 MG PO TABS: 1.0000 | ORAL_TABLET | Freq: Every day | ORAL | 0 refills | Status: DC

## 2023-11-23 MED ORDER — VITAMIN D (ERGOCALCIFEROL) 1.25 MG (50000 UNIT) PO CAPS: 50000.0000 [IU] | ORAL_CAPSULE | ORAL | 0 refills | Status: DC

## 2023-11-23 MED ORDER — METFORMIN HCL 500 MG PO TABS: 500.0000 mg | ORAL_TABLET | Freq: Three times a day (TID) | ORAL | 0 refills | Status: DC

## 2023-11-23 NOTE — Progress Notes (Signed)
 .smr  Office: [phone]  /  Fax: [phone]  WEIGHT SUMMARY AND BIOMETRICS  Anthropometric Measurements Height: 5' 2 (1.575 m) Weight: 239 lb (108.4 kg) BMI (Calculated): 43.7 Weight at Last Visit: 241 lb Weight Lost Since Last Visit: 2 lb Weight Gained Since Last Visit: 0 Starting Weight: 258 lb Total Weight Loss (lbs): 19 lb (8.618 kg)   Body Composition  Body Fat %: 50.2 % Fat Mass (lbs): 120.2 lbs Muscle Mass (lbs): 113.4 lbs Total Body Water (lbs): 88.2 lbs Visceral Fat Rating : 16   Other Clinical Data Fasting: No Labs: No Today's Visit #: 33 Starting Date: 12/21/17    Chief Complaint: OBESITY   History of Present Illness   The patient, with a history of vitamin D  deficiency, iron deficient anemia, hypertension, prediabetes, and obesity, presents for a follow-up visit. She reports a weight loss of two pounds over the past two months, which includes the holiday season. She has been adhering to a diet plan, but admits to not currently exercising. Her initial blood pressure was slightly above goal, but improved upon repeat measurement.  The patient has been dealing with significant family health issues, including a mother with bone marrow cancer and a grandmother with worsening dementia. She also mentions a granddaughter currently being tested for RSV. These stressors have led to a decrease in her motivation for personal activities, including exercise. She reports sleeping six to seven hours a night and feeling a lot of uncertainty due to various life events.  She has been taking prescribed medications, including Wellbutrin  and topiramate , without any reported issues. She has been on iron supplementation due to heavy menstrual bleeding and has noticed a decrease in ice cravings. She expresses concern about elevated platelet counts in her blood work and is attempting to schedule a follow-up with a hematologist. She has also been dealing with heavy menstrual bleeding,  for which she has been prescribed Tranexamic acid.          PHYSICAL EXAM:  Blood pressure 126/79, pulse (!) 106, temperature 98.3 F (36.8 C), height 5' 2 (1.575 m), weight 239 lb (108.4 kg), SpO2 97%. Body mass index is 43.71 kg/m.  DIAGNOSTIC DATA REVIEWED:  BMET    Component Value Date/Time   NA 140 09/07/2023 1220   K 4.4 09/07/2023 1220   CL 102 09/07/2023 1220   CO2 20 09/07/2023 1220   GLUCOSE 88 09/07/2023 1220   GLUCOSE 83 10/31/2018 1445   BUN 12 09/07/2023 1220   CREATININE 0.88 09/07/2023 1220   CREATININE 0.80 07/24/2016 1238   CALCIUM 9.6 09/07/2023 1220   GFRNONAA 79 12/13/2020 1107   GFRAA 91 12/13/2020 1107   Lab Results  Component Value Date   HGBA1C 5.5 09/07/2023   HGBA1C 5.6 07/31/2015   Lab Results  Component Value Date   INSULIN  14.2 09/07/2023   INSULIN  23.4 12/21/2017   Lab Results  Component Value Date   TSH 2.310 12/13/2020   CBC    Component Value Date/Time   WBC 9.4 09/07/2023 1220   WBC 11.7 (H) 05/18/2022 1348   WBC 11.5 (H) 07/24/2016 1238   RBC 4.74 09/07/2023 1220   RBC 4.76 05/18/2022 1348   HGB 8.6 (L) 09/07/2023 1220   HCT 30.2 (L) 09/07/2023 1220   PLT 499 (H) 09/07/2023 1220   MCV 64 (L) 09/07/2023 1220   MCH 18.1 (L) 09/07/2023 1220   MCH 19.1 (L) 05/18/2022 1348   MCHC 28.5 (L) 09/07/2023 1220   MCHC 30.0 05/18/2022 1348  RDW 18.6 (H) 09/07/2023 1220   Iron Studies    Component Value Date/Time   IRON 13 (L) 09/07/2023 1220   TIBC 428 09/07/2023 1220   FERRITIN 14 (L) 09/07/2023 1220   IRONPCTSAT 3 (LL) 09/07/2023 1220   Lipid Panel     Component Value Date/Time   CHOL 172 09/07/2023 1220   TRIG 138 09/07/2023 1220   HDL 41 09/07/2023 1220   CHOLHDL 3.7 12/13/2020 1107   CHOLHDL 3.6 07/24/2016 1238   VLDL 27 07/24/2016 1238   LDLCALC 106 (H) 09/07/2023 1220   Hepatic Function Panel     Component Value Date/Time   PROT 6.7 09/07/2023 1220   ALBUMIN 4.3 09/07/2023 1220   AST 13 09/07/2023  1220   ALT 11 09/07/2023 1220   ALKPHOS 81 09/07/2023 1220   BILITOT <0.2 09/07/2023 1220      Component Value Date/Time   TSH 2.310 12/13/2020 1107   Nutritional Lab Results  Component Value Date   VD25OH 53.3 09/07/2023   VD25OH 40.8 02/11/2023   VD25OH 47.5 12/18/2021     Assessment and Plan    Iron Deficiency Anemia Chronic iron deficiency anemia with previous pica symptoms. Concerns about elevated platelets and potential hematologic disorders. Heavy menstrual bleeding managed with tranexamic acid. Communication issues with hematologist follow-up. - Refill ferrous sulfate  325 mg daily - Encourage follow-up with hematologist - Continue tranexamic acid for heavy menstrual bleeding  Hypertension Hypertension managed with lisinopril -hydrochlorothiazide . Initial BP slightly elevated but normalized on repeat measurement. - Refill lisinopril -hydrochlorothiazide  10/12.5 mg  Prediabetes Prediabetes managed with metformin . No gastrointestinal issues reported. - Refill metformin  500 mg TID  Obesity Obesity with emotional eating behaviors. On Wellbutrin  SR and topiramate , with recent 2-pound weight loss. Following two and three eating plans but not exercising. - Refill Wellbutrin  SR 200 mg BID - Refill topiramate  100 mg at bedtime - Encourage continuation of eating plans - Discuss importance of incorporating exercise  Emotional Eating Behaviors Emotional eating exacerbated by situational stressors. On Wellbutrin  SR and topiramate . - Refill Wellbutrin  SR 200 mg BID - Refill topiramate  100 mg at bedtime  Vitamin D  Deficiency Chronic vitamin D  deficiency managed with prescription vitamin D . - Refill prescription vitamin D  50,000 IU weekly  General Health Maintenance Discussed RSV vaccination for mother and grandmother at risk. - Recommend RSV vaccination for mother and grandmother if not already vaccinated  Follow-up - Schedule follow-up appointment in February - Schedule  follow-up appointment in March.          She was informed of the importance of frequent follow up visits to maximize her success with intensive lifestyle modifications for her multiple health conditions.    Louann Penton, MD

## 2023-12-29 ENCOUNTER — Telehealth (INDEPENDENT_AMBULATORY_CARE_PROVIDER_SITE_OTHER): Payer: No Typology Code available for payment source | Admitting: Family Medicine

## 2023-12-29 ENCOUNTER — Encounter (INDEPENDENT_AMBULATORY_CARE_PROVIDER_SITE_OTHER): Payer: Self-pay

## 2023-12-29 ENCOUNTER — Telehealth (INDEPENDENT_AMBULATORY_CARE_PROVIDER_SITE_OTHER): Payer: Self-pay | Admitting: *Deleted

## 2023-12-29 DIAGNOSIS — F5089 Other specified eating disorder: Secondary | ICD-10-CM | POA: Diagnosis not present

## 2023-12-29 DIAGNOSIS — Z6841 Body Mass Index (BMI) 40.0 and over, adult: Secondary | ICD-10-CM

## 2023-12-29 DIAGNOSIS — F3289 Other specified depressive episodes: Secondary | ICD-10-CM

## 2023-12-29 DIAGNOSIS — E669 Obesity, unspecified: Secondary | ICD-10-CM

## 2023-12-29 DIAGNOSIS — E559 Vitamin D deficiency, unspecified: Secondary | ICD-10-CM | POA: Diagnosis not present

## 2023-12-29 DIAGNOSIS — I10 Essential (primary) hypertension: Secondary | ICD-10-CM

## 2023-12-29 DIAGNOSIS — D508 Other iron deficiency anemias: Secondary | ICD-10-CM

## 2023-12-29 DIAGNOSIS — D509 Iron deficiency anemia, unspecified: Secondary | ICD-10-CM | POA: Diagnosis not present

## 2023-12-29 DIAGNOSIS — R7303 Prediabetes: Secondary | ICD-10-CM

## 2023-12-29 MED ORDER — FERROUS SULFATE 325 (65 FE) MG PO TABS: 325.0000 mg | ORAL_TABLET | Freq: Every day | ORAL | 0 refills | Status: DC

## 2023-12-29 MED ORDER — VITAMIN D (ERGOCALCIFEROL) 1.25 MG (50000 UNIT) PO CAPS: 50000.0000 [IU] | ORAL_CAPSULE | ORAL | 0 refills | Status: DC

## 2023-12-29 MED ORDER — METFORMIN HCL 500 MG PO TABS: 500.0000 mg | ORAL_TABLET | Freq: Three times a day (TID) | ORAL | 0 refills | Status: DC

## 2023-12-29 MED ORDER — BUPROPION HCL ER (SR) 200 MG PO TB12: 200.0000 mg | ORAL_TABLET | Freq: Two times a day (BID) | ORAL | 0 refills | Status: DC

## 2023-12-29 MED ORDER — TOPIRAMATE 100 MG PO TABS: 100.0000 mg | ORAL_TABLET | Freq: Every day | ORAL | 0 refills | Status: DC

## 2023-12-29 MED ORDER — LISINOPRIL-HYDROCHLOROTHIAZIDE 10-12.5 MG PO TABS: 1.0000 | ORAL_TABLET | Freq: Every day | ORAL | 0 refills | Status: DC

## 2023-12-29 NOTE — Progress Notes (Signed)
 .smr  Office: [phone]  /  Fax: [phone]  WEIGHT SUMMARY AND BIOMETRICS  No data recorded No data recorded No data recorded  Chief Complaint: OBESITY   I connected with  Heidi Robinson on 12/29/23 by a video enabled telemedicine application and verified that I am speaking with the correct person using two identifiers.   I discussed the limitations of evaluation and management by telemedicine. The patient expressed understanding and agreed to proceed.   Patient is at home Physician is at home  History of Present Illness   Heidi Robinson is a 48 year old female with obesity, prediabetes, and hypertension who presents for management of her obesity and related comorbidities.  She is following her category two eating plan only about 25% of the time but has maintained her weight. She is attempting to increase her exercise, engaging in a combination of cardio, free weights, and exercise bands for about 25 minutes, four times per week.  She is working on diet and exercise to manage her prediabetes and hypertension. She is currently taking lisinopril for hypertension and metformin for prediabetes, and she requests refills for both medications.  She is taking Wellbutrin and topiramate to help with emotional eating behaviors. However, she notes increased comfort eating in the evenings from 5 to 8 PM over the last week, which she attributes to increased stress at work, where she faces difficult but important decisions.  She is on prescription vitamin D for vitamin D deficiency and reports stability, requesting a refill. She is also on ferrous sulfate for iron deficiency anemia and reports stability, requesting a refill.          PHYSICAL EXAM:  There were no vitals taken for this visit. There is no height or weight on file to calculate BMI.  DIAGNOSTIC DATA REVIEWED:  BMET    Component Value Date/Time   NA 140 09/07/2023 1220   K 4.4 09/07/2023 1220   CL 102 09/07/2023 1220   CO2  20 09/07/2023 1220   GLUCOSE 88 09/07/2023 1220   GLUCOSE 83 10/31/2018 1445   BUN 12 09/07/2023 1220   CREATININE 0.88 09/07/2023 1220   CREATININE 0.80 07/24/2016 1238   CALCIUM 9.6 09/07/2023 1220   GFRNONAA 79 12/13/2020 1107   GFRAA 91 12/13/2020 1107   Lab Results  Component Value Date   HGBA1C 5.5 09/07/2023   HGBA1C 5.6 07/31/2015   Lab Results  Component Value Date   INSULIN 14.2 09/07/2023   INSULIN 23.4 12/21/2017   Lab Results  Component Value Date   TSH 2.310 12/13/2020   CBC    Component Value Date/Time   WBC 9.4 09/07/2023 1220   WBC 11.7 (H) 05/18/2022 1348   WBC 11.5 (H) 07/24/2016 1238   RBC 4.74 09/07/2023 1220   RBC 4.76 05/18/2022 1348   HGB 8.6 (L) 09/07/2023 1220   HCT 30.2 (L) 09/07/2023 1220   PLT 499 (H) 09/07/2023 1220   MCV 64 (L) 09/07/2023 1220   MCH 18.1 (L) 09/07/2023 1220   MCH 19.1 (L) 05/18/2022 1348   MCHC 28.5 (L) 09/07/2023 1220   MCHC 30.0 05/18/2022 1348   RDW 18.6 (H) 09/07/2023 1220   Iron Studies    Component Value Date/Time   IRON 13 (L) 09/07/2023 1220   TIBC 428 09/07/2023 1220   FERRITIN 14 (L) 09/07/2023 1220   IRONPCTSAT 3 (LL) 09/07/2023 1220   Lipid Panel     Component Value Date/Time   CHOL 172 09/07/2023 1220  TRIG 138 09/07/2023 1220   HDL 41 09/07/2023 1220   CHOLHDL 3.7 12/13/2020 1107   CHOLHDL 3.6 07/24/2016 1238   VLDL 27 07/24/2016 1238   LDLCALC 106 (H) 09/07/2023 1220   Hepatic Function Panel     Component Value Date/Time   PROT 6.7 09/07/2023 1220   ALBUMIN 4.3 09/07/2023 1220   AST 13 09/07/2023 1220   ALT 11 09/07/2023 1220   ALKPHOS 81 09/07/2023 1220   BILITOT <0.2 09/07/2023 1220      Component Value Date/Time   TSH 2.310 12/13/2020 1107   Nutritional Lab Results  Component Value Date   VD25OH 53.3 09/07/2023   VD25OH 40.8 02/11/2023   VD25OH 47.5 12/18/2021     Assessment and Plan    Obesity Obesity with associated prediabetes and hypertension. Following  category two eating plan 25% of the time but has maintained weight. Engages in cardio and resistance exercises for 25 minutes, four times per week. Increased work stress contributing to emotional eating. - Continue category two eating plan and improve adherence - Increase mindfulness of stressors contributing to emotional eating  Emotional Eating Emotional eating managed with Wellbutrin and topiramate. Increased work stress leading to more comfort eating in the evenings (5-8 PM). - Refill Wellbutrin and topiramate - Identify and manage stressors contributing to emotional eating  Prediabetes Prediabetes managed with metformin. Monitoring hemoglobin A1c. - Refill metformin - Continue monitoring hemoglobin A1c  Hypertension Hypertension managed with lisinopril. Blood pressure monitoring ongoing. - Refill lisinopril - Continue monitoring blood pressure  Vitamin D Deficiency Vitamin D deficiency managed with prescription vitamin D. - Refill prescription vitamin D  Iron Deficiency Anemia Iron deficiency anemia managed with ferrous sulfate. - Refill ferrous sulfate  Follow-up - Schedule follow-up appointment in four weeks.        She was informed of the importance of frequent follow up visits to maximize her success with intensive lifestyle modifications for her multiple health conditions.    Quillian Quince, MD

## 2023-12-29 NOTE — Telephone Encounter (Signed)
 Spoke with patient going over video questions for appointment at 2:00.

## 2024-01-24 ENCOUNTER — Ambulatory Visit
Admission: RE | Admit: 2024-01-24 | Discharge: 2024-01-24 | Disposition: A | Discharge status: Home / Self Care | Payer: No Typology Code available for payment source | Source: Ambulatory Visit | Attending: Physician Assistant | Admitting: Physician Assistant

## 2024-01-24 DIAGNOSIS — Z1231 Encounter for screening mammogram for malignant neoplasm of breast: Secondary | ICD-10-CM

## 2024-01-26 ENCOUNTER — Ambulatory Visit (INDEPENDENT_AMBULATORY_CARE_PROVIDER_SITE_OTHER): Payer: No Typology Code available for payment source | Admitting: Family Medicine

## 2024-01-26 ENCOUNTER — Encounter (INDEPENDENT_AMBULATORY_CARE_PROVIDER_SITE_OTHER): Payer: Self-pay | Admitting: Family Medicine

## 2024-01-26 VITALS — BP 131/85 | HR 93 | Temp 98.3°F | Ht 62.0 in | Wt 246.0 lb

## 2024-01-26 DIAGNOSIS — D508 Other iron deficiency anemias: Secondary | ICD-10-CM

## 2024-01-26 DIAGNOSIS — Z6841 Body Mass Index (BMI) 40.0 and over, adult: Secondary | ICD-10-CM

## 2024-01-26 DIAGNOSIS — D509 Iron deficiency anemia, unspecified: Secondary | ICD-10-CM

## 2024-01-26 DIAGNOSIS — I10 Essential (primary) hypertension: Secondary | ICD-10-CM | POA: Diagnosis not present

## 2024-01-26 DIAGNOSIS — F32A Depression, unspecified: Secondary | ICD-10-CM | POA: Diagnosis not present

## 2024-01-26 DIAGNOSIS — E559 Vitamin D deficiency, unspecified: Secondary | ICD-10-CM

## 2024-01-26 DIAGNOSIS — R7303 Prediabetes: Secondary | ICD-10-CM | POA: Diagnosis not present

## 2024-01-26 DIAGNOSIS — F5089 Other specified eating disorder: Secondary | ICD-10-CM

## 2024-01-26 DIAGNOSIS — F3289 Other specified depressive episodes: Secondary | ICD-10-CM

## 2024-01-26 DIAGNOSIS — E669 Obesity, unspecified: Secondary | ICD-10-CM

## 2024-01-26 MED ORDER — ESCITALOPRAM OXALATE 10 MG PO TABS
10.0000 mg | ORAL_TABLET | Freq: Every day | ORAL | 0 refills | Status: DC
Start: 2024-01-26 — End: 2024-02-29

## 2024-01-26 NOTE — Progress Notes (Signed)
 Office: [phone]  /  Fax: [phone]  WEIGHT SUMMARY AND BIOMETRICS  Anthropometric Measurements Height: 5\' 2"  (1.575 m) Weight: 246 lb (111.6 kg) BMI (Calculated): 44.98 Weight at Last Visit: 0 Weight Lost Since Last Visit: 0 Weight Gained Since Last Visit: 7 lb Starting Weight: 258 lb Total Weight Loss (lbs): 12 lb (5.443 kg)   Body Composition  Body Fat %: 50.6 % Fat Mass (lbs): 124.8 lbs Muscle Mass (lbs): 115.6 lbs Total Body Water (lbs): 92.6 lbs Visceral Fat Rating : 16   Other Clinical Data Fasting: No Labs: No Today's Visit #: 76 Starting Date: 12/21/17    Chief Complaint: OBESITY   History of Present Illness   Heidi Robinson is a 48 year old female with obesity who presents to discuss her treatment plan and assess progress.  She has not adhered to her category two or three eating plans since her last visit two months ago, resulting in a weight gain of seven pounds. She attributes this to stress eating, with increased hunger and consumption of foods like Oreos. She exercises for about twenty-five minutes three times per week.  She has a history of emotional eating behaviors and is currently experiencing significant stress due to family issues, including her grandmother's hospitalization and her daughter's potential move back home with her granddaughter. These stressors have disrupted her routine and contributed to her emotional eating. She is being treated with topiramate and Wellbutrin.  She has a history of hypertension, which is currently controlled with lisinopril and hydrochlorothiazide.  She has a history of prediabetes and is on metformin 500 mg three times a day.  She has a history of vitamin D deficiency and is on prescription vitamin D 50,000 IU per day.  She also has a history of iron deficiency anemia and has been stable with her ferrous sulfate. She requests a refill today.          PHYSICAL EXAM:  Blood pressure 131/85, pulse 93,  temperature 98.3 F (36.8 C), height 5\' 2"  (1.575 m), weight 246 lb (111.6 kg), SpO2 98%. Body mass index is 44.99 kg/m.  DIAGNOSTIC DATA REVIEWED:  BMET    Component Value Date/Time   NA 140 09/07/2023 1220   K 4.4 09/07/2023 1220   CL 102 09/07/2023 1220   CO2 20 09/07/2023 1220   GLUCOSE 88 09/07/2023 1220   GLUCOSE 83 10/31/2018 1445   BUN 12 09/07/2023 1220   CREATININE 0.88 09/07/2023 1220   CREATININE 0.80 07/24/2016 1238   CALCIUM 9.6 09/07/2023 1220   GFRNONAA 79 12/13/2020 1107   GFRAA 91 12/13/2020 1107   Lab Results  Component Value Date   HGBA1C 5.5 09/07/2023   HGBA1C 5.6 07/31/2015   Lab Results  Component Value Date   INSULIN 14.2 09/07/2023   INSULIN 23.4 12/21/2017   Lab Results  Component Value Date   TSH 2.310 12/13/2020   CBC    Component Value Date/Time   WBC 9.4 09/07/2023 1220   WBC 11.7 (H) 05/18/2022 1348   WBC 11.5 (H) 07/24/2016 1238   RBC 4.74 09/07/2023 1220   RBC 4.76 05/18/2022 1348   HGB 8.6 (L) 09/07/2023 1220   HCT 30.2 (L) 09/07/2023 1220   PLT 499 (H) 09/07/2023 1220   MCV 64 (L) 09/07/2023 1220   MCH 18.1 (L) 09/07/2023 1220   MCH 19.1 (L) 05/18/2022 1348   MCHC 28.5 (L) 09/07/2023 1220   MCHC 30.0 05/18/2022 1348   RDW 18.6 (H) 09/07/2023 1220  Iron Studies    Component Value Date/Time   IRON 13 (L) 09/07/2023 1220   TIBC 428 09/07/2023 1220   FERRITIN 14 (L) 09/07/2023 1220   IRONPCTSAT 3 (LL) 09/07/2023 1220   Lipid Panel     Component Value Date/Time   CHOL 172 09/07/2023 1220   TRIG 138 09/07/2023 1220   HDL 41 09/07/2023 1220   CHOLHDL 3.7 12/13/2020 1107   CHOLHDL 3.6 07/24/2016 1238   VLDL 27 07/24/2016 1238   LDLCALC 106 (H) 09/07/2023 1220   Hepatic Function Panel     Component Value Date/Time   PROT 6.7 09/07/2023 1220   ALBUMIN 4.3 09/07/2023 1220   AST 13 09/07/2023 1220   ALT 11 09/07/2023 1220   ALKPHOS 81 09/07/2023 1220   BILITOT <0.2 09/07/2023 1220      Component Value  Date/Time   TSH 2.310 12/13/2020 1107   Nutritional Lab Results  Component Value Date   VD25OH 53.3 09/07/2023   VD25OH 40.8 02/11/2023   VD25OH 47.5 12/18/2021     Assessment and Plan    Depression with emotional eating behaviors She is currently on topiramate and Wellbutrin. Due to current stressors, serotonin coverage is inadequate. Discussed the benefits of adding an SSRI, specifically Lexapro, for its ease of discontinuation compared to other SSRIs like Zoloft. - Start Lexapro 10 mg daily, with the option to take it in the morning or night based on preference. - Continue Wellbutrin and topiramate. - Reassess serotonin coverage in four weeks.  Obesity She has gained seven pounds in the last two months due to stress and emotional eating. She is not adhering to her category two or three eating plans but exercises for 25 minutes three times per week. Stress has increased carbohydrate cravings, insulin response, and hunger. She acknowledges the need for portion control and smart food choices. - Increase protein intake and be mindful of simple carbohydrates. - Focus on portion control and smart food choices until the next visit.  Hypertension Blood pressure is controlled at 131/85 mmHg with lisinopril and hydrochlorothiazide. - Continue lisinopril and hydrochlorothiazide.  Prediabetes She is on metformin 500 mg three times a day. - Continue metformin 500 mg three times a day.  Vitamin D deficiency She is on prescription vitamin D 50,000 IU per day. - Continue prescription vitamin D 50,000 IU per day.  Iron deficiency anemia She is managed with ferrous sulfate. - Continue ferrous sulfate prescription.  Follow-up She is experiencing significant stressors, including family issues and potential changes in living arrangements. Emphasized the importance of self-care, including sleep, exercise, and mental health practices. Discussed strategies for setting boundaries and expectations  with her daughter to manage stress. - Follow up in four weeks to reassess weight management, mental health, and medication efficacy.       She was informed of the importance of frequent follow up visits to maximize her success with intensive lifestyle modifications for her multiple health conditions.    Quillian Quince, MD

## 2024-02-03 ENCOUNTER — Other Ambulatory Visit (INDEPENDENT_AMBULATORY_CARE_PROVIDER_SITE_OTHER): Payer: Self-pay | Admitting: Family Medicine

## 2024-02-03 DIAGNOSIS — E559 Vitamin D deficiency, unspecified: Secondary | ICD-10-CM

## 2024-02-29 ENCOUNTER — Telehealth (INDEPENDENT_AMBULATORY_CARE_PROVIDER_SITE_OTHER): Admitting: Family Medicine

## 2024-02-29 ENCOUNTER — Encounter (INDEPENDENT_AMBULATORY_CARE_PROVIDER_SITE_OTHER): Payer: Self-pay | Admitting: Family Medicine

## 2024-02-29 VITALS — Ht 62.0 in | Wt 246.0 lb

## 2024-02-29 DIAGNOSIS — F32A Depression, unspecified: Secondary | ICD-10-CM

## 2024-02-29 DIAGNOSIS — I10 Essential (primary) hypertension: Secondary | ICD-10-CM

## 2024-02-29 DIAGNOSIS — Z6841 Body Mass Index (BMI) 40.0 and over, adult: Secondary | ICD-10-CM

## 2024-02-29 DIAGNOSIS — D509 Iron deficiency anemia, unspecified: Secondary | ICD-10-CM

## 2024-02-29 DIAGNOSIS — F3289 Other specified depressive episodes: Secondary | ICD-10-CM

## 2024-02-29 DIAGNOSIS — E669 Obesity, unspecified: Secondary | ICD-10-CM

## 2024-02-29 DIAGNOSIS — E559 Vitamin D deficiency, unspecified: Secondary | ICD-10-CM

## 2024-02-29 DIAGNOSIS — D508 Other iron deficiency anemias: Secondary | ICD-10-CM

## 2024-02-29 DIAGNOSIS — R7303 Prediabetes: Secondary | ICD-10-CM | POA: Diagnosis not present

## 2024-02-29 MED ORDER — ESCITALOPRAM OXALATE 10 MG PO TABS: 10.0000 mg | ORAL_TABLET | Freq: Every day | ORAL | 0 refills | Status: DC

## 2024-02-29 MED ORDER — BUPROPION HCL ER (SR) 200 MG PO TB12: 200.0000 mg | ORAL_TABLET | Freq: Two times a day (BID) | ORAL | 0 refills | Status: DC

## 2024-02-29 MED ORDER — FERROUS SULFATE 325 (65 FE) MG PO TABS: 325.0000 mg | ORAL_TABLET | Freq: Every day | ORAL | 0 refills | Status: AC

## 2024-02-29 MED ORDER — TOPIRAMATE 100 MG PO TABS: 100.0000 mg | ORAL_TABLET | Freq: Every day | ORAL | 0 refills | Status: DC

## 2024-02-29 MED ORDER — LISINOPRIL-HYDROCHLOROTHIAZIDE 10-12.5 MG PO TABS: 1.0000 | ORAL_TABLET | Freq: Every day | ORAL | 0 refills | Status: AC

## 2024-02-29 MED ORDER — VITAMIN D (ERGOCALCIFEROL) 1.25 MG (50000 UNIT) PO CAPS: 50000.0000 [IU] | ORAL_CAPSULE | ORAL | 0 refills | Status: DC

## 2024-02-29 MED ORDER — METFORMIN HCL 500 MG PO TABS: 500.0000 mg | ORAL_TABLET | Freq: Three times a day (TID) | ORAL | 0 refills | Status: AC

## 2024-03-01 NOTE — Progress Notes (Signed)
 Office: [phone]  /  Fax: [phone]  WEIGHT SUMMARY AND BIOMETRICS  Anthropometric Measurements Height: 5\' 2"  (1.575 m) Weight: 246 lb (111.6 kg) BMI (Calculated): 44.98   No data recorded Other Clinical Data Fasting: No Labs: No Today's Visit #: 7 Starting Date: 12/21/17 Comments: Virtual Visit    Chief Complaint: OBESITY  Virtual Visit via Telephone Note  I connected with Edger Goody on 03/01/24 at  9:00 AM EDT by audiovisual telehealth and verified that I am speaking with the correct person using two identifiers.  Location: Patient: home Provider: home   I discussed the limitations, risks, security and privacy concerns of performing an evaluation and management service by AV telehealth and the availability of in person appointments. I also discussed with the patient that there may be a patient responsible charge related to this service. The patient expressed understanding and agreed to proceed.     History of Present Illness Heidi Robinson is a 48 year old female with obesity and prediabetes who presents for a follow-up on her obesity treatment plan.  She is following a combination of category two and category three eating plans, adhering to them about 20% of the time. Her weight has remained stable over the past month. She faces increased temptations at home due to her husband bringing in highly palatable foods, but she is optimistic about a decrease in these temptations as her husband is motivated to improve his health.  She has prediabetes and is managing it with diet and exercise. She takes metformin  500 mg three times daily but often misses one dose most days. She experiences occasional constipation but no gastrointestinal upset, nausea, or vomiting.  She has a history of emotional eating behaviors and is on Wellbutrin , topiramate , and Lexapro . She has been under significant stress recently, particularly last month, but feels her stress levels are stabilizing.  She reports feeling better overall and is sleeping better, averaging about seven hours per night.  She has a history of vitamin D  deficiency, treated with prescription vitamin D  50,000 IU weekly. Her most recent vitamin D  level was 53.3.  She has hypertension, controlled with diet, exercise, and lisinopril . She is not currently monitoring her blood pressure at home but has the capability to do so.  She has iron deficiency and is on ferrous sulfate . She has very heavy menstrual cycles and is under the care of an OB GYN, though her current physician is retiring soon. She follows a high-protein, iron-rich diet.      PHYSICAL EXAM:  Height 5\' 2"  (1.575 m), weight 246 lb (111.6 kg). Body mass index is 44.99 kg/m.  DIAGNOSTIC DATA REVIEWED:  BMET    Component Value Date/Time   NA 140 09/07/2023 1220   K 4.4 09/07/2023 1220   CL 102 09/07/2023 1220   CO2 20 09/07/2023 1220   GLUCOSE 88 09/07/2023 1220   GLUCOSE 83 10/31/2018 1445   BUN 12 09/07/2023 1220   CREATININE 0.88 09/07/2023 1220   CREATININE 0.80 07/24/2016 1238   CALCIUM 9.6 09/07/2023 1220   GFRNONAA 79 12/13/2020 1107   GFRAA 91 12/13/2020 1107   Lab Results  Component Value Date   HGBA1C 5.5 09/07/2023   HGBA1C 5.6 07/31/2015   Lab Results  Component Value Date   INSULIN  14.2 09/07/2023   INSULIN  23.4 12/21/2017   Lab Results  Component Value Date   TSH 2.310 12/13/2020   CBC    Component Value Date/Time   WBC 9.4 09/07/2023 1220  WBC 11.7 (H) 05/18/2022 1348   WBC 11.5 (H) 07/24/2016 1238   RBC 4.74 09/07/2023 1220   RBC 4.76 05/18/2022 1348   HGB 8.6 (L) 09/07/2023 1220   HCT 30.2 (L) 09/07/2023 1220   PLT 499 (H) 09/07/2023 1220   MCV 64 (L) 09/07/2023 1220   MCH 18.1 (L) 09/07/2023 1220   MCH 19.1 (L) 05/18/2022 1348   MCHC 28.5 (L) 09/07/2023 1220   MCHC 30.0 05/18/2022 1348   RDW 18.6 (H) 09/07/2023 1220   Iron Studies    Component Value Date/Time   IRON 13 (L) 09/07/2023 1220   TIBC  428 09/07/2023 1220   FERRITIN 14 (L) 09/07/2023 1220   IRONPCTSAT 3 (LL) 09/07/2023 1220   Lipid Panel     Component Value Date/Time   CHOL 172 09/07/2023 1220   TRIG 138 09/07/2023 1220   HDL 41 09/07/2023 1220   CHOLHDL 3.7 12/13/2020 1107   CHOLHDL 3.6 07/24/2016 1238   VLDL 27 07/24/2016 1238   LDLCALC 106 (H) 09/07/2023 1220   Hepatic Function Panel     Component Value Date/Time   PROT 6.7 09/07/2023 1220   ALBUMIN 4.3 09/07/2023 1220   AST 13 09/07/2023 1220   ALT 11 09/07/2023 1220   ALKPHOS 81 09/07/2023 1220   BILITOT <0.2 09/07/2023 1220      Component Value Date/Time   TSH 2.310 12/13/2020 1107   Nutritional Lab Results  Component Value Date   VD25OH 53.3 09/07/2023   VD25OH 40.8 02/11/2023   VD25OH 47.5 12/18/2021     Assessment and Plan Assessment & Plan Obesity Obesity management with a combination of category two and category three eating plan. Adherence is approximately 20%. Weight has been stable over the last month. Increased temptations at home due to husband's dietary habits, but potential for improvement as husband is motivated to get healthier. - Continue category two and category three eating plan - Work on decreasing temptations in the home - Increase exercise, especially as weather improves  Prediabetes Prediabetes managed with diet, exercise, and metformin  500 mg TID. Reports forgetting one dose most days. No gastrointestinal side effects reported, except mild constipation. - Refill metformin  500 mg TID - Continue diet and exercise regimen  Hypertension Hypertension controlled with diet, exercise, and lisinopril . Not currently monitoring blood pressure at home but has the capability. Advised to monitor blood pressure if experiencing lightheadedness, dizziness, or headaches. - Continue lisinopril  - Continue diet and exercise regimen - Monitor blood pressure at home, especially if symptomatic  Depression Depression managed with  Wellbutrin , topiramate , and Lexapro . Recent stress levels have decreased, and overall well-being and sleep have improved. - Refill Wellbutrin  - Refill topiramate  - Refill Lexapro   Iron Deficiency Iron deficiency managed with ferrous sulfate  and dietary modifications. Heavy menstrual cycles managed by OB/GYN, but current provider is retiring. - Continue ferrous sulfate  - Continue iron-rich diet - Find a new OB/GYN for ongoing management of heavy menstrual cycles  Vitamin D  Deficiency Vitamin D  deficiency managed with prescription vitamin D  50,000 IU weekly. Recent vitamin D  level is controlled at 53.3.    She was informed of the importance of frequent follow up visits to maximize her success with intensive lifestyle modifications for her multiple health conditions.    Jasmine Mesi, MD

## 2024-06-30 ENCOUNTER — Other Ambulatory Visit: Payer: Self-pay | Admitting: Family Medicine

## 2024-06-30 DIAGNOSIS — M79622 Pain in left upper arm: Secondary | ICD-10-CM

## 2024-07-05 ENCOUNTER — Ambulatory Visit
Admission: RE | Admit: 2024-07-05 | Discharge: 2024-07-05 | Disposition: A | Discharge status: Home / Self Care | Source: Ambulatory Visit | Attending: Family Medicine | Admitting: Family Medicine

## 2024-07-05 DIAGNOSIS — M79622 Pain in left upper arm: Secondary | ICD-10-CM

## 2024-12-12 ENCOUNTER — Other Ambulatory Visit: Payer: Self-pay | Admitting: Family Medicine

## 2024-12-12 DIAGNOSIS — Z1231 Encounter for screening mammogram for malignant neoplasm of breast: Secondary | ICD-10-CM

## 2024-12-15 ENCOUNTER — Encounter: Payer: Self-pay | Admitting: Family Medicine

## 2024-12-15 ENCOUNTER — Ambulatory Visit: Admitting: Family Medicine

## 2024-12-15 VITALS — BP 120/80 | HR 93 | Temp 98.2°F | Ht 62.0 in | Wt 247.0 lb

## 2024-12-15 DIAGNOSIS — M5416 Radiculopathy, lumbar region: Secondary | ICD-10-CM | POA: Insufficient documentation

## 2024-12-15 DIAGNOSIS — R7982 Elevated C-reactive protein (CRP): Secondary | ICD-10-CM | POA: Insufficient documentation

## 2024-12-15 DIAGNOSIS — H538 Other visual disturbances: Secondary | ICD-10-CM | POA: Insufficient documentation

## 2024-12-15 DIAGNOSIS — F3289 Other specified depressive episodes: Secondary | ICD-10-CM

## 2024-12-15 DIAGNOSIS — Z7689 Persons encountering health services in other specified circumstances: Secondary | ICD-10-CM | POA: Insufficient documentation

## 2024-12-15 DIAGNOSIS — I10 Essential (primary) hypertension: Secondary | ICD-10-CM

## 2024-12-15 MED ORDER — BUPROPION HCL ER (SR) 200 MG PO TB12: 200.0000 mg | ORAL_TABLET | Freq: Two times a day (BID) | ORAL | 1 refills | Status: AC

## 2024-12-15 MED ORDER — WEGOVY 1.5 MG PO TABS: 1.5000 mg | ORAL_TABLET | Freq: Every day | ORAL | 1 refills | Status: AC

## 2024-12-15 NOTE — Assessment & Plan Note (Signed)
 Weight gain post weight loss program discontinuation, interest in Wegovy  for weight management. - Prescribed Wegovy  tablets for weight management. - Provided coupon for Wegovy  tablets. - Scheduled follow-up in two months for weight management evaluation.

## 2024-12-15 NOTE — Assessment & Plan Note (Signed)
-   Advised to schedule appointment with existing ophthalmologist for quicker access.

## 2024-12-15 NOTE — Assessment & Plan Note (Signed)
 Well-controlled with lisinopril . - Continue lisinopril  as prescribed.

## 2024-12-15 NOTE — Assessment & Plan Note (Signed)
-   Advised to schedule appointment with orthopedic specialist for evaluation.

## 2024-12-15 NOTE — Assessment & Plan Note (Signed)
-   Referred to rheumatologist for further evaluation.

## 2024-12-15 NOTE — Progress Notes (Signed)
 I,Heidi Robinson, CMA,acting as a neurosurgeon for [employer], NP.,have documented all relevant documentation on the behalf of Heidi Creighton, NP,as directed by  Heidi Creighton, NP while in the presence of Heidi Creighton, NP.  Subjective:  Patient ID: Heidi Robinson , female    DOB: 28-Oct-1976 , 49 y.o.   MRN: [id number]  Chief Complaint  Patient presents with   Establish Care    Patient presents today to establish care. She reports she last seen a pcp in December. She reports the wasn't satisfied with the care or communication.    Referral    Patient would like to have a referral to rheumatologist. She feels like based off her labs she feels like she may have arthritis and she wanted to follow up on that but never heard back.    Back Pain    Patient reports she has been having a tingling sensation started last month. She reports it has been ongoing for a little while. She reports the tingling did shoot down her leg.     HPI Discussed the use of AI scribe software for clinical note transcription with the patient, who gave verbal consent to proceed.  History of Present Illness     Heidi Robinson is a 49 year old female who presents to establish care and address joint pain, back pain, and blurry vision.  She experiences significant joint pain, particularly throbbing in her knees when getting up, exacerbated by extreme temperatures. Previous blood work in December showed a high CRP and an abnormal speckled blood test, which was sent to [facility] for further analysis, but she has not received follow-up. She manages the pain with Tylenol  Arthritis and other pain medications.  Back pain began a month ago after attending water aerobics for three consecutive days. The pain is severe, especially with poor posture, and is accompanied by tingling radiating down her left leg. She uses ibuprofen, Tylenol , and aspirin for relief, but the pain persists. Additionally, she has a rash on her back that has not responded to  hydrocortisone cream or other treatments from her dermatologist.  Blurry vision started two weeks ago, with difficulty gaining clarity, especially in the morning and when exposed to bright lights. She experiences a sensation of pressure behind her eyes and tearing when exposed to bright light. Her last eye exam was in May, when she received glasses.  Hypertension is controlled with lisinopril  and HCTZ. She also takes Wellbutrin  200 mg twice a day for emotional eating and depression, initially prescribed for weight loss management. She has experienced weight fluctuations, having previously lost weight but recently gained back to 258 pounds. She is actively trying to lose weight through water aerobics and dietary changes.  She has had headaches but does not consider them alarming, and has not experienced eye pain. She experiences constipation, which she attributes to dehydration and manages with increased fiber intake and occasional use of Miralax .     Past Medical History:  Diagnosis Date   Allergy    Anemia    Arthritis    Asthma    Depression    Fibroids    Hypertension    Insulin  resistance    Iron deficiency anemia due to chronic blood loss    Obesity (BMI 30-39.9) 02/02/2012   Prediabetes    Sickle cell trait 06/04/2014   hemoglobin electrophoresis confirmed     Family History  Problem Relation Age of Onset   Colon polyps Mother    Hypertension Mother  Anxiety disorder Mother    Obesity Mother    Bone cancer Mother    Congestive Heart Failure Mother    Hyperlipidemia Father    Kidney disease Brother    Hypertension Maternal Grandmother    Cancer Maternal Grandmother        bladder   Hyperlipidemia Maternal Grandfather    Cancer Paternal Grandfather        prostate   Breast cancer Neg Hx    Colon cancer Neg Hx    Esophageal cancer Neg Hx    Stomach cancer Neg Hx    Rectal cancer Neg Hx     Current Medications[1]   Allergies[2]   Review of Systems   Constitutional: Negative.   HENT: Negative.    Respiratory: Negative.    Cardiovascular: Negative.   Gastrointestinal: Negative.   Genitourinary: Negative.   Musculoskeletal:  Positive for arthralgias and back pain.  Skin: Negative.   Neurological: Negative.   Psychiatric/Behavioral: Negative.       Today's Vitals   12/15/24 1058  BP: 120/80  Pulse: 93  Temp: 98.2 F (36.8 C)  TempSrc: Oral  Weight: 247 lb (112 kg)  Height: 5' 2 (1.575 m)  PainSc: 0-No pain   Body mass index is 45.18 kg/m.  Wt Readings from Last 3 Encounters:  12/15/24 247 lb (112 kg)  02/29/24 246 lb (111.6 kg)  01/26/24 246 lb (111.6 kg)    The 10-year ASCVD risk score (Arnett DK, et al., 2019) is: 3.1%   Values used to calculate the score:     Age: 72 years     Clinically relevant sex: Female     Is Non-Hispanic African American: Yes     Diabetic: No     Tobacco smoker: No     Systolic Blood Pressure: 120 mmHg     Is BP treated: Yes     HDL Cholesterol: 41 mg/dL     Total Cholesterol: 174 mg/dL  Objective:  Physical Exam Constitutional:      Appearance: Normal appearance.  HENT:     Head: Normocephalic.  Cardiovascular:     Rate and Rhythm: Normal rate and regular rhythm.     Pulses: Normal pulses.     Heart sounds: Normal heart sounds.  Pulmonary:     Effort: Pulmonary effort is normal.     Breath sounds: Normal breath sounds.  Abdominal:     General: Bowel sounds are normal.  Skin:    General: Skin is warm and dry.  Neurological:     Mental Status: She is alert and oriented to person, place, and time. Mental status is at baseline.         Assessment And Plan:   Assessment & Plan Establishing care with new doctor, encounter for  Morbid obesity (HCC) Weight gain post weight loss program discontinuation, interest in Wegovy  for weight management. - Prescribed Wegovy  tablets for weight management. - Provided coupon for Wegovy  tablets. - Scheduled follow-up in two months for  weight management evaluation. Elevated C-reactive protein (CRP) - Referred to rheumatologist for further evaluation.  Blurry vision, bilateral - Advised to schedule appointment with existing ophthalmologist for quicker access. Lumbar back pain with radiculopathy affecting left lower extremity - Advised to schedule appointment with orthopedic specialist for evaluation. Primary hypertension Well controlled with lisinopril . - Continue lisinopril  as prescribed.    Return in about 2 months (around 02/12/2025) for weight mgt.  Patient was given opportunity to ask questions. Patient verbalized understanding of the plan and  was able to repeat key elements of the plan. All questions were answered to their satisfaction.    I, Heidi Creighton, NP, have reviewed all documentation for this visit. The documentation on 12/15/2024 for the exam, diagnosis, procedures, and orders are all accurate and complete.    IF YOU HAVE BEEN REFERRED TO A SPECIALIST, IT MAY TAKE 1-2 WEEKS TO SCHEDULE/PROCESS THE REFERRAL. IF YOU HAVE NOT HEARD FROM US /SPECIALIST IN TWO WEEKS, PLEASE GIVE US  A CALL AT [phone].      [1]  Current Outpatient Medications:    albuterol  (VENTOLIN  HFA) 108 (90 Base) MCG/ACT inhaler, Inhale 2 puffs into the lungs every 6 (six) hours as needed for wheezing or shortness of breath., Disp: 18 g, Rfl: 3   ferrous sulfate  325 (65 FE) MG tablet, Take 1 tablet (325 mg total) by mouth daily with breakfast., Disp: 30 tablet, Rfl: 0   fluorouracil  (EFUDEX ) 5 % cream, Apply topically 2 (two) times daily., Disp: 40 g, Rfl: 1   lisinopril -hydrochlorothiazide  (ZESTORETIC ) 10-12.5 MG tablet, Take 1 tablet by mouth daily. Take 1 tablet by mouth daily., Disp: 30 tablet, Rfl: 0   metFORMIN  (GLUCOPHAGE ) 500 MG tablet, Take 1 tablet (500 mg total) by mouth 3 (three) times daily., Disp: 90 tablet, Rfl: 0   semaglutide -weight management (WEGOVY ) 1.5 MG tablet, Take 1 tablet (1.5 mg total) by mouth daily. Daily  in AM on an empty stomach with 4 oz of water. Do not eat or drink for 30 minutes after dose., Disp: 30 tablet, Rfl: 1   buPROPion  (WELLBUTRIN  SR) 200 MG 12 hr tablet, Take 1 tablet (200 mg total) by mouth 2 (two) times daily., Disp: 90 tablet, Rfl: 1   Multiple Vitamin (MULTIVITAMINS PO), Take by mouth., Disp: , Rfl:  [2]  Allergies Allergen Reactions   Penicillins Anaphylaxis   Food     Fish, Walnuts, Cashews, Almonds, Pecans, Blueberries, Pine nuts, Pistachios, Macadamia Nuts, Coconuts, Chestnuts   Latex     Rash, hives maybe... I don't really remember   Penicillin G Other (See Comments)

## 2025-01-03 ENCOUNTER — Ambulatory Visit: Payer: Self-pay | Admitting: Family Medicine

## 2025-01-26 ENCOUNTER — Ambulatory Visit

## 2025-02-14 ENCOUNTER — Ambulatory Visit: Payer: Self-pay | Admitting: Family Medicine
